# Patient Record
Sex: Female | Born: 1945 | Race: White | Hispanic: No | Marital: Married | Smoking: Former smoker
Health system: Southern US, Community
[De-identification: ages and names within clinical notes are randomized; demographics above are authoritative.]

## PROBLEM LIST (undated history)

## (undated) DIAGNOSIS — U071 COVID-19: Secondary | ICD-10-CM

## (undated) DIAGNOSIS — Z8489 Family history of other specified conditions: Secondary | ICD-10-CM

## (undated) DIAGNOSIS — R079 Chest pain, unspecified: Secondary | ICD-10-CM

## (undated) DIAGNOSIS — I10 Essential (primary) hypertension: Secondary | ICD-10-CM

## (undated) DIAGNOSIS — T7840XA Allergy, unspecified, initial encounter: Secondary | ICD-10-CM

## (undated) DIAGNOSIS — M1990 Unspecified osteoarthritis, unspecified site: Secondary | ICD-10-CM

## (undated) DIAGNOSIS — I351 Nonrheumatic aortic (valve) insufficiency: Secondary | ICD-10-CM

## (undated) DIAGNOSIS — R011 Cardiac murmur, unspecified: Secondary | ICD-10-CM

## (undated) DIAGNOSIS — F419 Anxiety disorder, unspecified: Secondary | ICD-10-CM

## (undated) DIAGNOSIS — F32A Depression, unspecified: Secondary | ICD-10-CM

## (undated) DIAGNOSIS — E785 Hyperlipidemia, unspecified: Secondary | ICD-10-CM

## (undated) DIAGNOSIS — K635 Polyp of colon: Secondary | ICD-10-CM

## (undated) DIAGNOSIS — K649 Unspecified hemorrhoids: Secondary | ICD-10-CM

## (undated) DIAGNOSIS — K219 Gastro-esophageal reflux disease without esophagitis: Secondary | ICD-10-CM

## (undated) DIAGNOSIS — F329 Major depressive disorder, single episode, unspecified: Secondary | ICD-10-CM

## (undated) HISTORY — DX: Depression, unspecified: F32.A

## (undated) HISTORY — DX: Essential (primary) hypertension: I10

## (undated) HISTORY — DX: Gastro-esophageal reflux disease without esophagitis: K21.9

## (undated) HISTORY — DX: COVID-19: U07.1

## (undated) HISTORY — DX: Unspecified hemorrhoids: K64.9

## (undated) HISTORY — PX: TONSILLECTOMY AND ADENOIDECTOMY: SUR1326

## (undated) HISTORY — PX: TUBAL LIGATION: SHX77

## (undated) HISTORY — DX: Major depressive disorder, single episode, unspecified: F32.9

## (undated) HISTORY — DX: Anxiety disorder, unspecified: F41.9

## (undated) HISTORY — DX: Polyp of colon: K63.5

## (undated) HISTORY — DX: Chest pain, unspecified: R07.9

## (undated) HISTORY — PX: COLONOSCOPY: SHX174

## (undated) HISTORY — DX: Cardiac murmur, unspecified: R01.1

## (undated) HISTORY — DX: Allergy, unspecified, initial encounter: T78.40XA

## (undated) HISTORY — DX: Hyperlipidemia, unspecified: E78.5

## (undated) HISTORY — DX: Nonrheumatic aortic (valve) insufficiency: I35.1

## (undated) HISTORY — PX: POLYPECTOMY: SHX149

---

## 1998-10-07 ENCOUNTER — Ambulatory Visit (HOSPITAL_COMMUNITY): Admission: RE | Admit: 1998-10-07 | Discharge: 1998-10-07 | Payer: Self-pay | Admitting: *Deleted

## 1999-07-13 ENCOUNTER — Ambulatory Visit (HOSPITAL_COMMUNITY): Admission: RE | Admit: 1999-07-13 | Discharge: 1999-07-13 | Payer: Self-pay | Admitting: Internal Medicine

## 1999-08-02 ENCOUNTER — Encounter (INDEPENDENT_AMBULATORY_CARE_PROVIDER_SITE_OTHER): Payer: Self-pay

## 1999-08-02 ENCOUNTER — Ambulatory Visit (HOSPITAL_COMMUNITY): Admission: RE | Admit: 1999-08-02 | Discharge: 1999-08-02 | Payer: Self-pay | Admitting: Gastroenterology

## 2000-04-12 ENCOUNTER — Other Ambulatory Visit: Admission: RE | Admit: 2000-04-12 | Discharge: 2000-04-12 | Payer: Self-pay | Admitting: *Deleted

## 2001-02-13 ENCOUNTER — Ambulatory Visit (HOSPITAL_COMMUNITY): Admission: RE | Admit: 2001-02-13 | Discharge: 2001-02-13 | Payer: Self-pay | Admitting: *Deleted

## 2001-06-05 ENCOUNTER — Other Ambulatory Visit: Admission: RE | Admit: 2001-06-05 | Discharge: 2001-06-05 | Payer: Self-pay | Admitting: *Deleted

## 2002-09-28 ENCOUNTER — Encounter: Payer: Self-pay | Admitting: Gastroenterology

## 2003-02-25 ENCOUNTER — Other Ambulatory Visit: Admission: RE | Admit: 2003-02-25 | Discharge: 2003-02-25 | Payer: Self-pay | Admitting: Obstetrics and Gynecology

## 2004-03-28 ENCOUNTER — Ambulatory Visit (HOSPITAL_COMMUNITY): Admission: RE | Admit: 2004-03-28 | Discharge: 2004-03-28 | Payer: Self-pay | Admitting: *Deleted

## 2004-05-11 ENCOUNTER — Ambulatory Visit (HOSPITAL_BASED_OUTPATIENT_CLINIC_OR_DEPARTMENT_OTHER): Admission: RE | Admit: 2004-05-11 | Discharge: 2004-05-11 | Payer: Self-pay | Admitting: Orthopedic Surgery

## 2004-05-11 ENCOUNTER — Ambulatory Visit (HOSPITAL_COMMUNITY): Admission: RE | Admit: 2004-05-11 | Discharge: 2004-05-11 | Payer: Self-pay | Admitting: Orthopedic Surgery

## 2005-01-10 ENCOUNTER — Encounter: Admission: RE | Admit: 2005-01-10 | Discharge: 2005-01-10 | Payer: Self-pay | Admitting: Internal Medicine

## 2005-04-23 ENCOUNTER — Ambulatory Visit (HOSPITAL_COMMUNITY): Admission: RE | Admit: 2005-04-23 | Discharge: 2005-04-23 | Payer: Self-pay | Admitting: *Deleted

## 2008-12-06 DIAGNOSIS — J329 Chronic sinusitis, unspecified: Secondary | ICD-10-CM | POA: Insufficient documentation

## 2008-12-14 ENCOUNTER — Ambulatory Visit: Payer: Self-pay | Admitting: Gastroenterology

## 2008-12-14 DIAGNOSIS — K219 Gastro-esophageal reflux disease without esophagitis: Secondary | ICD-10-CM | POA: Insufficient documentation

## 2008-12-14 DIAGNOSIS — R1013 Epigastric pain: Secondary | ICD-10-CM

## 2008-12-14 DIAGNOSIS — K3189 Other diseases of stomach and duodenum: Secondary | ICD-10-CM | POA: Insufficient documentation

## 2008-12-15 ENCOUNTER — Encounter (INDEPENDENT_AMBULATORY_CARE_PROVIDER_SITE_OTHER): Payer: Self-pay | Admitting: *Deleted

## 2009-01-06 ENCOUNTER — Telehealth: Payer: Self-pay | Admitting: Gastroenterology

## 2009-01-13 ENCOUNTER — Ambulatory Visit: Payer: Self-pay | Admitting: Gastroenterology

## 2009-01-13 ENCOUNTER — Ambulatory Visit (HOSPITAL_COMMUNITY): Admission: RE | Admit: 2009-01-13 | Discharge: 2009-01-13 | Payer: Self-pay | Admitting: Gastroenterology

## 2009-01-14 ENCOUNTER — Telehealth: Payer: Self-pay | Admitting: Gastroenterology

## 2009-07-14 ENCOUNTER — Encounter: Payer: Self-pay | Admitting: Gastroenterology

## 2010-06-22 ENCOUNTER — Encounter: Admission: RE | Admit: 2010-06-22 | Discharge: 2010-06-22 | Payer: Self-pay | Admitting: Internal Medicine

## 2010-07-03 ENCOUNTER — Ambulatory Visit: Payer: Self-pay | Admitting: Gastroenterology

## 2010-07-03 DIAGNOSIS — R197 Diarrhea, unspecified: Secondary | ICD-10-CM | POA: Insufficient documentation

## 2010-07-24 ENCOUNTER — Telehealth: Payer: Self-pay | Admitting: Gastroenterology

## 2010-08-29 ENCOUNTER — Telehealth: Payer: Self-pay | Admitting: Gastroenterology

## 2010-08-30 DIAGNOSIS — K222 Esophageal obstruction: Secondary | ICD-10-CM | POA: Insufficient documentation

## 2010-09-01 ENCOUNTER — Encounter: Payer: Self-pay | Admitting: Gastroenterology

## 2010-10-10 NOTE — Progress Notes (Signed)
Summary: Dexilant rx  Phone Note Outgoing Call   Call placed by: Joselyn Glassman,  July 24, 2010 4:30 PM Call placed to: Patient Summary of Call: Called the pt at [phone] and LM for her to call us back.   Initial call taken by: Joselyn Glassman,  July 24, 2010 4:30 PM  Follow-up for Phone Call        Called pt back, L/M for pt to return call Follow-up by: Merri Ray CMA Duncan Dull),  July 25, 2010 8:32 AM  Additional Follow-up for Phone Call Additional follow up Details #1::        Pt called back, states that Dexilant works well and wants rx sent to her pharmacy.  Additional Follow-up by: Merri Ray CMA Duncan Dull),  July 25, 2010 2:45 PM    New/Updated Medications: DEXILANT 60 MG CPDR (DEXLANSOPRAZOLE) 1 by mouth once daily Prescriptions: DEXILANT 60 MG CPDR (DEXLANSOPRAZOLE) 1 by mouth once daily  #30 x 3   Entered by:   Merri Ray CMA (AAMA)   Authorized by:   Louis Meckel MD   Signed by:   Merri Ray CMA (AAMA) on 07/25/2010   Method used:   Electronically to        [id number] (retail)       [address]       [place], [place]  [address]       Ph: [phone]       Fax: [phone]   RxID:   [id number]

## 2010-10-10 NOTE — Assessment & Plan Note (Signed)
Summary: diarrhea--ch.   History of Present Illness Visit Type: Follow-up Visit Primary GI MD: Melvia Heaps MD [facility] Primary Provider: Nila Nephew, MD Requesting Provider: Nila Nephew, MD Chief Complaint: acid reflux worse when lying down, loss of appetite, belching, gas and diarrhea daily History of Present Illness:   Erika Wu is a pleasant 65 year old Hispanic female referred at the request of Dr. Chilton Si for evaluation of dyspepsia.  She complains of frequent excess belching  accompanied  by abdominal distention, early satiety,  excess flatus and loose stools.  She characteristically has a loose stool first thing in the morning followed by several low-volume semisolid stools.  Symptoms have occurred over the past 3 months.  Weight is stable.  She is without melena or hematochezia.  A distal esophageal stricture was dilated in May, 2010.  A hyperplastic polyp was removed at colonoscopy in 2004.  She takes lexapro and Nexium and complains of excess phlegm that she coughs up after several hours of sleep.  She denies regurgitation of gastric contents.  She has mild nausea.   GI Review of Systems    Reports abdominal pain, belching, and  bloating.      Denies acid reflux, chest pain, dysphagia with liquids, dysphagia with solids, heartburn, loss of appetite, nausea, vomiting, vomiting blood, weight loss, and  weight gain.      Reports change in bowel habits and  diarrhea.     Denies anal fissure, black tarry stools, constipation, diverticulosis, fecal incontinence, heme positive stool, hemorrhoids, irritable bowel syndrome, jaundice, light color stool, liver problems, rectal bleeding, and  rectal pain.    Current Medications (verified): 1)  Lexapro 10 Mg Tabs (Escitalopram Oxalate) .... Take 1 Tablet By Mouth Once A Day 2)  Nexium 40 Mg Cpdr (Esomeprazole Magnesium) .Marland Kitchen.. 1 Tablet By Mouth Once Daily  Allergies (verified): No Known Drug Allergies  Past History:  Past Medical  History: Reviewed history from 12/14/2008 and no changes required. Current Problems:  HEMORRHOIDS (ICD-455.6) COLONIC POLYPS (ICD-211.3) DUE FOR F/U 2014,HYPERPLASTIC POLYP Depression GERD  Past Surgical History: Reviewed history from 12/14/2008 and no changes required. Tonsillectomy and Addenoidectomy  Family History: Reviewed history from 12/14/2008 and no changes required. Family History of Colon Cancer: MGM Family History of Heart Disease: Paternal side of family  Social History: Reviewed history from 12/14/2008 and no changes required. Seperated, 1 girl, 1 boy Second Grade Teacher, [employer] Travel History: [place] in January Patient is a former smoker. Quit in 1998 Alcohol Use - yes occasional Daily Caffeine Use 1 cup coffee Illicit Drug Use - no Patient gets regular exercise.  Review of Systems       The patient complains of back pain, cough, sleeping problems, and voice change.  The patient denies allergy/sinus, anemia, anxiety-new, arthritis/joint pain, blood in urine, breast changes/lumps, change in vision, confusion, coughing up blood, depression-new, fainting, fatigue, fever, headaches-new, hearing problems, heart murmur, heart rhythm changes, itching, menstrual pain, muscle pains/cramps, night sweats, nosebleeds, pregnancy symptoms, shortness of breath, skin rash, sore throat, swelling of feet/legs, swollen lymph glands, thirst - excessive , urination - excessive , urination changes/pain, urine leakage, and vision changes.         All other systems were reviewed and were negative   Vital Signs:  Patient profile:   65 year old female Height:      63 inches Weight:      133 pounds BMI:     23.65 Pulse rate:   68 / minute Pulse rhythm:  regular BP sitting:   142 / 78  (left arm)  Vitals Entered By: Erika Wu NCMA (July 03, 2010 2:29 PM)  Physical Exam  Additional Exam:  On physical exam she is a well-developed well-nourished  female  skin: anicteric HEENT: normocephalic; PEERLA; no nasal or pharyngeal abnormalities neck: supple nodes: no cervical lymphadenopathy chest: clear to ausculatation and percussion heart: no murmurs, gallops, or rubs abd: soft, nontender; BS normoactive; no abdominal masses, tenderness, organomegaly; there is no succussion splash rectal: deferred ext: no cynanosis, clubbing, edema skeletal: no deformities neuro: oriented x 3; no focal abnormalities    Impression & Recommendations:  Problem # 1:  DYSPEPSIA (ICD-536.8) Symptoms are nonspecific but could be related to her medications despite the fact that she has been on both medications for over 6 months.  Gastroparesis is also a possibility.  Recommendations #1 empirically change medications from Nexium to [facility] #2 to consider gastric emptying scan pending results of #1  Problem # 2:  DIARRHEA (ICD-787.91) Symptoms could be related to assessment #1 which would include a medication effect.  Recommendations #1 switch PPI from Nexium to [facility] and reevaluate in one week  Patient Instructions: 1)  Copy sent to : Nila Nephew, MD 2)  Use Dexilant and call us back in 1 week for progress 3)  The medication list was reviewed and reconciled.  All changed / newly prescribed medications were explained.  A complete medication list was provided to the patient / caregiver.

## 2010-10-12 NOTE — Medication Information (Signed)
Summary: Prior Auth/[employer]  Prior Auth/[employer]   Imported By: Lester [place] 09/14/2010 10:02:47  _____________________________________________________________________  External Attachment:    Type:   Image     Comment:   External Document

## 2010-10-12 NOTE — Medication Information (Signed)
Summary: Approval/[employer]  Approval/[employer]   Imported By: Lester [place] 09/14/2010 10:00:31  _____________________________________________________________________  External Attachment:    Type:   Image     Comment:   External Document

## 2010-10-12 NOTE — Progress Notes (Signed)
Summary: Medication  Phone Note Call from Patient Call back at Home Phone [phone]   Caller: Patient Call For: Dr. Arlyce Dice Reason for Call: Talk to Nurse Summary of Call: Pt states that pharmacy was supposed to send [place] an authorization form to fill out for her Dexilant, she is currently out of medication and would like samples to last untill we can get her refill authorized Initial call taken by: [place],  August 29, 2010 2:16 PM  Follow-up for Phone Call        Informed pt that we have not recieved  prior authorization. Will leave samples for pt out front. Pt is to contact her pharmacy about prior authorization fax form Follow-up by: Merri Ray CMA Duncan Dull),  August 29, 2010 3:30 PM     Appended Document: Medication    Clinical Lists Changes  Problems: Added new problem of ESOPHAGEAL STRICTURE (ICD-530.3)      Appended Document: Medication Faxed form to [employer] today  Appended Document: Medication pt approved for Dexilant from 08/11/2010 to 09/01/2011

## 2011-04-01 ENCOUNTER — Other Ambulatory Visit: Payer: Self-pay | Admitting: Gastroenterology

## 2011-05-30 ENCOUNTER — Other Ambulatory Visit: Payer: Self-pay | Admitting: Gastroenterology

## 2011-06-07 ENCOUNTER — Other Ambulatory Visit (HOSPITAL_COMMUNITY): Payer: Self-pay | Admitting: Obstetrics & Gynecology

## 2011-06-07 DIAGNOSIS — M8580 Other specified disorders of bone density and structure, unspecified site: Secondary | ICD-10-CM

## 2011-06-07 DIAGNOSIS — Z1231 Encounter for screening mammogram for malignant neoplasm of breast: Secondary | ICD-10-CM

## 2011-06-25 ENCOUNTER — Ambulatory Visit (HOSPITAL_COMMUNITY)
Admission: RE | Admit: 2011-06-25 | Discharge: 2011-06-25 | Disposition: A | Discharge status: Home / Self Care | Payer: BC Managed Care – PPO | Source: Ambulatory Visit | Attending: Obstetrics & Gynecology | Admitting: Obstetrics & Gynecology

## 2011-06-25 DIAGNOSIS — Z780 Asymptomatic menopausal state: Secondary | ICD-10-CM | POA: Insufficient documentation

## 2011-06-25 DIAGNOSIS — Z1231 Encounter for screening mammogram for malignant neoplasm of breast: Secondary | ICD-10-CM

## 2011-06-25 DIAGNOSIS — M8580 Other specified disorders of bone density and structure, unspecified site: Secondary | ICD-10-CM

## 2011-06-25 DIAGNOSIS — Z13820 Encounter for screening for osteoporosis: Secondary | ICD-10-CM | POA: Insufficient documentation

## 2011-09-21 ENCOUNTER — Ambulatory Visit
Admission: RE | Admit: 2011-09-21 | Discharge: 2011-09-21 | Disposition: A | Discharge status: Home / Self Care | Payer: BC Managed Care – PPO | Source: Ambulatory Visit | Attending: Internal Medicine | Admitting: Internal Medicine

## 2011-09-21 ENCOUNTER — Other Ambulatory Visit: Payer: Self-pay | Admitting: Internal Medicine

## 2011-09-21 DIAGNOSIS — M79673 Pain in unspecified foot: Secondary | ICD-10-CM

## 2011-10-22 ENCOUNTER — Other Ambulatory Visit: Payer: Self-pay | Admitting: Gastroenterology

## 2011-10-22 NOTE — Telephone Encounter (Signed)
Dexilant approved  From 10-05-2011 till 10-21-2012  Case number is [id number] Called pt to inform

## 2011-10-24 ENCOUNTER — Telehealth: Payer: Self-pay | Admitting: Gastroenterology

## 2011-10-24 NOTE — Telephone Encounter (Signed)
Medication already approved.

## 2012-01-11 ENCOUNTER — Telehealth: Payer: Self-pay | Admitting: Gastroenterology

## 2012-01-11 NOTE — Telephone Encounter (Signed)
Prior auth approved for Dexilant 60mg  until 01/10/2013

## 2012-01-30 ENCOUNTER — Other Ambulatory Visit: Payer: Self-pay | Admitting: Dermatology

## 2013-09-17 ENCOUNTER — Encounter: Payer: Self-pay | Admitting: Gastroenterology

## 2013-10-20 ENCOUNTER — Ambulatory Visit (INDEPENDENT_AMBULATORY_CARE_PROVIDER_SITE_OTHER): Payer: Medicare Other | Admitting: Gastroenterology

## 2013-10-20 ENCOUNTER — Encounter: Payer: Self-pay | Admitting: Gastroenterology

## 2013-10-20 VITALS — BP 120/70 | HR 64 | Ht 62.0 in | Wt 142.1 lb

## 2013-10-20 DIAGNOSIS — R197 Diarrhea, unspecified: Secondary | ICD-10-CM

## 2013-10-20 DIAGNOSIS — R011 Cardiac murmur, unspecified: Secondary | ICD-10-CM | POA: Insufficient documentation

## 2013-10-20 DIAGNOSIS — R1013 Epigastric pain: Secondary | ICD-10-CM

## 2013-10-20 DIAGNOSIS — Z860100 Personal history of colon polyps, unspecified: Secondary | ICD-10-CM | POA: Insufficient documentation

## 2013-10-20 DIAGNOSIS — K648 Other hemorrhoids: Secondary | ICD-10-CM

## 2013-10-20 DIAGNOSIS — K219 Gastro-esophageal reflux disease without esophagitis: Secondary | ICD-10-CM

## 2013-10-20 DIAGNOSIS — K3189 Other diseases of stomach and duodenum: Secondary | ICD-10-CM

## 2013-10-20 NOTE — Assessment & Plan Note (Signed)
Diastolic murmur suggestive of aortic insufficiency.  Recommendations #1 echocardiogram #2 I will send a note to Dr. Nyoka Cowden for further evaluation

## 2013-10-20 NOTE — Assessment & Plan Note (Signed)
The patient's dyspeptic symptoms could be do to ulcer or nonulcer dyspepsia.  Medication effect (dexilant) is possible.  Symptoms from chronic cholecystitis is less likely.  She may be maldigesting.  Recommendations #1 DC dexilant #2 trial of Nexium 40 mg daily #3 upper endoscopy

## 2013-10-20 NOTE — Assessment & Plan Note (Signed)
Plan followup colonoscopy 

## 2013-10-20 NOTE — Progress Notes (Signed)
_                                                                                                                History of Present Illness: 68 year old white female referred for several GI complaints including bloating, excess gas and belching.  She has a history of an esophageal of GERD for which she is taking dexilant.  She denies dysphagia.  With eating almost any food she has bloating, excess belching and gas.  She's also having frequent poorly formed stools.  She denies rectal bleeding.  When she bends over she has some discomfort in her mid epigastrium that is relieved with standing upright.  She complains of rectal discomfort and occasional blood on the toilet tissue.  Adenomatous polyps were removed in 2000.  Last colonoscopy 2004 demonstrated a diminutive polyp that was removed.  Pathology is not available.  She underwent upper endoscopy with dilation of a distal esophageal stricture and 2010.    Past Medical History  Diagnosis Date  . Anxiety   . Depression   . Colon polyps   . GERD (gastroesophageal reflux disease)    Past Surgical History  Procedure Laterality Date  . Tonsillectomy and adenoidectomy    . Tubal ligation     family history includes Colon cancer in her maternal grandmother; Diverticulosis in her maternal grandfather; Heart attack in her father; Heart disease in her father; Stomach cancer in her maternal grandmother. Current Outpatient Prescriptions  Medication Sig Dispense Refill  . clonazePAM (KLONOPIN) 0.5 MG tablet Take 0.5 mg by mouth at bedtime.       Marland Kitchen DEXILANT 60 MG capsule TAKE ONE (1) CAPSULE(S) ONCE DAILY  30 capsule  0  . escitalopram (LEXAPRO) 20 MG tablet Take 20 mg by mouth daily.        No current facility-administered medications for this visit.   Allergies as of 10/20/2013  . (No Known Allergies)    reports that she has been smoking Cigarettes.  She has been smoking about 0.00 packs per day. She has never used smokeless  tobacco. She reports that she drinks alcohol. She reports that she does not use illicit drugs.     Review of Systems: Pertinent positive and negative review of systems were noted in the above HPI section. All other review of systems were otherwise negative.  Vital signs were reviewed in today's medical record Physical Exam: General: Well developed , well nourished, no acute distress Skin: anicteric Head: Normocephalic and atraumatic Eyes:  sclerae anicteric, EOMI Ears: Normal auditory acuity Mouth: No deformity or lesions Neck: Supple, no masses or thyromegaly Lungs: Clear throughout to auscultation Heart: Regular rate and rhythm;  rubs or bruits.  There is a 5-6/8 early diastolic murmur heard best at the second right intercostal space Abdomen: Soft, non tender and non distended. No masses, hepatosplenomegaly or hernias noted. Normal Bowel sounds.  There is no succussion splash Rectal: Grade 3 hemorrhoids are present Musculoskeletal: Symmetrical with no gross deformities  Skin: No lesions on visible extremities  Pulses:  Normal pulses noted Extremities: No clubbing, cyanosis, edema or deformities noted Neurological: Alert oriented x 4, grossly nonfocal Cervical Nodes:  No significant cervical adenopathy Inguinal Nodes: No significant inguinal adenopathy Psychological:  Alert and cooperative. Normal mood and affect  See Assessment and Plan under Problem List

## 2013-10-20 NOTE — Assessment & Plan Note (Signed)
Diarrhea may be reflective of mild maldigestion or perhaps related to dexilant  Recommendations #1 fiber supplementation #2 reassess following switched to Nexium

## 2013-10-20 NOTE — Assessment & Plan Note (Signed)
Excess belching and bloating may be reflective of GERD despite dexilant.  Recommendations #1 trial of Nexium

## 2013-10-20 NOTE — Assessment & Plan Note (Signed)
Patient has symptomatic grade 3 hemorrhoids.  Plan band ligation once her other GI issues have been addressed and resolved

## 2013-10-20 NOTE — Patient Instructions (Addendum)
You have been given a separate informational sheet regarding your tobacco use, the importance of quitting and local resources to help you quit.  Ill will contact you with your Echo appointment

## 2013-10-21 ENCOUNTER — Encounter: Payer: Self-pay | Admitting: Cardiology

## 2013-10-21 ENCOUNTER — Ambulatory Visit (HOSPITAL_COMMUNITY): Payer: Medicare Other | Attending: Cardiology | Admitting: Radiology

## 2013-10-21 ENCOUNTER — Other Ambulatory Visit (HOSPITAL_COMMUNITY): Payer: Self-pay | Admitting: Radiology

## 2013-10-21 DIAGNOSIS — R011 Cardiac murmur, unspecified: Secondary | ICD-10-CM

## 2013-10-21 NOTE — Progress Notes (Signed)
Echocardiogram performed.  

## 2013-10-23 ENCOUNTER — Telehealth: Payer: Self-pay | Admitting: Gastroenterology

## 2013-10-23 NOTE — Telephone Encounter (Signed)
Pt calling for echocardiogram results. Please advise.

## 2013-10-23 NOTE — Telephone Encounter (Signed)
She has a bicuspid aortic valve.  I have notified Dr. Nyoka Cowden.  He will decide whether any further workup is required. I suggest that she contact his office.

## 2013-10-26 NOTE — Telephone Encounter (Signed)
Pt aware and knows to contact Dr. Rolly Salter office.

## 2013-11-09 ENCOUNTER — Telehealth: Payer: Self-pay | Admitting: Gastroenterology

## 2013-11-09 MED ORDER — PEG-KCL-NACL-NASULF-NA ASC-C 100 G PO SOLR: 1.0000 | Freq: Once | ORAL | Status: DC

## 2013-11-09 NOTE — Telephone Encounter (Signed)
Called and answered all questions by patient  Told pt to call back as needed

## 2013-11-11 ENCOUNTER — Ambulatory Visit (AMBULATORY_SURGERY_CENTER): Payer: Medicare Other | Admitting: Gastroenterology

## 2013-11-11 ENCOUNTER — Encounter: Payer: Self-pay | Admitting: Gastroenterology

## 2013-11-11 VITALS — BP 151/79 | HR 68 | Temp 97.8°F | Resp 19 | Ht 62.0 in | Wt 142.0 lb

## 2013-11-11 DIAGNOSIS — D126 Benign neoplasm of colon, unspecified: Secondary | ICD-10-CM

## 2013-11-11 DIAGNOSIS — K648 Other hemorrhoids: Secondary | ICD-10-CM

## 2013-11-11 DIAGNOSIS — Z86010 Personal history of colonic polyps: Secondary | ICD-10-CM

## 2013-11-11 DIAGNOSIS — K219 Gastro-esophageal reflux disease without esophagitis: Secondary | ICD-10-CM

## 2013-11-11 DIAGNOSIS — D131 Benign neoplasm of stomach: Secondary | ICD-10-CM

## 2013-11-11 MED ORDER — HYOSCYAMINE SULFATE 0.125 MG SL SUBL: 0.2500 mg | SUBLINGUAL_TABLET | SUBLINGUAL | Status: DC | PRN

## 2013-11-11 MED ORDER — SODIUM CHLORIDE 0.9 % IV SOLN: 500.0000 mL | INTRAVENOUS | Status: DC

## 2013-11-11 NOTE — Op Note (Signed)
[place]  [address] [place], [address]   COLONOSCOPY PROCEDURE REPORT  PATIENT: Roman, Sandall  MR#: [id number] BIRTHDATE: Oct 27, 1945 , 77  yrs. old GENDER: Female ENDOSCOPIST: Inda Castle, MD REFERRED [id number] Nyoka Cowden, M.D. PROCEDURE DATE:  11/11/2013 PROCEDURE:   Colonoscopy with cold biopsy polypectomy First Screening Colonoscopy - Avg.  risk and is 50 yrs.  old or older - No.  Prior Negative Screening - Now for repeat screening. N/A  History of Adenoma - Now for follow-up colonoscopy & has been > or = to 3 yrs.  Yes hx of adenoma.  Has been 3 or more years since last colonoscopy.  Polyps Removed Today? Yes. ASA CLASS:   Class II INDICATIONS:Patient's personal history of adenomatous colon polyps. 2000,  2004 MEDICATIONS: MAC sedation, administered by CRNA and propofol (Diprivan) 150mg  IV  DESCRIPTION OF PROCEDURE:   After the risks benefits and alternatives of the procedure were thoroughly explained, informed consent was obtained.  A digital rectal exam revealed internal hemorrhoids.   The LB [id number]  endoscope was introduced through the anus and advanced to the cecum, which was identified by both the appendix and ileocecal valve. No adverse events experienced.   The quality of the prep was excellent using Suprep The instrument was then slowly withdrawn as the colon was fully examined.      COLON FINDINGS: A sessile polyp measuring 2 mm in size was found in the descending colon.  A polypectomy was performed with cold forceps.   Internal hemorrhoids were found.   The colon was otherwise normal.  There was no diverticulosis, inflammation, polyps or cancers unless previously stated.  Retroflexed views revealed no abnormalities. The time to cecum=5 minutes 06 seconds. Withdrawal time=7 minutes 15 seconds.  The scope was withdrawn and the procedure completed. COMPLICATIONS: There were no complications.  ENDOSCOPIC IMPRESSION: 1.    Sessile polyp measuring 2 mm in size was found in the descending colon; polypectomy was performed with cold forceps 2.   Internal hemorrhoids 3.   The colon was otherwise normal  RECOMMENDATIONS: 1.  If the polyp(s) removed today are proven to be adenomatous (pre-cancerous) polyps, you will need a repeat colonoscopy in 5 years.  Otherwise you should continue to follow colorectal cancer screening guidelines for "routine risk" patients with colonoscopy in 10 years.  You will receive a letter within 1-2 weeks with the results of your biopsy as well as final recommendations.  Please call my office if you have not received a letter after 3 weeks. 2.  band ligation of internal hemorrhoids   eSigned:  Inda Castle, MD 11/11/2013 3:18 PM   cc:   PATIENT NAME:  Danilynn, Jemison MR#: [id number]

## 2013-11-11 NOTE — Progress Notes (Signed)
Lidocaine-40mg IV prior to Propofol InductionPropofol given over incremental dosages 

## 2013-11-11 NOTE — Op Note (Signed)
[place]  [address] [place], [address]   ENDOSCOPY PROCEDURE REPORT  PATIENT: Adonai, Helzer  MR#: [id number] BIRTHDATE: 1946-06-27 , 55  yrs. old GENDER: Female ENDOSCOPIST: Inda Castle, MD REFERRED BY:  Levin Erp, M.D. PROCEDURE DATE:  11/11/2013 PROCEDURE:  EGD, diagnostic ASA CLASS:     Class II INDICATIONS:  Dyspepsia. MEDICATIONS: There was residual sedation effect present from prior procedure, MAC sedation, administered by CRNA, propofol (Diprivan) 100mg  IV, and Simethicone 0.6cc PO TOPICAL ANESTHETIC: Cetacaine Spray  DESCRIPTION OF PROCEDURE: After the risks benefits and alternatives of the procedure were thoroughly explained, informed consent was obtained.  The LB [id number] endoscope was introduced through the mouth and advanced to the third portion of the duodenum. Without limitations.  The instrument was slowly withdrawn as the mucosa was fully examined.      In the gastric fundus there were multiple typical fundic appearing polyps measuring 2-6 mm.   The remainder of the upper endoscopy exam was otherwise normal.  Retroflexed views revealed no abnormalities.     The scope was then withdrawn from the patient and the procedure completed.  COMPLICATIONS: There were no complications. ENDOSCOPIC IMPRESSION: 1.   In the gastric fundus there were multiple typical fundic appearing polyps measuring 2-6 mm. 2.   The remainder of the upper endoscopy exam was otherwise normal  RECOMMENDATIONS: 1.  hold dexilant 2.  hyomax when necessary 3.  office visit 3-4 weeks REPEAT EXAM:  eSigned:  Inda Castle, MD 11/11/2013 3:23 PM   CC:

## 2013-11-11 NOTE — Patient Instructions (Addendum)
YOU HAD AN ENDOSCOPIC PROCEDURE TODAY AT THE [facility]: Refer to the procedure report that was given to you for any specific questions about what was found during the examination.  If the procedure report does not answer your questions, please call your gastroenterologist to clarify.  If you requested that your care partner not be given the details of your procedure findings, then the procedure report has been included in a sealed envelope for you to review at your convenience later.  YOU SHOULD EXPECT: Some feelings of bloating in the abdomen. Passage of more gas than usual.  Walking can help get rid of the air that was put into your GI tract during the procedure and reduce the bloating. If you had a lower endoscopy (such as a colonoscopy or flexible sigmoidoscopy) you may notice spotting of blood in your stool or on the toilet paper. If you underwent a bowel prep for your procedure, then you may not have a normal bowel movement for a few days.  DIET: Your first meal following the procedure should be a light meal and then it is ok to progress to your normal diet.  A half-sandwich or bowl of soup is an example of a good first meal.  Heavy or fried foods are harder to digest and may make you feel nauseous or bloated.  Likewise meals heavy in dairy and vegetables can cause extra gas to form and this can also increase the bloating.  Drink plenty of fluids but you should avoid alcoholic beverages for 24 hours.  ACTIVITY: Your care partner should take you home directly after the procedure.  You should plan to take it easy, moving slowly for the rest of the day.  You can resume normal activity the day after the procedure however you should NOT DRIVE or use heavy machinery for 24 hours (because of the sedation medicines used during the test).    SYMPTOMS TO REPORT IMMEDIATELY: A gastroenterologist can be reached at any hour.  During normal business hours, 8:30 AM to 5:00 PM Monday through Friday,  call [phone].  After hours and on weekends, please call the GI answering service at [phone] who will take a message and have the physician on call contact you.   Following lower endoscopy (colonoscopy or flexible sigmoidoscopy):  Excessive amounts of blood in the stool  Significant tenderness or worsening of abdominal pains  Swelling of the abdomen that is new, acute  Fever of 100F or higher  Following upper endoscopy (EGD)  Vomiting of blood or coffee ground material  New chest pain or pain under the shoulder blades  Painful or persistently difficult swallowing  New shortness of breath  Fever of 100F or higher  Black, tarry-looking stools  FOLLOW UP: If any biopsies were taken you will be contacted by phone or by letter within the next 1-3 weeks.  Call your gastroenterologist if you have not heard about the biopsies in 3 weeks.  Our staff will call the home number listed on your records the next business day following your procedure to check on you and address any questions or concerns that you may have at that time regarding the information given to you following your procedure. This is a courtesy call and so if there is no answer at the home number and we have not heard from you through the emergency physician on call, we will assume that you have returned to your regular daily activities without incident.  SIGNATURES/CONFIDENTIALITY: You and/or your care   partner have signed paperwork which will be entered into your electronic medical record.  These signatures attest to the fact that that the information above on your After Visit Summary has been reviewed and is understood.  Full responsibility of the confidentiality of this discharge information lies with you and/or your care-partner.  Polyps, hemorrhoids,banding-handouts given  Repeat colonoscopy will be determined by pathology.  Hold dexilant, use hyomax when needed.  Office visit in 3-4 weeks.

## 2013-11-11 NOTE — Progress Notes (Signed)
Called to room to assist during endoscopic procedure.  Patient ID and intended procedure confirmed with present staff. Received instructions for my participation in the procedure from the performing physician.  

## 2013-11-12 ENCOUNTER — Telehealth: Payer: Self-pay | Admitting: *Deleted

## 2013-11-12 NOTE — Telephone Encounter (Signed)
  Follow up Call-  Call back number 11/11/2013  Post procedure Call Back phone  # [phone]  Permission to leave phone message Yes    [facility]

## 2013-11-16 ENCOUNTER — Encounter: Payer: Self-pay | Admitting: Gastroenterology

## 2013-11-17 ENCOUNTER — Telehealth: Payer: Self-pay | Admitting: Gastroenterology

## 2013-11-17 MED ORDER — PANTOPRAZOLE SODIUM 40 MG PO TBEC: 40.0000 mg | DELAYED_RELEASE_TABLET | Freq: Two times a day (BID) | ORAL | Status: DC

## 2013-11-17 NOTE — Telephone Encounter (Signed)
She can start Protonix twice a day. Bloating and diarrhea better since holding dexilant?

## 2013-11-17 NOTE — Telephone Encounter (Signed)
Pt aware and script sent to pharmacy. Pt states the bloating is not much better but the diarrhea has stopped.

## 2013-11-17 NOTE — Telephone Encounter (Signed)
Pt states she had EGD and colon done and was told to hold the dexilant. Pt states she is having bad heartburn and burping again. She states she was given samples of Nexium but she cannot remember to take it before meals, she does not have regular eating schedule. Should pt go back on Dexilant? Please advise.

## 2013-11-24 ENCOUNTER — Ambulatory Visit (INDEPENDENT_AMBULATORY_CARE_PROVIDER_SITE_OTHER): Payer: Medicare Other | Admitting: Gastroenterology

## 2013-11-24 ENCOUNTER — Encounter: Payer: Self-pay | Admitting: Gastroenterology

## 2013-11-24 VITALS — BP 120/68 | HR 80 | Ht 62.0 in | Wt 144.2 lb

## 2013-11-24 DIAGNOSIS — K3189 Other diseases of stomach and duodenum: Secondary | ICD-10-CM

## 2013-11-24 DIAGNOSIS — R1013 Epigastric pain: Principal | ICD-10-CM

## 2013-11-24 DIAGNOSIS — K648 Other hemorrhoids: Secondary | ICD-10-CM

## 2013-11-24 DIAGNOSIS — Z86010 Personal history of colonic polyps: Secondary | ICD-10-CM

## 2013-11-24 NOTE — Assessment & Plan Note (Signed)
Patient continues to have dyspeptic symptoms primarily consisting of postprandial bloating and belching.  She seems to have had some response to Protonix.  I've instructed her to use simethicone as needed and to try to separate her drinking from eating.

## 2013-11-24 NOTE — Assessment & Plan Note (Signed)
Followup colonoscopy 2020

## 2013-11-24 NOTE — Patient Instructions (Signed)
Your first banding is scheduled on 01/05/2014 at 9:15am  Simethicone for bloating and gas

## 2013-11-24 NOTE — Assessment & Plan Note (Signed)
Plan band ligation for symptomatic hemorrhoids

## 2013-11-24 NOTE — Progress Notes (Signed)
          History of Present Illness:  Erika Wu continues to complain of immediate postprandial bloating and excess belching.  She is without pain, per se.  Diarrhea has subsided since changing from dexilant to Protonix.  Upper endoscopy was unremarkable (fundic gland polyps only).  A small adenomatous polyp was removed at colonoscopy and internal hemorrhoids were seen.    Review of Systems: Pertinent positive and negative review of systems were noted in the above HPI section. All other review of systems were otherwise negative.    Current Medications, Allergies, Past Medical History, Past Surgical History, Family History and Social History were reviewed in [name] record  Vital signs were reviewed in today's medical record. Physical Exam: General: Well developed , well nourished, no acute distress   See Assessment and Plan under Problem List

## 2014-01-01 ENCOUNTER — Telehealth: Payer: Self-pay | Admitting: Gastroenterology

## 2014-01-01 NOTE — Telephone Encounter (Signed)
Answered all questions.

## 2014-01-05 ENCOUNTER — Ambulatory Visit (INDEPENDENT_AMBULATORY_CARE_PROVIDER_SITE_OTHER): Payer: Medicare Other | Admitting: Gastroenterology

## 2014-01-05 ENCOUNTER — Encounter: Payer: Self-pay | Admitting: Gastroenterology

## 2014-01-05 VITALS — BP 150/66 | HR 60 | Ht 62.5 in | Wt 141.0 lb

## 2014-01-05 DIAGNOSIS — K648 Other hemorrhoids: Secondary | ICD-10-CM

## 2014-01-05 NOTE — Progress Notes (Signed)
PROCEDURE NOTE: The patient presents with symptomatic grade *3**  hemorrhoids, requesting rubber band ligation of his/her hemorrhoidal disease.  All risks, benefits and alternative forms of therapy were described and informed consent was obtained.   The anorectum was pre-medicated with lubricant and nitroglycerine ointment The decision was made to band the *right posterior** internal hemorrhoid, and the [place] was used to perform band ligation without complication.  Digital anorectal examination was then performed to assure proper positioning of the band, and to adjust the banded tissue as required.  The patient was discharged home without pain or other issues.  Dietary and behavioral recommendations were given and along with follow-up instructions.    The patient will return in *2** for  follow-up and possible additional banding as required. No complications were encountered and the patient tolerated the procedure well.

## 2014-01-05 NOTE — Patient Instructions (Signed)
HEMORRHOID BANDING PROCEDURE    FOLLOW-UP CARE   1. The procedure you have had should have been relatively painless since the banding of the area involved does not have nerve endings and there is no pain sensation.  The rubber band cuts off the blood supply to the hemorrhoid and the band may fall off as soon as 48 hours after the banding (the band may occasionally be seen in the toilet bowl following a bowel movement). You may notice a temporary feeling of fullness in the rectum which should respond adequately to plain Tylenol or Motrin.  2. Following the banding, avoid strenuous exercise that evening and resume full activity the next day.  A sitz bath (soaking in a warm tub) or bidet is soothing, and can be useful for cleansing the area after bowel movements.     3. To avoid constipation, take two tablespoons of natural wheat bran, natural oat bran, flax, Benefiber or any over the counter fiber supplement and increase your water intake to 7-8 glasses daily.    4. Unless you have been prescribed anorectal medication, do not put anything inside your rectum for two weeks: No suppositories, enemas, fingers, etc.  5. Occasionally, you may have more bleeding than usual after the banding procedure.  This is often from the untreated hemorrhoids rather than the treated one.  Don't be concerned if there is a tablespoon or so of blood.  If there is more blood than this, lie flat with your bottom higher than your head and apply an ice pack to the area. If the bleeding does not stop within a half an hour or if you feel faint, call our office at [phone] or go to the emergency room.  6. Problems are not common; however, if there is a substantial amount of bleeding, severe pain, chills, fever or difficulty passing urine (very rare) or other problems, you should call us at [phone] or report to the nearest emergency room.  7. Do not stay seated continuously for more than 2-3 hours for a day or two  after the procedure.  Tighten your buttock muscles 10-15 times every two hours and take 10-15 deep breaths every 1-2 hours.  Do not spend more than a few minutes on the toilet if you cannot empty your bowel; instead re-visit the toilet at a later time.  Your #2 banding is scheduled on 03/02/2014 at 8:45am

## 2014-01-06 ENCOUNTER — Telehealth: Payer: Self-pay | Admitting: Gastroenterology

## 2014-01-06 NOTE — Telephone Encounter (Signed)
Faxed back form.

## 2014-01-06 NOTE — Telephone Encounter (Signed)
Called for prior authorization on Protonix   Case ID number is [id number]

## 2014-03-02 ENCOUNTER — Encounter: Payer: Medicare Other | Admitting: Gastroenterology

## 2014-03-29 ENCOUNTER — Encounter: Payer: Self-pay | Admitting: Gastroenterology

## 2014-03-29 ENCOUNTER — Ambulatory Visit (INDEPENDENT_AMBULATORY_CARE_PROVIDER_SITE_OTHER): Payer: Medicare Other | Admitting: Gastroenterology

## 2014-03-29 VITALS — BP 120/72 | HR 68 | Ht 62.0 in | Wt 144.0 lb

## 2014-03-29 DIAGNOSIS — K648 Other hemorrhoids: Secondary | ICD-10-CM

## 2014-03-29 NOTE — Progress Notes (Signed)
PROCEDURE NOTE: The patient presents with symptomatic grade **3*  hemorrhoids, requesting rubber band ligation of his/her hemorrhoidal disease.  All risks, benefits and alternative forms of therapy were described and informed consent was obtained.   The anorectum was pre-medicated with lubricant and nitroglycerine ointment The decision was made to band the **right anterior* internal hemorrhoid, and the [facility] System was used to perform band ligation without complication.  Digital anorectal examination was then performed to assure proper positioning of the band, and to adjust the banded tissue as required.  The patient was discharged home without pain or other issues.  Dietary and behavioral recommendations were given and along with follow-up instructions.    The patient will return in *2** weeks for  follow-up and possible additional banding as required. No complications were encountered and the patient tolerated the procedure well.   

## 2014-03-29 NOTE — Patient Instructions (Signed)
HEMORRHOID BANDING PROCEDURE    FOLLOW-UP CARE   1. The procedure you have had should have been relatively painless since the banding of the area involved does not have nerve endings and there is no pain sensation.  The rubber band cuts off the blood supply to the hemorrhoid and the band may fall off as soon as 48 hours after the banding (the band may occasionally be seen in the toilet bowl following a bowel movement). You may notice a temporary feeling of fullness in the rectum which should respond adequately to plain Tylenol or Motrin.  2. Following the banding, avoid strenuous exercise that evening and resume full activity the next day.  A sitz bath (soaking in a warm tub) or bidet is soothing, and can be useful for cleansing the area after bowel movements.     3. To avoid constipation, take two tablespoons of natural wheat bran, natural oat bran, flax, Benefiber or any over the counter fiber supplement and increase your water intake to 7-8 glasses daily.    4. Unless you have been prescribed anorectal medication, do not put anything inside your rectum for two weeks: No suppositories, enemas, fingers, etc.  5. Occasionally, you may have more bleeding than usual after the banding procedure.  This is often from the untreated hemorrhoids rather than the treated one.  Don't be concerned if there is a tablespoon or so of blood.  If there is more blood than this, lie flat with your bottom higher than your head and apply an ice pack to the area. If the bleeding does not stop within a half an hour or if you feel faint, call our office at [phone] or go to the emergency room.  6. Problems are not common; however, if there is a substantial amount of bleeding, severe pain, chills, fever or difficulty passing urine (very rare) or other problems, you should call us at [phone] or report to the nearest emergency room.  7. Do not stay seated continuously for more than 2-3 hours for a day or two  after the procedure.  Tighten your buttock muscles 10-15 times every two hours and take 10-15 deep breaths every 1-2 hours.  Do not spend more than a few minutes on the toilet if you cannot empty your bowel; instead re-visit the toilet at a later time.   Your 3rd banding is scheduled on 06/04/2014 at 8:30am

## 2014-06-04 ENCOUNTER — Encounter: Payer: Medicare Other | Admitting: Gastroenterology

## 2014-06-16 ENCOUNTER — Other Ambulatory Visit (HOSPITAL_COMMUNITY): Payer: Self-pay | Admitting: Internal Medicine

## 2014-06-16 DIAGNOSIS — Z1231 Encounter for screening mammogram for malignant neoplasm of breast: Secondary | ICD-10-CM

## 2014-06-16 DIAGNOSIS — M810 Age-related osteoporosis without current pathological fracture: Secondary | ICD-10-CM

## 2014-06-21 ENCOUNTER — Ambulatory Visit (HOSPITAL_COMMUNITY)
Admission: RE | Admit: 2014-06-21 | Discharge: 2014-06-21 | Disposition: A | Discharge status: Home / Self Care | Payer: Medicare Other | Source: Ambulatory Visit | Attending: Internal Medicine | Admitting: Internal Medicine

## 2014-06-21 DIAGNOSIS — Z780 Asymptomatic menopausal state: Secondary | ICD-10-CM | POA: Insufficient documentation

## 2014-06-21 DIAGNOSIS — Z13820 Encounter for screening for osteoporosis: Secondary | ICD-10-CM | POA: Insufficient documentation

## 2014-06-21 DIAGNOSIS — Z1231 Encounter for screening mammogram for malignant neoplasm of breast: Secondary | ICD-10-CM | POA: Diagnosis not present

## 2014-06-21 DIAGNOSIS — M810 Age-related osteoporosis without current pathological fracture: Secondary | ICD-10-CM

## 2014-07-26 ENCOUNTER — Ambulatory Visit (INDEPENDENT_AMBULATORY_CARE_PROVIDER_SITE_OTHER): Payer: Medicare Other | Admitting: Gastroenterology

## 2014-07-26 ENCOUNTER — Encounter: Payer: Self-pay | Admitting: Gastroenterology

## 2014-07-26 VITALS — BP 134/62 | HR 76 | Ht 62.0 in | Wt 142.5 lb

## 2014-07-26 DIAGNOSIS — K648 Other hemorrhoids: Secondary | ICD-10-CM

## 2014-07-26 NOTE — Progress Notes (Signed)
PROCEDURE NOTE: The patient presents with symptomatic grade **3*  hemorrhoids, requesting rubber band ligation of his/her hemorrhoidal disease.  All risks, benefits and alternative forms of therapy were described and informed consent was obtained.   The anorectum was pre-medicated with lubricant and nitroglycerine ointment The decision was made to band the *left lateral** internal hemorrhoid, and the [facility] System was used to perform band ligation without complication.  Digital anorectal examination was then performed to assure proper positioning of the band, and to adjust the banded tissue as required.  The patient was discharged home without pain or other issues.  Dietary and behavioral recommendations were given and along with follow-up instructions.    The patient will return in *4** weeks for  follow-up and possible additional banding as required. No complications were encountered and the patient tolerated the procedure well.   

## 2014-07-26 NOTE — Patient Instructions (Signed)
HEMORRHOID BANDING PROCEDURE    FOLLOW-UP CARE   1. The procedure you have had should have been relatively painless since the banding of the area involved does not have nerve endings and there is no pain sensation.  The rubber band cuts off the blood supply to the hemorrhoid and the band may fall off as soon as 48 hours after the banding (the band may occasionally be seen in the toilet bowl following a bowel movement). You may notice a temporary feeling of fullness in the rectum which should respond adequately to plain Tylenol or Motrin.  2. Following the banding, avoid strenuous exercise that evening and resume full activity the next day.  A sitz bath (soaking in a warm tub) or bidet is soothing, and can be useful for cleansing the area after bowel movements.     3. To avoid constipation, take two tablespoons of natural wheat bran, natural oat bran, flax, Benefiber or any over the counter fiber supplement and increase your water intake to 7-8 glasses daily.    4. Unless you have been prescribed anorectal medication, do not put anything inside your rectum for two weeks: No suppositories, enemas, fingers, etc.  5. Occasionally, you may have more bleeding than usual after the banding procedure.  This is often from the untreated hemorrhoids rather than the treated one.  Don't be concerned if there is a tablespoon or so of blood.  If there is more blood than this, lie flat with your bottom higher than your head and apply an ice pack to the area. If the bleeding does not stop within a half an hour or if you feel faint, call our office at [phone] or go to the emergency room.  6. Problems are not common; however, if there is a substantial amount of bleeding, severe pain, chills, fever or difficulty passing urine (very rare) or other problems, you should call us at [phone] or report to the nearest emergency room.  7. Do not stay seated continuously for more than 2-3 hours for a day or two  after the procedure.  Tighten your buttock muscles 10-15 times every two hours and take 10-15 deep breaths every 1-2 hours.  Do not spend more than a few minutes on the toilet if you cannot empty your bowel; instead re-visit the toilet at a later time.   Your follow up appointment is scheduled on 09/27/2014 at 2:15pm

## 2014-09-27 ENCOUNTER — Ambulatory Visit: Payer: Medicare Other | Admitting: Gastroenterology

## 2014-11-02 ENCOUNTER — Encounter: Payer: Self-pay | Admitting: Gastroenterology

## 2014-11-02 ENCOUNTER — Ambulatory Visit (INDEPENDENT_AMBULATORY_CARE_PROVIDER_SITE_OTHER): Payer: Medicare Other | Admitting: Gastroenterology

## 2014-11-02 VITALS — BP 108/70 | HR 72 | Resp 14 | Ht 62.0 in | Wt 141.0 lb

## 2014-11-02 DIAGNOSIS — K648 Other hemorrhoids: Secondary | ICD-10-CM

## 2014-11-02 NOTE — Progress Notes (Signed)
PROCEDURE NOTE: The patient presents with symptomatic grade *3**  hemorrhoids, requesting rubber band ligation of his/her hemorrhoidal disease.  All risks, benefits and alternative forms of therapy were described and informed consent was obtained.   The anorectum was pre-medicated with lubricant and nitroglycerine ointment The decision was made to band the *lateral** internal hemorrhoid, and the [place] was used to perform band ligation without complication.  Digital anorectal examination was then performed to assure proper positioning of the band, and to adjust the banded tissue as required.  The patient was discharged home without pain or other issues.  Dietary and behavioral recommendations were given and along with follow-up instructions.    The patient will return in **4* weeks for  follow-up and possible additional banding as required. No complications were encountered and the patient tolerated the procedure well.  During hemorrhoids improved but over the past few weeks she's noticed a new hemorrhoid that is protruding.  On exam there is a grade 3 hemorrhoid in the left lateral position.

## 2014-11-02 NOTE — Patient Instructions (Signed)
HEMORRHOID BANDING PROCEDURE    FOLLOW-UP CARE   1. The procedure you have had should have been relatively painless since the banding of the area involved does not have nerve endings and there is no pain sensation.  The rubber band cuts off the blood supply to the hemorrhoid and the band may fall off as soon as 48 hours after the banding (the band may occasionally be seen in the toilet bowl following a bowel movement). You may notice a temporary feeling of fullness in the rectum which should respond adequately to plain Tylenol or Motrin.  2. Following the banding, avoid strenuous exercise that evening and resume full activity the next day.  A sitz bath (soaking in a warm tub) or bidet is soothing, and can be useful for cleansing the area after bowel movements.     3. To avoid constipation, take two tablespoons of natural wheat bran, natural oat bran, flax, Benefiber or any over the counter fiber supplement and increase your water intake to 7-8 glasses daily.    4. Unless you have been prescribed anorectal medication, do not put anything inside your rectum for two weeks: No suppositories, enemas, fingers, etc.  5. Occasionally, you may have more bleeding than usual after the banding procedure.  This is often from the untreated hemorrhoids rather than the treated one.  Don't be concerned if there is a tablespoon or so of blood.  If there is more blood than this, lie flat with your bottom higher than your head and apply an ice pack to the area. If the bleeding does not stop within a half an hour or if you feel faint, call our office at [phone] or go to the emergency room.  6. Problems are not common; however, if there is a substantial amount of bleeding, severe pain, chills, fever or difficulty passing urine (very rare) or other problems, you should call us at [phone] or report to the nearest emergency room.  7. Do not stay seated continuously for more than 2-3 hours for a day or two  after the procedure.  Tighten your buttock muscles 10-15 times every two hours and take 10-15 deep breaths every 1-2 hours.  Do not spend more than a few minutes on the toilet if you cannot empty your bowel; instead re-visit the toilet at a later time.   Your follow up appointment is scheduled on 12/08/2014 at 1:45pm

## 2014-11-08 ENCOUNTER — Other Ambulatory Visit (HOSPITAL_COMMUNITY): Payer: Self-pay | Admitting: Internal Medicine

## 2014-11-08 DIAGNOSIS — I351 Nonrheumatic aortic (valve) insufficiency: Secondary | ICD-10-CM

## 2014-11-12 ENCOUNTER — Ambulatory Visit (HOSPITAL_COMMUNITY): Payer: BC Managed Care – PPO

## 2014-12-08 ENCOUNTER — Encounter: Payer: Self-pay | Admitting: Gastroenterology

## 2014-12-08 ENCOUNTER — Ambulatory Visit (INDEPENDENT_AMBULATORY_CARE_PROVIDER_SITE_OTHER): Payer: Medicare Other | Admitting: Gastroenterology

## 2014-12-08 VITALS — BP 118/80 | HR 66 | Ht 62.0 in | Wt 140.8 lb

## 2014-12-08 DIAGNOSIS — K219 Gastro-esophageal reflux disease without esophagitis: Secondary | ICD-10-CM | POA: Diagnosis not present

## 2014-12-08 DIAGNOSIS — K648 Other hemorrhoids: Secondary | ICD-10-CM

## 2014-12-08 NOTE — Assessment & Plan Note (Signed)
Symptoms well-controlled with Protonix.  Plan to continue with the same.

## 2014-12-08 NOTE — Progress Notes (Signed)
      History of Present Illness:  Ms. Spitler has returned for follow-up of hemorrhoids and GERD.  While she has occasional prolapse of a hemorrhoid following a bowel movement she has no pain or bleeding.  The hemorrhoid easily is reduced.  She complains of excess belching upon awakening.  She is without pyrosis.  Since taking Protonix sinus infections have decreased in frequency.    Review of Systems: Pertinent positive and negative review of systems were noted in the above HPI section. All other review of systems were otherwise negative.    Current Medications, Allergies, Past Medical History, Past Surgical History, Family History and Social History were reviewed in [facility] record  Vital signs were reviewed in today's medical record. Physical Exam: General: Well developed , well nourished, no acute distress Skin: anicteric Head: Normocephalic and atraumatic Eyes:  sclerae anicteric, EOMI Ears: Normal auditory acuity Mouth: No deformity or lesions Lungs: Clear throughout to auscultation Heart: Regular rate and rhythm; no murmurs, rubs or bruits Abdomen: Soft, non tender and non distended. No masses, hepatosplenomegaly or hernias noted. Normal Bowel sounds Rectal:deferred Musculoskeletal: Symmetrical with no gross deformities  Pulses:  Normal pulses noted Extremities: No clubbing, cyanosis, edema or deformities noted Neurological: Alert oriented x 4, grossly nonfocal Psychological:  Alert and cooperative. Normal mood and affect  See Assessment and Plan under Problem List

## 2014-12-08 NOTE — Assessment & Plan Note (Signed)
Excellent response following band ligation.  P when necessary

## 2014-12-08 NOTE — Patient Instructions (Signed)
Follow up as needed

## 2015-02-04 ENCOUNTER — Encounter: Payer: Self-pay | Admitting: Gastroenterology

## 2015-03-24 ENCOUNTER — Encounter: Payer: Self-pay | Admitting: Cardiology

## 2015-03-24 ENCOUNTER — Ambulatory Visit (INDEPENDENT_AMBULATORY_CARE_PROVIDER_SITE_OTHER): Payer: Medicare Other | Admitting: Cardiology

## 2015-03-24 VITALS — BP 130/65 | HR 62 | Ht 62.0 in | Wt 137.0 lb

## 2015-03-24 DIAGNOSIS — R9431 Abnormal electrocardiogram [ECG] [EKG]: Secondary | ICD-10-CM

## 2015-03-24 DIAGNOSIS — I351 Nonrheumatic aortic (valve) insufficiency: Secondary | ICD-10-CM | POA: Insufficient documentation

## 2015-03-24 DIAGNOSIS — R0609 Other forms of dyspnea: Secondary | ICD-10-CM

## 2015-03-24 DIAGNOSIS — R0600 Dyspnea, unspecified: Secondary | ICD-10-CM | POA: Insufficient documentation

## 2015-03-24 NOTE — Patient Instructions (Addendum)
Medication Instructions:  Your physician recommends that you continue on your current medications as directed. Please refer to the Current Medication list given to you today.  Labwork: NONE  Testing/Procedures: Your physician has requested that you have an echocardiogram. Echocardiography is a painless test that uses sound waves to create images of your heart. It provides your doctor with information about the size and shape of your heart and how well your heart's chambers and valves are working. This procedure takes approximately one hour. There are no restrictions for this procedure.  Your physician has requested that you have a lexiscan myoview. For further information please visit [email or web address]. Please follow instruction sheet, as given.  A chest x-ray takes a picture of the organs and structures inside the chest, including the heart, lungs, and blood vessels. This test can show several things, including, whether the heart is enlarges; whether fluid is building up in the lungs; and whether pacemaker / defibrillator leads are still in place.  [facility]  Follow-Up: 1 YEAR OV/EKG

## 2015-03-24 NOTE — Progress Notes (Signed)
Cardiology Office Note   Date:  03/24/2015   ID:  Erika Wu, DOB [birth year], MRN [id number]  PCP:  Criselda Peaches, MD  Cardiologist: Darlin Coco MD  Chief Complaint  Patient presents with  . Palpitations      History of Present Illness: Erika Wu is a 69 y.o. female who presents for cardiology evaluation.  She is a medical patient of Dr. Levin Erp.  She is being seen for evaluation of exertional dyspnea.  She has a past history of a heart murmur.  She had an echocardiogram with results as noted below.  2D echo on 10/21/13: - Left ventricle: The cavity size was normal. Systolic function was normal. The estimated ejection fraction was in the range of 55% to 60%. Wall motion was normal; there were no regional wall motion abnormalities. Doppler parameters are consistent with abnormal left ventricular relaxation (grade 1 diastolic dysfunction). - Aortic valve: Bicuspid; normal thickness leaflets. Transvalvular velocity was within the normal range. There was no stenosis. Mild to moderate regurgitation. 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 - Ascending aorta: The ascending aorta was mildly dilated. - Mitral valve: No regurgitation. - Right ventricle: Systolic function was normal. - Pulmonary arteries: Systolic pressure was within the normal range. - Pericardium, extracardiac: There was no pericardial effusion. Impressions:  - NOrmal biventrciular function. Bicuspid aortic valve and mildly dilated ascending aorta. Over the past year she has noted some increased exertional dyspnea.  She notes it particularly walking up hills.  She denies any chest pain.  Is not been aware of any dizziness or syncope.  She has occasional palpitations which have in the past and were attributed to anxiety and stress. Her family history reveals that her father's side of the family is strongly positive for heart disease.  Her father died at age 51.   He had had prior CABG.  He died of an enlarged heart and angina pectoris.  The patient's mother is still living at age 35 and lives with [name]. Social history reveals that she quit smoking cigarettes about 10 years ago.  She does drink a moderate amount of white wine.  Past Medical History  Diagnosis Date  . Anxiety   . Depression   . Colon polyps   . GERD (gastroesophageal reflux disease)   . Hemorrhoids     Past Surgical History  Procedure Laterality Date  . Tonsillectomy and adenoidectomy    . Tubal ligation    . Colonoscopy       Current Outpatient Prescriptions  Medication Sig Dispense Refill  . clonazePAM (KLONOPIN) 0.5 MG tablet Take 0.5 mg by mouth at bedtime. Takes 2 pills before bed.    . escitalopram (LEXAPRO) 20 MG tablet Take 20 mg by mouth daily.     . pantoprazole (PROTONIX) 40 MG tablet Take 1 tablet (40 mg total) by mouth 2 (two) times daily. 60 tablet 3   No current facility-administered medications for this visit.    Allergies:   Review of patient's allergies indicates no known allergies.    Social History:  The patient  reports that she has quit smoking. Her smoking use included Cigarettes. She has never used smokeless tobacco. She reports that she drinks alcohol. She reports that she does not use illicit drugs.   Family History:  The patient's family history includes Colon cancer in her maternal grandmother; Diverticulosis in her maternal grandfather; Heart attack in her father; Heart disease in her father; Stomach cancer in  her maternal grandmother; Stroke in her paternal grandfather. There is no history of Hypertension.    ROS:  Please see the history of present illness.   Otherwise, review of systems are positive for none.   All other systems are reviewed and negative.    PHYSICAL EXAM: VS:  BP 130/65 mmHg  Pulse 62  Ht 5\' 2"  (1.575 m)  Wt 137 lb (62.143 kg)  BMI 25.05 kg/m2 , BMI Body mass index is 25.05 kg/(m^2). GEN: Well nourished,  well developed, in no acute distress HEENT: normal Neck: no JVD, carotid bruits, or masses Cardiac: RRR; there is a grade 3/6 decrescendo murmur of aortic insufficiency at the left sternal edge. Respiratory:  clear to auscultation bilaterally, normal work of breathing GI: soft, nontender, nondistended, + BS MS: no deformity or atrophy Skin: warm and dry, no rash Neuro:  Strength and sensation are intact Psych: euthymic mood, full affect   EKG:  EKG is ordered today. The ekg ordered today demonstrates normal sinus rhythm and prominent Q waves in the inferior leads suggesting possible old inferior wall myocardial infarction.   Recent Labs: No results found for requested labs within last 365 days.    Lipid Panel No results found for: CHOL, TRIG, HDL, CHOLHDL, VLDL, LDLCALC, LDLDIRECT    Wt Readings from Last 3 Encounters:  03/24/15 137 lb (62.143 kg)  12/08/14 140 lb 12.8 oz (63.866 kg)  11/02/14 141 lb (63.957 kg)      Other studies Reviewed: Additional studies/ records that were reviewed today include: Outside records from Dr. Nyoka Cowden..    ASSESSMENT AND PLAN:  1.  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 2.  Exertional dyspnea probably secondary to aortic valve disease.  Rule out coexisting ischemic heart disease.  Her EKG suggests possible old inferior wall myocardial infarction.  Disposition we will get a chest x-ray.  We will have her return for a two-dimensional echocardiogram to assess her aortic valve and LV systolic function.  We will also get a Lexiscan Myoview stress test to evaluate her for coronary disease. No new meds were prescribed. Many thanks for the opportunity to see this pleasant woman with you.  We will be in touch regarding the results of her cardiology studies.   Current medicines are reviewed at length with the patient today.  The patient does not  have concerns regarding medicines.  The following changes have been made:  no change  Labs/ tests ordered today include:  Orders Placed This Encounter  Procedures  . DG Chest 2 View  . Myocardial Perfusion Imaging  . EKG 12-Lead  . ECHOCARDIOGRAM COMPLETE       Signed, Darlin Coco MD 03/24/2015 6:07 PM    [facility] [address], [place], [place]  [address] Phone: [phone]; Fax: [phone]

## 2015-03-30 ENCOUNTER — Telehealth (HOSPITAL_COMMUNITY): Payer: Self-pay | Admitting: *Deleted

## 2015-03-30 NOTE — Telephone Encounter (Signed)
Patient given detailed instructions per Myocardial Perfusion Study Information Sheet for test on 04/04/15 at 12:30 Patient Notified to arrive 15 minutes early, and that it is imperative to arrive on time for appointment to keep from having the test rescheduled. Patient verbalized understanding. Hubbard Robinson, RN

## 2015-04-04 ENCOUNTER — Other Ambulatory Visit: Payer: Self-pay

## 2015-04-04 ENCOUNTER — Ambulatory Visit (HOSPITAL_BASED_OUTPATIENT_CLINIC_OR_DEPARTMENT_OTHER): Payer: Medicare Other

## 2015-04-04 ENCOUNTER — Ambulatory Visit (HOSPITAL_COMMUNITY): Payer: Medicare Other | Attending: Cardiology

## 2015-04-04 DIAGNOSIS — I351 Nonrheumatic aortic (valve) insufficiency: Secondary | ICD-10-CM

## 2015-04-04 DIAGNOSIS — R9431 Abnormal electrocardiogram [ECG] [EKG]: Secondary | ICD-10-CM | POA: Diagnosis not present

## 2015-04-04 DIAGNOSIS — I352 Nonrheumatic aortic (valve) stenosis with insufficiency: Secondary | ICD-10-CM | POA: Insufficient documentation

## 2015-04-04 DIAGNOSIS — I77811 Abdominal aortic ectasia: Secondary | ICD-10-CM | POA: Diagnosis not present

## 2015-04-04 DIAGNOSIS — I517 Cardiomegaly: Secondary | ICD-10-CM | POA: Diagnosis not present

## 2015-04-04 LAB — MYOCARDIAL PERFUSION IMAGING
LV dias vol: 95 mL
LV sys vol: 39 mL
Peak HR: 109 {beats}/min
RATE: 0.28
Rest HR: 62 {beats}/min
SDS: 2
SRS: 3
SSS: 5
TID: 0.82

## 2015-04-04 MED ORDER — TECHNETIUM TC 99M SESTAMIBI GENERIC - CARDIOLITE
32.5000 | Freq: Once | INTRAVENOUS | Status: AC | PRN
  Administered 2015-04-04: 33 via INTRAVENOUS

## 2015-04-04 MED ORDER — TECHNETIUM TC 99M SESTAMIBI GENERIC - CARDIOLITE
10.4000 | Freq: Once | INTRAVENOUS | Status: AC | PRN
  Administered 2015-04-04: 10 via INTRAVENOUS

## 2015-04-04 MED ORDER — REGADENOSON 0.4 MG/5ML IV SOLN
0.4000 mg | Freq: Once | INTRAVENOUS | Status: AC
  Administered 2015-04-04: 0.4 mg via INTRAVENOUS

## 2015-04-07 ENCOUNTER — Telehealth: Payer: Self-pay | Admitting: *Deleted

## 2015-04-07 NOTE — Telephone Encounter (Signed)
Advised patient and will forward to Dr Nyoka Cowden

## 2015-04-07 NOTE — Telephone Encounter (Signed)
-----   Message from Darlin Coco, MD sent at 04/05/2015  7:45 AM EDT ----- Please report.  They nuclear stress test did not show any evidence of myocardial scar or ischemia.  It was a normal stress test.  Send report to Dr. Nyoka Cowden.

## 2015-04-07 NOTE — Telephone Encounter (Signed)
-----   Message from Darlin Coco, MD sent at 04/05/2015  7:44 AM EDT ----- Please report.  The echocardiogram shows normal left ventricular systolic function.  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  There is moderate aortic insufficiency.  She does not need aortic valve surgery at this point.  Continue medical therapy.  Consider adding an Ace or ARB to help with afterload reduction for her aortic insufficiency but I would leave that up to the discretion of Dr. Zada Girt who follows her.  Please send a copy to Dr. Nyoka Cowden.  We should plan to repeat another echocardiogram in about a year to follow her aortic valve.

## 2016-02-13 ENCOUNTER — Other Ambulatory Visit: Payer: Self-pay | Admitting: Internal Medicine

## 2016-02-13 DIAGNOSIS — Z1231 Encounter for screening mammogram for malignant neoplasm of breast: Secondary | ICD-10-CM

## 2016-02-20 ENCOUNTER — Ambulatory Visit
Admission: RE | Admit: 2016-02-20 | Discharge: 2016-02-20 | Disposition: A | Discharge status: Home / Self Care | Payer: Medicare Other | Source: Ambulatory Visit | Attending: Internal Medicine | Admitting: Internal Medicine

## 2016-02-20 DIAGNOSIS — Z1231 Encounter for screening mammogram for malignant neoplasm of breast: Secondary | ICD-10-CM

## 2016-07-03 ENCOUNTER — Ambulatory Visit (INDEPENDENT_AMBULATORY_CARE_PROVIDER_SITE_OTHER): Payer: Medicare Other | Admitting: Nurse Practitioner

## 2016-07-03 ENCOUNTER — Ambulatory Visit: Payer: Medicare Other | Admitting: Nurse Practitioner

## 2016-07-03 ENCOUNTER — Encounter: Payer: Self-pay | Admitting: Nurse Practitioner

## 2016-07-03 VITALS — BP 157/81 | HR 77 | Ht 62.0 in | Wt 115.8 lb

## 2016-07-03 DIAGNOSIS — I351 Nonrheumatic aortic (valve) insufficiency: Secondary | ICD-10-CM

## 2016-07-03 DIAGNOSIS — R0789 Other chest pain: Secondary | ICD-10-CM | POA: Diagnosis not present

## 2016-07-03 DIAGNOSIS — R002 Palpitations: Secondary | ICD-10-CM | POA: Diagnosis not present

## 2016-07-03 NOTE — Progress Notes (Signed)
Office Visit    Patient Name: Erika Wu Date of Encounter: 07/03/2016  Primary Care Provider:  Criselda Peaches, MD Primary Cardiologist:  Erika Marshall, MD  will follow with P. Martinique, MD   Chief Complaint    70 year old female with a history of palpitations and moderate aortic insufficiency who presents for follow-up related to chest pain, dyspnea, and palpitations.  Past Medical History    Past Medical History:  Diagnosis Date  . Anxiety   . Chest pain    a. 03/2015 Lexiscan MV: EF 59%, no ischemia/infarct.  . Colon polyps   . Depression   . GERD (gastroesophageal reflux disease)   . Hemorrhoids   . Moderate aortic insufficiency    a. 03/2015 Echo: EF 55-60%, no rwma, mild LVH, Gr 1 DD, mild AS, mod AI (bicuspid AoV by report, poorly visualized on this study), Asc Ao diam 6mm, triv MR.   Past Surgical History:  Procedure Laterality Date  . COLONOSCOPY    . TONSILLECTOMY AND ADENOIDECTOMY    . TUBAL LIGATION      Allergies  No Known Allergies  History of Present Illness    70 year old female with the above complex past medical history including moderate aortic insufficiency, chest pain with negative Myoview in July 2016, and palpitations, GERD, anxiety, and depression. She was previously followed by Dr. Mare Wu and was last seen in July 2016 at which time she complained of dyspnea and chest pain. Echocardiogram showed stable, moderate aortic insufficiency. As noted above, Myoview was nonischemic. Over the past year, she has done relatively well though her mother, who lives with her, fell and fractured her hip in January which created a lot of life stress and anxiety for Erika Wu. In that setting, she was noticing more frequent palpitations. She did see her primary care provider and says that she had lab work done which was reportedly normal.   More recently, over the past month, she has had 3 separate episodes of chest discomfort and is also noted  increased frequency of palpitations as well as dyspnea on exertion. The first episode of chest discomfort awoke her from sleep and resolved with sitting up. She thought perhaps symptoms were related to GERD. The second episode occurred while walking and was pressure-like in character with associated dyspnea. Symptoms resolved within 15-20 minutes of rest. The most recent episode occurred while she was attending a "healing hands" session that a friend was holding. In the session, she was lying back with her eyes closed when she began to experience hard and fast palpitations with associated chest pressure and heaviness as well as dyspnea. This persisted for approximately 20 minutes prior to resolving spontaneously. Because of these symptoms, she was seen by primary care and subsequently advised to follow-up with cardiology. She has remained active and has not had any symptoms since October 9.  She denies PND, orthopnea, dizziness, syncope, edema and weight gain, or early satiety.  Home Medications    Prior to Admission medications   Medication Sig Start Date End Date Taking? Authorizing Provider  buPROPion (WELLBUTRIN XL) 150 MG 24 hr tablet Take 450 mg by mouth every morning. 06/07/16  Yes Historical Provider, MD  clonazePAM (KLONOPIN) 0.5 MG tablet Take 0.5 mg by mouth at bedtime. 05/25/16  Yes Historical Provider, MD  mirtazapine (REMERON) 30 MG tablet Take 30 mg by mouth at bedtime. 06/15/16  Yes Historical Provider, MD  pantoprazole (PROTONIX) 40 MG tablet Take 40 mg by mouth daily as  needed (acid reflux).   Yes Historical Provider, MD  temazepam (RESTORIL) 15 MG capsule Take 15 mg by mouth at bedtime. 06/08/16  Yes Historical Provider, MD    Review of Systems    As above, she has had 3 separate episodes of chest discomfort and is also noted exertional dyspnea and intermittent tachypalpitations. She denies PND, orthopnea, dizziness, syncope, edema, or early satiety. All other systems reviewed and are  otherwise negative except as noted above.  Physical Exam    VS:  BP (!) 157/81   Pulse 77   Ht 5\' 2"  (1.575 m)   Wt 115 lb 12.8 oz (52.5 kg)   BMI 21.18 kg/m  , BMI Body mass index is 21.18 kg/m. GEN: Well nourished, well developed, in no acute distress.  HEENT: normal.  Neck: Supple, no JVD, carotid bruits, or masses. Cardiac: RRR, 2/6 systolic and diastolic murmur heard loudest at the upper sternal border but noted throughout. No rubs, or gallops. No clubbing, cyanosis, edema.  Radials/DP/PT 2+ and equal bilaterally.  Respiratory:  Respirations regular and unlabored, clear to auscultation bilaterally. GI: Soft, nontender, nondistended, BS + x 4. MS: no deformity or atrophy. Skin: warm and dry, no rash. Neuro:  Strength and sensation are intact. Psych: Normal affect.  Accessory Clinical Findings    ECG - Regular sinus rhythm, 77, inferolateral q's, question biatrial enlargement. No acute ST or T changes.  Assessment & Plan    1.  Chest pressure: Patient presents following 3 episodes of chest discomfort in the past month. One occurred during sleep and reminded her of reflux symptoms, the other occurred with exertion and was associated with dyspnea, and the last occurred while lying down with her eyes closed and was associated with palpitations and dyspnea. Symptoms have been lasting about 20 minutes and resolving spontaneously. She has a prior history of chest pain with negative Myoview in 2016. She also has significant family history for premature CAD with her father dying at age 62 of a myocardial infarction. He has undergone CABG in his late 87s. I will arrange for repeat Lexiscan Myoview to rule out ischemia. She is to take aspirin 81 mg daily.  2. Moderate aortic insufficiency: This was stable on last echo in July 2016. As it has been a year since her last echo and as she has developed symptoms of dyspnea, I will repeat echocardiogram.  3. Hypertension: Patient's blood pressure  was elevated in clinic today though she notes that it is not generally elevated. I see on previous visits, it was normal. She will follow this at home for now.  4. Palpitations: Per prior notes, she has a history of palpitations but has never worn a monitor. She has significant hard and fast palpitations during an episode of chest pain and dyspnea on October 9. In that setting, I will arrange for a 30 day event monitor.  5. Disposition: Follow-up testing as outlined above. I will plan to see her back in the next 1-2 months or sooner if necessary.   Murray Hodgkins, NP 07/03/2016, 11:08 AM

## 2016-07-03 NOTE — Patient Instructions (Signed)
Testing/Procedures: Your physician has requested that you have a lexiscan myoview. For further information please visit [email or web address]. Please follow instruction sheet, as given.  Your physician has recommended that you wear an event monitor. Event monitors are medical devices that record the heart's electrical activity. Doctors most often [place] these monitors to diagnose arrhythmias. Arrhythmias are problems with the speed or rhythm of the heartbeat. The monitor is a small, portable device. You can wear one while you do your normal daily activities. This is usually used to diagnose what is causing palpitations/syncope (passing out).  Your physician has requested that you have an echocardiogram. Echocardiography is a painless test that uses sound waves to create images of your heart. It provides your doctor with information about the size and shape of your heart and how well your heart's chambers and valves are working. This procedure takes approximately one hour. There are no restrictions for this procedure.    Follow-Up: Your physician recommends that you schedule a follow-up appointment in: IN 2-3 MONTHS WITH DR Martinique .       If you need a refill on your cardiac medications before your next appointment, please call your pharmacy.

## 2016-07-11 ENCOUNTER — Ambulatory Visit (INDEPENDENT_AMBULATORY_CARE_PROVIDER_SITE_OTHER): Payer: Medicare Other

## 2016-07-11 ENCOUNTER — Encounter (HOSPITAL_COMMUNITY): Payer: Medicare Other

## 2016-07-11 ENCOUNTER — Telehealth (HOSPITAL_COMMUNITY): Payer: Self-pay

## 2016-07-11 DIAGNOSIS — I351 Nonrheumatic aortic (valve) insufficiency: Secondary | ICD-10-CM | POA: Diagnosis not present

## 2016-07-11 DIAGNOSIS — R0789 Other chest pain: Secondary | ICD-10-CM

## 2016-07-11 DIAGNOSIS — R002 Palpitations: Secondary | ICD-10-CM

## 2016-07-11 NOTE — Telephone Encounter (Signed)
Encounter complete. 

## 2016-07-13 ENCOUNTER — Telehealth: Payer: Self-pay | Admitting: Cardiology

## 2016-07-13 ENCOUNTER — Ambulatory Visit (HOSPITAL_COMMUNITY)
Admission: RE | Admit: 2016-07-13 | Discharge: 2016-07-13 | Disposition: A | Discharge status: Home / Self Care | Payer: Medicare Other | Source: Ambulatory Visit | Attending: Cardiology | Admitting: Cardiology

## 2016-07-13 ENCOUNTER — Encounter (HOSPITAL_COMMUNITY): Payer: Medicare Other

## 2016-07-13 DIAGNOSIS — Z8249 Family history of ischemic heart disease and other diseases of the circulatory system: Secondary | ICD-10-CM | POA: Diagnosis not present

## 2016-07-13 DIAGNOSIS — R002 Palpitations: Secondary | ICD-10-CM

## 2016-07-13 DIAGNOSIS — Z87891 Personal history of nicotine dependence: Secondary | ICD-10-CM | POA: Insufficient documentation

## 2016-07-13 DIAGNOSIS — R0789 Other chest pain: Secondary | ICD-10-CM | POA: Diagnosis not present

## 2016-07-13 DIAGNOSIS — I351 Nonrheumatic aortic (valve) insufficiency: Secondary | ICD-10-CM | POA: Diagnosis not present

## 2016-07-13 DIAGNOSIS — K219 Gastro-esophageal reflux disease without esophagitis: Secondary | ICD-10-CM | POA: Diagnosis not present

## 2016-07-13 DIAGNOSIS — I10 Essential (primary) hypertension: Secondary | ICD-10-CM | POA: Insufficient documentation

## 2016-07-13 LAB — MYOCARDIAL PERFUSION IMAGING
CHL CUP NUCLEAR SDS: 3
CHL CUP RESTING HR STRESS: 74 {beats}/min
CSEPPHR: 96 {beats}/min
LV dias vol: 110 mL (ref 46–106)
LV sys vol: 44 mL
SRS: 1
SSS: 4
TID: 1.13

## 2016-07-13 MED ORDER — REGADENOSON 0.4 MG/5ML IV SOLN
0.4000 mg | Freq: Once | INTRAVENOUS | Status: AC
  Administered 2016-07-13: 0.4 mg via INTRAVENOUS

## 2016-07-13 MED ORDER — TECHNETIUM TC 99M TETROFOSMIN IV KIT
9.9000 | PACK | Freq: Once | INTRAVENOUS | Status: AC | PRN
  Administered 2016-07-13: 9.9 via INTRAVENOUS
  Filled 2016-07-13: qty 10

## 2016-07-13 MED ORDER — TECHNETIUM TC 99M TETROFOSMIN IV KIT
30.9000 | PACK | Freq: Once | INTRAVENOUS | Status: AC | PRN
  Administered 2016-07-13: 30.9 via INTRAVENOUS
  Filled 2016-07-13: qty 31

## 2016-07-13 NOTE — Telephone Encounter (Signed)
Questions addressed regarding upcoming test. Pt voiced understanding and thanks.

## 2016-07-13 NOTE — Telephone Encounter (Signed)
Erika Wu is calling because she has a heart monitor and is schedule for a stress test today and is wanting to know should she keep the monitor off until after she is done with the stress tes t? Please call   Thanks

## 2016-07-17 ENCOUNTER — Encounter: Payer: Self-pay | Admitting: Physician Assistant

## 2016-07-19 ENCOUNTER — Encounter: Payer: Self-pay | Admitting: Physician Assistant

## 2016-07-19 ENCOUNTER — Ambulatory Visit (INDEPENDENT_AMBULATORY_CARE_PROVIDER_SITE_OTHER): Payer: Medicare Other | Admitting: Physician Assistant

## 2016-07-19 VITALS — BP 160/82 | HR 76 | Ht 61.2 in | Wt 116.8 lb

## 2016-07-19 DIAGNOSIS — Z01812 Encounter for preprocedural laboratory examination: Secondary | ICD-10-CM | POA: Diagnosis not present

## 2016-07-19 DIAGNOSIS — R9439 Abnormal result of other cardiovascular function study: Secondary | ICD-10-CM | POA: Diagnosis not present

## 2016-07-19 DIAGNOSIS — I351 Nonrheumatic aortic (valve) insufficiency: Secondary | ICD-10-CM | POA: Diagnosis not present

## 2016-07-19 DIAGNOSIS — I10 Essential (primary) hypertension: Secondary | ICD-10-CM

## 2016-07-19 LAB — LIPID PANEL
CHOL/HDL RATIO: 2.2 ratio (ref <5.0)
Cholesterol: 190 mg/dL (ref <200)
HDL: 86 mg/dL (ref >50)
LDL Cholesterol: 89 mg/dL
Triglycerides: 77 mg/dL (ref <150)
VLDL: 15 mg/dL (ref <30)

## 2016-07-19 LAB — CBC WITH DIFFERENTIAL/PLATELET
BASOS PCT: 0 %
Basophils Absolute: 0 cells/uL (ref 0–200)
EOS ABS: 120 {cells}/uL (ref 15–500)
Eosinophils Relative: 2 %
HEMATOCRIT: 40.5 % (ref 35.0–45.0)
HEMOGLOBIN: 13.9 g/dL (ref 11.7–15.5)
LYMPHS ABS: 1020 {cells}/uL (ref 850–3900)
Lymphocytes Relative: 17 %
MCH: 32.7 pg (ref 27.0–33.0)
MCHC: 34.3 g/dL (ref 32.0–36.0)
MCV: 95.3 fL (ref 80.0–100.0)
MONO ABS: 480 {cells}/uL (ref 200–950)
MPV: 9.6 fL (ref 7.5–12.5)
Monocytes Relative: 8 %
NEUTROS ABS: 4380 {cells}/uL (ref 1500–7800)
Neutrophils Relative %: 73 %
Platelets: 233 10*3/uL (ref 140–400)
RBC: 4.25 MIL/uL (ref 3.80–5.10)
RDW: 12.7 % (ref 11.0–15.0)
WBC: 6 10*3/uL (ref 3.8–10.8)

## 2016-07-19 LAB — COMPREHENSIVE METABOLIC PANEL
ALK PHOS: 138 U/L — AB (ref 33–130)
ALT: 21 U/L (ref 6–29)
AST: 18 U/L (ref 10–35)
Albumin: 4.3 g/dL (ref 3.6–5.1)
BUN: 12 mg/dL (ref 7–25)
CALCIUM: 9.2 mg/dL (ref 8.6–10.4)
CO2: 24 mmol/L (ref 20–31)
Chloride: 109 mmol/L (ref 98–110)
Creat: 0.62 mg/dL (ref 0.50–0.99)
GLUCOSE: 93 mg/dL (ref 65–99)
POTASSIUM: 4.6 mmol/L (ref 3.5–5.3)
Sodium: 141 mmol/L (ref 135–146)
TOTAL PROTEIN: 6.4 g/dL (ref 6.1–8.1)
Total Bilirubin: 0.6 mg/dL (ref 0.2–1.2)

## 2016-07-19 LAB — PROTIME-INR
INR: 1
PROTHROMBIN TIME: 10.4 s (ref 9.0–11.5)

## 2016-07-19 MED ORDER — METOPROLOL TARTRATE 25 MG PO TABS: 25.0000 mg | ORAL_TABLET | Freq: Two times a day (BID) | ORAL | 3 refills | Status: DC

## 2016-07-19 MED ORDER — ASPIRIN EC 81 MG PO TBEC: 81.0000 mg | DELAYED_RELEASE_TABLET | Freq: Every day | ORAL | Status: DC

## 2016-07-19 NOTE — Patient Instructions (Addendum)
Medication Instructions:  Your physician has recommended you make the following change in your medication:  1-START Aspirin 81 mg by mouth daily 2-START Metoprolol 25 mg by mouth twice daily   Labwork: Your physician recommends that you have lab work today- CMET, CBC, TSH, Lipid Panel and PT/INR  Testing/Procedures: NONE  Follow-Up: Your physician wants you to follow-up [id number] as already scheduled with Dr. Martinique.  Your physician recommends that you schedule a follow-up appointment in: 2 weeks with Rosaria Ferries PA.    If you need a refill on your cardiac medications before your next appointment, please call your pharmacy.  If you decide to have your heart cath done please call- [phone] today or tomorrow. If it is any other time please call the main number at [phone].

## 2016-07-19 NOTE — Progress Notes (Signed)
Cardiology Office Note   Date:  07/19/2016   ID:  Erika Wu, DOB 07/19/1946, MRN [id number]  PCP:  Erika Peaches, MD  Cardiologist:  Dr Erika Wu  Wu, Rhonda, PA-C   Chief Complaint  Patient presents with  . Follow-up    discuss stress test results    History of Present Illness: Erika Wu is a 70 y.o. 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 Echo not completed, monitor not completed but Myoview was abnormal and early follow-up recommended  Erika Wu presents for follow up of test results.   Her BP has been up since a couple of family issues (mother and son) caused her anxiety to increase about a year ago.   She feels her heart pound at times, rapid but regular since on the monitor. 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  She has not had true chest pain, Or any exertional symptoms, just feels her heart pound.     Past Medical History:  Diagnosis Date  . Anxiety   . Chest pain    a. 03/2015 Lexiscan MV: EF 59%, no ischemia/infarct.  . Colon polyps   . Depression   . GERD (gastroesophageal reflux disease)   . Hemorrhoids   . Moderate aortic insufficiency    a. 03/2015 Echo: EF 55-60%, no rwma, mild LVH, Gr 1 DD, mild AS, mod AI (bicuspid AoV by report, poorly visualized on this study), Asc Ao diam 81mm, triv MR.    Past Surgical History:  Procedure Laterality Date  . COLONOSCOPY    . TONSILLECTOMY AND ADENOIDECTOMY    . TUBAL LIGATION      Current Outpatient Prescriptions  Medication Sig Dispense Refill  . buPROPion (WELLBUTRIN XL) 150 MG 24 hr tablet Take 450 mg by mouth every morning.  3  . clonazePAM (KLONOPIN) 0.5 MG tablet Take 0.5 mg by mouth at bedtime.    . mirtazapine (REMERON) 30 MG tablet Take 30 mg by mouth at bedtime.    .  pantoprazole (PROTONIX) 40 MG tablet Take 40 mg by mouth daily as needed (acid reflux).    . temazepam (RESTORIL) 15 MG capsule Take 15 mg by mouth at bedtime.     No current facility-administered medications for this visit.     Allergies:   Patient has no known allergies.    Social History:  The patient  reports that she has quit smoking. Her smoking use included Cigarettes. She has a 7.50 pack-year smoking history. She has never used smokeless tobacco. She reports that she drinks alcohol. She reports that she does not use drugs.   Family History:  The patient's family history includes Colon cancer in her maternal grandmother; Diverticulosis in her maternal grandfather; HIV in her brother; Heart attack in her father; Heart disease in her father; Stomach cancer in her maternal grandmother; Stroke in her paternal grandfather.    ROS:  Please see the history of present illness. All other systems are reviewed and negative.    PHYSICAL EXAM: VS:  BP (!) 160/82   Pulse 76   Ht 5' 1.2" (1.554 m)   Wt 116 lb 12.8 oz (53 kg)   SpO2 98%   BMI 21.93 kg/m  , BMI Body mass index is 21.93 kg/m. GEN:  Well nourished, well developed, female in no acute distress  HEENT: normal for age  Neck: no JVD, no carotid bruit, no masses Cardiac: RRR; 2/6 SEM & 2/6 DM, no rubs, or gallops Respiratory:  clear to auscultation bilaterally, normal work of breathing GI: soft, nontender, nondistended, + BS MS: no deformity or atrophy; no edema; distal pulses are 2+ in all 4 extremities   Skin: warm and dry, no rash Neuro:  Strength and sensation are intact Psych: euthymic mood, full affect   EKG:  EKG is not ordered today.  MYOVIEW: 07/13/2016  Nuclear stress EF: 60%.  The left ventricular ejection fraction is normal (55-65%).  There was no ST segment deviation noted during stress.  There is a small defect of mild severity present in the apical lateral and apical location. The defect is reversible and  is consistent with a small area of ischemia although shifting breast attenuation artifact. is also a possiblity.  This is an intermediate risk study due to apical reversible defect.   Recent Labs: No results found for requested labs within last 8760 hours.    Lipid Panel No results found for: CHOL, TRIG, HDL, CHOLHDL, VLDL, LDLCALC, LDLDIRECT   Wt Readings from Last 3 Encounters:  07/19/16 116 lb 12.8 oz (53 kg)  07/13/16 115 lb (52.2 kg)  07/03/16 115 lb 12.8 oz (52.5 kg)     Other studies Reviewed: Additional studies/ records that were reviewed today include: Office notes and testing.  ASSESSMENT AND PLAN:  1.  Chest pain, abnormal Myoview: I discussed the test results with her and recommended cardiac catheterization. I advised her that if she did not want the cardiac catheterization, we would treat her medically as though she had heart disease, but would never know. I also advised that it is possible this was a false positive and her cath could be clean. I discussed the process and how it would be possible for her to go home after the procedure if it was clean, but she would need to stay overnight if she got a stent   The risks and benefits of a cardiac catheterization including, but not limited to, death, stroke, MI, kidney damage and bleeding were discussed with the patient who indicates understanding and agrees to proceed.   She is anxious about the procedure, but wants to proceed with it. However, she will have to tell her mother about this and also arrange care for her since she could potentially be admitted to the hospital. She will get labs done today, but call to confirm today or tomorrow. She was advised that the labs would only be good for a week.  Add baby aspirin and a beta blocker to her medication regimen. Check a lipid profile if it has not been done recently.  2. Hypertension: Her blood pressure is not well controlled. This may be partly due to anxiety, but it is  still elevated. We will add metoprolol 25 mg twice a day to her medication regimen.  3. Palpitations: Check a TSH as the patient does not believe this has been done recently.   Current medicines are reviewed at length with the patient today.  The patient does not have concerns regarding medicines.  The following changes have been made:  Add baby aspirin and metoprolol  Labs/ tests ordered today include:   Orders Placed This Encounter  Procedures  . CBC with Differential/Platelet  . Comprehensive metabolic panel  . Protime-INR  . TSH  . Lipid panel  Disposition:   FU with Dr. Martinique  Signed, Lenoard Aden  07/19/2016 9:33 AM    [place] Phone: [phone]; Fax: [phone]  This note was written with the assistance of speech recognition software. Please excuse any transcriptional errors.

## 2016-07-20 LAB — TSH: TSH: 0.84 m[IU]/L

## 2016-07-20 IMAGING — MG 2D DIGITAL SCREENING BILATERAL MAMMOGRAM WITH CAD AND ADJUNCT TO
9 of 12 series · 9 of 28 positions shown · non-contrast
Comparison: Previous exam(s).

CLINICAL DATA: Screening.

EXAM:
2D DIGITAL SCREENING BILATERAL MAMMOGRAM WITH CAD AND ADJUNCT TOMO

[L MLO]
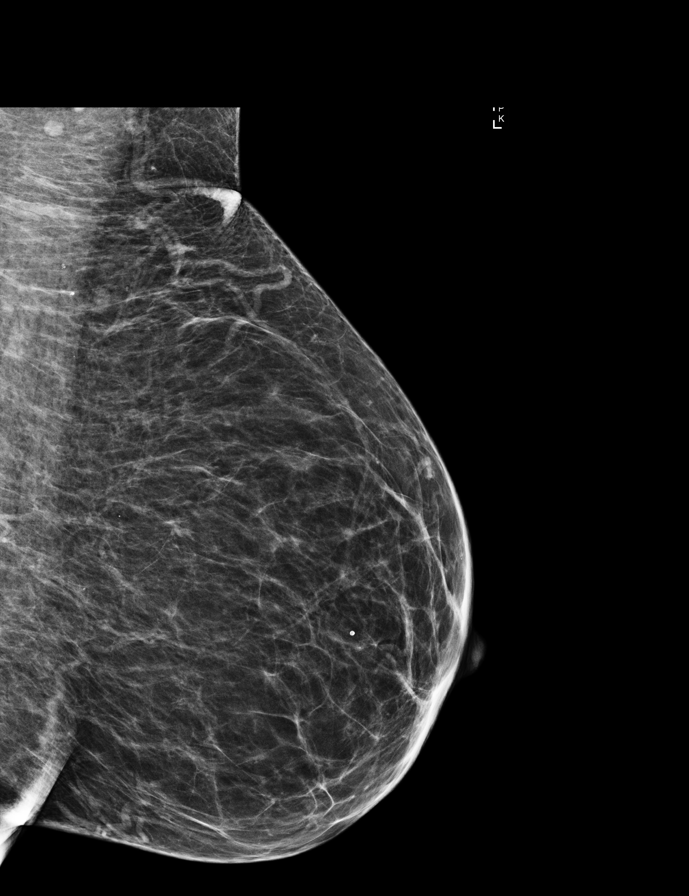

[L CC]
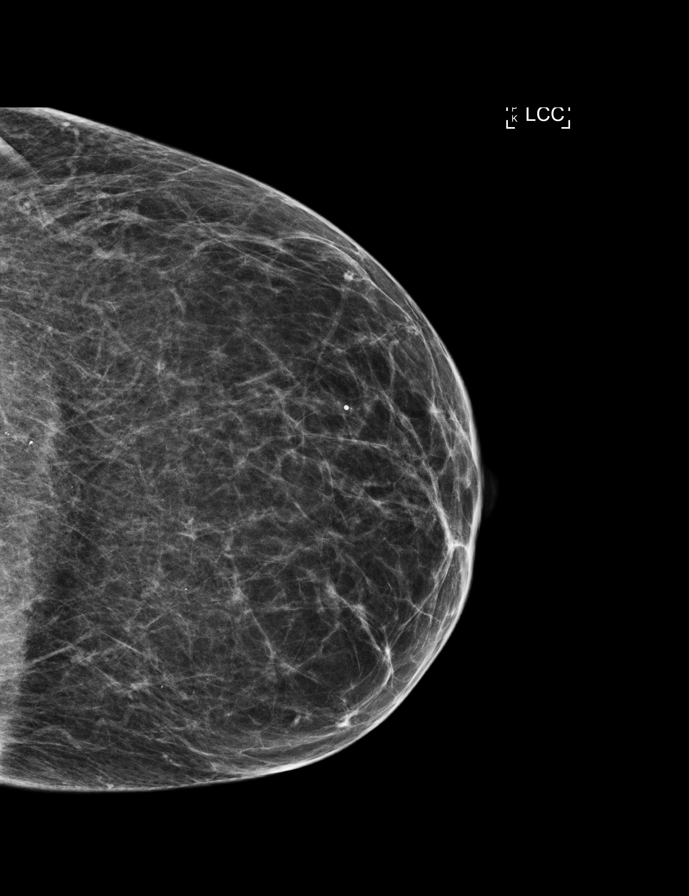

[R CC synth-2D]
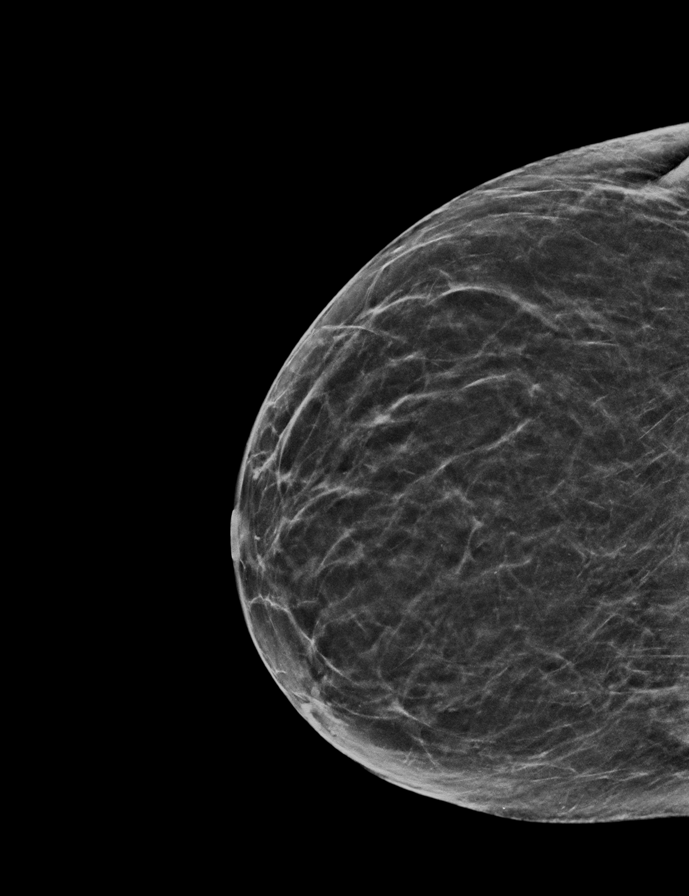

[R MLO synth-2D]
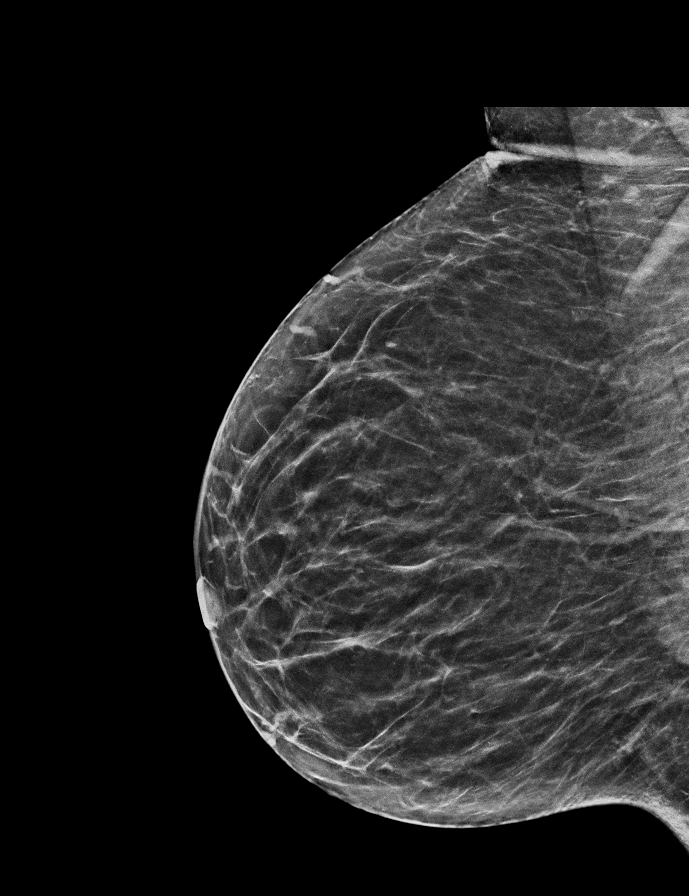

[L CC synth-2D]
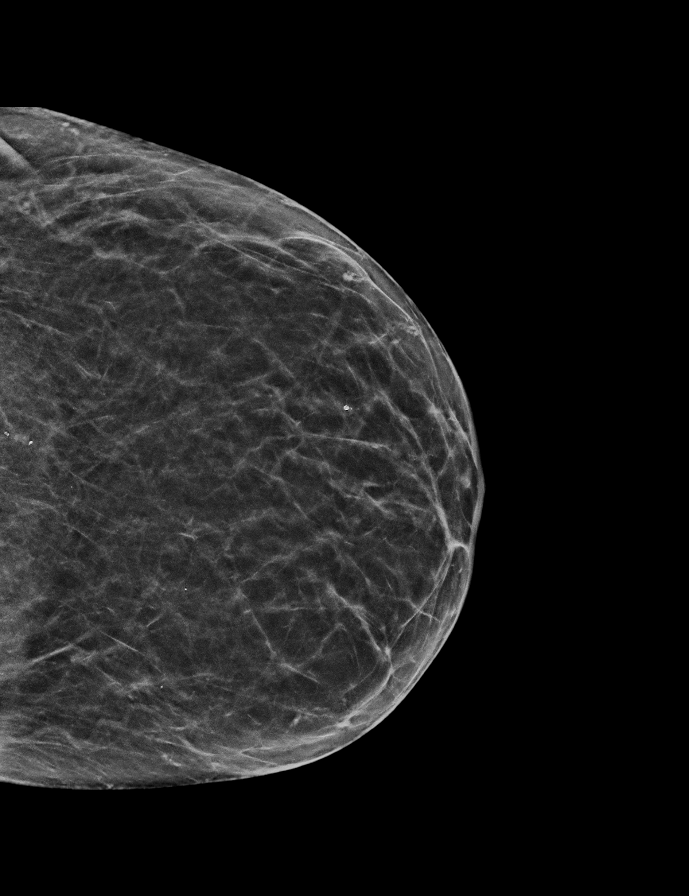

[R CC]
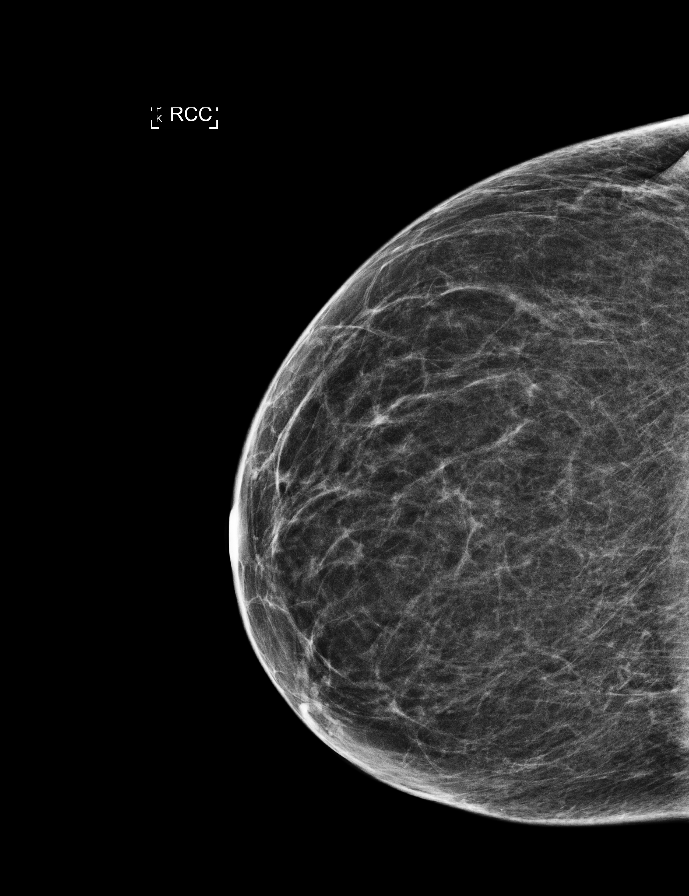

[L MLO synth-2D]
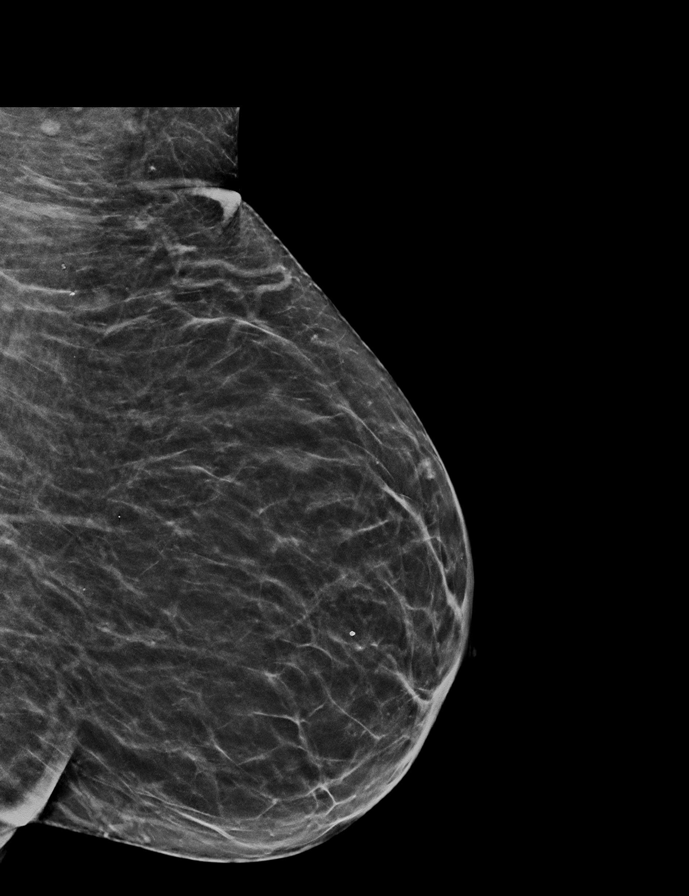

[R MLO]
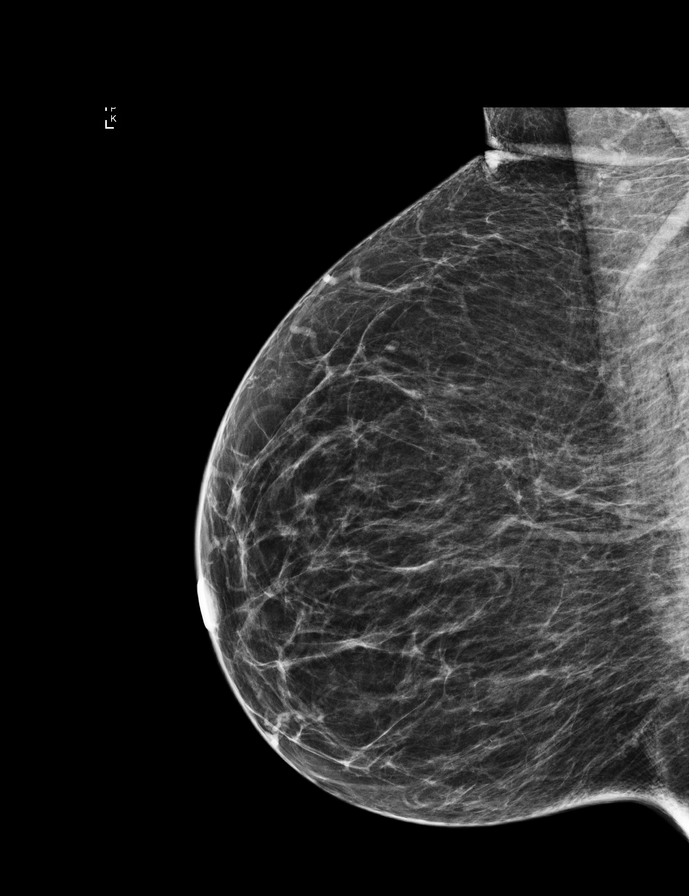

[L MLO tomo · tomo slice 25/48.0]
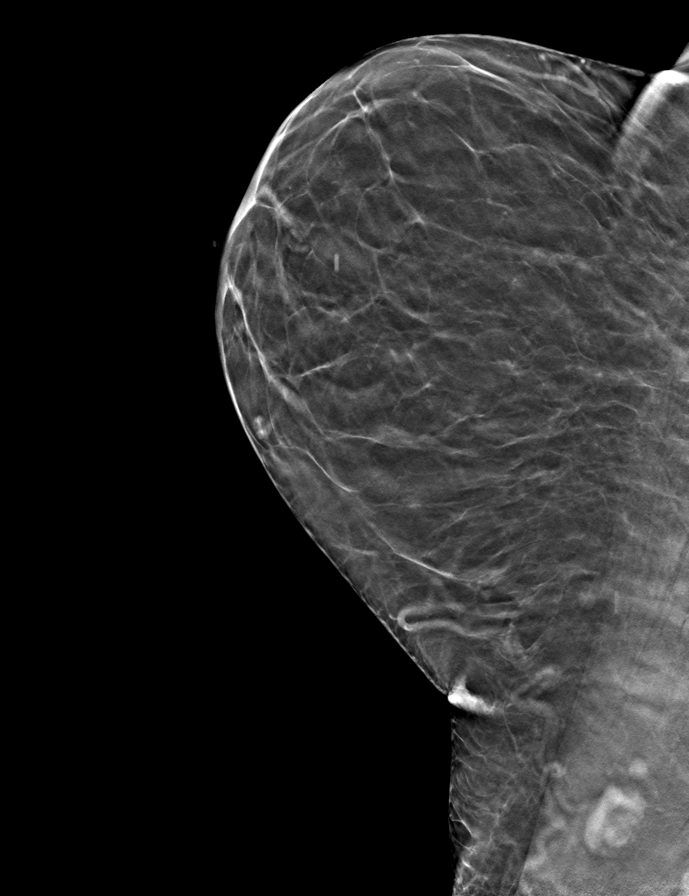

[9 of 28 positions shown; findings below may reference images not displayed]

ACR Breast Density Category b: There are scattered areas of
fibroglandular density.
FINDINGS: There are no findings suspicious for malignancy. Images were
processed with CAD.
IMPRESSION: No mammographic evidence of malignancy. A result letter of this
screening mammogram will be mailed directly to the patient.

RECOMMENDATION:
Screening mammogram in one year. (Code:97-6-RS4)

BI-RADS CATEGORY  1: Negative.

## 2016-07-23 ENCOUNTER — Ambulatory Visit (HOSPITAL_COMMUNITY): Payer: Medicare Other | Attending: Nurse Practitioner

## 2016-07-23 ENCOUNTER — Other Ambulatory Visit: Payer: Self-pay

## 2016-07-23 DIAGNOSIS — I351 Nonrheumatic aortic (valve) insufficiency: Secondary | ICD-10-CM

## 2016-07-23 DIAGNOSIS — R002 Palpitations: Secondary | ICD-10-CM | POA: Diagnosis not present

## 2016-07-23 DIAGNOSIS — K219 Gastro-esophageal reflux disease without esophagitis: Secondary | ICD-10-CM | POA: Diagnosis not present

## 2016-07-23 DIAGNOSIS — R0789 Other chest pain: Secondary | ICD-10-CM

## 2016-07-23 DIAGNOSIS — F419 Anxiety disorder, unspecified: Secondary | ICD-10-CM | POA: Diagnosis not present

## 2016-07-23 DIAGNOSIS — I080 Rheumatic disorders of both mitral and aortic valves: Secondary | ICD-10-CM | POA: Diagnosis not present

## 2016-07-24 ENCOUNTER — Ambulatory Visit (HOSPITAL_COMMUNITY)
Admission: RE | Admit: 2016-07-24 | Discharge: 2016-07-24 | Disposition: A | Discharge status: Home / Self Care | Payer: Medicare Other | Source: Ambulatory Visit | Attending: Cardiology | Admitting: Cardiology

## 2016-07-24 ENCOUNTER — Encounter (HOSPITAL_COMMUNITY): Payer: Self-pay | Admitting: Cardiology

## 2016-07-24 ENCOUNTER — Encounter (HOSPITAL_COMMUNITY)
Admission: RE | Disposition: A | Discharge status: Home / Self Care | Payer: Self-pay | Source: Ambulatory Visit | Attending: Cardiology

## 2016-07-24 DIAGNOSIS — Z79899 Other long term (current) drug therapy: Secondary | ICD-10-CM | POA: Insufficient documentation

## 2016-07-24 DIAGNOSIS — R0600 Dyspnea, unspecified: Secondary | ICD-10-CM | POA: Diagnosis present

## 2016-07-24 DIAGNOSIS — F419 Anxiety disorder, unspecified: Secondary | ICD-10-CM | POA: Diagnosis not present

## 2016-07-24 DIAGNOSIS — I2510 Atherosclerotic heart disease of native coronary artery without angina pectoris: Secondary | ICD-10-CM | POA: Insufficient documentation

## 2016-07-24 DIAGNOSIS — I10 Essential (primary) hypertension: Secondary | ICD-10-CM | POA: Diagnosis not present

## 2016-07-24 DIAGNOSIS — R002 Palpitations: Secondary | ICD-10-CM | POA: Diagnosis not present

## 2016-07-24 DIAGNOSIS — R0609 Other forms of dyspnea: Secondary | ICD-10-CM

## 2016-07-24 DIAGNOSIS — R079 Chest pain, unspecified: Secondary | ICD-10-CM | POA: Insufficient documentation

## 2016-07-24 DIAGNOSIS — K219 Gastro-esophageal reflux disease without esophagitis: Secondary | ICD-10-CM | POA: Diagnosis not present

## 2016-07-24 DIAGNOSIS — F329 Major depressive disorder, single episode, unspecified: Secondary | ICD-10-CM | POA: Insufficient documentation

## 2016-07-24 DIAGNOSIS — Z87891 Personal history of nicotine dependence: Secondary | ICD-10-CM | POA: Insufficient documentation

## 2016-07-24 DIAGNOSIS — R9439 Abnormal result of other cardiovascular function study: Secondary | ICD-10-CM

## 2016-07-24 HISTORY — PX: CARDIAC CATHETERIZATION: SHX172

## 2016-07-24 SURGERY — LEFT HEART CATH AND CORONARY ANGIOGRAPHY
Anesthesia: LOCAL

## 2016-07-24 MED ORDER — SODIUM CHLORIDE 0.9% FLUSH: 3.0000 mL | INTRAVENOUS | Status: DC | PRN

## 2016-07-24 MED ORDER — SODIUM CHLORIDE 0.9 % WEIGHT BASED INFUSION: 1.0000 mL/kg/h | INTRAVENOUS | Status: DC

## 2016-07-24 MED ORDER — HEPARIN SODIUM (PORCINE) 1000 UNIT/ML IJ SOLN
INTRAMUSCULAR | Status: DC | PRN
  Administered 2016-07-24: 3000 [IU] via INTRAVENOUS

## 2016-07-24 MED ORDER — ASPIRIN 81 MG PO CHEW: 81.0000 mg | CHEWABLE_TABLET | ORAL | Status: AC

## 2016-07-24 MED ORDER — MIDAZOLAM HCL 2 MG/2ML IJ SOLN
INTRAMUSCULAR | Status: DC | PRN
  Administered 2016-07-24: 1 mg via INTRAVENOUS

## 2016-07-24 MED ORDER — FENTANYL CITRATE (PF) 100 MCG/2ML IJ SOLN
INTRAMUSCULAR | Status: DC | PRN
  Administered 2016-07-24: 25 ug via INTRAVENOUS

## 2016-07-24 MED ORDER — HEPARIN (PORCINE) IN NACL 2-0.9 UNIT/ML-% IJ SOLN
INTRAMUSCULAR | Status: DC | PRN
  Administered 2016-07-24: 1000 mL

## 2016-07-24 MED ORDER — HEPARIN (PORCINE) IN NACL 2-0.9 UNIT/ML-% IJ SOLN
INTRAMUSCULAR | Status: DC | PRN
  Administered 2016-07-24: 10 mL via INTRA_ARTERIAL

## 2016-07-24 MED ORDER — IOPAMIDOL (ISOVUE-370) INJECTION 76%
INTRAVENOUS | Status: DC | PRN
  Administered 2016-07-24: 70 mL via INTRA_ARTERIAL

## 2016-07-24 MED ORDER — HEPARIN SODIUM (PORCINE) 1000 UNIT/ML IJ SOLN
INTRAMUSCULAR | Status: AC
  Filled 2016-07-24: qty 1

## 2016-07-24 MED ORDER — SODIUM CHLORIDE 0.9% FLUSH: 3.0000 mL | Freq: Two times a day (BID) | INTRAVENOUS | Status: DC

## 2016-07-24 MED ORDER — HEPARIN (PORCINE) IN NACL 2-0.9 UNIT/ML-% IJ SOLN
INTRAMUSCULAR | Status: AC
  Filled 2016-07-24: qty 1000

## 2016-07-24 MED ORDER — FENTANYL CITRATE (PF) 100 MCG/2ML IJ SOLN
INTRAMUSCULAR | Status: AC
  Filled 2016-07-24: qty 2

## 2016-07-24 MED ORDER — MIDAZOLAM HCL 2 MG/2ML IJ SOLN
INTRAMUSCULAR | Status: AC
  Filled 2016-07-24: qty 2

## 2016-07-24 MED ORDER — SODIUM CHLORIDE 0.9 % IV SOLN: 250.0000 mL | INTRAVENOUS | Status: DC | PRN

## 2016-07-24 MED ORDER — LIDOCAINE HCL (PF) 1 % IJ SOLN
INTRAMUSCULAR | Status: DC | PRN
  Administered 2016-07-24: 2 mL

## 2016-07-24 MED ORDER — LIDOCAINE HCL (PF) 1 % IJ SOLN
INTRAMUSCULAR | Status: AC
  Filled 2016-07-24: qty 30

## 2016-07-24 MED ORDER — SODIUM CHLORIDE 0.9 % WEIGHT BASED INFUSION
3.0000 mL/kg/h | INTRAVENOUS | Status: AC
  Administered 2016-07-24: 3 mL/kg/h via INTRAVENOUS

## 2016-07-24 MED ORDER — VERAPAMIL HCL 2.5 MG/ML IV SOLN
INTRAVENOUS | Status: AC
  Filled 2016-07-24: qty 2

## 2016-07-24 MED ORDER — NITROGLYCERIN 1 MG/10 ML FOR IR/CATH LAB
INTRA_ARTERIAL | Status: AC
  Filled 2016-07-24: qty 10

## 2016-07-24 MED ORDER — SODIUM CHLORIDE 0.9 % WEIGHT BASED INFUSION: 3.0000 mL/kg/h | INTRAVENOUS | Status: DC

## 2016-07-24 SURGICAL SUPPLY — 10 items
CATH 5FR JL3.5 JR4 ANG PIG MP (CATHETERS) ×2 IMPLANT
DEVICE RAD COMP TR BAND LRG (VASCULAR PRODUCTS) ×2 IMPLANT
GLIDESHEATH SLEND SS 6F .021 (SHEATH) ×2 IMPLANT
GUIDEWIRE INQWIRE 1.5J.035X260 (WIRE) ×2 IMPLANT
INQWIRE 1.5J .035X260CM (WIRE) ×4
KIT HEART LEFT (KITS) ×2 IMPLANT
PACK CARDIAC CATHETERIZATION (CUSTOM PROCEDURE TRAY) ×2 IMPLANT
SYR MEDRAD MARK V 150ML (SYRINGE) ×2 IMPLANT
TRANSDUCER W/STOPCOCK (MISCELLANEOUS) ×2 IMPLANT
TUBING CIL FLEX 10 FLL-RA (TUBING) ×2 IMPLANT

## 2016-07-24 NOTE — Discharge Instructions (Signed)

## 2016-07-24 NOTE — Interval H&P Note (Signed)
History and Physical Interval Note:  07/24/2016 7:25 AM  Erika Wu  has presented today for surgery, with the diagnosis of abnormal stress test  The various methods of treatment have been discussed with the patient and family. After consideration of risks, benefits and other options for treatment, the patient has consented to  Procedure(s): Left Heart Cath and Coronary Angiography (N/A) as a surgical intervention .  The patient's history has been reviewed, patient examined, no change in status, stable for surgery.  I have reviewed the patient's chart and labs.  Questions were answered to the patient's satisfaction.    Cath Lab Visit (complete for each Cath Lab visit)  Clinical Evaluation Leading to the Procedure:   ACS: No.  Non-ACS:    Anginal Classification: CCS II  Anti-ischemic medical therapy: Minimal Therapy (1 class of medications)  Non-Invasive Test Results: Intermediate-risk stress test findings: cardiac mortality 1-3%/year  Prior CABG: No previous CABG       Collier Salina [facility] 07/24/2016 7:28 AM

## 2016-07-24 NOTE — H&P (View-Only) (Signed)
Cardiology Office Note   Date:  07/19/2016   ID:  Erika Wu, DOB [birth year], MRN [id number]  PCP:  Criselda Peaches, MD  Cardiologist:  Dr Moise Boring Martinique  Barrett, Rhonda, PA-C   Chief Complaint  Patient presents with  . Follow-up    discuss stress test results    History of Present Illness: Erika Wu is a 70 y.o. 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 Echo not completed, monitor not completed but Myoview was abnormal and early follow-up recommended  Erika Wu presents for follow up of test results.   Her BP has been up since a couple of family issues (mother and son) caused her anxiety to increase about a year ago.   She feels her heart pound at times, rapid but regular since on the monitor. She takes care of her 38 yo mother and cannot leave her overnight.  She has not had true chest pain, Or any exertional symptoms, just feels her heart pound.     Past Medical History:  Diagnosis Date  . Anxiety   . Chest pain    a. 03/2015 Lexiscan MV: EF 59%, no ischemia/infarct.  . Colon polyps   . Depression   . GERD (gastroesophageal reflux disease)   . Hemorrhoids   . Moderate aortic insufficiency    a. 03/2015 Echo: EF 55-60%, no rwma, mild LVH, Gr 1 DD, mild AS, mod AI (bicuspid AoV by report, poorly visualized on this study), Asc Ao diam 58mm, triv MR.    Past Surgical History:  Procedure Laterality Date  . COLONOSCOPY    . TONSILLECTOMY AND ADENOIDECTOMY    . TUBAL LIGATION      Current Outpatient Prescriptions  Medication Sig Dispense Refill  . buPROPion (WELLBUTRIN XL) 150 MG 24 hr tablet Take 450 mg by mouth every morning.  3  . clonazePAM (KLONOPIN) 0.5 MG tablet Take 0.5 mg by mouth at bedtime.    . mirtazapine (REMERON) 30 MG tablet Take 30 mg by mouth at bedtime.    .  pantoprazole (PROTONIX) 40 MG tablet Take 40 mg by mouth daily as needed (acid reflux).    . temazepam (RESTORIL) 15 MG capsule Take 15 mg by mouth at bedtime.     No current facility-administered medications for this visit.     Allergies:   Patient has no known allergies.    Social History:  The patient  reports that she has quit smoking. Her smoking use included Cigarettes. She has a 7.50 pack-year smoking history. She has never used smokeless tobacco. She reports that she drinks alcohol. She reports that she does not use drugs.   Family History:  The patient's family history includes Colon cancer in her maternal grandmother; Diverticulosis in her maternal grandfather; HIV in her brother; Heart attack in her father; Heart disease in her father; Stomach cancer in her maternal grandmother; Stroke in her paternal grandfather.    ROS:  Please see the history of present illness. All other systems are reviewed and negative.    PHYSICAL EXAM: VS:  BP (!) 160/82   Pulse 76   Ht 5' 1.2" (1.554 m)   Wt 116 lb 12.8 oz (53 kg)   SpO2 98%   BMI 21.93 kg/m  , BMI Body mass index is 21.93 kg/m. GEN:  Well nourished, well developed, female in no acute distress  HEENT: normal for age  Neck: no JVD, no carotid bruit, no masses Cardiac: RRR; 2/6 SEM & 2/6 DM, no rubs, or gallops Respiratory:  clear to auscultation bilaterally, normal work of breathing GI: soft, nontender, nondistended, + BS MS: no deformity or atrophy; no edema; distal pulses are 2+ in all 4 extremities   Skin: warm and dry, no rash Neuro:  Strength and sensation are intact Psych: euthymic mood, full affect   EKG:  EKG is not ordered today.  MYOVIEW: 07/13/2016  Nuclear stress EF: 60%.  The left ventricular ejection fraction is normal (55-65%).  There was no ST segment deviation noted during stress.  There is a small defect of mild severity present in the apical lateral and apical location. The defect is reversible and  is consistent with a small area of ischemia although shifting breast attenuation artifact. is also a possiblity.  This is an intermediate risk study due to apical reversible defect.   Recent Labs: No results found for requested labs within last 8760 hours.    Lipid Panel No results found for: CHOL, TRIG, HDL, CHOLHDL, VLDL, LDLCALC, LDLDIRECT   Wt Readings from Last 3 Encounters:  07/19/16 116 lb 12.8 oz (53 kg)  07/13/16 115 lb (52.2 kg)  07/03/16 115 lb 12.8 oz (52.5 kg)     Other studies Reviewed: Additional studies/ records that were reviewed today include: Office notes and testing.  ASSESSMENT AND PLAN:  1.  Chest pain, abnormal Myoview: I discussed the test results with her and recommended cardiac catheterization. I advised her that if she did not want the cardiac catheterization, we would treat her medically as though she had heart disease, but would never know. I also advised that it is possible this was a false positive and her cath could be clean. I discussed the process and how it would be possible for her to go home after the procedure if it was clean, but she would need to stay overnight if she got a stent   The risks and benefits of a cardiac catheterization including, but not limited to, death, stroke, MI, kidney damage and bleeding were discussed with the patient who indicates understanding and agrees to proceed.   She is anxious about the procedure, but wants to proceed with it. However, she will have to tell her mother about this and also arrange care for her since she could potentially be admitted to the hospital. She will get labs done today, but call to confirm today or tomorrow. She was advised that the labs would only be good for a week.  Add baby aspirin and a beta blocker to her medication regimen. Check a lipid profile if it has not been done recently.  2. Hypertension: Her blood pressure is not well controlled. This may be partly due to anxiety, but it is  still elevated. We will add metoprolol 25 mg twice a day to her medication regimen.  3. Palpitations: Check a TSH as the patient does not believe this has been done recently.   Current medicines are reviewed at length with the patient today.  The patient does not have concerns regarding medicines.  The following changes have been made:  Add baby aspirin and metoprolol  Labs/ tests ordered today include:   Orders Placed This Encounter  Procedures  . CBC with Differential/Platelet  . Comprehensive metabolic panel  . Protime-INR  . TSH  . Lipid panel  Disposition:   FU with Dr. Martinique  Signed, Lenoard Aden  07/19/2016 9:33 AM    [place] Phone: [phone]; Fax: [phone]  This note was written with the assistance of speech recognition software. Please excuse any transcriptional errors.

## 2016-07-30 NOTE — Research (Signed)
CADLAD Informed Consent   Subject Name: Erika Wu  Subject met inclusion and exclusion criteria.  The informed consent form, study requirements and expectations were reviewed with the subject and questions and concerns were addressed prior to the signing of the consent form.  The subject verbalized understanding of the trail requirements.  The subject agreed to participate in the CADLAD trial and signed the informed consent.  The informed consent was obtained prior to performance of any protocol-specific procedures for the subject.  A copy of the signed informed consent was given to the subject and a copy was placed in the subject's medical record.  Sandie Ano 07/30/2016, 7am

## 2016-08-01 ENCOUNTER — Ambulatory Visit: Payer: Medicare Other | Admitting: Physician Assistant

## 2016-08-08 ENCOUNTER — Encounter: Payer: Self-pay | Admitting: Physician Assistant

## 2016-08-08 ENCOUNTER — Ambulatory Visit (INDEPENDENT_AMBULATORY_CARE_PROVIDER_SITE_OTHER): Payer: Medicare Other | Admitting: Physician Assistant

## 2016-08-08 VITALS — BP 102/50 | HR 72 | Ht 62.0 in | Wt 116.8 lb

## 2016-08-08 DIAGNOSIS — R002 Palpitations: Secondary | ICD-10-CM

## 2016-08-08 DIAGNOSIS — I351 Nonrheumatic aortic (valve) insufficiency: Secondary | ICD-10-CM

## 2016-08-08 MED ORDER — METOPROLOL SUCCINATE ER 25 MG PO TB24: 25.0000 mg | ORAL_TABLET | Freq: Every day | ORAL | 3 refills | Status: DC

## 2016-08-08 NOTE — Progress Notes (Signed)
Cardiology Office Note   Date:  08/08/2016   ID:  IDELLA Wu, DOB December 03, 1945, MRN [id number]  PCP:  Criselda Peaches, MD  Cardiologist:   Dr Brackbill>>Dr Martinique (did her cath) and will follow her Barrett, Erika Wu 07/19/2016   History of Present Illness: Erika Wu is a 70 y.o. 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 Monitor applied 11/01, in process MV was abnl>>cath w/ clean cors Echo showed severe AI>>F/u in office  Erika Wu presents for f/u of testing. She is still wearing the monitor.   She feels low energy today. She had 2 cups of coffee and 1 water today. Only had a ham sandwich to eat all day. She does not eat well. She cares for her mother, who has dietary and dysphagia restrictions. Pt does not feel like making 2 meals all the time.   She agrees that she needs more outings and better eating habits. She is aware that she has lost > 20 lbs because of poor eating habits.   She feels generally better. She has not has any more episodes of palpitations since wearing the monitor, but is ok with that. She is focused on not letting external forces affect her physical health.  She has no history of presyncope or orthostatic symptoms of any type. She frequently misses the PM dose of metoprolol, just forgets to take it.  In addition to caring for her mother full time, she recently found out her son is addicted to pain pills. She is very concerned about this.   Past Medical History:  Diagnosis Date  . Anxiety   . Chest pain    a. 03/2015 Lexiscan MV: EF 59%, no ischemia/infarct.  . Colon polyps   . Depression   . GERD (gastroesophageal reflux disease)   . Hemorrhoids   . Moderate aortic insufficiency    a. 03/2015 Echo: EF 55-60%, no rwma, mild LVH, Gr 1 DD, mild AS, mod AI (bicuspid AoV by report, poorly visualized on this study), Asc Ao diam 35mm,  triv MR.    Past Surgical History:  Procedure Laterality Date  . CARDIAC CATHETERIZATION N/A 07/24/2016   Procedure: Left Heart Cath and Coronary Angiography;  Surgeon: Peter M Martinique, MD;  Location: [facility];  Service: Cardiovascular;  Laterality: N/A;  . COLONOSCOPY    . TONSILLECTOMY AND ADENOIDECTOMY    . TUBAL LIGATION      Current Outpatient Prescriptions  Medication Sig Dispense Refill  . aspirin EC 81 MG tablet Take 1 tablet (81 mg total) by mouth daily.    Marland Kitchen buPROPion (WELLBUTRIN XL) 150 MG 24 hr tablet Take 450 mg by mouth every morning.  3  . clindamycin (CLEOCIN T) 1 % lotion Apply 1 application topically daily as needed.    . clonazePAM (KLONOPIN) 0.5 MG tablet Take 0.5 mg by mouth at bedtime.    . magnesium oxide (MAG-OX) 400 MG tablet Take 400 mg by mouth every evening.    . metoprolol tartrate (LOPRESSOR) 25 MG tablet Take 1 tablet (25 mg total) by mouth 2 (two) times daily. 180 tablet 3  . mirtazapine (REMERON) 30 MG tablet Take 30 mg by mouth at bedtime.    . pantoprazole (PROTONIX) 40 MG tablet Take 40 mg by mouth daily as needed (acid reflux).    . temazepam (  RESTORIL) 15 MG capsule Take 15 mg by mouth at bedtime.    . tretinoin (RETIN-A) 0.025 % cream Apply 1 application topically daily as needed.     No current facility-administered medications for this visit.     Allergies:   Patient has no known allergies.    Social History:  The patient  reports that she has quit smoking. Her smoking use included Cigarettes. She has a 7.50 pack-year smoking history. She has never used smokeless tobacco. She reports that she drinks alcohol. She reports that she does not use drugs.   Family History:  The patient's family history includes Colon cancer in her maternal grandmother; Diverticulosis in her maternal grandfather; HIV in her brother; Heart attack in her father; Heart disease in her father; Stomach cancer in her maternal grandmother; Stroke in her paternal  grandfather.    ROS:  Please see the history of present illness. All other systems are reviewed and negative.    PHYSICAL EXAM: VS:  BP (!) 102/50   Pulse 72   Ht 5\' 2"  (1.575 m)   Wt 116 lb 12.8 oz (53 kg)   BMI 21.36 kg/m  , BMI Body mass index is 21.36 kg/m. GEN: Well nourished, well developed, female in no acute distress  HEENT: normal for age  Neck: no JVD, no carotid bruit, no masses Cardiac: RRR; no murmur, no rubs, or gallops Respiratory:  clear to auscultation bilaterally, normal work of breathing GI: soft, nontender, nondistended, + BS MS: no deformity or atrophy; no edema; distal pulses are 2+ in all 4 extremities   Skin: warm and dry, no rash Neuro:  Strength and sensation are intact Psych: euthymic mood, full affect   EKG:  EKG is not ordered today.  ECHO: 11/13 - Left ventricle: The cavity size was normal. There was mild focal   basal hypertrophy of the septum. Systolic function was normal.   The estimated ejection fraction was in the range of 60% to 65%.   Wall motion was normal; there were no regional wall motion   abnormalities. - Aortic valve: Probably trileaflet; moderately thickened,   moderately calcified leaflets. Transvalvular velocity was   minimally increased. There was no stenosis. There was severe   regurgitation. - Aorta: Ascending aortic diameter: 44 mm (S). - Aortic root: The aortic root was moderately dilated. - Mitral valve: There was mild regurgitation.  CATH: 11/14  The left ventricular systolic function is normal.  LV end diastolic pressure is normal.  The left ventricular ejection fraction is 55-65% by visual estimate.  Prox LAD lesion, 20 %stenosed.  Prox RCA to Mid RCA lesion, 10 %stenosed.  1. Mild nonobstructive CAD 2. Normal LV function 3. Normal LVEDP Plan: medical management.   Recent Labs: 07/19/2016: ALT 21; BUN 12; Creat 0.62; Hemoglobin 13.9; Platelets 233; Potassium 4.6; Sodium 141; TSH 0.84    Lipid  Panel    Component Value Date/Time   CHOL 190 07/19/2016 1050   TRIG 77 07/19/2016 1050   HDL 86 07/19/2016 1050   CHOLHDL 2.2 07/19/2016 1050   VLDL 15 07/19/2016 1050   LDLCALC 89 07/19/2016 1050     Wt Readings from Last 3 Encounters:  08/08/16 116 lb 12.8 oz (53 kg)  07/24/16 116 lb (52.6 kg)  07/19/16 116 lb 12.8 oz (53 kg)     Other studies Reviewed: Additional studies/ records that were reviewed today include: Office notes, hospital records and testing.  ASSESSMENT AND PLAN:  1.  Palpitations: complete event monitor and  f/u on the results. Continue BB w/ change from Lopressor 25 mg bid to Toprol XL 25 mg qd as BP is low-nl on half dose BB.  2. Chest pain: nl cors at cath after abnl MV. She is encouraged to continue working on managing her stress. Her chest pain has resolved. No further evaluation indicated.   3. 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 Follow this by echo yearly or per Dr Martinique   Current medicines are reviewed at length with the patient today.  The patient has concerns regarding medicines. Concerns were addressed  The following changes have been made:  Change BB  Labs/ tests ordered today include:  No orders of the defined types were placed in this encounter.    Disposition:   FU with Dr Martinique  Signed, Rosaria Ferries, PA-C  08/08/2016 5:13 PM    [facility] Phone: [phone]; Fax: [phone]  This note was written with the assistance of speech recognition software. Please excuse any transcriptional errors.

## 2016-08-08 NOTE — Patient Instructions (Signed)
Medication Instructions:   STOP METOPROLOL TARTRATE  START METOPROLOL SUCC ER 25 MG ONCE DAILY AT BEDTIME  Follow-Up:  Your physician recommends that you schedule a follow-up appointment WITH DR Martinique AS SCHEDULED  EAT REGULAR MEALS AND GET REGULAR EXERCISE  ENGAGE IN BRAIN STIMULATION AND SOCIAL ACTIVITIES SEVERAL TIMES WEEKLY

## 2016-10-07 NOTE — Progress Notes (Signed)
Cardiology Office Note    Date:  10/08/2016   ID:  Erika Wu, Erika Wu [birth year], MRN [id number]  PCP:  Enrique Sack, MD  Cardiologist:  Deondra Labrador [place], MD    History of Present Illness:  Erika Wu is a 71 y.o. female seen for follow up chest pain and palpitations. She is a former patient of Dr. Patty Sermons. She also has a history of moderate aortic stenosis. She had a normal Myoview study in July 2016. She also had an Echo at that time showing Aortic root enlargement with moderate  AI. This was stable from prior exams. Seen in October 2017 with symptoms of chest pain and palpitations. Echo reported severe AI with dilated aorta of 4.4 cm. EF and LV dimensions were normal. Myoview study was abnormal with evidence of apical ischemia. This led to a cardiac cath in November 2017 showing minimal nonobstructive CAD. Normal LV function and normal LVEDP. She also wore an event monitor that was normal with no arrhythmia.   On follow up today she is doing well. No chest pain. Palpitations are much better. She states she just gets stressed at times. No dizziness. No SOB.    Past Medical History:  Diagnosis Date  . Anxiety   . Chest pain    a. 03/2015 Lexiscan MV: EF 59%, no ischemia/infarct.  . Colon polyps   . Depression   . GERD (gastroesophageal reflux disease)   . Hemorrhoids   . Moderate aortic insufficiency    a. 03/2015 Echo: EF 55-60%, no rwma, mild LVH, Gr 1 DD, mild AS, mod AI (bicuspid AoV by report, poorly visualized on this study), Asc Ao diam 44mm, triv MR.    Past Surgical History:  Procedure Laterality Date  . CARDIAC CATHETERIZATION N/A 07/24/2016   Procedure: Left Heart Cath and Coronary Angiography;  Surgeon: Moncerrat Burnstein M [place], MD;  Location: [facility];  Service: Cardiovascular;  Laterality: N/A;  . COLONOSCOPY    . TONSILLECTOMY AND ADENOIDECTOMY    . TUBAL LIGATION      Current Medications: Outpatient Medications Prior to Visit  Medication Sig Dispense  Refill  . aspirin EC 81 MG tablet Take 1 tablet (81 mg total) by mouth daily.    Marland Kitchen buPROPion (WELLBUTRIN XL) 150 MG 24 hr tablet Take 450 mg by mouth every morning.  3  . clindamycin (CLEOCIN T) 1 % lotion Apply 1 application topically daily as needed.    . clonazePAM (KLONOPIN) 0.5 MG tablet Take 0.5 mg by mouth at bedtime.    . magnesium oxide (MAG-OX) 400 MG tablet Take 400 mg by mouth every evening.    . metoprolol succinate (TOPROL XL) 25 MG 24 hr tablet Take 1 tablet (25 mg total) by mouth daily. 90 tablet 3  . mirtazapine (REMERON) 30 MG tablet Take 30 mg by mouth at bedtime.    . pantoprazole (PROTONIX) 40 MG tablet Take 40 mg by mouth daily as needed (acid reflux).    . temazepam (RESTORIL) 15 MG capsule Take 15 mg by mouth at bedtime.    . tretinoin (RETIN-A) 0.025 % cream Apply 1 application topically daily as needed.     No facility-administered medications prior to visit.      Allergies:   Patient has no known allergies.   Social History   Social History  . Marital status: Single    Spouse name: N/A  . Number of children: 2  . Years of education: N/A   Occupational History  .  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   Social History Main Topics  . Smoking status: Former Smoker    Packs/day: 0.50    Years: 15.00    Types: Cigarettes  . Smokeless tobacco: Never Used  . Alcohol use 0.0 oz/week     Comment: 2-3 per day  . Drug use: No  . Sexual activity: Not Asked   Other Topics Concern  . None   Social History Narrative  . None     Family History:  The patient's family history includes Colon cancer in her maternal grandmother; Diverticulosis in her maternal grandfather; HIV in her brother; Heart attack in her father; Heart disease in her father; Stomach cancer in her maternal grandmother; Stroke in her paternal grandfather.   ROS:   Please see the history of present illness.    ROS All other systems reviewed and are  negative.   PHYSICAL EXAM:   VS:  BP 128/64   Pulse 71   Ht 5\' 2"  (1.575 m)   Wt 119 lb (54 kg)   BMI 21.77 kg/m    GEN: Well nourished, well developed, in no acute distress  HEENT: normal  Neck: no JVD, carotid bruits, or masses Cardiac: RRR; there is a gr 1-2/6 systolic ejection murmur RUSB with a blowing 3/6 diastolic murmur, no edema  Respiratory:  clear to auscultation bilaterally, normal work of breathing GI: soft, nontender, nondistended, + BS MS: no deformity or atrophy  Skin: warm and dry, no rash Neuro:  Alert and Oriented x 3, Strength and sensation are intact Psych: euthymic mood, full affect  Wt Readings from Last 3 Encounters:  10/08/16 119 lb (54 kg)  08/08/16 116 lb 12.8 oz (53 kg)  07/24/16 116 lb (52.6 kg)      Studies/Labs Reviewed:   EKG:  EKG is not ordered today.  The ekg ordered today demonstrates N/A  Recent Labs: 07/19/2016: ALT 21; BUN 12; Creat 0.62; Hemoglobin 13.9; Platelets 233; Potassium 4.6; Sodium 141; TSH 0.84   Lipid Panel    Component Value Date/Time   CHOL 190 07/19/2016 1050   TRIG 77 07/19/2016 1050   HDL 86 07/19/2016 1050   CHOLHDL 2.2 07/19/2016 1050   VLDL 15 07/19/2016 1050   LDLCALC 89 07/19/2016 1050    Additional studies/ records that were reviewed today include:  Myoview 07/13/16:Study Highlights    Nuclear stress EF: 60%.  The left ventricular ejection fraction is normal (55-65%).  There was no ST segment deviation noted during stress.  There is a small defect of mild severity present in the apical lateral and apical location. The defect is reversible and is consistent with a small area of ischemia although shifting breast attenuation artifact. is also a possiblity.  This is an intermediate risk study due to apical reversible defect.   Echo: 07/23/16:Study Conclusions  - Left ventricle: The cavity size was normal. There was mild focal   basal hypertrophy of the septum. Systolic function was normal.   The  estimated ejection fraction was in the range of 60% to 65%.   Wall motion was normal; there were no regional wall motion   abnormalities. - Aortic valve: Probably trileaflet; moderately thickened,   moderately calcified leaflets. Transvalvular velocity was   minimally increased. There was no stenosis. There was severe   regurgitation. - Aorta: Ascending aortic diameter: 44 mm (S). - Aortic root: The aortic root was moderately dilated. - Mitral valve: There was mild regurgitation.  Cardiac cath 07/24/16:Conclusion     The left ventricular systolic function is normal.  LV end diastolic pressure is normal.  The left ventricular ejection fraction is 55-65% by visual estimate.  Prox LAD lesion, 20 %stenosed.  Prox RCA to Mid RCA lesion, 10 %stenosed.   1. Mild nonobstructive CAD 2. Normal LV function 3. Normal LVEDP  Plan: medical management.   Event monitor November 2017: normal   ASSESSMENT:    1. Aortic valve insufficiency, etiology of cardiac valve disease unspecified   2. Heart palpitations   3. Essential hypertension      PLAN:  In order of problems listed above:  1. She has moderate to severe AI but normal LV dimensions and EF. No symptoms. Will plan to follow with yearly Echos. 2. No arrhythmia noted on event monitor. Continue Toprol 3. BP is well controlled.    Medication Adjustments/Labs and Tests Ordered: Current medicines are reviewed at length with the patient today.  Concerns regarding medicines are outlined above.  Medication changes, Labs and Tests ordered today are listed in the Patient Instructions below. Patient Instructions  Continue your current therapy  I will see you again in 6 months.    Signed, Saori Umholtz [place], MD  10/08/2016 2:20 PM    [facility] [address] [place], [place], [address] [phone]

## 2016-10-08 ENCOUNTER — Encounter: Payer: Self-pay | Admitting: Cardiology

## 2016-10-08 ENCOUNTER — Ambulatory Visit (INDEPENDENT_AMBULATORY_CARE_PROVIDER_SITE_OTHER): Payer: Medicare Other | Admitting: Cardiology

## 2016-10-08 VITALS — BP 128/64 | HR 71 | Ht 62.0 in | Wt 119.0 lb

## 2016-10-08 DIAGNOSIS — I351 Nonrheumatic aortic (valve) insufficiency: Secondary | ICD-10-CM

## 2016-10-08 DIAGNOSIS — R002 Palpitations: Secondary | ICD-10-CM | POA: Diagnosis not present

## 2016-10-08 DIAGNOSIS — I10 Essential (primary) hypertension: Secondary | ICD-10-CM | POA: Diagnosis not present

## 2016-10-08 NOTE — Patient Instructions (Signed)
Continue your current therapy  I will see you again in 6 months.   

## 2017-07-29 ENCOUNTER — Other Ambulatory Visit: Payer: Self-pay | Admitting: Physician Assistant

## 2017-07-30 NOTE — Telephone Encounter (Signed)
REFILL 

## 2017-10-31 ENCOUNTER — Other Ambulatory Visit: Payer: Self-pay | Admitting: Physician Assistant

## 2017-11-23 ENCOUNTER — Other Ambulatory Visit: Payer: Self-pay | Admitting: Physician Assistant

## 2017-11-25 NOTE — Telephone Encounter (Signed)
REFILL 

## 2017-11-25 NOTE — Telephone Encounter (Signed)
This is Dr. Jordan's pt. °

## 2018-02-28 ENCOUNTER — Other Ambulatory Visit: Payer: Self-pay | Admitting: Internal Medicine

## 2018-02-28 DIAGNOSIS — Z1231 Encounter for screening mammogram for malignant neoplasm of breast: Secondary | ICD-10-CM

## 2018-03-03 ENCOUNTER — Other Ambulatory Visit: Payer: Self-pay | Admitting: Internal Medicine

## 2018-03-03 DIAGNOSIS — M8580 Other specified disorders of bone density and structure, unspecified site: Secondary | ICD-10-CM

## 2018-03-04 ENCOUNTER — Ambulatory Visit
Admission: RE | Admit: 2018-03-04 | Discharge: 2018-03-04 | Disposition: A | Discharge status: Home / Self Care | Payer: Medicare Other | Source: Ambulatory Visit | Attending: Internal Medicine | Admitting: Internal Medicine

## 2018-03-04 DIAGNOSIS — Z1231 Encounter for screening mammogram for malignant neoplasm of breast: Secondary | ICD-10-CM

## 2018-03-20 ENCOUNTER — Ambulatory Visit: Payer: Medicare Other

## 2018-03-24 ENCOUNTER — Ambulatory Visit
Admission: RE | Admit: 2018-03-24 | Discharge: 2018-03-24 | Disposition: A | Discharge status: Home / Self Care | Payer: Medicare Other | Source: Ambulatory Visit | Attending: Internal Medicine | Admitting: Internal Medicine

## 2018-03-24 DIAGNOSIS — M8580 Other specified disorders of bone density and structure, unspecified site: Secondary | ICD-10-CM

## 2018-03-25 ENCOUNTER — Other Ambulatory Visit (HOSPITAL_COMMUNITY): Payer: Self-pay | Admitting: Internal Medicine

## 2018-03-25 DIAGNOSIS — I351 Nonrheumatic aortic (valve) insufficiency: Secondary | ICD-10-CM

## 2018-03-31 ENCOUNTER — Ambulatory Visit (HOSPITAL_COMMUNITY)
Admission: RE | Admit: 2018-03-31 | Discharge: 2018-03-31 | Disposition: A | Discharge status: Home / Self Care | Payer: Medicare Other | Source: Ambulatory Visit | Attending: Internal Medicine | Admitting: Internal Medicine

## 2018-03-31 DIAGNOSIS — I77810 Thoracic aortic ectasia: Secondary | ICD-10-CM | POA: Diagnosis not present

## 2018-03-31 DIAGNOSIS — R079 Chest pain, unspecified: Secondary | ICD-10-CM | POA: Insufficient documentation

## 2018-03-31 DIAGNOSIS — I2510 Atherosclerotic heart disease of native coronary artery without angina pectoris: Secondary | ICD-10-CM | POA: Insufficient documentation

## 2018-03-31 DIAGNOSIS — I351 Nonrheumatic aortic (valve) insufficiency: Secondary | ICD-10-CM | POA: Diagnosis present

## 2018-03-31 DIAGNOSIS — I080 Rheumatic disorders of both mitral and aortic valves: Secondary | ICD-10-CM | POA: Insufficient documentation

## 2018-03-31 DIAGNOSIS — K219 Gastro-esophageal reflux disease without esophagitis: Secondary | ICD-10-CM | POA: Insufficient documentation

## 2018-03-31 DIAGNOSIS — R002 Palpitations: Secondary | ICD-10-CM | POA: Insufficient documentation

## 2018-03-31 NOTE — Progress Notes (Signed)
  Echocardiogram 2D Echocardiogram has been performed.  Darlina Sicilian M 03/31/2018, 2:49 PM

## 2018-09-24 ENCOUNTER — Other Ambulatory Visit: Payer: Self-pay | Admitting: Physician Assistant

## 2018-09-24 NOTE — Telephone Encounter (Signed)
We can refill once but she will need to be seen after.  Peter Martinique MD, [facility]

## 2018-10-14 ENCOUNTER — Ambulatory Visit: Payer: Medicare Other | Admitting: Cardiology

## 2018-11-05 ENCOUNTER — Other Ambulatory Visit: Payer: Self-pay

## 2018-11-06 ENCOUNTER — Other Ambulatory Visit: Payer: Self-pay

## 2018-11-06 MED ORDER — METOPROLOL SUCCINATE ER 25 MG PO TB24: 25.0000 mg | ORAL_TABLET | Freq: Every day | ORAL | 0 refills | Status: DC

## 2018-11-28 ENCOUNTER — Other Ambulatory Visit: Payer: Self-pay

## 2018-11-28 MED ORDER — METOPROLOL SUCCINATE ER 25 MG PO TB24: 25.0000 mg | ORAL_TABLET | Freq: Every day | ORAL | 0 refills | Status: DC

## 2018-11-28 NOTE — Telephone Encounter (Signed)
Rx(s) sent to pharmacy electronically.  

## 2019-01-02 ENCOUNTER — Encounter: Payer: Self-pay | Admitting: Gastroenterology

## 2019-02-11 ENCOUNTER — Encounter: Payer: Self-pay | Admitting: Gastroenterology

## 2019-03-25 ENCOUNTER — Telehealth: Payer: Self-pay | Admitting: Cardiology

## 2019-03-25 NOTE — Telephone Encounter (Signed)
lm 03-25-19 for recall

## 2019-04-17 ENCOUNTER — Other Ambulatory Visit: Payer: Self-pay

## 2019-04-17 MED ORDER — METOPROLOL SUCCINATE ER 25 MG PO TB24: 25.0000 mg | ORAL_TABLET | Freq: Every day | ORAL | 0 refills | Status: DC

## 2019-05-28 ENCOUNTER — Other Ambulatory Visit: Payer: Self-pay

## 2019-06-01 ENCOUNTER — Other Ambulatory Visit: Payer: Self-pay

## 2019-06-03 ENCOUNTER — Other Ambulatory Visit: Payer: Self-pay

## 2019-06-03 MED ORDER — METOPROLOL SUCCINATE ER 25 MG PO TB24: 25.0000 mg | ORAL_TABLET | Freq: Every day | ORAL | 0 refills | Status: DC

## 2019-07-10 ENCOUNTER — Other Ambulatory Visit: Payer: Self-pay

## 2019-07-10 MED ORDER — METOPROLOL SUCCINATE ER 25 MG PO TB24: 25.0000 mg | ORAL_TABLET | Freq: Every day | ORAL | 0 refills | Status: DC

## 2019-10-30 ENCOUNTER — Other Ambulatory Visit (HOSPITAL_COMMUNITY): Payer: Self-pay | Admitting: Internal Medicine

## 2019-10-30 DIAGNOSIS — I351 Nonrheumatic aortic (valve) insufficiency: Secondary | ICD-10-CM

## 2019-10-30 DIAGNOSIS — I77810 Thoracic aortic ectasia: Secondary | ICD-10-CM

## 2019-11-17 ENCOUNTER — Other Ambulatory Visit: Payer: Self-pay

## 2019-11-17 ENCOUNTER — Ambulatory Visit (HOSPITAL_COMMUNITY): Payer: Medicare PPO | Attending: Cardiology

## 2019-11-17 ENCOUNTER — Encounter (INDEPENDENT_AMBULATORY_CARE_PROVIDER_SITE_OTHER): Payer: Self-pay

## 2019-11-17 DIAGNOSIS — I351 Nonrheumatic aortic (valve) insufficiency: Secondary | ICD-10-CM | POA: Diagnosis present

## 2019-11-17 DIAGNOSIS — I77810 Thoracic aortic ectasia: Secondary | ICD-10-CM

## 2019-12-03 ENCOUNTER — Other Ambulatory Visit: Payer: Self-pay | Admitting: Internal Medicine

## 2019-12-03 DIAGNOSIS — Z1231 Encounter for screening mammogram for malignant neoplasm of breast: Secondary | ICD-10-CM

## 2019-12-28 ENCOUNTER — Other Ambulatory Visit: Payer: Self-pay

## 2019-12-28 ENCOUNTER — Ambulatory Visit
Admission: RE | Admit: 2019-12-28 | Discharge: 2019-12-28 | Disposition: A | Discharge status: Home / Self Care | Payer: Medicare PPO | Source: Ambulatory Visit | Attending: Internal Medicine | Admitting: Internal Medicine

## 2019-12-28 DIAGNOSIS — Z1231 Encounter for screening mammogram for malignant neoplasm of breast: Secondary | ICD-10-CM

## 2020-01-13 ENCOUNTER — Encounter: Payer: Self-pay | Admitting: Internal Medicine

## 2020-10-17 ENCOUNTER — Other Ambulatory Visit: Payer: Self-pay

## 2020-10-17 ENCOUNTER — Ambulatory Visit (AMBULATORY_SURGERY_CENTER): Payer: Medicare PPO | Admitting: *Deleted

## 2020-10-17 VITALS — Ht 62.0 in | Wt 122.0 lb

## 2020-10-17 DIAGNOSIS — Z86010 Personal history of colonic polyps: Secondary | ICD-10-CM

## 2020-10-17 MED ORDER — SUPREP BOWEL PREP KIT 17.5-3.13-1.6 GM/177ML PO SOLN: 1.0000 | Freq: Once | ORAL | 0 refills | Status: AC

## 2020-10-17 NOTE — Progress Notes (Signed)
Pt verified name, DOB, address and insurance during PV today. Pt mailed instruction packet to included paper to complete and mail back to [facility] with addressed and stamped envelope, Emmi video, copy of consent form to read and not return, and instructions. PV completed over the phone. Pt encouraged to call with questions or issues   No egg or soy allergy known to patient  No issues with past sedation with any surgeries or procedures No intubation problems in the past  No FH of Malignant Hyperthermia No diet pills per patient No home 02 use per patient  No blood thinners per patient   Pt denies issues with constipation - since covid having hard stools-  Having BM daily and using Metamucil    No A fib or A flutter  EMMI video to pt or via [place] 19 guidelines implemented in PV today with Pt and RN  Pt is fully vaccinated  for Covid    Due to the COVID-19 pandemic we are asking patients to follow certain guidelines.  Pt aware of COVID protocols and [facility] guidelines   Pt asking about EGD for [place] and stomach issues- I offered OV prior to Colon and pt wants to do Colon then will discuss with MD about EGD in the Future

## 2020-10-26 ENCOUNTER — Encounter: Payer: Self-pay | Admitting: Gastroenterology

## 2020-10-31 ENCOUNTER — Other Ambulatory Visit: Payer: Self-pay

## 2020-10-31 ENCOUNTER — Ambulatory Visit (AMBULATORY_SURGERY_CENTER): Payer: Medicare PPO | Admitting: Gastroenterology

## 2020-10-31 ENCOUNTER — Encounter: Payer: Self-pay | Admitting: Gastroenterology

## 2020-10-31 VITALS — BP 126/62 | HR 66 | Temp 96.6°F | Resp 21 | Ht 62.0 in | Wt 122.0 lb

## 2020-10-31 DIAGNOSIS — Z86010 Personal history of colonic polyps: Secondary | ICD-10-CM

## 2020-10-31 DIAGNOSIS — D125 Benign neoplasm of sigmoid colon: Secondary | ICD-10-CM | POA: Diagnosis not present

## 2020-10-31 DIAGNOSIS — D127 Benign neoplasm of rectosigmoid junction: Secondary | ICD-10-CM

## 2020-10-31 DIAGNOSIS — D122 Benign neoplasm of ascending colon: Secondary | ICD-10-CM | POA: Diagnosis not present

## 2020-10-31 DIAGNOSIS — D128 Benign neoplasm of rectum: Secondary | ICD-10-CM

## 2020-10-31 MED ORDER — SODIUM CHLORIDE 0.9 % IV SOLN: 500.0000 mL | Freq: Once | INTRAVENOUS | Status: DC

## 2020-10-31 NOTE — Patient Instructions (Signed)
Formation on polyps and hemorrhoids given to you today.  Await pathology results.  Resume previous diet and medications.  YOU HAD AN ENDOSCOPIC PROCEDURE TODAY AT [facility]:   Refer to the procedure report that was given to you for any specific questions about what was found during the examination.  If the procedure report does not answer your questions, please call your gastroenterologist to clarify.  If you requested that your care partner not be given the details of your procedure findings, then the procedure report has been included in a sealed envelope for you to review at your convenience later.  YOU SHOULD EXPECT: Some feelings of bloating in the abdomen. Passage of more gas than usual.  Walking can help get rid of the air that was put into your GI tract during the procedure and reduce the bloating. If you had a lower endoscopy (such as a colonoscopy or flexible sigmoidoscopy) you may notice spotting of blood in your stool or on the toilet paper. If you underwent a bowel prep for your procedure, you may not have a normal bowel movement for a few days.  Please Note:  You might notice some irritation and congestion in your nose or some drainage.  This is from the oxygen used during your procedure.  There is no need for concern and it should clear up in a day or so.  SYMPTOMS TO REPORT IMMEDIATELY:   Following lower endoscopy (colonoscopy or flexible sigmoidoscopy):  Excessive amounts of blood in the stool  Significant tenderness or worsening of abdominal pains  Swelling of the abdomen that is new, acute  Fever of 100F or higher  For urgent or emergent issues, a gastroenterologist can be reached at any hour by calling [phone]. Do not use MyChart messaging for urgent concerns.    DIET:  We do recommend a small meal at first, but then you may proceed to your regular diet.  Drink plenty of fluids but you should avoid alcoholic beverages for 24 hours.  ACTIVITY:   You should plan to take it easy for the rest of today and you should NOT DRIVE or use heavy machinery until tomorrow (because of the sedation medicines used during the test).    FOLLOW UP: Our staff will call the number listed on your records 48-72 hours following your procedure to check on you and address any questions or concerns that you may have regarding the information given to you following your procedure. If we do not reach you, we will leave a message.  We will attempt to reach you two times.  During this call, we will ask if you have developed any symptoms of COVID 19. If you develop any symptoms (ie: fever, flu-like symptoms, shortness of breath, cough etc.) before then, please call [phone].  If you test positive for Covid 19 in the 2 weeks post procedure, please call and report this information to [place].    If any biopsies were taken you will be contacted by phone or by letter within the next 1-3 weeks.  Please call us at [phone] if you have not heard about the biopsies in 3 weeks.    SIGNATURES/CONFIDENTIALITY: You and/or your care partner have signed paperwork which will be entered into your electronic medical record.  These signatures attest to the fact that that the information above on your After Visit Summary has been reviewed and is understood.  Full responsibility of the confidentiality of this discharge information lies with you and/or your  care-partner.

## 2020-10-31 NOTE — Op Note (Signed)
[facility] Patient Name: Erika Wu Procedure Date: 10/31/2020 9:24 AM MRN: [id number] Endoscopist: Thornton Park MD, MD Age: 75 Referring MD:  Date of Birth: 11/29/1945 Gender: Female Account #: [id number] Procedure:                Colonoscopy Indications:              Surveillance: Personal history of adenomatous                            polyps on last colonoscopy > 5 years ago                           History of colon polyps: TVA 2000, TA 2004, TA 2015 Medicines:                Monitored Anesthesia Care Procedure:                Pre-Anesthesia Assessment:                           - Prior to the procedure, a History and Physical                            was performed, and patient medications and                            allergies were reviewed. The patient's tolerance of                            previous anesthesia was also reviewed. The risks                            and benefits of the procedure and the sedation                            options and risks were discussed with the patient.                            All questions were answered, and informed consent                            was obtained. Prior Anticoagulants: The patient has                            taken no previous anticoagulant or antiplatelet                            agents. ASA Grade Assessment: II - A patient with                            mild systemic disease. After reviewing the risks                            and benefits, the patient was deemed in  satisfactory condition to undergo the procedure.                           After obtaining informed consent, the colonoscope                            was passed under direct vision. Throughout the                            procedure, the patient's blood pressure, pulse, and                            oxygen saturations were monitored continuously. The                            Olympus PFC-[id number]  (#[id number]) Colonoscope was                            introduced through the anus and advanced to the 2                            cm into the ileum. A second forward view of the                            right colon was performed. The colonoscopy was                            performed without difficulty. The patient tolerated                            the procedure well. The quality of the bowel                            preparation was good. The terminal ileum, ileocecal                            valve, appendiceal orifice, and rectum were                            photographed. Scope In: 9:40:20 AM Scope Out: 9:55:00 AM Scope Withdrawal Time: 0 hours 11 minutes 5 seconds  Total Procedure Duration: 0 hours 14 minutes 40 seconds  Findings:                 Prolapsed internal hemorrhoids were found on                            perianal exam.                           Three flat polyps were found in the rectum, sigmoid                            colon and ascending colon. The polyps were 1 mm in  size. These polyps were removed with a cold biopsy                            forceps. Resection and retrieval were complete.                            Estimated blood loss was minimal.                           The exam was otherwise without abnormality on                            direct and retroflexion views. Complications:            No immediate complications. Estimated blood loss:                            Minimal. Estimated Blood Loss:     Estimated blood loss was minimal. Impression:               - Hemorrhoids found on perianal exam.                           - Three 1 mm polyps in the rectum, in the sigmoid                            colon and in the ascending colon, removed with a                            cold biopsy forceps. Resected and retrieved.                           - The examination was otherwise normal on direct                             and retroflexion views. Recommendation:           - Patient has a contact number available for                            emergencies. The signs and symptoms of potential                            delayed complications were discussed with the                            patient. Return to normal activities tomorrow.                            Written discharge instructions were provided to the                            patient.                           - Resume previous diet.                           -  Continue present medications.                           - Await pathology results.                           - Repeat colonoscopy date to be determined after                            pending pathology results are reviewed for                            surveillance, if it is clinically appropriate at                            that time as age will be over 55.                           - Emerging evidence supports eating a diet of                            fruits, vegetables, grains, calcium, and yogurt                            while reducing red meat and alcohol may reduce the                            risk of colon cancer.                           - Thank you for allowing me to be involved in your                            colon cancer prevention. Thornton Park MD, MD 10/31/2020 10:02:57 AM This report has been signed electronically.

## 2020-10-31 NOTE — Progress Notes (Signed)
Called to room to assist during endoscopic procedure.  Patient ID and intended procedure confirmed with present staff. Received instructions for my participation in the procedure from the performing physician.  

## 2020-10-31 NOTE — Progress Notes (Signed)
Report to PACU, RN, vss, BBS= Clear.  

## 2020-10-31 NOTE — Progress Notes (Signed)
Pt's states no medical or surgical changes since previsit or office visit.  VS SB

## 2020-11-01 ENCOUNTER — Telehealth: Payer: Self-pay

## 2020-11-01 NOTE — Telephone Encounter (Signed)
Pt scheduled for OV with Dr.Beavers per note written on procedure note stating pt needs OV with Dr. Tarri Glenn. Pt scheduled to see Dr. Tarri Glenn 12/07/20@8 :50am.

## 2020-11-02 ENCOUNTER — Telehealth: Payer: Self-pay

## 2020-11-02 NOTE — Telephone Encounter (Signed)
LVM

## 2020-11-09 ENCOUNTER — Encounter: Payer: Self-pay | Admitting: Gastroenterology

## 2020-12-02 ENCOUNTER — Other Ambulatory Visit: Payer: Self-pay | Admitting: Internal Medicine

## 2020-12-02 DIAGNOSIS — M810 Age-related osteoporosis without current pathological fracture: Secondary | ICD-10-CM

## 2020-12-07 ENCOUNTER — Encounter: Payer: Self-pay | Admitting: Gastroenterology

## 2020-12-07 ENCOUNTER — Ambulatory Visit (INDEPENDENT_AMBULATORY_CARE_PROVIDER_SITE_OTHER): Payer: Medicare PPO | Admitting: Gastroenterology

## 2020-12-07 VITALS — BP 120/70 | HR 60 | Ht 62.0 in | Wt 119.0 lb

## 2020-12-07 DIAGNOSIS — R142 Eructation: Secondary | ICD-10-CM

## 2020-12-07 DIAGNOSIS — R143 Flatulence: Secondary | ICD-10-CM | POA: Diagnosis not present

## 2020-12-07 DIAGNOSIS — R140 Abdominal distension (gaseous): Secondary | ICD-10-CM | POA: Diagnosis not present

## 2020-12-07 MED ORDER — SIMETHICONE 80 MG PO CHEW: 80.0000 mg | CHEWABLE_TABLET | Freq: Four times a day (QID) | ORAL | 0 refills | Status: DC | PRN

## 2020-12-07 NOTE — Progress Notes (Signed)
Referring Provider: Michael Boston, MD Primary Care Physician:  Michael Boston, MD  Reason for Consultation:  Gas, eructation, and bloating   IMPRESSION:  Gas, eructation, and bloating now with nausea and dysphonia    -symptoms persist despite proton pump inhibitor therapy Recent weight loss following the death of her 74 year old mother History of colon polyps    - TVA 1998-11-08, TA 2004, TA 2015, TA and SSP 10/31/20    - surveillance colonoscopy recommended in 2025-11-08 History of esophageal stricture s/p dilation 2008/11/08   PLAN: - Dietary considerations: Avoid carbonation, dairy, and sugar substitutes - Trial of GasEx - Continue pantoprazole - EGD with biopsies to evaluate for H pylori, esophagitis, gastritis, gastric outlet obstruction  - If endoscopy if negative, consider abdominal imaging +/- SIBO breath test - Surveillance colonoscopy 11-08-2025 for her history of polyps  I spent 40 minutes, including in depth chart review, independent review of results, communicating results with the patient directly, face-to-face time with the patient, coordinating care, ordering studies and medications as appropriate, and documentation.  HPI: Erika Wu is a 75 y.o. female who is seen at her request for evaluation of gas, eructation, and bloating. This is our first office visit. I recently met [name] at the time of her surveillance colonoscopy. She has a history of colonoscopy polyps with removal of a tubulovillous adenoma in 11/08/1998, a tubular adenoma in 11/08/2002, a tubular adenoma in Nov 08, 2013, and on her most recently colonoscopy 10/31/20 a tubular adenoma, a sessile serrated polyp, and a hyperplastic polyp.   The history is obtained through the patient and review of her electronic health record. She has anxiety, depression, hypertension, osteoporosis and aortic insufficiency.  She reports a lifetime history of a "nervous" stomach.  Now with at least 20 years of frequent gas, eructation, and flatus.  She has  noted an escalation in the symptoms over the last several months now to include intermittent nausea and dysphonia.  There is temporal association with the exacerbation in symptoms and the death of her mother in 11/08/22.  They live together and her mother died of old age at the age of 67.  She has had a good appetite but notes some weight loss since the death of her mother.  There is no dysphagia, odynophagia, abdominal pain, or change in bowel habits.  There is no blood in the stool.  She drinks 2-3 seltzer waters daily.  She does not regularly consume foods using sugar substitutes.  No known history of lactose intolerance.  Dr. Malka So suggested that she avoid dairy but she loves cheese and ice cream.  Use of Protonix over the last 6 months has provided some relief.  She has not tried any other specific therapies.  She reports regular bowel habits.  She will have 1 formed bowel movement in the morning followed by several smaller less formed bowel movements.  There is an ongoing sense of incomplete evacuation.  No history of manual assistance with defecation.  No blood or mucus in the stool.  Labs from November 14, 2020 showing normal CBC and CMP  No recent abdominal imaging. Abdominal ultrasound 11/08/08 for abdominal pain and reflux was normal.   Prior EGDs include: EGD for dysphagia 01/13/2009: early peptic stricture dilated with an 18 mm Maloney with no heme EGD for dyspepsia 11/11/2013: gastric polyps     Past Medical History:  Diagnosis Date  . Allergy   . Anxiety   . Chest pain    a.  03/2015 Lexiscan MV: EF 59%, no ischemia/infarct.  . Colon polyps   . COVID-19 virus infection    09-22-2020, fully vax'd   . Depression   . GERD (gastroesophageal reflux disease)   . Heart murmur   . Hemorrhoids   . Hypertension   . Moderate aortic insufficiency    a. 03/2015 Echo: EF 55-60%, no rwma, mild LVH, Gr 1 DD, mild AS, mod AI (bicuspid AoV by report, poorly visualized on this study), Asc Ao diam 56m,  triv MR.    Past Surgical History:  Procedure Laterality Date  . CARDIAC CATHETERIZATION N/A 07/24/2016   Procedure: Left Heart Cath and Coronary Angiography;  Surgeon: Peter M JMartinique MD;  [facility];  Service: Cardiovascular;  Laterality: N/A;  . COLONOSCOPY    . POLYPECTOMY    . TONSILLECTOMY AND ADENOIDECTOMY    . TUBAL LIGATION      Current Outpatient Medications  Medication Sig Dispense Refill  . Ascorbic Acid (VITAMIN C) 1000 MG tablet 1 tab by mouth    . buPROPion (WELLBUTRIN XL) 150 MG 24 hr tablet Take 450 mg by mouth every morning.  3  . Cholecalciferol 50 MCG (2000 UT) CAPS once a week.    . metoprolol succinate (TOPROL-XL) 25 MG 24 hr tablet Take 1 tablet (25 mg total) by mouth daily. 15 tablet 0  . mirtazapine (REMERON) 30 MG tablet Take 30 mg by mouth at bedtime.    . pantoprazole (PROTONIX) 20 MG tablet Take 20 mg by mouth daily.    .Marland KitchenPeppermint Oil (IBGARD) 90 MG CPCR See admin instructions.    . rosuvastatin (CRESTOR) 10 MG tablet Take 1 tablet by mouth daily.    . Suvorexant (BELSOMRA) 20 MG TABS Take by mouth.     No current facility-administered medications for this visit.    Allergies as of 12/07/2020  . (No Known Allergies)    Family History  Problem Relation Age of Onset  . Heart attack Father        Died @ 568  Had CABG in his 458's  .Marland KitchenHeart disease Father   . Stomach cancer Maternal Grandmother   . Colon cancer Maternal Grandmother   . Diverticulosis Maternal Grandfather   . Stroke Paternal Grandfather   . HIV Brother   . Hypertension Neg Hx   . Colon polyps Neg Hx   . Esophageal cancer Neg Hx   . Rectal cancer Neg Hx      Review of Systems: 12 system ROS is negative except as noted above with the addition of anxiety, back pain, cough, depression, hearing problems, insomnia.   Physical Exam: General:   Alert,  well-nourished, pleasant and cooperative in NAD, hearing loss Head:  Normocephalic and atraumatic. Eyes:   Sclera clear, no icterus.   Conjunctiva pink. Ears:  Normal auditory acuity. Nose:  No deformity, discharge,  or lesions. Mouth:  No deformity or lesions.   Neck:  Supple; no masses or thyromegaly. Lungs:  Clear throughout to auscultation.   No wheezes. Heart:  Regular rate and rhythm; no murmurs. Abdomen:  Soft, nontender, nondistended, normal bowel sounds, no rebound or guarding. No hepatosplenomegaly.   Rectal:  Deferred  Msk:  Symmetrical. No boney deformities LAD: No inguinal or umbilical LAD Extremities:  No clubbing or edema. Neurologic:  Alert and  oriented x4;  grossly nonfocal Skin:  Intact without significant lesions or rashes. Psych:  Alert and cooperative. Normal mood and affect.    Luwanna Brossman L. Matheau [place]rona,  MD, MPH 12/07/2020, 8:55 AM

## 2020-12-07 NOTE — Patient Instructions (Addendum)
It was a pleasure to see you today. Based on our discussion, I am providing you with my recommendations below:  RECOMMENDATION(S):   . I would like for you to try Gas X to see if this will help to relieve your symptoms. Please purchase this product over the counter. . I am also recommending an Endoscopy to better evaluate your symptoms  ENDOSCOPY:   . You have been scheduled for an endoscopy. Please follow written instructions given to you at your visit today.  INHALERS:   . If you use inhalers (even only as needed), please bring them with you on the day of your procedure.  BMI:  . If you are age 74 or older, your body mass index should be between 23-30. Your Body mass index is 21.77 kg/m. If this is out of the aforementioned range listed, please consider follow up with your Primary Care Provider.  Thank you for trusting me with your gastrointestinal care!    Thornton Park, MD, MPH

## 2020-12-09 NOTE — Progress Notes (Signed)
Cardiology Office Note    Date:  12/15/2020   ID:  Shely, Winthrop 03-03-1946, MRN [id number]  PCP:  Melida Quitter, MD  Cardiologist:  Nike Southers [place], MD    History of Present Illness:  Erika Wu is a 75 y.o. 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 Last seen in January 2018.  She is a former patient of Dr. Patty Sermons. She also has a history of moderate aortic stenosis. She had a normal Myoview study in July 2016. She also had an Echo at that time showing Aortic root enlargement with moderate  AI. This was stable from prior exams. Seen in October 2017 with symptoms of chest pain and palpitations. Echo reported severe AI with dilated aorta of 4.4 cm. EF and LV dimensions were normal. Myoview study was abnormal with evidence of apical ischemia. This led to a cardiac cath in November 2017 showing minimal nonobstructive CAD. Normal LV function and normal LVEDP. She also wore an event monitor that was normal with no arrhythmia.   Last evaluation with Echo in March 2021 showed normal EF with moderate AI.   On follow up today she is doing well. She states sometimes her heart beats fast. She thinks this is related to gas. She gets up and elevates her arms and it goes away. Minimal SOB. Hasn't been as active this year. Had Covid in Jan. No chest pain. Notes father had history of aneurysm and died in his 56s.     Past Medical History:  Diagnosis Date  . Allergy   . Anxiety   . Chest pain    a. 03/2015 Lexiscan MV: EF 59%, no ischemia/infarct.  . Colon polyps   . COVID-19 virus infection    09-22-2020, fully vax'd   . Depression   . GERD (gastroesophageal reflux disease)   . Heart murmur   . Hemorrhoids   . Hypertension   . Moderate aortic insufficiency    a. 03/2015 Echo: EF 55-60%, no rwma, mild LVH, Gr 1 DD, mild AS, mod AI (bicuspid AoV by report, poorly visualized on this study), Asc Ao diam 44mm, triv MR.    Past Surgical History:  Procedure Laterality Date  . CARDIAC  CATHETERIZATION N/A 07/24/2016   Procedure: Left Heart Cath and Coronary Angiography;  Surgeon: Adaleena Mooers M [place], MD;  Location: [facility] INVASIVE CV LAB;  Service: Cardiovascular;  Laterality: N/A;  . COLONOSCOPY    . POLYPECTOMY    . TONSILLECTOMY AND ADENOIDECTOMY    . TUBAL LIGATION      Current Medications: Outpatient Medications Prior to Visit  Medication Sig Dispense Refill  . Ascorbic Acid (VITAMIN C) 1000 MG tablet 1 tab by mouth    . buPROPion (WELLBUTRIN XL) 150 MG 24 hr tablet Take 450 mg by mouth every morning.  3  . Cholecalciferol 50 MCG (2000 UT) CAPS once a week.    . metoprolol succinate (TOPROL-XL) 25 MG 24 hr tablet Take 1 tablet (25 mg total) by mouth daily. 15 tablet 0  . mirtazapine (REMERON) 30 MG tablet Take 30 mg by mouth at bedtime.    . pantoprazole (PROTONIX) 20 MG tablet Take 20 mg by mouth daily.    . rosuvastatin (CRESTOR) 10 MG tablet Take 1 tablet by mouth daily.    . Suvorexant (BELSOMRA) 20 MG TABS Take by mouth.    . simethicone (GAS-X) 80 MG chewable tablet Chew 1 tablet (80 mg total) by mouth every 6 (six) hours as needed for flatulence. 30  tablet 0  . Peppermint Oil (IBGARD) 90 MG CPCR See admin instructions.     No facility-administered medications prior to visit.     Allergies:   Patient has no known allergies.   Social History   Socioeconomic History  . Marital status: Single    Spouse name: Not on file  . Number of children: 2  . Years of education: Not on file  . Highest education level: Not on file  Occupational History  . 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  Tobacco Use  . Smoking status: Former Smoker    Packs/day: 0.50    Years: 15.00    Pack years: 7.50    Types: Cigarettes  . Smokeless tobacco: Never Used  Substance and Sexual Activity  . Alcohol use: Yes    Alcohol/week: 0.0 standard drinks    Comment: 2-3 per day  . Drug use: No  . Sexual activity: Not on file  Other Topics  Concern  . Not on file  Social History Narrative  . Not on file   Social Determinants of Health   Financial Resource Strain: Not on file  Food Insecurity: Not on file  Transportation Needs: Not on file  Physical Activity: Not on file  Stress: Not on file  Social Connections: Not on file     Family History:  The patient's family history includes Colon cancer in her maternal grandmother; Diverticulosis in her maternal grandfather; HIV in her brother; Heart attack in her father; Heart disease in her father; Stomach cancer in her maternal grandmother; Stroke in her paternal grandfather.   ROS:   Please see the history of present illness.    ROS All other systems reviewed and are negative.   PHYSICAL EXAM:   VS:  BP (!) 140/50 (BP Location: Left Arm, Patient Position: Sitting)   Pulse 60   Ht 5\' 3"  (1.6 m)   Wt 119 lb 12.8 oz (54.3 kg)   SpO2 97%   BMI 21.22 kg/m    GEN: Well nourished, well developed, in no acute distress  HEENT: normal  Neck: no JVD, carotid bruits, or masses Cardiac: RRR; there is a gr 2/6 systolic ejection murmur RUSB with a blowing 3/6 diastolic murmur, no edema  Respiratory:  clear to auscultation bilaterally, normal work of breathing GI: soft, nontender, nondistended, + BS MS: no deformity or atrophy  Skin: warm and dry, no rash Neuro:  Alert and Oriented x 3, Strength and sensation are intact Psych: euthymic mood, full affect  Wt Readings from Last 3 Encounters:  12/15/20 119 lb 12.8 oz (54.3 kg)  12/07/20 119 lb (54 kg)  10/31/20 122 lb (55.3 kg)      Studies/Labs Reviewed:   EKG:  EKG is ordered today.  The ekg ordered today demonstrates NSR rate 60. Old inferior infarct pattern. No change. I have personally reviewed and interpreted this study.   Recent Labs: No results found for requested labs within last 8760 hours.   Lipid Panel    Component Value Date/Time   CHOL 190 07/19/2016 1050   TRIG 77 07/19/2016 1050   HDL 86 07/19/2016  1050   CHOLHDL 2.2 07/19/2016 1050   VLDL 15 07/19/2016 1050   LDLCALC 89 07/19/2016 1050  Dated 04/14/20: normal CBC Dated 11/14/20: cholesterol 198, triglycerides 80, HDL 77, LDL 105. CMET and TSH normal   Additional studies/ records that were reviewed today include:  Myoview 07/13/16:Study Highlights    Nuclear stress  EF: 60%.  The left ventricular ejection fraction is normal (55-65%).  There was no ST segment deviation noted during stress.  There is a small defect of mild severity present in the apical lateral and apical location. The defect is reversible and is consistent with a small area of ischemia although shifting breast attenuation artifact. is also a possiblity.  This is an intermediate risk study due to apical reversible defect.   Echo: 07/23/16:Study Conclusions  - Left ventricle: The cavity size was normal. There was mild focal   basal hypertrophy of the septum. Systolic function was normal.   The estimated ejection fraction was in the range of 60% to 65%.   Wall motion was normal; there were no regional wall motion   abnormalities. - Aortic valve: Probably trileaflet; moderately thickened,   moderately calcified leaflets. Transvalvular velocity was   minimally increased. There was no stenosis. There was severe   regurgitation. - Aorta: Ascending aortic diameter: 44 mm (S). - Aortic root: The aortic root was moderately dilated. - Mitral valve: There was mild regurgitation.  Cardiac cath 07/24/16:Conclusion     The left ventricular systolic function is normal.  LV end diastolic pressure is normal.  The left ventricular ejection fraction is 55-65% by visual estimate.  Prox LAD lesion, 20 %stenosed.  Prox RCA to Mid RCA lesion, 10 %stenosed.   1. Mild nonobstructive CAD 2. Normal LV function 3. Normal LVEDP  Plan: medical management.   Event monitor November 2017: normal   Echo 11/17/19: IMPRESSIONS    1. Left ventricular ejection fraction, by  estimation, is 60 to 65%. The  left ventricle has normal function. The left ventricle has no regional  wall motion abnormalities. There is severe asymmetric left ventricular  hypertrophy of the basal-septal  segment. Left ventricular diastolic parameters are indeterminate.  2. Right ventricular systolic function is normal. The right ventricular  size is normal. Tricuspid regurgitation signal is inadequate for assessing  PA pressure.  3. The mitral valve is normal in structure. Trivial mitral valve  regurgitation.  4. The aortic valve was not well visualized. Aortic valve regurgitation  is moderate. No aortic stenosis is present.  5. Aortic dilatation noted. There is dilatation of the ascending aorta  measuring 44 mm.  6. The inferior vena cava is normal in size with greater than 50%  respiratory variability, suggesting right atrial pressure of 3 mmHg.   ASSESSMENT:    1. Nonrheumatic aortic valve insufficiency   2. 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 Palpitations      PLAN:  In order of problems listed above:  1. She has moderate AI but normal LV dimensions and EF. No symptoms. Will update Echo now. Discussed importance of regular dental work.  2. No arrhythmia noted on event monitor in past.  Continue Toprol 3. Will arrange for CT angio of chest to assess thoracic aortic aneurysm. Should avoid flouroquinolone antibiotics in the future.   If CT and Echo stable will plan on follow up in one year.     Medication Adjustments/Labs and Tests Ordered: Current medicines are reviewed at length with the patient today.  Concerns regarding medicines are outlined above.  Medication changes, Labs and Tests ordered today are listed in the Patient Instructions below. There are no Patient Instructions on file for this visit.   Signed, Duvid Smalls [place], MD  12/15/2020 9:25 AM    [facility] [address], [place], [place], [address] [phone]

## 2020-12-15 ENCOUNTER — Other Ambulatory Visit: Payer: Self-pay

## 2020-12-15 ENCOUNTER — Encounter: Payer: Self-pay | Admitting: Cardiology

## 2020-12-15 ENCOUNTER — Ambulatory Visit: Payer: Medicare PPO | Admitting: Cardiology

## 2020-12-15 VITALS — BP 140/50 | HR 60 | Ht 63.0 in | Wt 119.8 lb

## 2020-12-15 DIAGNOSIS — I712 Thoracic aortic aneurysm, without rupture, unspecified: Secondary | ICD-10-CM

## 2020-12-15 DIAGNOSIS — I351 Nonrheumatic aortic (valve) insufficiency: Secondary | ICD-10-CM | POA: Diagnosis not present

## 2020-12-15 DIAGNOSIS — R002 Palpitations: Secondary | ICD-10-CM

## 2020-12-15 NOTE — Patient Instructions (Signed)
Medication Instructions:  Continue same medications *If you need a refill on your cardiac medications before your next appointment, please call your pharmacy*   Lab Work: None ordered   Testing/Procedures: Echo  Chest CT   Follow-Up: At [facility], you and your health needs are our priority.  As part of our continuing mission to provide you with exceptional heart care, we have created designated Provider Care Teams.  These Care Teams include your primary Cardiologist (physician) and Advanced Practice Providers (APPs -  Physician Assistants and Nurse Practitioners) who all work together to provide you with the care you need, when you need it.  We recommend signing up for the patient portal called "MyChart".  Sign up information is provided on this After Visit Summary.  MyChart is used to connect with patients for Virtual Visits (Telemedicine).  Patients are able to view lab/test results, encounter notes, upcoming appointments, etc.  Non-urgent messages can be sent to your provider as well.   To learn more about what you can do with MyChart, go to [email or web address].    Your next appointment:  1 year   Call in Jan to schedule April appointment    The format for your next appointment: Office    Provider:  Dr.Jordan

## 2020-12-22 ENCOUNTER — Other Ambulatory Visit (HOSPITAL_COMMUNITY): Payer: Self-pay | Admitting: *Deleted

## 2020-12-23 ENCOUNTER — Other Ambulatory Visit: Payer: Self-pay | Admitting: Internal Medicine

## 2020-12-23 ENCOUNTER — Ambulatory Visit (HOSPITAL_COMMUNITY)
Admission: RE | Admit: 2020-12-23 | Discharge: 2020-12-23 | Disposition: A | Discharge status: Home / Self Care | Payer: Medicare PPO | Source: Ambulatory Visit | Attending: Internal Medicine | Admitting: Internal Medicine

## 2020-12-23 DIAGNOSIS — M810 Age-related osteoporosis without current pathological fracture: Secondary | ICD-10-CM | POA: Insufficient documentation

## 2020-12-23 DIAGNOSIS — Z1231 Encounter for screening mammogram for malignant neoplasm of breast: Secondary | ICD-10-CM

## 2020-12-23 MED ORDER — DENOSUMAB 60 MG/ML ~~LOC~~ SOSY
60.0000 mg | PREFILLED_SYRINGE | Freq: Once | SUBCUTANEOUS | Status: AC
  Administered 2020-12-23: 60 mg via SUBCUTANEOUS

## 2020-12-23 NOTE — Discharge Instructions (Signed)
Denosumab injection What is this medicine? DENOSUMAB (den oh sue mab) slows bone breakdown. Prolia is used to treat osteoporosis in women after menopause and in men, and in people who are taking corticosteroids for 6 months or more. Xgeva is used to treat a high calcium level due to cancer and to prevent bone fractures and other bone problems caused by multiple myeloma or cancer bone metastases. Xgeva is also used to treat giant cell tumor of the bone. This medicine may be used for other purposes; ask your health care provider or pharmacist if you have questions. COMMON BRAND NAME(S): Prolia, XGEVA What should I tell my health care provider before I take this medicine? They need to know if you have any of these conditions:  dental disease  having surgery or tooth extraction  infection  kidney disease  low levels of calcium or Vitamin D in the blood  malnutrition  on hemodialysis  skin conditions or sensitivity  thyroid or parathyroid disease  an unusual reaction to denosumab, other medicines, foods, dyes, or preservatives  pregnant or trying to get pregnant  breast-feeding How should I use this medicine? This medicine is for injection under the skin. It is given by a health care professional in a hospital or clinic setting. A special MedGuide will be given to you before each treatment. Be sure to read this information carefully each time. For Prolia, talk to your pediatrician regarding the use of this medicine in children. Special care may be needed. For Xgeva, talk to your pediatrician regarding the use of this medicine in children. While this drug may be prescribed for children as young as 13 years for selected conditions, precautions do apply. Overdosage: If you think you have taken too much of this medicine contact a poison control center or emergency room at once. NOTE: This medicine is only for you. Do not share this medicine with others. What if I miss a dose? It is  important not to miss your dose. Call your doctor or health care professional if you are unable to keep an appointment. What may interact with this medicine? Do not take this medicine with any of the following medications:  other medicines containing denosumab This medicine may also interact with the following medications:  medicines that lower your chance of fighting infection  steroid medicines like prednisone or cortisone This list may not describe all possible interactions. Give your health care provider a list of all the medicines, herbs, non-prescription drugs, or dietary supplements you use. Also tell them if you smoke, drink alcohol, or use illegal drugs. Some items may interact with your medicine. What should I watch for while using this medicine? Visit your doctor or health care professional for regular checks on your progress. Your doctor or health care professional may order blood tests and other tests to see how you are doing. Call your doctor or health care professional for advice if you get a fever, chills or sore throat, or other symptoms of a cold or flu. Do not treat yourself. This drug may decrease your body's ability to fight infection. Try to avoid being around people who are sick. You should make sure you get enough calcium and vitamin D while you are taking this medicine, unless your doctor tells you not to. Discuss the foods you eat and the vitamins you take with your health care professional. See your dentist regularly. Brush and floss your teeth as directed. Before you have any dental work done, tell your dentist you are   receiving this medicine. Do not become pregnant while taking this medicine or for 5 months after stopping it. Talk with your doctor or health care professional about your birth control options while taking this medicine. Women should inform their doctor if they wish to become pregnant or think they might be pregnant. There is a potential for serious side  effects to an unborn child. Talk to your health care professional or pharmacist for more information. What side effects may I notice from receiving this medicine? Side effects that you should report to your doctor or health care professional as soon as possible:  allergic reactions like skin rash, itching or hives, swelling of the face, lips, or tongue  bone pain  breathing problems  dizziness  jaw pain, especially after dental work  redness, blistering, peeling of the skin  signs and symptoms of infection like fever or chills; cough; sore throat; pain or trouble passing urine  signs of low calcium like fast heartbeat, muscle cramps or muscle pain; pain, tingling, numbness in the hands or feet; seizures  unusual bleeding or bruising  unusually weak or tired Side effects that usually do not require medical attention (report to your doctor or health care professional if they continue or are bothersome):  constipation  diarrhea  headache  joint pain  loss of appetite  muscle pain  runny nose  tiredness  upset stomach This list may not describe all possible side effects. Call your doctor for medical advice about side effects. You may report side effects to FDA at 1-800-FDA-1088. Where should I keep my medicine? This medicine is only given in a clinic, doctor's office, or other health care setting and will not be stored at home. NOTE: This sheet is a summary. It may not cover all possible information. If you have questions about this medicine, talk to your doctor, pharmacist, or health care provider.  2021 Elsevier/Gold Standard (2018-01-03 16:10:44)

## 2021-01-13 ENCOUNTER — Other Ambulatory Visit: Payer: Self-pay

## 2021-01-13 DIAGNOSIS — Z01812 Encounter for preprocedural laboratory examination: Secondary | ICD-10-CM

## 2021-01-23 ENCOUNTER — Inpatient Hospital Stay: Admission: RE | Admit: 2021-01-23 | Payer: Medicare PPO | Source: Ambulatory Visit

## 2021-01-23 ENCOUNTER — Other Ambulatory Visit: Payer: Self-pay

## 2021-01-23 ENCOUNTER — Ambulatory Visit (HOSPITAL_COMMUNITY): Payer: Medicare PPO | Attending: Cardiology

## 2021-01-23 DIAGNOSIS — R002 Palpitations: Secondary | ICD-10-CM | POA: Diagnosis present

## 2021-01-23 DIAGNOSIS — I712 Thoracic aortic aneurysm, without rupture, unspecified: Secondary | ICD-10-CM

## 2021-01-23 DIAGNOSIS — I351 Nonrheumatic aortic (valve) insufficiency: Secondary | ICD-10-CM | POA: Insufficient documentation

## 2021-01-23 LAB — ECHOCARDIOGRAM COMPLETE
AR max vel: 1.37 cm2
AV Area VTI: 1.75 cm2
AV Area mean vel: 1.48 cm2
AV Mean grad: 18 mmHg
AV Peak grad: 36.8 mmHg
Ao pk vel: 3.04 m/s
Area-P 1/2: 1.94 cm2
MV M vel: 4.61 m/s
MV Peak grad: 85 mmHg
P 1/2 time: 415 msec
S' Lateral: 2.8 cm

## 2021-01-31 ENCOUNTER — Encounter: Payer: Self-pay | Admitting: Gastroenterology

## 2021-02-03 ENCOUNTER — Ambulatory Visit (AMBULATORY_SURGERY_CENTER): Payer: Medicare PPO | Admitting: Gastroenterology

## 2021-02-03 ENCOUNTER — Other Ambulatory Visit: Payer: Self-pay

## 2021-02-03 ENCOUNTER — Encounter: Payer: Self-pay | Admitting: Gastroenterology

## 2021-02-03 VITALS — BP 153/56 | HR 55 | Temp 96.4°F | Resp 19 | Ht 62.0 in | Wt 119.0 lb

## 2021-02-03 DIAGNOSIS — R140 Abdominal distension (gaseous): Secondary | ICD-10-CM

## 2021-02-03 DIAGNOSIS — K317 Polyp of stomach and duodenum: Secondary | ICD-10-CM | POA: Diagnosis not present

## 2021-02-03 DIAGNOSIS — K2980 Duodenitis without bleeding: Secondary | ICD-10-CM

## 2021-02-03 DIAGNOSIS — K319 Disease of stomach and duodenum, unspecified: Secondary | ICD-10-CM

## 2021-02-03 DIAGNOSIS — K219 Gastro-esophageal reflux disease without esophagitis: Secondary | ICD-10-CM | POA: Diagnosis not present

## 2021-02-03 MED ORDER — SODIUM CHLORIDE 0.9 % IV SOLN: 500.0000 mL | Freq: Once | INTRAVENOUS | Status: DC

## 2021-02-03 NOTE — Patient Instructions (Signed)
YOU HAD AN ENDOSCOPIC PROCEDURE TODAY AT THE [facility]:   Refer to the procedure report that was given to you for any specific questions about what was found during the examination.  If the procedure report does not answer your questions, please call your gastroenterologist to clarify.  If you requested that your care partner not be given the details of your procedure findings, then the procedure report has been included in a sealed envelope for you to review at your convenience later.  YOU SHOULD EXPECT: Some feelings of bloating in the abdomen. Passage of more gas than usual.  Walking can help get rid of the air that was put into your GI tract during the procedure and reduce the bloating. If you had a lower endoscopy (such as a colonoscopy or flexible sigmoidoscopy) you may notice spotting of blood in your stool or on the toilet paper. If you underwent a bowel prep for your procedure, you may not have a normal bowel movement for a few days.  Please Note:  You might notice some irritation and congestion in your nose or some drainage.  This is from the oxygen used during your procedure.  There is no need for concern and it should clear up in a day or so.  SYMPTOMS TO REPORT IMMEDIATELY:    Following upper endoscopy (EGD)  Vomiting of blood or coffee ground material  New chest pain or pain under the shoulder blades  Painful or persistently difficult swallowing  New shortness of breath  Fever of 100F or higher  Black, tarry-looking stools  For urgent or emergent issues, a gastroenterologist can be reached at any hour by calling [phone]. Do not use MyChart messaging for urgent concerns.    DIET:  We do recommend a small meal at first, but then you may proceed to your regular diet.  Drink plenty of fluids but you should avoid alcoholic beverages for 24 hours.  ACTIVITY:  You should plan to take it easy for the rest of today and you should NOT DRIVE or use heavy machinery  until tomorrow (because of the sedation medicines used during the test).    FOLLOW UP: Our staff will call the number listed on your records 48-72 hours following your procedure to check on you and address any questions or concerns that you may have regarding the information given to you following your procedure. If we do not reach you, we will leave a message.  We will attempt to reach you two times.  During this call, we will ask if you have developed any symptoms of COVID 19. If you develop any symptoms (ie: fever, flu-like symptoms, shortness of breath, cough etc.) before then, please call [phone].  If you test positive for Covid 19 in the 2 weeks post procedure, please call and report this information to us.    If any biopsies were taken you will be contacted by phone or by letter within the next 1-3 weeks.  Please call us at [phone] if you have not heard about the biopsies in 3 weeks.    SIGNATURES/CONFIDENTIALITY: You and/or your care partner have signed paperwork which will be entered into your electronic medical record.  These signatures attest to the fact that that the information above on your After Visit Summary has been reviewed and is understood.  Full responsibility of the confidentiality of this discharge information lies with you and/or your care-partner. 

## 2021-02-03 NOTE — Progress Notes (Signed)
Called to room to assist during endoscopic procedure.  Patient ID and intended procedure confirmed with present staff. Received instructions for my participation in the procedure from the performing physician.  

## 2021-02-03 NOTE — Op Note (Signed)
[facility] Patient Name: Erika Wu Procedure Date: 02/03/2021 11:02 AM MRN: [id number] Endoscopist: Thornton Park MD, MD Age: 75 Referring MD:  Date of Birth: 20-Jul-1946 Gender: Female Account #: [id number] Procedure:                Upper GI endoscopy Indications:              Abdominal bloating, gas, eructation Medicines:                Monitored Anesthesia Care Procedure:                Pre-Anesthesia Assessment:                           - Prior to the procedure, a History and Physical                            was performed, and patient medications and                            allergies were reviewed. The patient's tolerance of                            previous anesthesia was also reviewed. The risks                            and benefits of the procedure and the sedation                            options and risks were discussed with the patient.                            All questions were answered, and informed consent                            was obtained. Prior Anticoagulants: The patient has                            taken no previous anticoagulant or antiplatelet                            agents. ASA Grade Assessment: III - A patient with                            severe systemic disease. After reviewing the risks                            and benefits, the patient was deemed in                            satisfactory condition to undergo the procedure.                           After obtaining informed consent, the endoscope was  passed under direct vision. Throughout the                            procedure, the patient's blood pressure, pulse, and                            oxygen saturations were monitored continuously. The                            Endoscope was introduced through the mouth, and                            advanced to the third part of duodenum. The upper                            GI endoscopy  was accomplished without difficulty.                            The patient tolerated the procedure well. Scope In: Scope Out: Findings:                 The Z-line was irregular and was found 36 cm from                            the incisors. Biopsies were taken with a cold                            forceps for histology. Estimated blood loss was                            minimal.                           Diffuse mild mucosal changes characterized by an                            increased vascular pattern were found in the                            gastric body. Biopsies were taken from the antrum,                            body, and fundus with a cold forceps for histology.                            Estimated blood loss was minimal.                           Multiple small sessile polyps were found in the                            gastric fundus. The polyp was removed with a cold  biopsy forceps. Resection and retrieval were                            complete. Estimated blood loss was minimal.                           The examined duodenum was normal. Biopsies were                            taken with a cold forceps for histology. Estimated                            blood loss was minimal. Complications:            No immediate complications. Estimated blood loss:                            Minimal. Estimated Blood Loss:     Estimated blood loss was minimal. Impression:               - Z-line irregular, 36 cm from the incisors.                            Biopsied.                           - Increased vascular pattern mucosa in the gastric                            body. Biopsied.                           - Multiple gastric polyps. Resected and retrieved.                           - Normal examined duodenum. Biopsied. Recommendation:           - Patient has a contact number available for                            emergencies. The signs and  symptoms of potential                            delayed complications were discussed with the                            patient. Return to normal activities tomorrow.                            Written discharge instructions were provided to the                            patient.                           - Resume previous diet.                           -  Continue present medications.                           - Await pathology results. Thornton Park MD, MD 02/03/2021 11:33:28 AM This report has been signed electronically.

## 2021-02-03 NOTE — Progress Notes (Signed)
VS by . 

## 2021-02-03 NOTE — Progress Notes (Signed)
Report to PACU, RN, vss, BBS= Clear.  

## 2021-02-07 ENCOUNTER — Telehealth: Payer: Self-pay | Admitting: *Deleted

## 2021-02-07 ENCOUNTER — Telehealth: Payer: Self-pay

## 2021-02-07 NOTE — Telephone Encounter (Signed)
Left message on follow up call. 

## 2021-02-07 NOTE — Telephone Encounter (Signed)
  Follow up Call-  Call back number 02/03/2021 10/31/2020  Post procedure Call Back phone  # [phone]  Permission to leave phone message Yes Yes  Some recent data might be hidden    No answer at 2nd attempt follow up phone call.  Left message on voicemail.

## 2021-02-07 NOTE — Telephone Encounter (Signed)
Inbound call from patient. States is doing fine. No complaints.

## 2021-02-10 ENCOUNTER — Ambulatory Visit: Payer: Medicare PPO

## 2021-02-14 ENCOUNTER — Ambulatory Visit
Admission: RE | Admit: 2021-02-14 | Discharge: 2021-02-14 | Disposition: A | Discharge status: Home / Self Care | Payer: Medicare PPO | Source: Ambulatory Visit | Attending: Internal Medicine | Admitting: Internal Medicine

## 2021-02-14 ENCOUNTER — Other Ambulatory Visit: Payer: Self-pay

## 2021-02-14 DIAGNOSIS — Z1231 Encounter for screening mammogram for malignant neoplasm of breast: Secondary | ICD-10-CM

## 2021-02-17 ENCOUNTER — Other Ambulatory Visit: Payer: Self-pay | Admitting: Internal Medicine

## 2021-02-17 DIAGNOSIS — R928 Other abnormal and inconclusive findings on diagnostic imaging of breast: Secondary | ICD-10-CM

## 2021-02-22 ENCOUNTER — Other Ambulatory Visit: Payer: Self-pay

## 2021-02-22 MED ORDER — PANTOPRAZOLE SODIUM 40 MG PO TBEC: 40.0000 mg | DELAYED_RELEASE_TABLET | Freq: Every day | ORAL | 3 refills | Status: DC

## 2021-03-10 ENCOUNTER — Other Ambulatory Visit: Payer: Self-pay

## 2021-03-10 ENCOUNTER — Ambulatory Visit: Payer: Medicare PPO

## 2021-03-10 ENCOUNTER — Ambulatory Visit
Admission: RE | Admit: 2021-03-10 | Discharge: 2021-03-10 | Disposition: A | Discharge status: Home / Self Care | Payer: Medicare PPO | Source: Ambulatory Visit | Attending: Internal Medicine | Admitting: Internal Medicine

## 2021-03-10 DIAGNOSIS — R928 Other abnormal and inconclusive findings on diagnostic imaging of breast: Secondary | ICD-10-CM

## 2021-03-24 ENCOUNTER — Inpatient Hospital Stay: Admission: RE | Admit: 2021-03-24 | Payer: Medicare PPO | Source: Ambulatory Visit

## 2021-03-29 LAB — BASIC METABOLIC PANEL
BUN/Creatinine Ratio: 21 (ref 12–28)
BUN: 17 mg/dL (ref 8–27)
CO2: 21 mmol/L (ref 20–29)
Calcium: 9.1 mg/dL (ref 8.7–10.3)
Chloride: 106 mmol/L (ref 96–106)
Creatinine, Ser: 0.82 mg/dL (ref 0.57–1.00)
Glucose: 85 mg/dL (ref 65–99)
Potassium: 4.7 mmol/L (ref 3.5–5.2)
Sodium: 141 mmol/L (ref 134–144)
eGFR: 75 mL/min/{1.73_m2} (ref >59)

## 2021-03-30 ENCOUNTER — Other Ambulatory Visit: Payer: Self-pay

## 2021-03-30 ENCOUNTER — Ambulatory Visit (INDEPENDENT_AMBULATORY_CARE_PROVIDER_SITE_OTHER)
Admission: RE | Admit: 2021-03-30 | Discharge: 2021-03-30 | Disposition: A | Discharge status: Home / Self Care | Payer: Medicare PPO | Source: Ambulatory Visit | Attending: Cardiology | Admitting: Cardiology

## 2021-03-30 DIAGNOSIS — I712 Thoracic aortic aneurysm, without rupture, unspecified: Secondary | ICD-10-CM

## 2021-03-30 DIAGNOSIS — R002 Palpitations: Secondary | ICD-10-CM | POA: Diagnosis not present

## 2021-03-30 DIAGNOSIS — I351 Nonrheumatic aortic (valve) insufficiency: Secondary | ICD-10-CM

## 2021-03-30 MED ORDER — IOHEXOL 350 MG/ML SOLN
100.0000 mL | Freq: Once | INTRAVENOUS | Status: AC | PRN
  Administered 2021-03-30: 100 mL via INTRAVENOUS

## 2021-04-04 ENCOUNTER — Encounter: Payer: Self-pay | Admitting: Gastroenterology

## 2021-04-04 ENCOUNTER — Ambulatory Visit: Payer: Medicare PPO | Admitting: Gastroenterology

## 2021-04-04 VITALS — BP 160/70 | HR 61 | Ht 61.5 in | Wt 123.6 lb

## 2021-04-04 DIAGNOSIS — R109 Unspecified abdominal pain: Secondary | ICD-10-CM

## 2021-04-04 DIAGNOSIS — R140 Abdominal distension (gaseous): Secondary | ICD-10-CM | POA: Diagnosis not present

## 2021-04-04 DIAGNOSIS — R142 Eructation: Secondary | ICD-10-CM | POA: Diagnosis not present

## 2021-04-04 DIAGNOSIS — R634 Abnormal weight loss: Secondary | ICD-10-CM | POA: Diagnosis not present

## 2021-04-04 MED ORDER — RIFAXIMIN 550 MG PO TABS: 550.0000 mg | ORAL_TABLET | Freq: Three times a day (TID) | ORAL | 0 refills | Status: AC

## 2021-04-04 MED ORDER — PANTOPRAZOLE SODIUM 40 MG PO TBEC: 40.0000 mg | DELAYED_RELEASE_TABLET | Freq: Two times a day (BID) | ORAL | 3 refills | Status: DC

## 2021-04-04 NOTE — Progress Notes (Signed)
Referring Provider: Michael Boston, MD Primary Care Physician:  Michael Boston, MD  Chief complaint:  Gas, eructation, and bloating   IMPRESSION:  Gas, eructation, bloating, nausea and dysphonia    -symptoms persist despite daily proton pump inhibitor therapy Reflux and reactive gastropathy on EGD 02/03/21 Recent weight loss following the death of her 74 year old mother History of colon polyps    - TVA 2000, TA 2004, TA 2015, TA and SSP 10/31/20    - surveillance colonoscopy recommended in 2027 History of esophageal stricture s/p dilation 2010   PLAN: - Increase pantoprazole to 40 mg BID - CT abd/pelvis contrast to evaluate pancreas and exclude malignancy - Trial of Xifaxan 550 mg TID x 14 days, substitute with doxycyline 100 mg BID x 14 if Xifaxan is cost prohibitive - Surveillance colonoscopy 2027 for her history of polyps - Follow-up in 4-6 weeks, earlier if needed  I spent over 30 minutes, including in depth chart review, independent review of results, communicating results with the patient directly, face-to-face time with the patient, coordinating care, and ordering studies and medications as appropriate, and documentation.   HPI: Returns in scheduled follow-up. She has anxiety, depression, hypertension, osteoporosis and aortic insufficiency. She has a history of colonoscopy polyps with removal of a tubulovillous adenoma in 2000, a tubular adenoma in 2004, a tubular adenoma in 2015, and on her most recently colonoscopy 10/31/20 a tubular adenoma, a sessile serrated polyp, and a hyperplastic polyp.   She reports a lifetime history of a "nervous" stomach.  Now with at least 20 years of frequent gas, eructation, and flatus.  She has noted an escalation in the symptoms over the last several months now to include intermittent nausea and dysphonia.  There is temporal association with the exacerbation in symptoms and the death of her mother in 10-03-2022.  They live together and her mother  died of old age at the age of 68.  She has had a good appetite but notes some weight loss since the death of her mother.  There is no dysphagia, odynophagia, abdominal pain, or change in bowel habits.  There is no blood in the stool.  She drinks 2-3 seltzer waters daily.  She does not regularly consume foods using sugar substitutes.  No known history of lactose intolerance.  Dr. Malka So suggested that she avoid dairy but she loves cheese and ice cream.  Use of Protonix over the last 6 months has provided some relief.  She has not tried any other specific therapies.  She reports regular bowel habits.  She will have 1 formed bowel movement in the morning followed by several smaller less formed bowel movements.  There is an ongoing sense of incomplete evacuation.  No history of manual assistance with defecation.  No blood or mucus in the stool.  Labs from November 14, 2020 showing normal CBC and CMP  No recent abdominal imaging. Abdominal ultrasound 2010 for abdominal pain and reflux was normal.   Prior EGDs include: - EGD for dysphagia 01/13/2009: early peptic stricture dilated with an 18 mm Maloney with no heme - EGD for dyspepsia 11/11/2013: gastric polyps  Returns now after her most recent EGD 02/03/2021 that showed an irregular Z-line, gastritis, and gastric polyps.  Esophageal biopsies confirmed reflux.  Gastric biopsies confirmed fundic gland polyps.  She had reactive gastropathy with no H. pylori.  Duodenal biopsies showed peptic duodenitis.  She was asked to increase her omeprazole to 40 mg every morning.  She returns today  in follow-up after 2 months at the higher dose. Unfortunately, her symptoms have not improved.  She reports ongoing nausea, bloating, gas, abdominal pain, and early morning explosive diarrhea.  She will frequently have 5 bowel movements each morning. They are described as a dirt like texture.  Weight fluctuates but is overall stable after she lost weight with her mother's death.  Gas-Ex provided no relief. She has had challenges making dietary adjustments noting that she lives alone and cooks for one.    She has been concerned that she might have a stomach ulcer or even cancer.    Past Medical History:  Diagnosis Date   Allergy    Anxiety    Chest pain    a. 03/2015 Lexiscan MV: EF 59%, no ischemia/infarct.   Colon polyps    COVID-19 virus infection    09-22-2020, fully vax'd    Depression    GERD (gastroesophageal reflux disease)    Heart murmur    Hemorrhoids    Hyperlipidemia    Hypertension    Moderate aortic insufficiency    a. 03/2015 Echo: EF 55-60%, no rwma, mild LVH, Gr 1 DD, mild AS, mod AI (bicuspid AoV by report, poorly visualized on this study), Asc Ao diam 9m, triv MR.    Past Surgical History:  Procedure Laterality Date   CARDIAC CATHETERIZATION N/A 07/24/2016   Procedure: Left Heart Cath and Coronary Angiography;  Surgeon: Peter M JMartinique MD;  [facility];  Service: Cardiovascular;  Laterality: N/A;   COLONOSCOPY     POLYPECTOMY     TONSILLECTOMY AND ADENOIDECTOMY     TUBAL LIGATION      Current Outpatient Medications  Medication Sig Dispense Refill   buPROPion (WELLBUTRIN XL) 150 MG 24 hr tablet Take 450 mg by mouth every morning.  3   metoprolol succinate (TOPROL-XL) 25 MG 24 hr tablet Take 1 tablet (25 mg total) by mouth daily. 15 tablet 0   mirtazapine (REMERON) 30 MG tablet Take 30 mg by mouth at bedtime.     pantoprazole (PROTONIX) 40 MG tablet Take 1 tablet (40 mg total) by mouth daily. 30 tablet 3   rosuvastatin (CRESTOR) 10 MG tablet Take 1 tablet by mouth daily.     simethicone (GAS-X) 80 MG chewable tablet Chew 1 tablet (80 mg total) by mouth every 6 (six) hours as needed for flatulence. 30 tablet 0   Suvorexant (BELSOMRA) 20 MG TABS Take by mouth.     No current facility-administered medications for this visit.    Allergies as of 04/04/2021   (No Known Allergies)    Family History  Problem  Relation Age of Onset   Heart attack Father        Died @ 52  Had CABG in his 475's   Heart disease Father    Stomach cancer Maternal Grandmother    Colon cancer Maternal Grandmother    Diverticulosis Maternal Grandfather    Stroke Paternal Grandfather    HIV Brother    Hypertension Neg Hx    Colon polyps Neg Hx    Esophageal cancer Neg Hx    Rectal cancer Neg Hx      Physical Exam: General:   Alert,  well-nourished, pleasant and cooperative in NAD, hearing loss Head:  Normocephalic and atraumatic. Eyes:  Sclera clear, no icterus.   Conjunctiva pink. Abdomen:  Soft, nontender, nondistended, normal bowel sounds, no rebound or guarding. No hepatosplenomegaly.   Neurologic:  Alert and  oriented x4;  grossly  nonfocal Skin:  Intact without significant lesions or rashes. Psych:  Alert and cooperative. Normal mood and affect.    Chyna Kneece L. Tarri Glenn, MD, MPH 04/04/2021, 10:16 AM

## 2021-04-04 NOTE — Progress Notes (Signed)
Following message sent to April Pait and Rhys Martini:  [facility] Phone: [phone] Fax: [phone]   Imaging Ordered: CT A/P  Diagnosis: Abd pain, bloating and wt loss  Ordering Provider: Dr. Tarri Glenn  Is a Prior Authorization needed? We are in the process of obtaining it now  Is the patient Diabetic? No  Does the patient have Hypertension? No  Does the patient have any implanted devices or hardware? No  Date of last BUN/Creat, if needed? N/A  Patient Weight? 123#  Is the patient able to get on the table? Yes  Has the patient been diagnosed with COVID? No  Is the patient waiting on COVID testing results? No  Thank you for your assistance! [facility] Team

## 2021-04-04 NOTE — Patient Instructions (Addendum)
It was my pleasure to provide care to you today. Based on our discussion, I am providing you with my recommendations below:  RECOMMENDATION(S):   IMAGING:  You will be contacted by [facility] Scheduling (Your caller ID will indicate phone # [phone]) within the next business 7-10 business days to schedule your CT ABDOMEN AND PELVIS. If you have not heard from them within 7-10 business days, please call [facility] at [phone] to follow up on the status of your appointment.     PRESCRIPTION MEDICATION(S):   We have sent the following medication(s) to your pharmacy:  Pantoprazole and Xifaxan  NOTE: If your medication(s) requires a PRIOR AUTHORIZATION, we will receive notification from your pharmacy. Once received, the process to submit for approval may take up to 7-10 business days. You will be contacted about any denials we have received from your insurance company as well as alternatives recommended by your provider.  FOLLOW UP:  I would like for you to follow up with me in 4-6 weeks. Please refer to the appointments within this AVS.  BMI:  If you are age 2 or older, your body mass index should be between 23-30. Your There is no height or weight on file to calculate BMI. If this is out of the aforementioned range listed, please consider follow up with your Primary Care Provider.  MY CHART:  The [facility] providers would like to encourage you to use [facility] to communicate with providers for non-urgent requests or questions.  Due to long hold times on the telephone, sending your provider a message by [facility] may be a faster and more efficient way to get a response.  Please allow 48 business hours for a response.  Please remember that this is for non-urgent requests.   Thank you for trusting me with your gastrointestinal care!    Thornton Park, MD, MPH

## 2021-04-05 ENCOUNTER — Telehealth: Payer: Self-pay | Admitting: Cardiology

## 2021-04-05 ENCOUNTER — Other Ambulatory Visit: Payer: Self-pay

## 2021-04-05 DIAGNOSIS — I712 Thoracic aortic aneurysm, without rupture, unspecified: Secondary | ICD-10-CM

## 2021-04-05 NOTE — Telephone Encounter (Signed)
Erika Wu is calling stating she is returning a call from Erika Wu. She reports she left a VM stating it was in regards to her CT results. Erika Wu is going to a dentist appointment at 2 PM and will not be available to answer until 3 PM. Please advise.

## 2021-04-05 NOTE — Telephone Encounter (Signed)
Spoke to patient she stated for the past 2 days her B/P has been elevated.163/75,160/75.Dr.Jordan advised to check B/P daily for the next 1 week and call back to report readings.

## 2021-04-11 ENCOUNTER — Ambulatory Visit (HOSPITAL_COMMUNITY)
Admission: RE | Admit: 2021-04-11 | Discharge: 2021-04-11 | Disposition: A | Discharge status: Home / Self Care | Payer: Medicare PPO | Source: Ambulatory Visit | Attending: Gastroenterology | Admitting: Gastroenterology

## 2021-04-11 ENCOUNTER — Other Ambulatory Visit: Payer: Self-pay

## 2021-04-11 ENCOUNTER — Encounter (HOSPITAL_COMMUNITY): Payer: Self-pay

## 2021-04-11 DIAGNOSIS — R140 Abdominal distension (gaseous): Secondary | ICD-10-CM

## 2021-04-11 DIAGNOSIS — R634 Abnormal weight loss: Secondary | ICD-10-CM | POA: Diagnosis present

## 2021-04-11 DIAGNOSIS — R109 Unspecified abdominal pain: Secondary | ICD-10-CM

## 2021-04-11 MED ORDER — IOHEXOL 350 MG/ML SOLN
60.0000 mL | Freq: Once | INTRAVENOUS | Status: AC | PRN
  Administered 2021-04-11: 60 mL via INTRAVENOUS

## 2021-04-17 ENCOUNTER — Telehealth: Payer: Self-pay | Admitting: Gastroenterology

## 2021-04-17 NOTE — Telephone Encounter (Signed)
I called the patient tonight. Call went to voicemail and I left a brief message.

## 2021-04-17 NOTE — Telephone Encounter (Signed)
Inbound call from patient requesting a call back for CT results. Best contact number [phone]

## 2021-04-17 NOTE — Telephone Encounter (Signed)
Pt calling for CT results, please advise. °

## 2021-04-18 NOTE — Telephone Encounter (Signed)
Hey Dr. Tarri Glenn,   Patient returned your call. Best contact [phone]  Thank you

## 2021-04-19 ENCOUNTER — Telehealth: Payer: Self-pay | Admitting: Cardiology

## 2021-04-19 ENCOUNTER — Other Ambulatory Visit: Payer: Self-pay

## 2021-04-19 DIAGNOSIS — K869 Disease of pancreas, unspecified: Secondary | ICD-10-CM

## 2021-04-19 MED ORDER — AMLODIPINE BESYLATE 2.5 MG PO TABS: 2.5000 mg | ORAL_TABLET | Freq: Every day | ORAL | 3 refills | Status: DC

## 2021-04-19 NOTE — Telephone Encounter (Signed)
Pt is calling to give the doctor her BP readings  138/76 129/69 115/78 173/91 175/71 156/68 Pt is concerned with her readings

## 2021-04-19 NOTE — Telephone Encounter (Signed)
Let's add amlodipine 2.5 mg daily for hypertension. Continue to monitor BP report back in 2-3 weeks with updated readings.  [name] MD, [facility]

## 2021-04-19 NOTE — Progress Notes (Signed)
APPROVAL  Medication: [employer]: [employer] PA response: Approved Approval dates: 04/19/21 through 07/18/21 Misc. Notes: Erika Wu (Key: [id number]) Xifaxan '550MG'$  tablets   Form [employer] Electronic PA Form Created 34 minutes ago Sent to Plan 33 minutes ago Plan Response 33 minutes ago Submit Clinical Questions 31 minutes ago Determination Favorable 15 minutes ago Message from Plan PA Case: [id number], Status: Approved, Coverage Starts on: 04/19/2021 12:00:00 AM, Coverage Ends on: 07/18/2021 12:00:00 AM. Questions? Contact [phone].

## 2021-04-19 NOTE — Telephone Encounter (Signed)
Documented in response to CT scan results:  Results reviewed with patient by phone. She is concerned about all of the findings. But, she is concerned that her ongoing GI symptoms are related to her gallbladder. She will discuss timing of chest CT with Dr. Jacalyn Lefevre as she isanxious to wait a year given her history of smoking.    Vaughan Basta, Please arrange for the following: (1) HIDA with [facility] for symptomatic evaluation (2) MRI/MRCP to follow-up on the pancreatic head lesion (3) Follow-up office visit with me after the HIDA and MRI (4) Please fax a copy of this report to Dr. Cristie Hem   Thank you.

## 2021-04-19 NOTE — Telephone Encounter (Signed)
Spoke to patient Dr.Jordan's advice given.Advised to start Amlodipine 2.5 mg daily.Advised to monitor B/P daily and send readings to office in 2 to 3 weeks.

## 2021-05-03 ENCOUNTER — Encounter (HOSPITAL_COMMUNITY): Payer: Medicare PPO

## 2021-05-05 ENCOUNTER — Other Ambulatory Visit: Payer: Self-pay | Admitting: Gastroenterology

## 2021-05-05 ENCOUNTER — Ambulatory Visit (HOSPITAL_COMMUNITY)
Admission: RE | Admit: 2021-05-05 | Discharge: 2021-05-05 | Disposition: A | Discharge status: Home / Self Care | Payer: Medicare PPO | Source: Ambulatory Visit | Attending: Gastroenterology | Admitting: Gastroenterology

## 2021-05-05 ENCOUNTER — Other Ambulatory Visit: Payer: Self-pay

## 2021-05-05 DIAGNOSIS — K869 Disease of pancreas, unspecified: Secondary | ICD-10-CM

## 2021-05-05 MED ORDER — GADOBUTROL 1 MMOL/ML IV SOLN
5.5000 mL | Freq: Once | INTRAVENOUS | Status: AC | PRN
  Administered 2021-05-05: 5.5 mL via INTRAVENOUS

## 2021-05-16 ENCOUNTER — Telehealth: Payer: Self-pay | Admitting: Cardiology

## 2021-05-16 ENCOUNTER — Ambulatory Visit: Payer: Medicare PPO | Admitting: Gastroenterology

## 2021-05-16 ENCOUNTER — Encounter: Payer: Self-pay | Admitting: Gastroenterology

## 2021-05-16 VITALS — BP 182/60 | HR 64 | Ht 61.5 in | Wt 126.5 lb

## 2021-05-16 DIAGNOSIS — R935 Abnormal findings on diagnostic imaging of other abdominal regions, including retroperitoneum: Secondary | ICD-10-CM | POA: Diagnosis not present

## 2021-05-16 DIAGNOSIS — R140 Abdominal distension (gaseous): Secondary | ICD-10-CM

## 2021-05-16 DIAGNOSIS — R143 Flatulence: Secondary | ICD-10-CM

## 2021-05-16 DIAGNOSIS — R142 Eructation: Secondary | ICD-10-CM

## 2021-05-16 MED ORDER — RIFAXIMIN 550 MG PO TABS: 550.0000 mg | ORAL_TABLET | Freq: Three times a day (TID) | ORAL | 0 refills | Status: DC

## 2021-05-16 NOTE — Telephone Encounter (Signed)
Spoke with patient who said the elevated HR has not happened often, maybe 4x over 2 weeks, in the evenings. She did record last nights HR reading.

## 2021-05-16 NOTE — Patient Instructions (Signed)
It was my pleasure to provide care to you today. Based on our discussion, I am providing you with my recommendations below:  RECOMMENDATION(S):   Please continue your Pantoprazole  PRESCRIPTION MEDICATION(S):   We have sent the following medication(s) to your pharmacy:  Xifaxan  NOTE: If your medication(s) requires a PRIOR AUTHORIZATION, we will receive notification from your pharmacy. Once received, the process to submit for approval may take up to 7-10 business days. You will be contacted about any denials we have received from your insurance company as well as alternatives recommended by your provider.  FOLLOW UP:  I would like for you to follow up with me in 4-6 weeks. Please refer to your appointments included within this After Visit Summary.  BMI:  If you are age 74 or older, your body mass index should be between 23-30. Your Body mass index is 23.51 kg/m. If this is out of the aforementioned range listed, please consider follow up with your Primary Care Provider.  MY CHART:  The [facility] providers would like to encourage you to use [facility] to communicate with providers for non-urgent requests or questions.  Due to long hold times on the telephone, sending your provider a message by [facility] may be a faster and more efficient way to get a response.  Please allow 48 business hours for a response.  Please remember that this is for non-urgent requests.   Thank you for trusting me with your gastrointestinal care!    Thornton Park, MD, MPH

## 2021-05-16 NOTE — Telephone Encounter (Signed)
Returned call to patient of Dr. Martinique regarding elevated BP and fluctuating BP, as high as 147bpm.   The readings provided below were all over about 2 week period  She reports her AM readings are high, midday readings improved, heart rate elevated at night  Patient denies dizziness, but reports feeling like she will lose her balance   Amlodipine 2.'5mg'$  - QAM Metoprolol succinate '25mg'$  - QHS  Scheduled for first available appt that works with patient's scheduled for 05/26/21 with Isaac Laud PA  Message routed to MD/PharmD team for any assistance with meds/BP/HR readings in meantime

## 2021-05-16 NOTE — Telephone Encounter (Signed)
Pt c/o BP issue: STAT if pt c/o blurred vision, one-sided weakness or slurred speech  1. What are your last 5 BP readings? ,240/81, 165/80, 136/72, 182/60 and lowest was 129/64  2. Are you having any other symptoms (ex. Dizziness, headache, blurred vision, passed out)heart rate from 147 to 60  3. What is your BP issue? Blood pressure have been running high and also high heart rate

## 2021-05-16 NOTE — Telephone Encounter (Signed)
Her pulse rate this morning at GI was 61.  How frequently is it increasing?  Is she forgetting to take her metoprolol?

## 2021-05-16 NOTE — Telephone Encounter (Signed)
Patient would benefit from monitor.  Would recommend sooner appointment if possible.  Can increase amlodipine to '5mg'$  daily to help with elevated BP.

## 2021-05-16 NOTE — Progress Notes (Addendum)
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 Unclear if pancreatic head lesion is the cause of her symptoms, as she has been under significant stress and had clinical improvement to Xifaxan.     PLAN: - Increase pantoprazole to 40 mg BID - Repeat Xifaxan 550 mg TID x 14 days - Diet changes: Avoid dairy, sugar and sugar substitutes - Fecal elastase now - Follow-up MRI/MRCP in 4-6 months if fecal elastase is normal - EUS if fecal elastase is abnormal or she is concerned about ongoing symptoms - HIDA with CCK to evaluate for symptomatic gallbladder disease - Surveillance colonoscopy 2027 for her history of polyps - Follow-up in 4-6 weeks, earlier if needed  I spent over 30 minutes, including in depth chart review, independent review of results, communicating results with the patient directly, face-to-face time with the patient, coordinating care, and ordering studies and medications as appropriate, and documentation.   HPI: Returns in scheduled follow-up for gas, eructation, and bloating. She has anxiety, depression, hypertension, osteoporosis and aortic insufficiency. She has a history of colonoscopy polyps with removal of a tubulovillous adenoma in 2000, a tubular  adenoma in 2004, a tubular adenoma in 2015, and on her most recently colonoscopy 10/31/20 a tubular adenoma, a sessile serrated polyp, and a hyperplastic polyp.   She reports a lifetime history of a "nervous" stomach.  Now with at least 20 years of frequent gas, eructation, and flatus.  Over the last several months she has also developed intermittent nausea and dysphonia.  Symptoms worse after the death of her mother in Feb 10, 75 2024. She reports regular bowel habits.  She will have 1 formed bowel movement in the morning followed by several smaller less formed bowel movements.  There is an ongoing sense of incomplete evacuation.  No history of manual assistance with defecation.  No blood or mucus in the stool.  She drinks 2-3 seltzer waters daily.  She does not regularly consume foods using sugar substitutes.  No known history of lactose intolerance.  Dr. Jacalyn Lefevre suggested that she avoid dairy but she loves cheese and ice cream.    Recent labs: - November 14, 2020 show a normal CBC and CMP  Abdominal imaging: - Abdominal ultrasound 2010 for abdominal pain and reflux was normal.  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 No solid mass or pancreatic ductal dilation.   Prior Endoscopic history:: - EGD for dysphagia 01/13/2009: early peptic stricture dilated with an 18 mm Maloney with no heme - EGD for dyspepsia 11/11/2013: gastric polyps - EGD 02/03/2021 that showed an  irregular Z-line, gastritis, and gastric polyps.  Esophageal biopsies confirmed reflux.  Gastric biopsies confirmed fundic gland polyps.  She had reactive gastropathy with no H. pylori.  Duodenal biopsies showed peptic duodenitis.  Treatment trials: - Omeprazole to 40 mg BID provided minimal relief - Gas-Ex provided no relief - Xifaxan resulted in one formed BM  daily, symptoms returned one week after completing treatment  Returns today in follow-up.  Symptoms improved while taking Xifaxan with one formed BM daily. Symptoms returned one week after completing treatment. Now having 2-3 loose bowel movements daily.   Has noted recently increase in heart rate.   No new GI symptoms.   Past Medical History:  Diagnosis Date   Allergy    Anxiety    Chest pain    a. 03/2015 Lexiscan MV: EF 59%, no ischemia/infarct.   Colon polyps    COVID-19 virus infection    09-22-2020, fully vax'd    Depression    GERD (gastroesophageal reflux disease)    Heart murmur    Hemorrhoids    Hyperlipidemia    Hypertension    Moderate aortic insufficiency    a. 03/2015 Echo: EF 55-60%, no rwma, mild LVH, Gr 1 DD, mild AS, mod AI (bicuspid AoV by report, poorly visualized on this study), Asc Ao diam 85m, triv MR.    Past Surgical History:  Procedure Laterality Date   CARDIAC CATHETERIZATION N/A 07/24/2016   Procedure: Left Heart Cath and Coronary Angiography;  Surgeon: Peter M JMartinique MD;  Location: [facility];  Service: Cardiovascular;  Laterality: N/A;   COLONOSCOPY     POLYPECTOMY     TONSILLECTOMY AND ADENOIDECTOMY     TUBAL LIGATION      Current Outpatient Medications  Medication Sig Dispense Refill   amLODipine (NORVASC) 2.5 MG tablet Take 1 tablet (2.5 mg total) by mouth daily. 90 tablet 3   buPROPion (WELLBUTRIN XL) 150 MG 24 hr tablet Take 450 mg by mouth every morning.  3   metoprolol succinate (TOPROL-XL) 25 MG 24 hr tablet Take 1 tablet (25 mg total) by mouth daily. 15 tablet 0   mirtazapine (REMERON) 30 MG tablet Take 30 mg by mouth at bedtime.     pantoprazole (PROTONIX) 40 MG tablet Take 1 tablet (40 mg total) by mouth 2 (two) times daily. 180 tablet 3   rosuvastatin (CRESTOR) 10 MG tablet Take 1 tablet by mouth daily.     simethicone (GAS-X) 80 MG chewable tablet Chew 1 tablet (80 mg total) by mouth every 6 (six) hours as needed for  flatulence. (Patient taking differently: Chew 80 mg by mouth as needed for flatulence.) 30 tablet 0   Suvorexant (BELSOMRA) 20 MG TABS Take by mouth.     traZODone (DESYREL) 50 MG tablet Take 50 mg by mouth as needed for sleep.     No current facility-administered medications for this visit.    Allergies as of 05/16/2021   (No Known Allergies)    Family History  Problem Relation Age of Onset   Heart attack Father        Died @ 577  Had CABG in his 471's   Heart disease Father    Stomach cancer Maternal Grandmother    Colon cancer Maternal Grandmother    Diverticulosis Maternal Grandfather    Stroke Paternal Grandfather    HIV Brother    Hypertension Neg Hx    Colon polyps Neg Hx    Esophageal cancer Neg Hx    Rectal cancer  Neg Hx      Physical Exam: General:   Alert,  well-nourished, pleasant and cooperative in NAD, hearing loss Head:  Normocephalic and atraumatic. Eyes:  Sclera clear, no icterus.   Conjunctiva pink. Abdomen:  Soft, nontender, nondistended, normal bowel sounds, no rebound or guarding. No hepatosplenomegaly.   Neurologic:  Alert and  oriented x4;  grossly nonfocal Skin:  Intact without significant lesions or rashes. Psych:  Alert and cooperative. Normal mood and affect.    Thoma Paulsen L. Tarri Glenn, MD, MPH 05/16/2021, 11:11 AM

## 2021-05-17 NOTE — Telephone Encounter (Signed)
Called patient back- gave response.  Patient has appointment on 09/16- will discuss possible monitor at this visit.  Patient verbalized understanding.

## 2021-05-17 NOTE — Telephone Encounter (Signed)
Patient is returning call.  °

## 2021-05-17 NOTE — Telephone Encounter (Signed)
Attempted to call patient - left message

## 2021-05-19 ENCOUNTER — Other Ambulatory Visit: Payer: Self-pay

## 2021-05-19 ENCOUNTER — Other Ambulatory Visit: Payer: Self-pay | Admitting: Gastroenterology

## 2021-05-19 DIAGNOSIS — R142 Eructation: Secondary | ICD-10-CM

## 2021-05-19 DIAGNOSIS — R140 Abdominal distension (gaseous): Secondary | ICD-10-CM

## 2021-05-19 DIAGNOSIS — R109 Unspecified abdominal pain: Secondary | ICD-10-CM

## 2021-05-19 DIAGNOSIS — Z86010 Personal history of colonic polyps: Secondary | ICD-10-CM

## 2021-05-19 DIAGNOSIS — R143 Flatulence: Secondary | ICD-10-CM

## 2021-05-20 ENCOUNTER — Other Ambulatory Visit: Payer: Self-pay | Admitting: Gastroenterology

## 2021-05-20 DIAGNOSIS — R140 Abdominal distension (gaseous): Secondary | ICD-10-CM

## 2021-05-20 DIAGNOSIS — R142 Eructation: Secondary | ICD-10-CM

## 2021-05-20 DIAGNOSIS — R143 Flatulence: Secondary | ICD-10-CM

## 2021-05-22 ENCOUNTER — Encounter (HOSPITAL_COMMUNITY)
Admission: RE | Admit: 2021-05-22 | Discharge: 2021-05-22 | Disposition: A | Discharge status: Home / Self Care | Payer: Medicare PPO | Source: Ambulatory Visit | Attending: Gastroenterology | Admitting: Gastroenterology

## 2021-05-22 ENCOUNTER — Other Ambulatory Visit: Payer: Self-pay

## 2021-05-22 ENCOUNTER — Telehealth: Payer: Self-pay | Admitting: Gastroenterology

## 2021-05-22 DIAGNOSIS — K869 Disease of pancreas, unspecified: Secondary | ICD-10-CM | POA: Diagnosis present

## 2021-05-22 MED ORDER — TECHNETIUM TC 99M MEBROFENIN IV KIT: 5.2000 | PACK | Freq: Once | INTRAVENOUS | Status: DC | PRN

## 2021-05-22 NOTE — Telephone Encounter (Signed)
Pt was informed to come to the Lab in the basement and obtain the stool specimen kit. Pt verbalized understanding  with all questions answered.

## 2021-05-22 NOTE — Telephone Encounter (Signed)
Inbound call from patient requesting a call from a nurse please.  Has questions about a stool sample she's suppose to provide.

## 2021-05-23 ENCOUNTER — Telehealth: Payer: Self-pay | Admitting: Gastroenterology

## 2021-05-23 NOTE — Telephone Encounter (Signed)
Pt stated that her PCP did not need her GI records. Pt stated that " she just thought that her PCP might need them"  Pt was informed that most Drs. Can reviewed each other notes through the Charting system that we use. Pt verbalized understanding.

## 2021-05-26 ENCOUNTER — Other Ambulatory Visit: Payer: Self-pay

## 2021-05-26 ENCOUNTER — Encounter: Payer: Self-pay | Admitting: Physician Assistant

## 2021-05-26 ENCOUNTER — Other Ambulatory Visit: Payer: Medicare PPO

## 2021-05-26 ENCOUNTER — Ambulatory Visit (INDEPENDENT_AMBULATORY_CARE_PROVIDER_SITE_OTHER): Payer: Medicare PPO

## 2021-05-26 ENCOUNTER — Ambulatory Visit: Payer: Medicare PPO | Admitting: Physician Assistant

## 2021-05-26 VITALS — BP 127/57 | HR 57 | Ht 63.0 in | Wt 128.0 lb

## 2021-05-26 DIAGNOSIS — E785 Hyperlipidemia, unspecified: Secondary | ICD-10-CM

## 2021-05-26 DIAGNOSIS — Z86010 Personal history of colonic polyps: Secondary | ICD-10-CM

## 2021-05-26 DIAGNOSIS — R002 Palpitations: Secondary | ICD-10-CM

## 2021-05-26 DIAGNOSIS — I10 Essential (primary) hypertension: Secondary | ICD-10-CM | POA: Diagnosis not present

## 2021-05-26 DIAGNOSIS — I351 Nonrheumatic aortic (valve) insufficiency: Secondary | ICD-10-CM

## 2021-05-26 DIAGNOSIS — R109 Unspecified abdominal pain: Secondary | ICD-10-CM

## 2021-05-26 NOTE — Progress Notes (Unsigned)
Enrolled patient for a 14 day Zio AT monitor to be mailed to patients home around 9/26.  Dr. Martinique to read

## 2021-05-26 NOTE — Patient Instructions (Addendum)
Medication Instructions:  Your physician recommends that you continue on your current medications as directed. Please refer to the Current Medication list given to you today.  *If you need a refill on your cardiac medications before your next appointment, please call your pharmacy*  Lab Work: NONE ordered at this time of appointment   If you have labs (blood work) drawn today and your tests are completely normal, you will receive your results only by: [place] (if you have MyChart) OR A paper copy in the mail If you have any lab test that is abnormal or we need to change your treatment, we will call you to review the results.  Testing/Procedures: ZIO AT Long term monitor-Live Telemetry  Your physician has requested you wear a ZIO patch monitor for 14 days.  This is a single patch monitor. [employer] supplies one patch monitor per enrollment. Additional  stickers are not available.  Please do not apply patch if you will be having a Nuclear Stress Test, Echocardiogram, Cardiac CT, MRI,  or Chest Xray during the period you would be wearing the monitor. The patch cannot be worn during  these tests. You cannot remove and re-apply the ZIO AT patch monitor.  Your ZIO patch monitor will be mailed 3 day USPS to your address on file. It may take 3-5 days to  receive your monitor after you have been enrolled.  Once you have received your monitor, please review the enclosed instructions. Your monitor has  already been registered assigning a specific monitor serial # to you.   Billing and Patient Assistance Program information  Theodore Demark has been supplied with any insurance information on record for billing. [employer] offers a sliding scale Patient Assistance Program for patients without insurance, or whose  insurance does not completely cover the cost of the ZIO patch monitor. You must apply for the  Patient Assistance Program to qualify for the discounted rate. To apply, call [employer] at  [phone],  select option 4, select option 2 , ask to apply for the Patient Assistance Program, (you can request an  interpreter if needed). [employer] will ask your household income and how many people are in your  household. [employer] will quote your out-of-pocket cost based on this information. They will also be able  to set up a 12 month interest free payment plan if needed.  Applying the monitor   Shave hair from upper left chest.  Hold the abrader disc by orange tab. Rub the abrader in 40 strokes over left upper chest as indicated in  your monitor instructions.  Clean area with 4 enclosed alcohol pads. Use all pads to ensure the area is cleaned thoroughly. Let  dry.  Apply patch as indicated in monitor instructions. Patch will be placed under collarbone on left side of  chest with arrow pointing upward.  Rub patch adhesive wings for 2 minutes. Remove the white label marked "1". Remove the white label  marked "2". Rub patch adhesive wings for 2 additional minutes.  While looking in a mirror, press and release button in center of patch. A small green light will flash 3-4  times. This will be your only indicator that the monitor has been turned on.  Do not shower for the first 24 hours. You may shower after the first 24 hours.  Press the button if you feel a symptom. You will hear a small click. Record Date, Time and Symptom in  the Patient Log.   Starting the [employer]  In your kit  there is a small plastic box the size of a cellphone. This is Airline pilot. It transmits all your  recorded data to [facility]. This box must always stay within 10 feet of you. Open the box and push the *  button. There will be a light that blinks orange and then green a few times. When the light stops  blinking, the [place] is connected to the ZIO patch. Call [employer] at [phone] to confirm your monitor is transmitting.  Returning your monitor  Remove your patch and place it inside the [place]. In the  lower half of the [place] there is a white  bag with prepaid postage on it. Place Gateway in bag and seal. Mail package back to [place] as soon as  possible. Your physician should have your final report approximately 7 days after you have mailed back  your monitor. Call [facility] at [phone] if you have questions regarding your ZIO AT  patch monitor. Call them immediately if you see an orange light blinking on your monitor.  If your monitor falls off in less than 4 days, contact our Monitor department at [phone]. If your  monitor becomes loose or falls off after 4 days call [employer] at [phone] for suggestions on  securing your monitor   Follow-Up: At [facility], you and your health needs are our priority.  As part of our continuing mission to provide you with exceptional heart care, we have created designated Provider Care Teams.  These Care Teams include your primary Cardiologist (physician) and Advanced Practice Providers (APPs -  Physician Assistants and Nurse Practitioners) who all work together to provide you with the care you need, when you need it.  Your next appointment:   7-8 week(s)  The format for your next appointment:   In Person  Provider:   Almyra Deforest, PA-C  Other Instructions

## 2021-05-26 NOTE — Progress Notes (Signed)
Cardiology Office Note:    Date:  05/28/2021   ID:  Erika Wu, DOB [birth year], MRN [id number]  PCP:  Michael Boston, MD   [facility] Providers Cardiologist:  Peter Martinique, MD     Referring MD: Michael Boston, MD   Chief Complaint  Patient presents with   Follow-up    Seen for Dr. Martinique    History of Present Illness:    Erika Wu is a 75 y.o. female with a hx of hypertension, hyperlipidemia, moderate AI and history of chest pain but negative Myoview in July 2016.  She is a former patient of Dr. Mare Ferrari.  Echocardiogram in 2016 showed aortic root enlargement with moderate AI.  Patient was seen back in October 2017 with chest pain and palpitation.  Echocardiogram at that time showed severe aortic regurgitation with dilated aorta measuring at 4.4 cm, EF and LV dimension was normal.  Myoview was abnormal with evidence of apical ischemia.  She subsequently underwent cardiac catheterization in November 2017 that showed minimal nonobstructive CAD, normal EF and normal LVEDP.  She wore a heart monitor that was normal without arrhythmia.  Repeat echocardiogram in March 2021 showed normal EF with moderate AI.  Patient was last seen by Dr. Martinique in April 2022 at which time she has minimal shortness of breath however no significant chest discomfort.  She did have COVID back in January.  Echocardiogram obtained on 01/23/2021 showed EF 65 to 70%, grade 1 DD, mild LVH, moderate to severe AI with moderate aortic stenosis, borderline dilated ascending aorta measuring at 39 mm.  This was reviewed by Dr. Martinique who recommended continue with yearly echocardiogram.  CTA of the chest obtained on 03/30/2021 showed a significant thoracic aortic aneurysm measuring at 4.5 cm, 53-monthfollow-up imaging was recommended.  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  Recommend follow-up pre and postcontrast MRI/MRCP or pancreatic protocol CT in 6 months to ensure stability.  Recent HIDA scan obtained on 05/22/2021 was normal.  Patient presents today for follow-up.  For the past several weeks, she has been noticing intermittent tachycardia palpitation that last up to 5 minutes.  The symptoms can occur as an anytime.  She typically drink 1 to 2 glass of coffee per day.  She will need to cut back on caffeinated drinks.  Her PCP recently checked her thyroid, we will request the lab work.  Last TSH obtained in March 2022 at her PCPs office was normal.  Otherwise, I recommended 2-week ZIO AT monitor to follow-up on irregular heartbeat.  I plan to see the patient back in 6 weeks to review monitor results  Past Medical History:  Diagnosis Date   Allergy    Anxiety    Chest pain    a. 03/2015 Lexiscan MV: EF 59%, no ischemia/infarct.   Colon polyps    COVID-19 virus infection    09-22-2020, fully vax'd    Depression    GERD (gastroesophageal reflux disease)    Heart murmur    Hemorrhoids    Hyperlipidemia    Hypertension    Moderate aortic insufficiency    a. 03/2015 Echo: EF 55-60%, no rwma, mild LVH, Gr 1 DD, mild AS, mod AI (bicuspid AoV by report, poorly visualized on this study), Asc Ao diam 476m triv MR.    Past Surgical  History:  Procedure Laterality Date   CARDIAC CATHETERIZATION N/A 07/24/2016   Procedure: Left Heart Cath and Coronary Angiography;  Surgeon: Peter M Martinique, MD;  Location: [place] [facility];  Service: Cardiovascular;  Laterality: N/A;   COLONOSCOPY     POLYPECTOMY     TONSILLECTOMY AND ADENOIDECTOMY     TUBAL LIGATION      Current Medications: Current Meds  Medication Sig   amLODipine (NORVASC) 2.5 MG tablet Take 1 tablet (2.5 mg total) by mouth daily. (Patient taking differently: Take 2.5 mg by mouth 2 (two) times daily.)   buPROPion (WELLBUTRIN XL) 150 MG 24 hr  tablet Take 450 mg by mouth every morning.   metoprolol succinate (TOPROL-XL) 25 MG 24 hr tablet Take 1 tablet (25 mg total) by mouth daily.   mirtazapine (REMERON) 30 MG tablet Take 30 mg by mouth at bedtime.   pantoprazole (PROTONIX) 40 MG tablet Take 1 tablet (40 mg total) by mouth 2 (two) times daily.   rosuvastatin (CRESTOR) 10 MG tablet Take 1 tablet by mouth daily.   simethicone (GAS-X) 80 MG chewable tablet Chew 1 tablet (80 mg total) by mouth every 6 (six) hours as needed for flatulence. (Patient taking differently: Chew 80 mg by mouth as needed for flatulence.)   Suvorexant (BELSOMRA) 20 MG TABS Take by mouth.   traZODone (DESYREL) 50 MG tablet Take 50 mg by mouth as needed for sleep.   valsartan (DIOVAN) 80 MG tablet Take 80 mg by mouth 2 (two) times daily.   XIFAXAN 550 MG TABS tablet TAKE 1 TABLET(550 MG) BY MOUTH THREE TIMES DAILY FOR 14 DAYS     Allergies:   Patient has no known allergies.   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  Tobacco Use   Smoking status: Former    Packs/day: 0.50    Years: 15.00    Pack years: 7.50    Types: Cigarettes   Smokeless tobacco: Never  Vaping Use   Vaping Use: Never used  Substance and Sexual Activity   Alcohol use: Yes    Alcohol/week: 0.0 standard drinks    Comment: 2-3 per day   Drug use: No   Sexual activity: Not on file  Other Topics Concern   Not on file  Social History Narrative   Not on file   Social Determinants of Health   Financial Resource Strain: Not on file  Food Insecurity: Not on file  Transportation Needs: Not on file  Physical Activity: Not on file  Stress: Not on file  Social Connections: Not on file     Family History: The patient's  family history includes Colon cancer in her maternal grandmother; Diverticulosis in her maternal grandfather; HIV in her brother; Heart attack in her father; Heart disease in her father; Stomach cancer in her maternal grandmother; Stroke in her paternal grandfather. There is no history of Hypertension, Colon polyps, Esophageal cancer, or Rectal cancer.  ROS:   Please see the history of present illness.     All other systems reviewed and are negative.  EKGs/Labs/Other Studies Reviewed:    The following studies were reviewed today:  Echo 01/23/2021  1. Left ventricular ejection fraction, by estimation,  is 65 to 70%. The  left ventricle has normal function. The left ventricle has no regional  wall motion abnormalities. There is mild left ventricular hypertrophy.  Left ventricular diastolic parameters  are consistent with Grade I diastolic dysfunction (impaired relaxation).   2. Right ventricular systolic function is normal. The right ventricular  size is normal.   3. The mitral valve is normal in structure. No evidence of mitral valve  regurgitation. No evidence of mitral stenosis.   4. The aortic valve is normal in structure. Aortic valve regurgitation is  severe. Moderate aortic valve stenosis. Aortic regurgitation PHT measures  415 msec. Aortic valve mean gradient measures 18.0 mmHg. Aortic valve Vmax  measures 3.04 m/s.   5. There is borderline dilatation of the ascending aorta, measuring 39  mm.   6. The inferior vena cava is normal in size with greater than 50%  respiratory variability, suggesting right atrial pressure of 3 mmHg.   EKG:  EKG is ordered today.  The ekg ordered today demonstrates normal sinus rhythm, Q waves in the inferior leads.  Recent Labs: 03/28/2021: BUN 17; Creatinine, Ser 0.82; Potassium 4.7; Sodium 141  Recent Lipid Panel    Component Value Date/Time   CHOL 190 07/19/2016 1050   TRIG 77 07/19/2016 1050   HDL 86 07/19/2016 1050   CHOLHDL 2.2 07/19/2016  1050   VLDL 15 07/19/2016 1050   LDLCALC 89 07/19/2016 1050     Risk Assessment/Calculations:           Physical Exam:    VS:  BP (!) 127/57 (BP Location: Right Arm)   Pulse (!) 57   Ht '5\' 3"'  (1.6 m)   Wt 128 lb (58.1 kg)   SpO2 97%   BMI 22.67 kg/m     Wt Readings from Last 3 Encounters:  05/26/21 128 lb (58.1 kg)  05/16/21 126 lb 8 oz (57.4 kg)  04/04/21 123 lb 9.6 oz (56.1 kg)     GEN:  Well nourished, well developed in no acute distress HEENT: Normal NECK: No JVD; No carotid bruits LYMPHATICS: No lymphadenopathy CARDIAC: RRR, no murmurs, rubs, gallops RESPIRATORY:  Clear to auscultation without rales, wheezing or rhonchi  ABDOMEN: Soft, non-tender, non-distended MUSCULOSKELETAL:  No edema; No deformity  SKIN: Warm and dry NEUROLOGIC:  Alert and oriented x 3 PSYCHIATRIC:  Normal affect   ASSESSMENT:    1. Palpitations   2. Nonrheumatic aortic valve insufficiency   3. Primary hypertension   4. Hyperlipidemia LDL goal <100    PLAN:    In order of problems listed above:  Palpitation: We will obtain 2-week ZIO monitor.  She drinks 1 to 2 cups of coffee per day, will need to discontinue caffeinated drinks.  Aortic insufficiency: Last echocardiogram obtained in May 2022 continue to show moderate aortic stenosis and severe AI.  MD recommended repeat study in 12 months.  Hypertension: Blood pressure stable  Hyperlipidemia: Continue Crestor.  Medication Adjustments/Labs and Tests Ordered: Current medicines are reviewed at length with the patient today.  Concerns regarding medicines are outlined above.  Orders Placed This Encounter  Procedures   LONG TERM MONITOR-LIVE TELEMETRY (3-14 DAYS)   EKG 12-Lead   No orders of the defined types were placed in this encounter.   Patient Instructions  Medication Instructions:  Your physician recommends that you continue on your current medications as directed. Please refer to the Current Medication list given to you  today.  *If you need a refill on your cardiac medications before  your next appointment, please call your pharmacy*  Lab Work: NONE ordered at this time of appointment   If you have labs (blood work) drawn today and your tests are completely normal, you will receive your results only by: [place] (if you have MyChart) OR A paper copy in the mail If you have any lab test that is abnormal or we need to change your treatment, we will call you to review the results.  Testing/Procedures: ZIO AT Long term monitor-Live Telemetry  Your physician has requested you wear a ZIO patch monitor for 14 days.  This is a single patch monitor. [employer] supplies one patch monitor per enrollment. Additional  stickers are not available.  Please do not apply patch if you will be having a Nuclear Stress Test, Echocardiogram, Cardiac CT, MRI,  or Chest Xray during the period you would be wearing the monitor. The patch cannot be worn during  these tests. You cannot remove and re-apply the ZIO AT patch monitor.  Your ZIO patch monitor will be mailed 3 day USPS to your address on file. It may take 3-5 days to  receive your monitor after you have been enrolled.  Once you have received your monitor, please review the enclosed instructions. Your monitor has  already been registered assigning a specific monitor serial # to you.   Billing and Patient Assistance Program information  Theodore Demark has been supplied with any insurance information on record for billing. [employer] offers a sliding scale Patient Assistance Program for patients without insurance, or whose  insurance does not completely cover the cost of the ZIO patch monitor. You must apply for the  Patient Assistance Program to qualify for the discounted rate. To apply, call [employer] at [phone],  select option 4, select option 2 , ask to apply for the Patient Assistance Program, (you can request an  interpreter if needed). [employer] will ask your household  income and how many people are in your  household. [employer] will quote your out-of-pocket cost based on this information. They will also be able  to set up a 12 month interest free payment plan if needed.  Applying the monitor   Shave hair from upper left chest.  Hold the abrader disc by orange tab. Rub the abrader in 40 strokes over left upper chest as indicated in  your monitor instructions.  Clean area with 4 enclosed alcohol pads. Use all pads to ensure the area is cleaned thoroughly. Let  dry.  Apply patch as indicated in monitor instructions. Patch will be placed under collarbone on left side of  chest with arrow pointing upward.  Rub patch adhesive wings for 2 minutes. Remove the white label marked "1". Remove the white label  marked "2". Rub patch adhesive wings for 2 additional minutes.  While looking in a mirror, press and release button in center of patch. A small green light will flash 3-4  times. This will be your only indicator that the monitor has been turned on.  Do not shower for the first 24 hours. You may shower after the first 24 hours.  Press the button if you feel a symptom. You will hear a small click. Record Date, Time and Symptom in  the Patient Log.   Starting the Gateway  In your kit there is a Hydrographic surveyor box the size of a cellphone. This is Airline pilot. It transmits all your  recorded data to [facility]. This box must always stay within 10 feet of you. Open the box  and push the *  button. There will be a light that blinks orange and then green a few times. When the light stops  blinking, the Gateway is connected to the ZIO patch. Call [employer] at [phone] to confirm your monitor is transmitting.  Returning your monitor  Remove your patch and place it inside the [place]. In the lower half of the Gateway there is a white  bag with prepaid postage on it. Place Gateway in bag and seal. Mail package back to [place] as soon as  possible. Your physician should  have your final report approximately 7 days after you have mailed back  your monitor. Call [facility] at [phone] if you have questions regarding your ZIO AT  patch monitor. Call them immediately if you see an orange light blinking on your monitor.  If your monitor falls off in less than 4 days, contact our Monitor department at [phone]. If your  monitor becomes loose or falls off after 4 days call [employer] at [phone] for suggestions on  securing your monitor   Follow-Up: At [facility], you and your health needs are our priority.  As part of our continuing mission to provide you with exceptional heart care, we have created designated Provider Care Teams.  These Care Teams include your primary Cardiologist (physician) and Advanced Practice Providers (APPs -  Physician Assistants and Nurse Practitioners) who all work together to provide you with the care you need, when you need it.  Your next appointment:   7-8 week(s)  The format for your next appointment:   In Person  Provider:   Almyra Deforest, PA-C  Other Instructions     Signed, Almyra Deforest, [place]  05/28/2021 11:43 PM    [place]

## 2021-05-28 ENCOUNTER — Encounter: Payer: Self-pay | Admitting: Physician Assistant

## 2021-05-29 ENCOUNTER — Telehealth: Payer: Self-pay | Admitting: Cardiology

## 2021-05-29 NOTE — Telephone Encounter (Signed)
Called patient, she states that on Tuesday last week her PCP started her on Valsartan 80 mg daily (med list states twice daily, but this is not correct) she is also taking Amlodipine 2.5 mg twice daily (5 mg) increased last week due to higher blood pressure. Patient states that now her blood pressure is running 126/56 HR 57 and been running this way since the new medication was added. She states that she just feels out of it, and tired and just not feeling well. Patient states that she believes she is on too much medication now. Patient had a visit on Friday with PA- bp at this visit was about the same. Advised I would route a message over to Dr.Jordan/PharmD/PA that just seen patient and call back with recommendations.

## 2021-05-29 NOTE — Telephone Encounter (Signed)
.  Pt c/o medication issue:  1. Name of Medication: amLODipine (NORVASC) 2.5 MG tablet  valsartan (DIOVAN) 80 MG tablet  2. How are you currently taking this medication (dosage and times per day)? 1 tablet by mouth twice day  3. Are you having a reaction (difficulty breathing--STAT)? no  4. What is your medication issue? Patient is asking if she needs to be taking both medication. She stated she was very tired over the weekend and her bp was low as well

## 2021-05-29 NOTE — Telephone Encounter (Signed)
Called patient - advised of message below.  °Patient verbalized understanding.  ° °

## 2021-05-31 LAB — PANCREATIC ELASTASE, FECAL: Pancreatic Elastase-1, Stool: 363 mcg/g

## 2021-06-01 ENCOUNTER — Telehealth: Payer: Self-pay | Admitting: Gastroenterology

## 2021-06-01 NOTE — Telephone Encounter (Signed)
Inbound call from patient returning call. 

## 2021-06-01 NOTE — Telephone Encounter (Signed)
Addressed call in previously created encounter (refer to lab results)

## 2021-06-07 ENCOUNTER — Other Ambulatory Visit: Payer: Medicare PPO

## 2021-06-08 ENCOUNTER — Telehealth: Payer: Self-pay | Admitting: Physician Assistant

## 2021-06-08 ENCOUNTER — Telehealth: Payer: Self-pay | Admitting: *Deleted

## 2021-06-08 DIAGNOSIS — R002 Palpitations: Secondary | ICD-10-CM | POA: Diagnosis not present

## 2021-06-08 NOTE — Telephone Encounter (Signed)
According to [place] tracking, monitor was delivered to address on 06/02/21, 11:56AM at or in [place]. Address on file was [address] [place], [place] [address]. If this is not the case , please call Daeron Carreno in monitors at [phone] and we will have a replacement monitor shipped to address on file.

## 2021-06-08 NOTE — Telephone Encounter (Signed)
New message:    Patient calling because she has not recevied her ZO patch

## 2021-06-08 NOTE — Telephone Encounter (Signed)
Patient received monitor today and has applied it.

## 2021-06-22 ENCOUNTER — Encounter: Payer: Self-pay | Admitting: Gastroenterology

## 2021-06-22 ENCOUNTER — Ambulatory Visit (INDEPENDENT_AMBULATORY_CARE_PROVIDER_SITE_OTHER): Payer: Medicare PPO | Admitting: Gastroenterology

## 2021-06-22 VITALS — BP 118/62 | HR 58 | Ht 61.5 in | Wt 129.0 lb

## 2021-06-22 DIAGNOSIS — K869 Disease of pancreas, unspecified: Secondary | ICD-10-CM

## 2021-06-22 DIAGNOSIS — R142 Eructation: Secondary | ICD-10-CM

## 2021-06-22 DIAGNOSIS — R140 Abdominal distension (gaseous): Secondary | ICD-10-CM | POA: Diagnosis not present

## 2021-06-22 NOTE — Progress Notes (Addendum)
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    - normal pancreatic elastase Gas, eructation, bloating, nausea and dysphonia    -symptoms persist despite daily proton pump inhibitor therapy    - temporally worsened at time of mother's death    - symptoms improve while on Xifaxan, return within 1 week of rx dicontinuation Reflux and reactive gastropathy on EGD 02/03/21 Recent weight loss following the death of her 59 year old mother History of colon polyps    - TVA 2000, TA 2004, TA 2015, TA and SSP 10/31/20    - surveillance colonoscopy recommended in 2027 History of esophageal stricture s/p dilation 2010  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 Seems unlikely that the pancreatic head lesion is the cause of her symptoms given the stability and normal pancreatic elastase.     PLAN: - Continue pantoprazole to 40 mg BID - Breath test for SIBO to look for methane - Diet changes: Avoid dairy, sugar and sugar substitutes - Follow-up MRI/MRCP 08/2021 - Discussed lowFODMAP diet but she is concerned about the diet changes prior to the holidays and opted not to proceed at this time - Follow-up after the MRI, earlier if needed - EUS prior to follow-up MRI with ongoing weight loss  I spent over 30 minutes, including in depth chart review, face-to-face time with the patient, coordinating care, and ordering studies and medications as appropriate, and documentation.   HPI: Returns in scheduled follow-up for gas, eructation, and bloating. She was last seen in the office 05/16/21. She has anxiety, depression, hypertension, osteoporosis and aortic insufficiency. She has a history of colonoscopy polyps with removal of a tubulovillous adenoma in 2000, a tubular adenoma in 2004, a tubular adenoma in 2015, and on her most recently colonoscopy 10/31/20 a tubular adenoma, a  sessile serrated polyp, and a hyperplastic polyp.   She reports a lifetime history of a "nervous" stomach.  Now with at least 20 years of frequent gas, eructation, and flatus.  Over the last several months she has also developed intermittent nausea and dysphonia.  Symptoms worse after the death of her mother in 2022/10/15. She reports regular bowel habits.  She will have 1 formed bowel movement in the morning followed by several smaller less formed bowel movements.  There is an ongoing sense of incomplete evacuation.  No history of manual assistance with defecation.  No blood or mucus in the stool.  She drinks 2-3 seltzer waters daily.  She does not regularly consume foods using sugar substitutes.  No known history of lactose intolerance.  Dr. Jacalyn Lefevre suggested that she avoid dairy but she loves cheese and ice cream.    Recent labs: - November 14, 2020 show a normal CBC and CMP - June 22, 2021 normal pancreatic elastase  Abdominal imaging: - Abdominal ultrasound 2010 for abdominal pain and reflux was normal.  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 No solid mass or pancreatic ductal dilation.  - HIDA with CCK 05/22/21: patent cystic and common bile ducts, GBEF 53%  Prior Endoscopic history:: - EGD for dysphagia 01/13/2009: early peptic stricture dilated with an 18 mm Maloney with no heme - EGD for dyspepsia 11/11/2013: gastric polyps -  EGD 02/03/2021 that showed an irregular Z-line, gastritis, and gastric polyps.  Esophageal biopsies confirmed reflux.  Gastric biopsies confirmed fundic gland polyps.  She had reactive gastropathy with no H. pylori.  Duodenal biopsies showed peptic duodenitis.  Treatment trials: - Omeprazole to 40 mg BID provided minimal relief - Gas-Ex provided no relief - Xifaxan resulted in one formed  BM daily, symptoms returned one week after completing treatment - 14 additional days of Xifaxan improved symptoms while on therapy  Returns today in follow-up. Some improvement in symptoms with diet modification and 14 days of Xifaxan. While on Xifaxan, her symptoms were complete controlled. The bloating has resolved. Since completing Xifaxan she has developed constipation that she is successfully treating with fiber gummies with a formed bowel movement daily.  No new complaints or concerns.   Past Medical History:  Diagnosis Date   Allergy    Anxiety    Chest pain    a. 03/2015 Lexiscan MV: EF 59%, no ischemia/infarct.   Colon polyps    COVID-19 virus infection    09-22-2020, fully vax'd    Depression    GERD (gastroesophageal reflux disease)    Heart murmur    Hemorrhoids    Hyperlipidemia    Hypertension    Moderate aortic insufficiency    a. 03/2015 Echo: EF 55-60%, no rwma, mild LVH, Gr 1 DD, mild AS, mod AI (bicuspid AoV by report, poorly visualized on this study), Asc Ao diam 20mm, triv MR.    Past Surgical History:  Procedure Laterality Date   CARDIAC CATHETERIZATION N/A 07/24/2016   Procedure: Left Heart Cath and Coronary Angiography;  Surgeon: Peter M Martinique, MD;  Location: [place] [facility];  Service: Cardiovascular;  Laterality: N/A;   COLONOSCOPY     POLYPECTOMY     TONSILLECTOMY AND ADENOIDECTOMY     TUBAL LIGATION       Allergies as of 06/22/2021   (No Known Allergies)    Family History  Problem Relation Age of Onset   Heart attack Father        Died @ 36.  Had CABG in his 47's.   Heart disease Father    Stomach cancer Maternal Grandmother    Colon cancer Maternal Grandmother    Diverticulosis Maternal Grandfather    Stroke Paternal Grandfather    HIV Brother    Hypertension Neg Hx    Colon polyps Neg Hx    Esophageal cancer Neg Hx    Rectal cancer Neg Hx      Physical Exam: General:   Alert,  well-nourished, pleasant and cooperative in NAD,  hearing loss Head:  Normocephalic and atraumatic. Eyes:  Sclera clear, no icterus.   Conjunctiva pink. Abdomen:  Soft, nontender, nondistended, normal bowel sounds, no rebound or guarding. No hepatosplenomegaly.   Neurologic:  Alert and  oriented x4;  grossly nonfocal Skin:  Intact without significant lesions or rashes. Psych:  Alert and cooperative. Normal mood and affect.    Damain Broadus L. Tarri Glenn, MD, MPH 06/22/2021, 10:16 AM

## 2021-06-22 NOTE — Patient Instructions (Signed)
If you are age 74 or older, your body mass index should be between 23-30. Your Body mass index is 23.98 kg/m. If this is out of the aforementioned range listed, please consider follow up with your Primary Care Provider. __________________________________________________________  The [place] [facility] providers would like to encourage you to use [facility] to communicate with providers for non-urgent requests or questions.  Due to long hold times on the telephone, sending your provider a message by [facility] may be a faster and more efficient way to get a response.  Please allow 48 business hours for a response.  Please remember that this is for non-urgent requests.   - CONTINUE: pantoprazole to 40 mg twice daily  You have been given a testing kit to check for small intestine bacterial overgrowth (SIBO) which is completed by a company named [facility]. Make sure to return your test in the mail using the return mailing label given to you along with the kit. Your demographic and insurance information have already been sent to the company and they should be in contact with you over the next week regarding this test. [facility] will collect an upfront charge of $99.74 for commercial insurance plans and $209.74 is you are paying cash. Make sure to discuss with [facility] PRIOR to having the test if they have gotten informatoin from your insurance company as to how much your testing will cost out of pocket, if any. Please keep in mind that you will be getting a call from phone number [phone] or a similar number. If you do not hear from them within this time frame, please call our office at [phone].   - Diet changes: Avoid dairy, sugar and sugar substitutes  You have been scheduled for an MRI at [facility] on 08-15-21. Your appointment time is 10:00am. Please arrive to admitting (at main entrance of the hospital) 30 minutes prior to your appointment time for registration purposes. Please  make certain not to have anything to eat or drink 6 hours prior to your test. In addition, if you have any metal in your body, have a pacemaker or defibrillator, please be sure to let your ordering physician know. This test typically takes 45 minutes to 1 hour to complete. Should you need to reschedule, please call [phone] to do so.  - Discussed low FODMAP; handout given.  You are scheduled to follow up with our office on 08-21-21 at 1:30pm.  Due to recent changes in healthcare laws, you may see the results of your imaging and laboratory studies on MyChart before your provider has had a chance to review them.  We understand that in some cases there may be results that are confusing or concerning to you. Not all laboratory results come back in the same time frame and the provider may be waiting for multiple results in order to interpret others.  Please give [place] 48 hours in order for your provider to thoroughly review all the results before contacting the office for clarification of your results.   Thank you for entrusting me with your care and choosing [facility].  Dr Tarri Glenn

## 2021-06-26 ENCOUNTER — Telehealth: Payer: Self-pay | Admitting: Cardiology

## 2021-06-26 NOTE — Telephone Encounter (Signed)
Patient called stating she doesn't not have the return label to return the zio monitor.

## 2021-06-27 NOTE — Telephone Encounter (Signed)
Please disregard previous message regarding blue box ZIO monitor came in.  That was for an XT model and you have an AT model. In your kit there was a small black box about the size of a cell phone that you had to keep within 10 feet of you at all times.  This is called the Gateway.  If you open up the gateway a small white shipping back is in the bottom half.  Remove your ZIO patch and put it inside the Gateway, (you will see a notch for the recorder to fit in the Gateway).  Place the Gateway into the white bag and seal.  There is a prepaid USPS postage label on the white bag.  If you need additional assistance, please contact Erika Wu  in monitor department at [phone].

## 2021-06-27 NOTE — Telephone Encounter (Deleted)
The ZIO patch monitor came in a blue box approximately 5"x5"x1".  Stick the ZIO XT patch to the last page of the patient log book, put the log book in the blue box and tape shut.  There is a [employer] administrator label on the blue box. If you have disposed of the blue box, please call Erika Wu at [phone] to have a replacement mailed to you or, make arrangement for you to pick up a replacement shipping box at our [place] office.

## 2021-07-06 ENCOUNTER — Other Ambulatory Visit: Payer: Self-pay

## 2021-07-06 MED ORDER — METOPROLOL SUCCINATE ER 25 MG PO TB24: 25.0000 mg | ORAL_TABLET | Freq: Two times a day (BID) | ORAL | 3 refills | Status: DC

## 2021-07-21 ENCOUNTER — Encounter: Payer: Self-pay | Admitting: Medical

## 2021-07-21 ENCOUNTER — Ambulatory Visit: Payer: Medicare PPO | Admitting: Medical

## 2021-07-21 ENCOUNTER — Other Ambulatory Visit: Payer: Self-pay

## 2021-07-21 VITALS — BP 126/60 | HR 67 | Ht 63.0 in | Wt 128.6 lb

## 2021-07-21 DIAGNOSIS — I10 Essential (primary) hypertension: Secondary | ICD-10-CM | POA: Diagnosis not present

## 2021-07-21 DIAGNOSIS — I471 Supraventricular tachycardia: Secondary | ICD-10-CM

## 2021-07-21 DIAGNOSIS — I2510 Atherosclerotic heart disease of native coronary artery without angina pectoris: Secondary | ICD-10-CM

## 2021-07-21 DIAGNOSIS — I351 Nonrheumatic aortic (valve) insufficiency: Secondary | ICD-10-CM | POA: Diagnosis not present

## 2021-07-21 DIAGNOSIS — E785 Hyperlipidemia, unspecified: Secondary | ICD-10-CM

## 2021-07-21 DIAGNOSIS — I350 Nonrheumatic aortic (valve) stenosis: Secondary | ICD-10-CM | POA: Diagnosis not present

## 2021-07-21 DIAGNOSIS — I7121 Aneurysm of the ascending aorta, without rupture: Secondary | ICD-10-CM

## 2021-07-21 MED ORDER — METOPROLOL SUCCINATE ER 25 MG PO TB24: ORAL_TABLET | ORAL | 1 refills | Status: DC

## 2021-07-21 NOTE — Patient Instructions (Addendum)
Medication Instructions:  INCREASE Metoprolol Succinate (Toprol-XL) to 25 mg in the mornings and 50 mg (2 tablets) in the evenings *If you need a refill on your cardiac medications before your next appointment, please call your pharmacy*  Lab Work: NONE ordered at this time of appointment   If you have labs (blood work) drawn today and your tests are completely normal, you will receive your results only by: [place] (if you have MyChart) OR A paper copy in the mail If you have any lab test that is abnormal or we need to change your treatment, we will call you to review the results.  Testing/Procedures: Your physician has requested that you have an echocardiogram. Echocardiography is a painless test that uses sound waves to create images of your heart. It provides your doctor with information about the size and shape of your heart and how well your heart's chambers and valves are working. This procedure takes approximately one hour. There are no restrictions for this procedure.  Please schedule for 1-2 weeks   Follow-Up: At [facility], you and your health needs are our priority.  As part of our continuing mission to provide you with exceptional heart care, we have created designated Provider Care Teams.  These Care Teams include your primary Cardiologist (physician) and Advanced Practice Providers (APPs -  Physician Assistants and Nurse Practitioners) who all work together to provide you with the care you need, when you need it.  Your next appointment:   4 month(s)  The format for your next appointment:   In Person  Provider:   Peter Martinique, MD    Other Instructions You have been referred to Cardiothoracic Surgery

## 2021-07-21 NOTE — Progress Notes (Signed)
Cardiology Office Note   Date:  07/21/2021   ID:  Erika, Wu October 03, 1945, MRN [id number]  PCP:  Michael Boston, MD  Cardiologist:  Peter Martinique, MD EP: None  Chief Complaint  Patient presents with   Follow-up    Palpitations       History of Present Illness: Erika Wu is a 75 y.o. female with a PMH of nonobstructive CAD, HTN, HLD, severe AI/moderate AS, GERD, depression who presents for follow-up of palpitations  She was last evaluated by cardiology at an outpatient visit with Almyra Deforest, PA-C 05/26/2021 for the evaluation of intermittent tachypalpitations.  She was recommended to limit her caffeine intake and undergo a 2-week Zio patch monitor.  Her cardiac monitor revealed intermittent short bursts of SVT with longest episode lasting 2 minutes and 54 seconds.  This was felt to be the etiology of her palpitations and she was recommended to increase her metoprolol to 25 mg twice daily.  Her last echocardiogram 01/2021 showed EF 65 to 70%, mild LVH, G1 DD, no R WMA, severe AI, and moderate AAS with mean gradient 18 mmHg and peak gradient 36.8 mmHg.  She was recommended for ongoing annual monitoring.  Her last ischemic evaluation was a LHC in 2017 which showed 20% proximal LAD stenosis, 10% proximal-mid RCA stenosis, and EF 55-65% with normal LVEDP.  She presents today for follow-up of her palpitations.  She reports overall they are improved but still occurring on a regular basis.  Over the past several weeks she has had a sensation of tachypalpitations, shortness of breath, and chest tightness upon waking.  This does not wake her from sleep and usually improves within 15 minutes after getting up and moving around.  She notes this will occasionally reoccur with exertion and is improved with deep breathing exercises.  She has not had any syncope, dizziness, lightheadedness, lower extremity edema.  She asks about ongoing monitoring of her aortic aneurysm and aortic valve disease.   She would like a referral to the cardiothoracic surgery team.   Past Medical History:  Diagnosis Date   Allergy    Anxiety    Chest pain    a. 03/2015 Lexiscan MV: EF 59%, no ischemia/infarct.   Colon polyps    COVID-19 virus infection    09-22-2020, fully vax'd    Depression    GERD (gastroesophageal reflux disease)    Heart murmur    Hemorrhoids    Hyperlipidemia    Hypertension    Moderate aortic insufficiency    a. 03/2015 Echo: EF 55-60%, no rwma, mild LVH, Gr 1 DD, mild AS, mod AI (bicuspid AoV by report, poorly visualized on this study), Asc Ao diam 37mm, triv MR.    Past Surgical History:  Procedure Laterality Date   CARDIAC CATHETERIZATION N/A 07/24/2016   Procedure: Left Heart Cath and Coronary Angiography;  Surgeon: Peter M Martinique, MD;  Location: [place] [facility];  Service: Cardiovascular;  Laterality: N/A;   COLONOSCOPY     POLYPECTOMY     TONSILLECTOMY AND ADENOIDECTOMY     TUBAL LIGATION       Current Outpatient Medications  Medication Sig Dispense Refill   amLODipine (NORVASC) 2.5 MG tablet Take 1 tablet (2.5 mg total) by mouth daily. 90 tablet 3   buPROPion (WELLBUTRIN XL) 150 MG 24 hr tablet Take 450 mg by mouth every morning.  3   FIBER SELECT GUMMIES PO Take by mouth.     mirtazapine (REMERON) 30 MG  tablet Take 30 mg by mouth at bedtime.     pantoprazole (PROTONIX) 40 MG tablet Take 40 mg by mouth daily. Take 2 tablets by mouth one in the morning and one in the evening.     rosuvastatin (CRESTOR) 10 MG tablet Take 1 tablet by mouth daily.     simethicone (GAS-X) 80 MG chewable tablet Chew 1 tablet (80 mg total) by mouth every 6 (six) hours as needed for flatulence. (Patient taking differently: Chew 80 mg by mouth as needed for flatulence.) 30 tablet 0   Suvorexant (BELSOMRA) 20 MG TABS Take by mouth.     traZODone (DESYREL) 50 MG tablet Take 50 mg by mouth as needed for sleep.     valsartan (DIOVAN) 80 MG tablet Take 80 mg by mouth daily. Take 0.5 tablet  by mouth two times daily.     Vitamin D-Vitamin K (VITAMIN K2-VITAMIN D3) 45-2000 MCG-UNIT CAPS once a week.     metoprolol succinate (TOPROL-XL) 25 MG 24 hr tablet Take 1 tablet (25 mg total) by mouth every morning AND 2 tablets (50 mg total) every evening. 270 tablet 1   No current facility-administered medications for this visit.    Allergies:   Patient has no known allergies.    Social History:  The patient  reports that she has quit smoking. Her smoking use included cigarettes. She has a 7.50 pack-year smoking history. She has never used smokeless tobacco. She reports current alcohol use. She reports that she does not use drugs.   Family History:  The patient's family history includes Colon cancer in her maternal grandmother; Diverticulosis in her maternal grandfather; HIV in her brother; Heart attack in her father; Heart disease in her father; Stomach cancer in her maternal grandmother; Stroke in her paternal grandfather.    ROS:  Please see the history of present illness.   Otherwise, review of systems are positive for none.   All other systems are reviewed and negative.    PHYSICAL EXAM: VS:  BP 126/60 (BP Location: Right Arm, Patient Position: Sitting, Cuff Size: Normal)   Pulse 67   Ht 5\' 3"  (1.6 m)   Wt 128 lb 9.6 oz (58.3 kg)   SpO2 97%   BMI 22.78 kg/m  , BMI Body mass index is 22.78 kg/m. GEN: Well nourished, well developed, in no acute distress HEENT: Sclera anicteric Neck: no JVD, carotid bruits, or masses Cardiac: RRR; no murmurs, rubs, or gallops, no edema  Respiratory:  clear to auscultation bilaterally, normal work of breathing GI: soft, nontender, nondistended, + BS MS: no deformity or atrophy Skin: warm and dry, no rash Neuro:  Strength and sensation are intact Psych: euthymic mood, full affect   EKG:  EKG is not ordered today.   Recent Labs: 03/28/2021: BUN 17; Creatinine, Ser 0.82; Potassium 4.7; Sodium 141    Lipid Panel    Component Value  Date/Time   CHOL 190 07/19/2016 1050   TRIG 77 07/19/2016 1050   HDL 86 07/19/2016 1050   CHOLHDL 2.2 07/19/2016 1050   VLDL 15 07/19/2016 1050   LDLCALC 89 07/19/2016 1050      Wt Readings from Last 3 Encounters:  07/21/21 128 lb 9.6 oz (58.3 kg)  06/22/21 129 lb (58.5 kg)  05/26/21 128 lb (58.1 kg)      Other studies Reviewed: Additional studies/ records that were reviewed today include:   Cardiac monitor 06/2021: Normal sinus rhythm 6 episode of nonsustained VT 4-7 beats Episodes of SVT- appear reentrant  with sudden onset and termination. longest lasting 2 minutes and 54 seconds at rate 156 bpm     Patch Wear Time:  14 days and 0 hours (2022-09-29T15:04:27-0400 to 2022-10-13T15:04:27-0400)   Patient had a min HR of 50 bpm, max HR of 188 bpm, and avg HR of 69 bpm. Predominant underlying rhythm was Sinus Rhythm. 6 Ventricular Tachycardia runs occurred, the run with the fastest interval lasting 4 beats with a max rate of 188 bpm, the longest  lasting 7 beats with an avg rate of 128 bpm. 18 Supraventricular Tachycardia runs occurred, the run with the fastest interval lasting 2 mins 54 secs with a max rate of 156 bpm (avg 143 bpm); the run with the fastest interval was also the longest.  Supraventricular Tachycardia was detected within +/- 45 seconds of symptomatic patient event(s). Isolated SVEs were rare (<1.0%), SVE Couplets were rare (<1.0%), and SVE Triplets were rare (<1.0%). Isolated VEs were rare (<1.0%, 2958), VE Couplets were  rare (<1.0%, 115), and VE Triplets were rare (<1.0%, 7).     Echocardiogram 01/2021: 1. Left ventricular ejection fraction, by estimation, is 65 to 70%. The  left ventricle has normal function. The left ventricle has no regional  wall motion abnormalities. There is mild left ventricular hypertrophy.  Left ventricular diastolic parameters  are consistent with Grade I diastolic dysfunction (impaired relaxation).   2. Right ventricular systolic  function is normal. The right ventricular  size is normal.   3. The mitral valve is normal in structure. No evidence of mitral valve  regurgitation. No evidence of mitral stenosis.   4. The aortic valve is normal in structure. Aortic valve regurgitation is  severe. Moderate aortic valve stenosis. Aortic regurgitation PHT measures  415 msec. Aortic valve mean gradient measures 18.0 mmHg. Aortic valve Vmax  measures 3.04 m/s.   5. There is borderline dilatation of the ascending aorta, measuring 39  mm.   6. The inferior vena cava is normal in size with greater than 50%  respiratory variability, suggesting right atrial pressure of 3 mmHg.   [place] 2017: The left ventricular systolic function is normal. LV end diastolic pressure is normal. The left ventricular ejection fraction is 55-65% by visual estimate. Prox LAD lesion, 20 %stenosed. Prox RCA to Mid RCA lesion, 10 %stenosed.   1. Mild nonobstructive CAD 2. Normal LV function 3. Normal LVEDP   Plan: medical management.  ASSESSMENT AND PLAN:  1.  Paroxysmal SVT: Noted to have intermittent episodes lasting less than 3 minutes on recent cardiac monitor for which her metoprolol was increased to 25 mg twice daily.  She reports overall improvement in symptoms since that time, though still occurring on occasion most notably upon waking in the morning. -Will increase metoprolol to 25 mg in the morning and 50 mg every afternoon for further improvement in palpitations -We reviewed vagal maneuvers for persistent tachypalpitations  2.  Aortic valve disease: She has severe AI and moderate AS on echo 01/2021 recommended for annual monitoring.  Over the past several weeks she has noted occasional shortness of breath and chest tightness, typically upon wakening and occasionally with activity throughout the day.  No complaints of syncope or swelling -We will repeat an echocardiogram f to reevaluate aortic stenosis/insufficiency -We will refer to [facility] for  further evaluation -suspect she will ultimately be a candidate for SAVR given overall health and activity capacity  3.  HTN: BP1 126/60 today -Continue metoprolol, amlodipine, and valsartan  4.  HLD: LDL 105 11/2020 -Continue  Crestor  5.  Nonobstructive CAD: Mild disease noted on cath in 2017.  Chest pain overall does not sound anginal in nature.  Doubtful she has had significant progression of CAD since 2017. -We will follow up echocardiogram as above and evaluate LV function and wall motion -Continue aggressive risk factor modifications to promote BP <130/80, LDL <70, and A1c <7  6.  Ascending aortic aneurysm: Measuring 4.5 cm on CTA chest 03/2021.  Generally stable over the past several years.  She would like a referral to [facility] -We will refer to [facility] -Continue aggressive blood pressure management   Current medicines are reviewed at length with the patient today.  The patient does not have concerns regarding medicines.  The following changes have been made: As above  Labs/ tests ordered today include:   Orders Placed This Encounter  Procedures   Ambulatory referral to Cardiothoracic Surgery   ECHOCARDIOGRAM COMPLETE     Disposition:   FU with Dr. Martinique in 4 months  Signed, Abigail Butts, PA-C  07/21/2021 3:46 PM

## 2021-08-01 ENCOUNTER — Ambulatory Visit (INDEPENDENT_AMBULATORY_CARE_PROVIDER_SITE_OTHER): Payer: Medicare PPO

## 2021-08-01 ENCOUNTER — Other Ambulatory Visit: Payer: Self-pay

## 2021-08-01 DIAGNOSIS — I350 Nonrheumatic aortic (valve) stenosis: Secondary | ICD-10-CM

## 2021-08-01 LAB — ECHOCARDIOGRAM COMPLETE
AR max vel: 0.85 cm2
AV Area VTI: 1 cm2
AV Area mean vel: 0.93 cm2
AV Mean grad: 19 mmHg
AV Peak grad: 36.2 mmHg
AV Vena cont: 1.33 cm
Ao pk vel: 3.01 m/s
Area-P 1/2: 1.89 cm2
Calc EF: 63.7 %
MV M vel: 4.72 m/s
MV Peak grad: 89.1 mmHg
P 1/2 time: 492 msec
S' Lateral: 3.9 cm
Single Plane A2C EF: 66.1 %
Single Plane A4C EF: 62.7 %

## 2021-08-04 ENCOUNTER — Other Ambulatory Visit (HOSPITAL_COMMUNITY): Payer: Self-pay | Admitting: *Deleted

## 2021-08-07 ENCOUNTER — Ambulatory Visit (HOSPITAL_COMMUNITY)
Admission: RE | Admit: 2021-08-07 | Discharge: 2021-08-07 | Disposition: A | Discharge status: Home / Self Care | Payer: Medicare PPO | Source: Ambulatory Visit | Attending: Internal Medicine | Admitting: Internal Medicine

## 2021-08-07 DIAGNOSIS — M810 Age-related osteoporosis without current pathological fracture: Secondary | ICD-10-CM | POA: Insufficient documentation

## 2021-08-07 MED ORDER — DENOSUMAB 60 MG/ML ~~LOC~~ SOSY
60.0000 mg | PREFILLED_SYRINGE | Freq: Once | SUBCUTANEOUS | Status: AC
  Administered 2021-08-07: 11:00:00 60 mg via SUBCUTANEOUS

## 2021-08-07 MED ORDER — DENOSUMAB 60 MG/ML ~~LOC~~ SOSY
PREFILLED_SYRINGE | SUBCUTANEOUS | Status: AC
  Filled 2021-08-07: qty 1

## 2021-08-11 ENCOUNTER — Telehealth: Payer: Self-pay | Admitting: Gastroenterology

## 2021-08-11 NOTE — Telephone Encounter (Signed)
Breath test for SIBO to look for methane - Do you have these results?

## 2021-08-11 NOTE — Telephone Encounter (Signed)
Called [facility] to follow up on the status of her results. Spoke with Waunita Schooner. States they do not have any results to provide at this time. Further advised it does not appear pt has returned kit for processing. Immediately called pt to inform her about this conversation and to provide her with the following contact # [phone]. Seems pt will need to discuss further with [facility]. Pt phone rand x4 then proceeded to beep repeatedly as though # was disconnected, then actually disconnected.

## 2021-08-14 NOTE — Telephone Encounter (Signed)
Pt returned call. Advised we have not received results and about my conversation as documented below. Provided pt with contact # for her to call [facility] to f/u on status of her specimen.

## 2021-08-15 ENCOUNTER — Other Ambulatory Visit: Payer: Self-pay

## 2021-08-15 ENCOUNTER — Ambulatory Visit (HOSPITAL_COMMUNITY): Admission: RE | Admit: 2021-08-15 | Payer: Medicare PPO | Source: Ambulatory Visit

## 2021-08-15 ENCOUNTER — Institutional Professional Consult (permissible substitution): Payer: Medicare PPO | Admitting: Thoracic Surgery (Cardiothoracic Vascular Surgery)

## 2021-08-15 ENCOUNTER — Other Ambulatory Visit: Payer: Self-pay | Admitting: Gastroenterology

## 2021-08-15 ENCOUNTER — Encounter: Payer: Self-pay | Admitting: Thoracic Surgery (Cardiothoracic Vascular Surgery)

## 2021-08-15 ENCOUNTER — Other Ambulatory Visit: Payer: Self-pay | Admitting: *Deleted

## 2021-08-15 VITALS — BP 163/62 | HR 64 | Resp 20 | Ht 63.0 in | Wt 130.0 lb

## 2021-08-15 DIAGNOSIS — I350 Nonrheumatic aortic (valve) stenosis: Secondary | ICD-10-CM

## 2021-08-15 DIAGNOSIS — I7121 Aneurysm of the ascending aorta, without rupture: Secondary | ICD-10-CM

## 2021-08-15 DIAGNOSIS — R142 Eructation: Secondary | ICD-10-CM

## 2021-08-15 DIAGNOSIS — K869 Disease of pancreas, unspecified: Secondary | ICD-10-CM

## 2021-08-15 DIAGNOSIS — R140 Abdominal distension (gaseous): Secondary | ICD-10-CM

## 2021-08-15 NOTE — Progress Notes (Signed)
PCP is Wile, Jesse Sans, MD Referring Provider is Abigail Butts., PA-C  Chief Complaint  Patient presents with   Aortic Stenosis   Thoracic Aortic Aneurysm    Surgical consult, ECHO 08/01/21, CTA Chest 03/30/21    HPI: Erika Wu is sent for consultation regarding an ascending aortic aneurysm and abnormal aortic valve.  Erika Wu is a 75 year old woman with a past medical history significant for longstanding heart murmur, hypertension, hyperlipidemia, aortic insufficiency, aortic stenosis, ascending aortic aneurysm, reflux, anxiety, and depression.  She is a patient of Dr. Doug Sou.  She recently saw Roby Lofts in the cardiology office.  She complained of sensation of palpitations and chest tightness when she gets up in the morning.  She also occasionally have similar symptoms when exerting herself.  She does not have any tightness with walking at a normal pace on level ground.  She is very anxious about the aneurysm.   Past Medical History:  Diagnosis Date   Allergy    Anxiety    Chest pain    a. 03/2015 Lexiscan MV: EF 59%, no ischemia/infarct.   Colon polyps    COVID-19 virus infection    09-22-2020, fully vax'd    Depression    GERD (gastroesophageal reflux disease)    Heart murmur    Hemorrhoids    Hyperlipidemia    Hypertension    Moderate aortic insufficiency    a. 03/2015 Echo: EF 55-60%, no rwma, mild LVH, Gr 1 DD, mild AS, mod AI (bicuspid AoV by report, poorly visualized on this study), Asc Ao diam 64m, triv MR.    Past Surgical History:  Procedure Laterality Date   CARDIAC CATHETERIZATION N/A 07/24/2016   Procedure: Left Heart Cath and Coronary Angiography;  Surgeon: Peter M JMartinique MD;  [facility];  Service: Cardiovascular;  Laterality: N/A;   COLONOSCOPY     POLYPECTOMY     TONSILLECTOMY AND ADENOIDECTOMY     TUBAL LIGATION      Family History  Problem Relation Age of Onset   Heart attack Father        Died @ 576  Had CABG in  his 477's   Heart disease Father    Stomach cancer Maternal Grandmother    Colon cancer Maternal Grandmother    Diverticulosis Maternal Grandfather    Stroke Paternal Grandfather    HIV Brother    Hypertension Neg Hx    Colon polyps Neg Hx    Esophageal cancer Neg Hx    Rectal cancer Neg Hx     Social History Social History   Tobacco Use   Smoking status: Former    Packs/day: 0.50    Years: 15.00    Pack years: 7.50    Types: Cigarettes   Smokeless tobacco: Never  Vaping Use   Vaping Use: Never used  Substance Use Topics   Alcohol use: Yes    Alcohol/week: 0.0 standard drinks    Comment: 2-3 per day   Drug use: No    Current Outpatient Medications  Medication Sig Dispense Refill   amLODipine (NORVASC) 2.5 MG tablet Take 1 tablet (2.5 mg total) by mouth daily. 90 tablet 3   buPROPion (WELLBUTRIN XL) 150 MG 24 hr tablet Take 450 mg by mouth every morning.  [place] mouth.     metoprolol succinate (TOPROL-XL) 25 MG 24 hr tablet Take 1 tablet (25 mg total) by mouth every morning AND 2 tablets (50 mg total)  every evening. 270 tablet 1   mirtazapine (REMERON) 30 MG tablet Take 30 mg by mouth at bedtime.     pantoprazole (PROTONIX) 40 MG tablet Take 40 mg by mouth daily. Take 2 tablets by mouth one in the morning and one in the evening.     rosuvastatin (CRESTOR) 10 MG tablet Take 1 tablet by mouth daily.     Suvorexant (BELSOMRA) 20 MG TABS Take by mouth.     traZODone (DESYREL) 50 MG tablet Take 50 mg by mouth as needed for sleep.     valsartan (DIOVAN) 80 MG tablet Take 80 mg by mouth daily. Take 0.5 tablet by mouth two times daily.     Vitamin D-Vitamin K (VITAMIN K2-VITAMIN D3) 45-2000 MCG-UNIT CAPS once a week.     simethicone (GAS-X) 80 MG chewable tablet Chew 1 tablet (80 mg total) by mouth every 6 (six) hours as needed for flatulence. (Patient not taking: Reported on 08/15/2021) 30 tablet 0   No current facility-administered medications for  this visit.    No Known Allergies  Review of Systems  Constitutional:  Positive for fatigue and unexpected weight change (Has gained 10 pounds in 3 months). Negative for activity change.  HENT:  Negative for trouble swallowing (History of esophageal dilatation) and voice change.   Respiratory:  Positive for cough, chest tightness and shortness of breath (+/-).   Cardiovascular:  Positive for palpitations. Negative for chest pain.  Gastrointestinal:  Positive for abdominal pain (Reflux).  Genitourinary:  Negative for difficulty urinating and dysuria.  Musculoskeletal:  Negative for arthralgias and joint swelling.       Leg cramps  Neurological:  Negative for dizziness, syncope and weakness.  Hematological:  Negative for adenopathy. Does not bruise/bleed easily.  Psychiatric/Behavioral:  Positive for dysphoric mood. The patient is nervous/anxious.    BP (!) 163/62   Pulse 64   Resp 20   Ht _0  (1.6 m)   Wt 130 lb (59 kg)   SpO2 97% Comment: RA  BMI 23.03 kg/m  Physical Exam Vitals reviewed.  Constitutional:      General: She is not in acute distress.    Appearance: Normal appearance.  HENT:     Head: Normocephalic and atraumatic.  Eyes:     General: No scleral icterus.    Extraocular Movements: Extraocular movements intact.  Neck:     Vascular: Carotid bruit (Versus transmitted bruits bilaterally) present.  Cardiovascular:     Rate and Rhythm: Normal rate and regular rhythm.     Heart sounds: Murmur (3/6 systolic, 2/6 diastolic) heard.  Pulmonary:     Effort: Pulmonary effort is normal. No respiratory distress.     Breath sounds: Normal breath sounds. No wheezing or rales.  Abdominal:     General: There is no distension.     Palpations: Abdomen is soft.     Tenderness: There is no abdominal tenderness.  Musculoskeletal:     Cervical back: Neck supple.     Right lower leg: No edema.     Left lower leg: No edema.  Skin:    General: Skin is warm and dry.   Neurological:     General: No focal deficit present.     Mental Status: She is alert and oriented to person, place, and time.     Cranial Nerves: No cranial nerve deficit.    Diagnostic Tests: CT ANGIOGRAPHY CHEST WITH CONTRAST   TECHNIQUE: Multidetector CT imaging of the chest was performed using the standard protocol  during bolus administration of intravenous contrast. Multiplanar CT image reconstructions and MIPs were obtained to evaluate the vascular anatomy.   CONTRAST:  149m OMNIPAQUE IOHEXOL 350 MG/ML SOLN   COMPARISON:  None.   FINDINGS: Cardiovascular: Fusiform aneurysmal dilatation of the ascending thoracic aorta, maximal diameter 4.5 cm. No associated dissection or hyperdense intramural hematoma. No mediastinal hemorrhage or hematoma. Patent 3 vessel arch anatomy with tortuosity noted. Remainder of the aorta is normal in caliber.   Central pulmonary arteries are normal in caliber and patent. No filling defects or acute pulmonary embolus appreciated.   Normal heart size. Native coronary atherosclerosis. No pericardial effusion.   Central venous structures are patent. No veno-occlusive process. Pulmonary veins are patent without anomalous pulmonary venous return.   Mediastinum/Nodes: No enlarged mediastinal, hilar, or axillary lymph nodes. Thyroid gland, trachea, and esophagus demonstrate no significant findings.   Lungs/Pleura: Scattered bibasilar subsegmental atelectasis versus scarring. No acute airspace process, collapse or consolidation. No interstitial process or edema. No significant pulmonary nodule or mass. Trachea and central airways are patent.   No pleural abnormality, effusion, or pneumothorax.   Upper Abdomen: No acute abnormality.   Musculoskeletal: No chest wall abnormality. No acute or significant osseous findings.   Review of the MIP images confirms the above findings.   IMPRESSION: 4.5 cm ascending thoracic aortic aneurysm.    Ascending thoracic aortic aneurysm. Recommend semi-annual imaging followup by CTA or MRA and referral to cardiothoracic surgery if not already obtained. This recommendation follows 2010 ACCF/AHA/AATS/ACR/ASA/SCA/SCAI/SIR/STS/SVM Guidelines for the Diagnosis and Management of Patients With Thoracic Aortic Disease. Circulation. 2010; 121:: T364-W803 Aortic aneurysm NOS (ICD10-I71.9)   No other acute intrathoracic finding.   Native coronary atherosclerosis   Aortic Atherosclerosis (ICD10-I70.0).   Aortic aneurysm NOS (ICD10-I71.9).     Electronically Signed   By: MJerilynn Mages  Shick M.D.   On: 03/31/2021 07:48  Echocardiogram 08/01/2021 IMPRESSIONS     1. Left ventricular ejection fraction, by estimation, is 60 to 65%. Left  ventricular ejection fraction by 3D volume is 65 %. The left ventricle has  normal function. The left ventricle has no regional wall motion  abnormalities. There is moderate left  ventricular hypertrophy. Left ventricular diastolic parameters are  consistent with Grade I diastolic dysfunction (impaired relaxation).   2. Right ventricular systolic function is normal. The right ventricular  size is normal. Tricuspid regurgitation signal is inadequate for assessing  PA pressure.   3. Left atrial size was severely dilated.   4. The pericardial effusion is anterior to the right ventricle.   5. The mitral valve is degenerative. Mild mitral valve regurgitation. No  evidence of mitral stenosis.   6. The aortic valve is calcified. Aortic valve regurgitation is moderate  to severe. Moderate aortic valve stenosis. Aortic regurgitation PHT  measures 492 msec. Aortic valve area, by VTI measures 1.00 cm. Aortic  valve mean gradient measures 19.0 mmHg.  Aortic valve Vmax measures 3.01 m/s.   7. Aortic dilatation noted. There is mild dilatation of the aortic root,  measuring 39 mm.   8. The inferior vena cava is normal in size with greater than 50%  respiratory variability,  suggesting right atrial pressure of 3 mmHg.   Comparison(s): EF 65%, moderate AS with severe AI, mean gradient 18 mmHg,  peak 36.830mg, Ascend Aor 3970mAI PHT 415 msec, mild MAC. Compared to  study dated 01/23/2021, The mean AVG has increased from 18 to 79m6m  Visually AI appears moderate to severe  but only mild by PHT. Echo  images are suboptimol. Consider TEE of the AV  if clinically indicated.   I personally reviewed the CT images.  There is a 4.5 cm ascending aneurysm which returns to normal size around the takeoff of the left carotid.  No significant dilatation of the sinuses of Valsalva.  Sinotubular junction about 3.8 cm.  I personally reviewed the echocardiogram images.  There is some calcification of the aortic valve with restricted leaflet motion.  There is aortic regurgitation and aortic stenosis.  Impression: Erika Wu is a 75 year old woman with a past medical history significant for longstanding heart murmur, hypertension, hyperlipidemia, aortic insufficiency, aortic stenosis, ascending aortic aneurysm, reflux, anxiety, and depression.    She has known abnormality aortic valve with moderate aortic stenosis and moderate to severe aortic insufficiency.  There really has not been any dramatic change in her echocardiogram recently.    She also has a known ascending aneurysm which measures 4.5 cm.  She has complaints of palpitations and tightness in her chest.  This sensation is not particularly exertional in nature.  In fact, it seems to be worse when she first wakes up in the morning.  It does not wake her up, but she notices it when she wakes up.  She then only occasionally has it with heavy exertion.  I do not see any urgent indication for aortic valve replacement or repair of the ascending aneurysm at the current time.  Certainly if her exercise tolerance decreases further or she develops other signs such as orthopnea or peripheral edema, then we would need to proceed.   Obviously, we do not want to wait till she develops full-blown heart failure.  Her CT for the ascending aneurysm is from July.  She is about a month away from needing a new CT anyway.  I did discuss with her that repair of the aneurysm likely would require at least a brief period of hypothermic circulatory arrest.  We also would definitely deal with both issues if we were to have to operate on 1 or the other.  I recommended that she check her blood pressure at home.  Her systolic was 027 here today.  If it is staying elevated then she needs to go to 2 tablets twice daily on her Lopressor.  She is currently taking 1 in the morning and 2 in the evening.  We did briefly discussed that tissue valve would be the appropriate option for her at her age.  Plan: I will plan to see her back in about 6 weeks with a repeat CT chest.  We will check on her symptoms at that time.  We can further discuss possible surgery at that time as well.  I spent over 45 minutes in review of records, images, and in consultation with Erika Wu today.  Melrose Nakayama, MD [facility] [phone]

## 2021-08-16 ENCOUNTER — Telehealth: Payer: Self-pay

## 2021-08-16 NOTE — Telephone Encounter (Signed)
Patient is scheduled w/ Dr. Tarri Glenn on Monday, 12-12 but her MRCP is scheduled for Sunday, 12-11 and we won't have results in time. Dr. Tarri Glenn thinks it would be better to have results for her next appointment.  LM for patient to call back to be rescheduled with Dr. Tarri Glenn - next available in January or she can be seen by an APP sooner - certainly if she is having Sx.

## 2021-08-17 ENCOUNTER — Telehealth: Payer: Self-pay

## 2021-08-17 NOTE — Telephone Encounter (Signed)
-----   Message from Thornton Park, MD sent at 08/16/2021  4:31 PM EST ----- Regarding: RE: F/U appt MRCP Might be best to get her in with an app if she is having symptoms. Otherwise, January is okay with me. ----- Message ----- From: Roetta Sessions, CMA Sent: 08/16/2021   4:30 PM EST To: Thornton Park, MD Subject: RE: F/U appt MRCP                              Is it ok if she waits until January 12th which is your first opening, or would it be OK for her to see an APP?  Thanks, Jan   ----- Message ----- From: Thornton Park, MD Sent: 08/16/2021   2:45 PM EST To: Roetta Sessions, CMA Subject: RE: F/U appt MRCP                              Good thinking! It would be best to have the MRCP results at the time of her follow-up appointment.  Thanks.  KLB ----- Message ----- From: Roetta Sessions, CMA Sent: 08/16/2021   2:17 PM EST To: Thornton Park, MD Subject: F/U appt MRCP                                  Dr. Tarri Glenn, this patient is scheduled to have her MRCP on Sunday, 12-11 at 12pm. She has a follow up appointment with you on Monday, 12-12 at 1:30pm.  Do you want to have those results before you see her? I assume you won't get them that quickly?  Thanks, Jan

## 2021-08-17 NOTE — Telephone Encounter (Signed)
LM for patient to call back to be rescheduled in January with Dr.  Tarri Glenn since her MRCP results won't be available on Monday, 12-12 or sooner with an APP if she is having any Sx.  Patient called back and rescheduled to January 19 with Dr. Tarri Glenn.

## 2021-08-20 ENCOUNTER — Ambulatory Visit (HOSPITAL_COMMUNITY)
Admission: RE | Admit: 2021-08-20 | Discharge: 2021-08-20 | Disposition: A | Discharge status: Home / Self Care | Payer: Medicare PPO | Source: Ambulatory Visit | Attending: Gastroenterology | Admitting: Gastroenterology

## 2021-08-20 DIAGNOSIS — R140 Abdominal distension (gaseous): Secondary | ICD-10-CM | POA: Insufficient documentation

## 2021-08-20 DIAGNOSIS — R142 Eructation: Secondary | ICD-10-CM | POA: Diagnosis present

## 2021-08-20 DIAGNOSIS — K869 Disease of pancreas, unspecified: Secondary | ICD-10-CM | POA: Insufficient documentation

## 2021-08-20 MED ORDER — GADOBUTROL 1 MMOL/ML IV SOLN
6.0000 mL | Freq: Once | INTRAVENOUS | Status: AC | PRN
  Administered 2021-08-20: 6 mL via INTRAVENOUS

## 2021-08-21 ENCOUNTER — Ambulatory Visit: Payer: Medicare PPO | Admitting: Gastroenterology

## 2021-08-23 ENCOUNTER — Other Ambulatory Visit: Payer: Self-pay

## 2021-08-23 DIAGNOSIS — K869 Disease of pancreas, unspecified: Secondary | ICD-10-CM

## 2021-09-14 ENCOUNTER — Other Ambulatory Visit: Payer: Self-pay

## 2021-09-14 DIAGNOSIS — K869 Disease of pancreas, unspecified: Secondary | ICD-10-CM

## 2021-09-26 ENCOUNTER — Ambulatory Visit
Admission: RE | Admit: 2021-09-26 | Discharge: 2021-09-26 | Disposition: A | Discharge status: Home / Self Care | Payer: Medicare PPO | Source: Ambulatory Visit | Attending: Thoracic Surgery (Cardiothoracic Vascular Surgery) | Admitting: Thoracic Surgery (Cardiothoracic Vascular Surgery)

## 2021-09-26 ENCOUNTER — Encounter: Payer: Self-pay | Admitting: Thoracic Surgery (Cardiothoracic Vascular Surgery)

## 2021-09-26 ENCOUNTER — Other Ambulatory Visit: Payer: Self-pay

## 2021-09-26 ENCOUNTER — Ambulatory Visit: Payer: Medicare PPO | Admitting: Thoracic Surgery (Cardiothoracic Vascular Surgery)

## 2021-09-26 VITALS — BP 179/66 | HR 59 | Resp 20 | Ht 63.0 in | Wt 132.0 lb

## 2021-09-26 DIAGNOSIS — I7121 Aneurysm of the ascending aorta, without rupture: Secondary | ICD-10-CM

## 2021-09-26 DIAGNOSIS — I351 Nonrheumatic aortic (valve) insufficiency: Secondary | ICD-10-CM | POA: Diagnosis not present

## 2021-09-26 MED ORDER — AMLODIPINE BESYLATE 5 MG PO TABS: 5.0000 mg | ORAL_TABLET | Freq: Every day | ORAL | 3 refills | Status: DC

## 2021-09-26 MED ORDER — IOPAMIDOL (ISOVUE-370) INJECTION 76%
75.0000 mL | Freq: Once | INTRAVENOUS | Status: AC | PRN
  Administered 2021-09-26: 75 mL via INTRAVENOUS

## 2021-09-26 NOTE — Progress Notes (Signed)
[address]       [place] [address]             [phone]    HPI: Erika Wu returns after her CT angio of the chest to follow-up her ascending aneurysm.  Erika Wu is a 76 year old woman with a past history of heart murmur, hypertension, hyperlipidemia, aortic insufficiency, aortic stenosis, ascending aneurysm, reflux, anxiety, and depression.  She had been having some issues with palpitations and chest tightness when she would wake up in the morning.  She was not having any exertional symptoms.  She was sent for consultation back in December.  She had a CT in July which had shown a 4.5 cm ascending aneurysm.  She now is due for a CT for 66-month follow-up of the aneurysm.  In the interim since her last visit she has been tracking her blood pressures.  The systolic is almost universally above 140.  She had a few days when it was in the 180s to 190 range.  Heart rate usually in the upper 50s to upper 60s.  She has not been having any chest pain, pressure, or tightness.  Past Medical History:  Diagnosis Date   Allergy    Anxiety    Chest pain    a. 03/2015 Lexiscan MV: EF 59%, no ischemia/infarct.   Colon polyps    COVID-19 virus infection    09-22-2020, fully vax'd    Depression    GERD (gastroesophageal reflux disease)    Heart murmur    Hemorrhoids    Hyperlipidemia    Hypertension    Moderate aortic insufficiency    a. 03/2015 Echo: EF 55-60%, no rwma, mild LVH, Gr 1 DD, mild AS, mod AI (bicuspid AoV by report, poorly visualized on this study), Asc Ao diam 40mm, triv MR.    Current Outpatient Medications  Medication Sig Dispense Refill   buPROPion (WELLBUTRIN XL) 150 MG 24 hr tablet Take 450 mg by mouth every morning.  [place] Take by mouth.     metoprolol succinate (TOPROL-XL) 25 MG 24 hr tablet Take 1 tablet (25 mg total) by mouth every morning AND 2 tablets (50 mg total) every evening. (Patient taking differently: Take 2  tablet (50 mg total) by mouth every morning AND 2 tablets (50 mg total) every evening.) 270 tablet 1   mirtazapine (REMERON) 30 MG tablet Take 30 mg by mouth at bedtime.     pantoprazole (PROTONIX) 40 MG tablet Take 40 mg by mouth daily. Take 2 tablets by mouth one in the morning and one in the evening.     rosuvastatin (CRESTOR) 10 MG tablet Take 1 tablet by mouth daily.     simethicone (GAS-X) 80 MG chewable tablet Chew 1 tablet (80 mg total) by mouth every 6 (six) hours as needed for flatulence. 30 tablet 0   Suvorexant (BELSOMRA) 20 MG TABS Take by mouth.     traZODone (DESYREL) 50 MG tablet Take 50 mg by mouth as needed for sleep.     valsartan (DIOVAN) 80 MG tablet Take 80 mg by mouth daily. Take 0.5 tablet by mouth two times daily.     Vitamin D-Vitamin K (VITAMIN K2-VITAMIN D3) 45-2000 MCG-UNIT CAPS once a week.     amLODipine (NORVASC) 5 MG tablet Take 1 tablet (5 mg total) by mouth daily. 90 tablet 3   No current facility-administered medications for this visit.    Physical Exam BP Marland Kitchen)  179/66 (BP Location: Left Arm, Patient Position: Sitting, Cuff Size: Normal)    Pulse (!) 59    Resp 20    Ht 5\' 3"  (1.6 m)    Wt 132 lb (59.9 kg)    SpO2 95% Comment: RA   BMI 23.45 kg/m  76 year old woman in no acute distress Alert and oriented x3 with no focal deficits Lungs clear bilaterally Cardiac regular rate and rhythm with 3/6 systolic and 2/6 diastolic murmur No peripheral edema  Diagnostic Tests: CT ANGIOGRAPHY CHEST WITH CONTRAST   TECHNIQUE: Multidetector CT imaging of the chest was performed using the standard protocol during bolus administration of intravenous contrast. Multiplanar CT image reconstructions and MIPs were obtained to evaluate the vascular anatomy.   RADIATION DOSE REDUCTION: This exam was performed according to the departmental dose-optimization program which includes automated exposure control, adjustment of the mA and/or kV according to patient size and/or  use of iterative reconstruction technique.   CONTRAST:  69mL ISOVUE-370 IOPAMIDOL (ISOVUE-370) INJECTION 76%   COMPARISON:  03/30/2021   FINDINGS: Cardiovascular: Aneurysmal dilatation of the ascending thoracic aorta. Presently measures 4.7 cm on coronal series 9 (remeasured on prior as 4.6 cm). Mild atherosclerosis. Heart size is normal. No pericardial effusion. There is coronary artery calcification.   Mediastinum/Nodes: No enlarged nodes. Thyroid is partially imaged and unremarkable. Esophagus is unremarkable.   Lungs/Pleura: Linear scarring/atelectasis at the lung bases. No pleural effusion.   Upper Abdomen: No acute abnormality.   Musculoskeletal: Degenerative changes of the included spine.   Review of the MIP images confirms the above findings.   IMPRESSION: Stable to slightly increased aneurysmal dilatation of the ascending thoracic aorta measuring 4.7 cm. Recommend semi-annual imaging followup by CTA or MRA and referral to cardiothoracic surgery if not already obtained. Aortic aneurysm NOS (ICD10-I71.9)     Electronically Signed   By: Macy Mis M.D.   On: 09/26/2021 15:12 I personally reviewed the CT images.  I measured the aneurysm at 4.7 cm where Dr. Posey Pronto measured it.  I get the same measurement on her CT from 6 months ago.  Impression: Erika Wu  is a 76 year old woman with a past history of heart murmur, hypertension, hyperlipidemia, aortic insufficiency, aortic stenosis, ascending aneurysm, reflux, anxiety, and depression.  She has been followed for moderate to severe AI and moderate aortic stenosis for some time.  She is also had an ascending aneurysm.  I saw her in December when she was having a lot of anxiety related to the aneurysm.  Ascending aortic aneurysm-extends into proximal arch.  Maximal diameter about 4.7 cm.  No indication for surgery.  Hypertension-blood pressure is concerning as it is almost universally above 140 and at times getting up  in the 180 or 190 range.  With her aneurysm she is at risk for dissection.  She needs better blood pressure control.  She is on Toprol 25 mg a day.  I am not comfortable increasing that due to her borderline heart rate.  She is only on 2.5 mg of Norvasc daily, so I think the first step would be to increase that to 5 mg daily  Plan: Increase amlodipine to 5 mg daily Return in 6 months with CT angiogram of chest  Melrose Nakayama, MD [facility] [phone]

## 2021-09-28 ENCOUNTER — Telehealth: Payer: Self-pay

## 2021-09-28 ENCOUNTER — Encounter: Payer: Self-pay | Admitting: Gastroenterology

## 2021-09-28 ENCOUNTER — Ambulatory Visit: Payer: Medicare PPO | Admitting: Gastroenterology

## 2021-09-28 VITALS — BP 158/64 | HR 70 | Ht 63.0 in | Wt 131.0 lb

## 2021-09-28 DIAGNOSIS — R142 Eructation: Secondary | ICD-10-CM | POA: Diagnosis not present

## 2021-09-28 DIAGNOSIS — R110 Nausea: Secondary | ICD-10-CM | POA: Diagnosis not present

## 2021-09-28 DIAGNOSIS — K869 Disease of pancreas, unspecified: Secondary | ICD-10-CM

## 2021-09-28 DIAGNOSIS — R140 Abdominal distension (gaseous): Secondary | ICD-10-CM

## 2021-09-28 NOTE — Patient Instructions (Addendum)
It was a pleasure to see you today. I am so glad that you are starting to feel better.   I did not change your medications today. Please continue to take your pantoprazole twice daily.   Continue to use the low FODMAP diet to identify foods that trigger your gas and bloating.  Marijuana and gummies are sometimes helpful for gastrointestinal symptoms but they also can cause or make gastrointestinal symptoms worse. Please listen to your body and make good decisions.   We will work to obtain the results from your breath test. I will let you know after I review the results. Please let me know if you need anything prior to that time.   I have recommended a follow-up MRI in 12/23.  If you are age 74 or older, your body mass index should be between 23-30. Your Body mass index is 23.21 kg/m. If this is out of the aforementioned range listed, please consider follow up with your Primary Care Provider. _______________________________________________________  The [facility] providers would like to encourage you to use [facility] to communicate with providers for non-urgent requests or questions.  Due to long hold times on the telephone, sending your provider a message by [facility] may be a faster and more efficient way to get a response.  Please allow 48 business hours for a response.  Please remember that this is for non-urgent requests.  _______________________________________________________  Thank you for entrusting me with your care and choosing [facility].  Dr Tarri Glenn

## 2021-09-28 NOTE — Progress Notes (Signed)
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 Seems unlikely that the pancreatic head lesion is the cause of her symptoms given the stability and normal pancreatic elastase.     PLAN: - Continue pantoprazole to 40 mg BID - Obtain results from the SIBO methane breath test - Diet changes: Avoid dairy, sugar and sugar substitutes - Follow-up MRI/MRCP 08/2022, earlier with new symptoms - Discussed lowFODMAP diet but she is concerned about the diet changes prior to the holidays and opted not to proceed at this time - EUS prior to follow-up MRI with any further weight loss - Follow-up after the MRI, earlier with new symptoms  I spent over 30 minutes, including in depth chart review, face-to-face time with the patient, coordinating care, and ordering studies and medications as appropriate, and documentation.   HPI: Returns in scheduled follow-up for gas, eructation, and bloating. She was last seen in the office 06/22/21. She has anxiety, depression, hypertension, osteoporosis and aortic insufficiency. She has a history of colonoscopy polyps with removal of a tubulovillous adenoma in  2000, a tubular adenoma in 2004, a tubular adenoma in 2015, and on her most recently colonoscopy 10/31/20 a tubular adenoma, a sessile serrated polyp, and a hyperplastic polyp. Surveillance colonoscopy recommended in 2027  She reports a lifetime history of a "nervous" stomach.  Now with at least 20 years of frequent gas, eructation, and flatus.  Over the last year she has been experiencing concurrent nausea and dysphonia.  Symptoms worse after the death of her mother in Sep 30, 2022. She reports regular bowel habits.  She will have 1 formed bowel movement in the morning followed by several smaller less formed bowel movements.  There is an ongoing sense of incomplete evacuation.  No history of manual assistance with defecation.  No blood or mucus in the stool.  Returns in follow-up. She completed SIBO breath testing but the results are not yet available. She continues to have eructation and gas. There is no nausea and diarrhea has improved. Bloating is largely gone. She is worried because her bowel movements can be dark.  Following a low-FODMAP diet. She has drastically reduced dairy, cheese, and ice cream. Trying to avoid sodas. No foods with sugar substitutes. She has gained weight since her last visit.   She drinks alcohol daily. Does not seem to be related to her symptoms although she acknowledges that it's not good for her. She has been uses Delta 8 gummies to help with insomnia. She does not deny marijuana use.    Recent labs: - November 14, 2020 show a normal CBC and CMP - 05/26/21: normal  pancreatic elastase  Abdominal imaging: - Abdominal ultrasound 2010 for abdominal pain and reflux was normal.  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 No solid mass or pancreatic ductal dilation.  - HIDA with CCK 05/22/21: patent cystic  and common bile ducts, GBEF 53% - MRI/MRCP 08/20/20:  stable small lesion in the superior aspect of the pancreatic head that may communicate with the main pancreatic duct. There is no associated pancreatic ductal dilatation. Repeat abdominal MRI with and without IV gadolinium with MRCP is recommended in 1 year to ensure the stability of this lesion. Small, stable liver cyst.  Prior Endoscopic history:: - EGD for dysphagia 01/13/2009: early peptic stricture dilated with an 18 mm Maloney with no heme - EGD for dyspepsia 11/11/2013: gastric polyps - EGD 02/03/2021 that showed an irregular Z-line, gastritis, and gastric polyps.  Esophageal biopsies confirmed reflux.  Gastric biopsies confirmed fundic gland polyps.  She had reactive gastropathy with no H. pylori.  Duodenal biopsies showed peptic duodenitis.  Treatment trials: - Omeprazole to 40 mg BID provided minimal relief - Gas-Ex provided no relief - Xifaxan resulted in one formed BM daily, symptoms returned one week after completing treatment - 14 additional days of Xifaxan improved symptoms while on therapy  Returns today in follow-up. Some improvement in symptoms with diet modification and 14 days of Xifaxan. While on Xifaxan, her symptoms were complete controlled. The bloating has resolved. Since completing Xifaxan she has developed constipation that she is successfully treating with fiber gummies with a formed bowel movement daily.  No new complaints or concerns.   Past Medical History:  Diagnosis Date   Allergy    Anxiety    Chest pain    a. 03/2015 Lexiscan MV: EF 59%, no ischemia/infarct.   Colon polyps    COVID-19 virus infection    09-22-2020, fully vax'd    Depression    GERD (gastroesophageal reflux disease)    Heart murmur    Hemorrhoids    Hyperlipidemia    Hypertension    Moderate aortic insufficiency    a. 03/2015 Echo: EF 55-60%, no rwma, mild LVH, Gr 1 DD, mild AS, mod AI (bicuspid AoV by report, poorly visualized on this  study), Asc Ao diam 63mm, triv MR.    Past Surgical History:  Procedure Laterality Date   CARDIAC CATHETERIZATION N/A 07/24/2016   Procedure: Left Heart Cath and Coronary Angiography;  Surgeon: Peter M Martinique, MD;  Location: [place] [facility];  Service: Cardiovascular;  Laterality: N/A;   COLONOSCOPY     POLYPECTOMY     TONSILLECTOMY AND ADENOIDECTOMY     TUBAL LIGATION       Allergies as of 09/28/2021   (No Known Allergies)    Family History  Problem Relation Age of Onset   Heart attack Father        Died @ 6.  Had CABG in his 64's.   Heart disease Father    Stomach cancer Maternal Grandmother    Colon cancer Maternal Grandmother    Diverticulosis Maternal Grandfather    Stroke Paternal Grandfather    HIV Brother    Hypertension Neg Hx    Colon polyps Neg Hx    Esophageal cancer Neg Hx    Rectal cancer Neg Hx      Physical Exam: General:   Alert,  well-nourished, pleasant and cooperative in NAD, hearing loss Head:  Normocephalic and atraumatic. Eyes:  Sclera clear, no icterus.   Conjunctiva pink. Abdomen:  Soft, nontender, nondistended, normal bowel sounds, no rebound or guarding. No hepatosplenomegaly.   Neurologic:  Alert and  oriented x4;  grossly nonfocal Skin:  Intact without significant lesions or rashes. Psych:  Alert and cooperative. Normal mood and affect.    Erika Wu L. Tarri Glenn, MD, MPH 09/28/2021, 2:58 PM

## 2021-09-28 NOTE — Telephone Encounter (Signed)
Park Pope, CMA called  [phone] and spoke with Estill Bamberg requesting copy of SIBO test be faxed to our office. Copy of SIBO results received and given to Dr Tarri Glenn for her review.

## 2021-10-16 ENCOUNTER — Telehealth: Payer: Self-pay | Admitting: Cardiology

## 2021-10-16 MED ORDER — VALSARTAN 80 MG PO TABS: ORAL_TABLET | ORAL | 3 refills | Status: DC

## 2021-10-16 NOTE — Telephone Encounter (Signed)
Clearly needs to do better with sodium restriction. I would increase diovan to 80 mg in the morning and 40 mg in the evening  Rachyl Wuebker Martinique MD, [facility]

## 2021-10-16 NOTE — Telephone Encounter (Signed)
Spoke to patient Dr.Jordan's advice given.Copy of low salt guidelines mailed to patient.Advised to call back if B/P continues to be elevated.

## 2021-10-16 NOTE — Telephone Encounter (Signed)
Spoke with pt who reports elevated bp over the weekend (166/66 to 205/72). At this time, her bp is 182/76, p 57. She takes valsartan 40 mg bid, metoprolol 50 mg twice daily, and amlodipine 5 mg daily. She gained 5 pounds since Thanksgiving and her clothes fit tighter. Her shoes fit the same and stated there is no swelling in her feet. Over the weekend she ate "pepperoni pizza and had wings." We discussed limiting salt/sodium intake. She has DOE. Denies chest or back pain. Please advise.

## 2021-10-16 NOTE — Telephone Encounter (Signed)
Pt c/o BP issue: STAT if pt c/o blurred vision, one-sided weakness or slurred speech  1. What are your last 5 BP readings? 205/72; 193/72; 178/77; 174/84; 166/66  2. Are you having any other symptoms (ex. Dizziness, headache, blurred vision, passed out)? SOB  3. What is your BP issue? Patient experiencing HP and wants to know what she can do to get it to come back down. Says it has come down from 205/72 to 166/66.

## 2021-10-19 ENCOUNTER — Telehealth: Payer: Self-pay | Admitting: Gastroenterology

## 2021-10-19 DIAGNOSIS — K6389 Other specified diseases of intestine: Secondary | ICD-10-CM

## 2021-10-19 MED ORDER — RIFAXIMIN 550 MG PO TABS: 550.0000 mg | ORAL_TABLET | Freq: Three times a day (TID) | ORAL | 0 refills | Status: AC

## 2021-10-19 MED ORDER — NEOMYCIN SULFATE 500 MG PO TABS: 500.0000 mg | ORAL_TABLET | Freq: Two times a day (BID) | ORAL | 0 refills | Status: AC

## 2021-10-19 NOTE — Addendum Note (Signed)
Addended by: Aleatha Borer J on: 10/19/2021 01:33 PM   Modules accepted: Orders

## 2021-10-19 NOTE — Telephone Encounter (Signed)
Called pt and informed of results and recommendations as reviewed and documented by Dr. Tarri Glenn. Verbalized acceptance and understanding. Rx's sent to pt preferred pharmacy. Pt would like to know if she will need repeat testing or f/u in office. Routing this message to Dr. Tarri Glenn. Will await her response.

## 2021-10-19 NOTE — Telephone Encounter (Signed)
Patient called to follow up on previous message seeking SIBO results.

## 2021-10-19 NOTE — Telephone Encounter (Signed)
Breath test reviewed. It shows intestinal methane overgrowth (IMO). I recommend rifaximin 550 mg three times daily with neomycin 500 mg twice daily for 14 days.    Thank you.

## 2021-10-19 NOTE — Telephone Encounter (Signed)
Called patient and let her know Dr. Tarri Glenn said we do not usually repeat a positive SIBO test. Also scheduled an office visit on 11/22/21 for F/U after antibiotic treatment

## 2021-10-19 NOTE — Telephone Encounter (Signed)
Addressed in a telephone note for documentation. Test was positive and treatment recommended.

## 2021-10-21 ENCOUNTER — Other Ambulatory Visit: Payer: Self-pay | Admitting: Medical

## 2021-10-30 ENCOUNTER — Telehealth: Payer: Self-pay | Admitting: Cardiology

## 2021-10-30 ENCOUNTER — Telehealth: Payer: Self-pay | Admitting: *Deleted

## 2021-10-30 NOTE — Telephone Encounter (Signed)
Pts daughter returning call for med recommendation.. please advise

## 2021-10-30 NOTE — Telephone Encounter (Signed)
Spoke with pt and her daughter, the blood pressures readings listed here are all from this morning. On usual the patient reports her bp runs high in the morning and then by midday it is lower. She is taking amlodipine 2.5 mg twice daily, metoprolol succ 50 mg twice daily and valsartan is 80 mg in the morning and 40 mg in the pm. She reports feeling fine. Aware will make dr Martinique aware and advised not to take her blood pressure anymore right now.

## 2021-10-30 NOTE — Telephone Encounter (Signed)
Patient daughter called back in- I advised of message from MD.  Patient will go ahead and take the Valsartan, wait recheck after an hour, and then later this evening take the amlodipine.  Patient daughter verbalized understanding.

## 2021-10-30 NOTE — Telephone Encounter (Signed)
Pt c/o BP issue: STAT if pt c/o blurred vision, one-sided weakness or slurred speech  1. What are your last 5 BP readings?  197/82 205/87  199/83 186/87 These are all 5 mins about.  153/124 earlier this morning around 830am.   2. Are you having any other symptoms (ex. Dizziness, headache, blurred vision, passed out)? Has a little bit of a headache.   3. What is your BP issue? High BP.

## 2021-10-30 NOTE — Telephone Encounter (Signed)
Patient's daughter contacted the office regarding SBP readings as high as 205. Per daughter, patient took BP this am and reported elevation to cardiologist. Daughter concerned they have not heard back from cardiologist. Advised daughter to call office again to check on update. Daughter wanting to know when it would be appropriate to seek care via ED. Advised if patient's BP remains elevated to report to the ED for evaluation. Per daughter, she is going to call cardiologist's office at this time.

## 2021-10-30 NOTE — Telephone Encounter (Signed)
I would increase valsartan to 80 mg bid. Increase amlodipine to 5 mg in pm and 2.5 mg in am  Erika Husted Martinique MD, [facility]

## 2021-11-07 ENCOUNTER — Encounter (HOSPITAL_BASED_OUTPATIENT_CLINIC_OR_DEPARTMENT_OTHER): Payer: Self-pay

## 2021-11-09 NOTE — Progress Notes (Signed)
Office Visit    Patient Name: Erika Wu Date of Encounter: 11/10/2021  Primary Care Provider:  Michael Boston, MD Primary Cardiologist:  Peter Martinique, MD  Chief Complaint    76 year old female with a history of non-obstructive CAD, moderate-severe AI/aortic stenosis, ascending aortic aneurysm, PSVT, hypertension, hyperlipidemia, GERD, anxiety and depression who presents for follow-up related to hypertension and aortic valve disease.  Past Medical History    Past Medical History:  Diagnosis Date   Allergy    Anxiety    Chest pain    a. 03/2015 Lexiscan MV: EF 59%, no ischemia/infarct.   Colon polyps    COVID-19 virus infection    09-22-2020, fully vax'd    Depression    GERD (gastroesophageal reflux disease)    Heart murmur    Hemorrhoids    Hyperlipidemia    Hypertension    Moderate aortic insufficiency    a. 03/2015 Echo: EF 55-60%, no rwma, mild LVH, Gr 1 DD, mild AS, mod AI (bicuspid AoV by report, poorly visualized on this study), Asc Ao diam 71m, triv MR.   Past Surgical History:  Procedure Laterality Date   CARDIAC CATHETERIZATION N/A 07/24/2016   Procedure: Left Heart Cath and Coronary Angiography;  Surgeon: Peter M JMartinique MD;  [facility];  Service: Cardiovascular;  Laterality: N/A;   COLONOSCOPY     POLYPECTOMY     TONSILLECTOMY AND ADENOIDECTOMY     TUBAL LIGATION      Allergies  No Known Allergies  History of Present Illness    76year old female with the above past medical history including  non-obstructive CAD, moderate-severe AI/aortic stenosis, ascending aortic aneurysm, PSVT, hypertension, hyperlipidemia, GERD, anxiety and depression.  She has a history of a mildly abnormal Myoview in November 2017. Echocardiogram in 2017 showed EF 60 to 65%, severe aortic valve regurgitation, dilated ascending aorta, 44 mm.  Cardiac catheterization in 2017 showed mild nonobstructive CAD (pLAD 20%, p-m RCA 10%). Echocardiogram in May 2022  showed EF 65 to 70%, mild LVH, G1 DD, no R WMA, severe AI, and moderate AAS with mean gradient 18 mmHg and peak gradient 36.8 mmHg.   Outpatient monitor in 06/2021 in the setting of intermittent tachypalpitations showed PSVT, longest episode lasting 2 minutes 34 seconds, metoprolol was increased.  She was last seen in the office on 07/21/2021 and reported increase in tachypalpitations, shortness of breath, and chest tightness upon waking.  Toprol was increased.  Additionally, she was referred to CT surgery for evaluation of severe AI/AS. Most recent recent echocardiogram in November 2022 showed EF 60 to 65%, right LVH, G1 DD, severe LAE, mild mitral valve regurgitation, moderate to severe aortic valve regurgitation, moderate aortic valve stenosis, mean gradient 19.0, mild dilation of aortic root measuring 39 mm.  CT chest in January 2023 showed known ascending aortic aneurysm measuring 4.7 cm. She saw CT surgery most recently on 09/26/2021 who recommended 617-monthepeat CT angio of the chest, no recommendations for surgery at this time. Her daughter contacted our office on 10/16/2021 and 10/30/2021 with reports of elevated BP.  Valsartan was increased to 80 mg bid with recommendations for sodium restriction, amlodipine was increased to 2.5 mg in the a.m. and 5 mg in the p.m.   She presents today for follow-up, her daughter is present by speaker phone with patient permission.  She and her daughter have a myriad of concerns.  Since her daughter called our office the patient continues to have elevated home BP  readings in the 150s-190s/80s-90s.  HR in the 50s and 60s.  Upon further discussion, patient has not been checking her BP after taking her medications, but first thing in the morning.  She is taking 5 mg of amlodipine twice daily rather than the 2.5 mg in the morning and 5 mg in the evening as previously recommended. She reports 3-week history of mild chest tightness in the mornings with associated shortness of  breath lasting for less than 10 minutes, associated with high BP readings.  She has been exercising 3 days a week walking, 20 to 30 minutes at a time with no complaints of chest tightness or symptoms concerning for angina. Additionally, she states she had a syncopal episode 1 month ago after taking a delta 8 gummy.  She felt flushed and then passed out.  She denies any further syncope, presyncope,  dizziness, lightheadedness, or palpitations.  She thinks that the episode was in the setting of delta 8 use.  Overall, she and her daughter are very concerned about her persistently elevated BP and are eager for her to have better BP control.  Home Medications    Current Outpatient Medications  Medication Sig Dispense Refill   amLODipine (NORVASC) 5 MG tablet Take 1 tablet (5 mg total) by mouth daily. (Patient taking differently: Take 5 mg by mouth 2 (two) times daily.) 90 tablet 3   buPROPion (WELLBUTRIN XL) 150 MG 24 hr tablet Take 450 mg by mouth every morning.  3   carvedilol (COREG) 25 MG tablet Take 1 tablet (25 mg total) by mouth 2 (two) times daily. 180 tablet 3   FIBER SELECT GUMMIES PO Take by mouth.     hydrALAZINE (APRESOLINE) 10 MG tablet Take 1 tablet (10 mg total) by mouth as needed (as needed for systolic Blood pressures greater than 170). 30 tablet 1   mirtazapine (REMERON) 30 MG tablet Take 30 mg by mouth at bedtime.     pantoprazole (PROTONIX) 40 MG tablet Take 40 mg by mouth daily. Take 2 tablets by mouth one in the morning and one in the evening.     rosuvastatin (CRESTOR) 10 MG tablet Take 1 tablet by mouth daily.     Suvorexant (BELSOMRA) 20 MG TABS Take by mouth.     valsartan (DIOVAN) 80 MG tablet Take 1 tablet( 80 mg) in am and 1/2 tablet ( 40 mg ) in pm (Patient taking differently: Take 1 tablet( 80 mg) in am and 1 tablet ( 80 mg ) in pm) 180 tablet 3   Vitamin D-Vitamin K (VITAMIN K2-VITAMIN D3) 45-2000 MCG-UNIT CAPS once a week.     No current facility-administered  medications for this visit.     Review of Systems    She denies palpitations, pnd, orthopnea, n, v, dizziness, edema, weight gain, or early satiety. All other systems reviewed and are otherwise negative except as noted above.   Physical Exam    VS:  BP (!) 170/40 (BP Location: Left Arm, Patient Position: Sitting, Cuff Size: Normal)    Pulse 61    Ht _0  (1.6 m)    Wt 129 lb (58.5 kg)    SpO2 95%    BMI 22.85 kg/m   GEN: Well nourished, well developed, in no acute distress. HEENT: normal. Neck: Supple, no JVD, carotid bruits, or masses. Cardiac: RRR, 4/6 murmur, no rubs, or gallops. No clubbing, cyanosis, edema.  Radials/DP/PT 2+ and equal bilaterally.  Respiratory:  Respirations regular and unlabored, clear to auscultation bilaterally. GI:  Soft, nontender, nondistended, BS + x 4. MS: no deformity or atrophy. Skin: warm and dry, no rash. Neuro:  Strength and sensation are intact. Psych: Normal affect.  Accessory Clinical Findings    ECG personally reviewed by me today - No EKG in office today - no acute changes.  Lab Results  Component Value Date   WBC 6.0 07/19/2016   HGB 13.9 07/19/2016   HCT 40.5 07/19/2016   MCV 95.3 07/19/2016   PLT 233 07/19/2016   Lab Results  Component Value Date   CREATININE 0.82 03/28/2021   BUN 17 03/28/2021   NA 141 03/28/2021   K 4.7 03/28/2021   CL 106 03/28/2021   CO2 21 03/28/2021   Lab Results  Component Value Date   ALT 21 07/19/2016   AST 18 07/19/2016   ALKPHOS 138 (H) 07/19/2016   BILITOT 0.6 07/19/2016   Lab Results  Component Value Date   CHOL 190 07/19/2016   HDL 86 07/19/2016   LDLCALC 89 07/19/2016   TRIG 77 07/19/2016   CHOLHDL 2.2 07/19/2016    No results found for: HGBA1C  Assessment & Plan    1. Hypertension: BP severely uncontrolled spite recent titration of medications.  We will stop metoprolol and start carvedilol 25 mg twice daily.  Will add as needed hydralazine 10 mg for SBP greater than 170.  I  advised her to monitor her BP two hours after taking her medications, following 5-10 minutes of seated rest.  Patient verbalized understanding.  Otherwise, continue current antihypertensive regimen.  We will check BMET, TSH today. Will plan for close follow-up.  If BP remains elevated, consider escalation of carvedilol (if HR permits), or addition of scheduled hydralazine.  Could also consider 24-hour BP monitor.  2. Non-obstructive CAD: Cath in 2017 showed mild nonobstructive CAD (pLAD 20%, p-m RCA 10%).  Recent CT chest showed mild atherosclerosis, coronary artery calcification.  He does report a 3-week history of mild chest tightness with shortness of breath in the mornings associated with elevated BP readings. She is exercising 3 days a week walking 20 to 30 minutes at a time at a brisk pace and denies any exertional symptoms.  Symptoms are most likely in the setting of hypertensive urgency.  If symptoms persist despite better BP control, consider possible ischemic evaluation.  Continue amlodipine, valsartan, carvedilol as below, and as needed hydralazine as below. Continue Crestor.  3. Moderate-severe AI/aortic stenosis: Most recent recent echocardiogram in November 2022 showed EF 60 to 65%, right LVH, G1 DD, severe LAE, mild mitral valve regurgitation, moderate to severe aortic valve regurgitation, moderate aortic valve stenosis, mean gradient 19.0.  Significant murmur on exam.  No obvious symptoms at this time.  She is tolerating regular exercise.  Continue to monitor for symptoms. Following with CT surgery and per Dr. Leonarda Salon most recent note, no plans for surgery at this time.  4. Ascending aortic aneurysm: CT chest in January 2023 showed known ascending aortic aneurysm measuring 4.7 cm. No indication for surgical intervention per CT surgery's most recent note. Follow-up recommended with CT surgery in 6 months with repeat CT angio of the chest per CT surgery at that time.    5. PSVT/syncope:  Outpatient monitor in 06/2021 in the setting of intermittent tachypalpitations showed PSVT, longest episode lasting 2 minutes 34 seconds.  She did have a syncopal episode 1 month ago in the setting of delta 8 gummy use (THC).  She denies any additional syncope, presyncope, dizziness, palpitations.  Denies symptoms concerning  for stroke.  Echocardiogram 6 months ago was stable as above.  If she has recurrent syncope, presyncope, or other concerning symptoms, consider repeat monitor at that time, carotid dopplers. Discussed ED precautions.  6. Hyperlipidemia: LDL was 105 in March 2022.  Monitored and managed by PCP.  He has an upcoming appointment scheduled in the next few weeks for repeat labs with PCP. Continue Crestor.   7. Disposition: Follow-up as scheduled with Dr. Martinique on 12/05/2021.  Lenna Sciara, NP 11/10/2021, 12:29 PM

## 2021-11-10 ENCOUNTER — Ambulatory Visit (HOSPITAL_BASED_OUTPATIENT_CLINIC_OR_DEPARTMENT_OTHER): Payer: Medicare PPO | Admitting: Nurse Practitioner

## 2021-11-10 ENCOUNTER — Other Ambulatory Visit: Payer: Self-pay

## 2021-11-10 ENCOUNTER — Encounter (HOSPITAL_BASED_OUTPATIENT_CLINIC_OR_DEPARTMENT_OTHER): Payer: Self-pay | Admitting: Nurse Practitioner

## 2021-11-10 VITALS — BP 170/40 | HR 61 | Ht 63.0 in | Wt 129.0 lb

## 2021-11-10 DIAGNOSIS — I351 Nonrheumatic aortic (valve) insufficiency: Secondary | ICD-10-CM | POA: Diagnosis not present

## 2021-11-10 DIAGNOSIS — E785 Hyperlipidemia, unspecified: Secondary | ICD-10-CM

## 2021-11-10 DIAGNOSIS — I10 Essential (primary) hypertension: Secondary | ICD-10-CM

## 2021-11-10 DIAGNOSIS — I350 Nonrheumatic aortic (valve) stenosis: Secondary | ICD-10-CM | POA: Diagnosis not present

## 2021-11-10 DIAGNOSIS — I471 Supraventricular tachycardia: Secondary | ICD-10-CM

## 2021-11-10 DIAGNOSIS — R55 Syncope and collapse: Secondary | ICD-10-CM

## 2021-11-10 DIAGNOSIS — I2510 Atherosclerotic heart disease of native coronary artery without angina pectoris: Secondary | ICD-10-CM | POA: Diagnosis not present

## 2021-11-10 DIAGNOSIS — I7121 Aneurysm of the ascending aorta, without rupture: Secondary | ICD-10-CM

## 2021-11-10 MED ORDER — CARVEDILOL 25 MG PO TABS: 25.0000 mg | ORAL_TABLET | Freq: Two times a day (BID) | ORAL | 3 refills | Status: DC

## 2021-11-10 MED ORDER — HYDRALAZINE HCL 10 MG PO TABS: 10.0000 mg | ORAL_TABLET | ORAL | 1 refills | Status: DC | PRN

## 2021-11-10 NOTE — Patient Instructions (Signed)
Medication Instructions:  ?Your physician has recommended you make the following change in your medication:  ? ?Stop: Metoprolol  ? ?Start: Carvedilol 25mg  twice daily  ? ?Use AS NEEDED: Hydralazine 10mg  for Systolic (top number) blood pressures greater than 170  ? ?*If you need a refill on your cardiac medications before your next appointment, please call your pharmacy* ? ? ?Lab Work: ?Your physician recommends that you return for lab work today- BMET, TSH ? ?If you have labs (blood work) drawn today and your tests are completely normal, you will receive your results only by: ?MyChart Message (if you have MyChart) OR ?A paper copy in the mail ?If you have any lab test that is abnormal or we need to change your treatment, we will call you to review the results. ? ? ?Testing/Procedures: ?None ordered today  ? ? ?Follow-Up: ?At [facility], you and your health needs are our priority.  As part of our continuing mission to provide you with exceptional heart care, we have created designated Provider Care Teams.  These Care Teams include your primary Cardiologist (physician) and Advanced Practice Providers (APPs -  Physician Assistants and Nurse Practitioners) who all work together to provide you with the care you need, when you need it. ? ?We recommend signing up for the patient portal called "MyChart".  Sign up information is provided on this After Visit Summary.  MyChart is used to connect with patients for Virtual Visits (Telemedicine).  Patients are able to view lab/test results, encounter notes, upcoming appointments, etc.  Non-urgent messages can be sent to your provider as well.   ?To learn more about what you can do with MyChart, go to [email or web address].   ? ?Your next appointment:   ?Follow up as scheduled with Dr. Martinique  ? ?Other Instructions ?Please check your blood pressure daily 2 hours after taking medication and after sitting for 5-10 minutes. Please keep a log of this and bring this and your  blood pressure cuff to the next appointment!   ?

## 2021-11-11 ENCOUNTER — Other Ambulatory Visit (HOSPITAL_BASED_OUTPATIENT_CLINIC_OR_DEPARTMENT_OTHER): Payer: Self-pay | Admitting: Nurse Practitioner

## 2021-11-11 LAB — TSH: TSH: 0.978 u[IU]/mL (ref 0.450–4.500)

## 2021-11-11 LAB — BASIC METABOLIC PANEL
BUN/Creatinine Ratio: 16 (ref 12–28)
BUN: 12 mg/dL (ref 8–27)
CO2: 20 mmol/L (ref 20–29)
Calcium: 9.1 mg/dL (ref 8.7–10.3)
Chloride: 105 mmol/L (ref 96–106)
Creatinine, Ser: 0.77 mg/dL (ref 0.57–1.00)
Glucose: 79 mg/dL (ref 70–99)
Potassium: 4.5 mmol/L (ref 3.5–5.2)
Sodium: 141 mmol/L (ref 134–144)
eGFR: 81 mL/min/{1.73_m2} (ref >59)

## 2021-11-22 ENCOUNTER — Encounter: Payer: Self-pay | Admitting: Gastroenterology

## 2021-11-22 ENCOUNTER — Ambulatory Visit: Payer: Medicare PPO | Admitting: Gastroenterology

## 2021-11-22 VITALS — BP 128/60 | HR 62 | Ht 63.0 in | Wt 129.0 lb

## 2021-11-22 DIAGNOSIS — K6389 Other specified diseases of intestine: Secondary | ICD-10-CM | POA: Diagnosis not present

## 2021-11-22 NOTE — Progress Notes (Signed)
? ?Referring Provider: Michael Boston, MD ?Primary Care Physician:  Michael Boston, MD ? ?Chief complaint:  Gas, eructation, and bloating ? ? ?IMPRESSION:  ?Intestinal methane overgrowth presenting with gas, eructation, bloating, and nausea ?   - symptoms persist despite daily proton pump inhibitor therapy ?   - temporally worsened at time of mother's death ?   - symptoms improve while on Xifaxan, return within 1 week of rx dicontinuation ?   - SIBO breath test + methane 10/2021 ?   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   - normal pancreatic elastase ?   - stable on serial imaging ?   - normal MRI/MRCP due 08/2022 ?Reflux and reactive gastropathy on EGD 02/03/21 ?Recent weight loss following the death of her 8 year old mother, resolved ?History of colon polyps ?   - TVA 2000, TA 2004, TA 2015, TA and SSP 10/31/20 ?   - surveillance colonoscopy recommended in 2027 ?History of esophageal stricture s/p dilation 2010 ? ?Recent breath test shows intestinal methane overgrowth. Symptoms improved with rifaximin 550 mg three times daily with neomycin 500 mg twice daily for 14 days. Discussed dietary strategies to minimize recurrence. She declined referral to Nutrition.  ? 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 Due surveillance 12/23.  ? ? ?PLAN: ?- Continue pantoprazole to 40 mg BID with plans to taper if symptoms remain well controlled ?- Diet changes: Avoid dairy, sugar and sugar substitutes ?- Follow-up MRI/MRCP 08/2022, earlier with new symptoms ?- Discussed lowFODMAP diet and low fermentation diet ?- EUS prior to follow-up MRI with any further weight loss ?- Follow-up after the MRI, earlier with new symptoms ? ?I spent over 30 minutes, including in depth chart review, face-to-face time with the patient, coordinating care, and ordering studies and medications as appropriate, and documentation.  ? ?HPI: Returns in scheduled follow-up for gas, eructation, and  bloating. She was last seen in the office 09/28/21. She has anxiety, depression, hypertension, osteoporosis and aortic insufficiency. She has a history of colonoscopy polyps with removal of a tubulovillous adenoma in 2000, a tubular adenoma in 2004, a tubular adenoma in 2015, and on her most recently colonoscopy 10/31/20 a tubular adenoma, a sessile serrated polyp, and a hyperplastic polyp. Surveillance colonoscopy recommended in 2027 ? ?She reports a lifetime history of a "nervous" stomach.  Now with at least 20 years of frequent gas, eructation, and flatus.  Over the last year she has been experiencing concurrent nausea and dysphonia.  Symptoms worse after the death of her mother in October 14, 2022. She reports regular bowel habits.  She will have 1 formed bowel movement in the morning followed by several smaller less formed bowel movements.  There is an ongoing sense of incomplete evacuation.  No history of manual assistance with defecation.  No blood or mucus in the stool. ? ?Returns in follow-up. She completed SIBO breath testing but the results are not yet available. She continues to have eructation and gas. There is no nausea and diarrhea has improved. Bloating is largely gone. She is worried because her bowel movements can be dark.  Following a low-FODMAP diet. She has drastically reduced dairy, cheese, and ice cream. Trying to avoid sodas. No foods with sugar substitutes. She has gained weight since her last visit.  ? ?She drinks alcohol daily. Does not seem to be related to her symptoms although she acknowledges that it's not good for her. She  has been uses Delta 8 gummies to help with insomnia. She does not deny marijuana use.   ? ?At time of follow-up 09/28/21 she noted some improvement in symptoms with diet modification and 14 days of Xifaxan. While on Xifaxan, her symptoms were complete controlled. The bloating has resolved. Since completing Xifaxan she has developed constipation that she is successfully treating  with fiber gummies with a formed bowel movement daily.  No new complaints or concerns.  ? ?Returns today in follow-up after completing 14 days of Xifaxan and neomycin. No longer nauseated in the morning. Has eructation and flatus. However, her bowel movements are overall more regular. She was worried about having cancer. . No longer nauseated in the morning. Has eructation and flatus. However, her bowel movements are overall more regular. She was worried about having cancer and finds the symptoms are better without having to worry about cancer.  ? ?Recent labs: ?- November 14, 2020 show a normal CBC and CMP ?- 05/26/21: normal pancreatic elastase ? ?Abdominal imaging: ?- Abdominal ultrasound 2010 for abdominal pain and reflux was normal.  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 No solid mass or pancreatic ductal dilation.  ?- HIDA with CCK 05/22/21: patent cystic and common bile ducts, GBEF 53% ?- MRI/MRCP 08/20/20:  stable small lesion in the superior aspect of the pancreatic head that may communicate with the main pancreatic duct. There is no associated pancreatic ductal dilatation. Repeat abdominal MRI with and without IV gadolinium with MRCP is recommended in 1 year to ensure the ?stability of this lesion. Small, stable liver cyst. ? ?Prior Endoscopic history:: ?- EGD for dysphagia 01/13/2009: early peptic stricture dilated with an 18 mm Maloney with no heme ?- EGD for dyspepsia 11/11/2013: gastric polyps ?- EGD 02/03/2021 that showed an irregular Z-line, gastritis, and gastric polyps.  Esophageal biopsies confirmed reflux.  Gastric biopsies confirmed fundic gland polyps.  She had reactive gastropathy with no H. pylori.  Duodenal biopsies showed peptic duodenitis. ? ?Treatment trials: ?- Omeprazole to 40 mg BID provided minimal  relief ?- Gas-Ex provided no relief ?- Xifaxan resulted in one formed BM daily, symptoms returned one week after completing treatment ?- 14 additional days of Xifaxan improved symptoms while on therapy ?- Resolution with 14 days of Xifaxan and neomycin ? ?Past Medical History:  ?Diagnosis Date  ? Allergy   ? Anxiety   ? Chest pain   ? a. 03/2015 Lexiscan MV: EF 59%, no ischemia/infarct.  ? Colon polyps   ? COVID-19 virus infection   ? 09-22-2020, fully vax'd   ? Depression   ? GERD (gastroesophageal reflux disease)   ? Heart murmur   ? Hemorrhoids   ? Hyperlipidemia   ? Hypertension   ? Moderate aortic insufficiency   ? a. 03/2015 Echo: EF 55-60%, no rwma, mild LVH, Gr 1 DD, mild AS, mod AI (bicuspid AoV by report, poorly visualized on this study), Asc Ao diam 68m, triv MR.  ? ? ?Past Surgical History:  ?Procedure Laterality Date  ? CARDIAC CATHETERIZATION N/A 07/24/2016  ? Procedure: Left Heart Cath and Coronary Angiography;  Surgeon: Peter M JMartinique MD;  [facility];  Service: Cardiovascular;  Laterality: N/A;  ? COLONOSCOPY    ? POLYPECTOMY    ? TONSILLECTOMY AND ADENOIDECTOMY    ? TUBAL LIGATION    ? ? ? ?  Allergies as of 11/22/2021  ? (No Known Allergies)  ? ? ? ? ?Physical Exam: ?General:   Alert,  well-nourished, pleasant and cooperative in NAD, hearing loss ?Head:  Normocephalic and atraumatic. ?Eyes:  Sclera clear, no icterus.   Conjunctiva pink. ?Abdomen:  Soft, nontender, nondistended, normal bowel sounds, no rebound or guarding. No hepatosplenomegaly.   ?Neurologic:  Alert and  oriented x4;  grossly nonfocal ?Skin:  Intact without significant lesions or rashes. ?Psych:  Alert and cooperative. Normal mood and affect. ? ? ? ?Hayla Hinger L. Tarri Glenn, MD, MPH ?11/22/2021, 3:30 PM ? ? ? ? ?

## 2021-11-22 NOTE — Patient Instructions (Addendum)
I am so glad that you are feeling better.  We may need to consider repeating the antibiotics again if your symptoms recur.  ? ?We should also consider adjusting your diet to try to control symptoms. A low-FODMAP diet is the best studied. An alternative is a low fermentation diet that allows you to continue to eat easily digested starches and sugars.  ? ?WHAT IS THE LOW FERMENTATION DIET? ?The low fermentation diet has been designed to reduce symptoms of bacterial overgrowth through changes in meal timings and avoidance of fermentable foods. ? ?Foods which are low in fermentable carbohydrates are easy for our gut to digest and do not create excess gas. In contrast, when we eat ?fermentable carbohydrates,? bacteria breaks them down in a process known as fermentation, producing excess gas. ? ?Below are some examples of foods allowed and should be avoided on the diet . ?FOODS ALLOWED ON THE LOW FERMENTATION DIET ?Sweet potato ?Rice ?Regular potatoes ?Healthy fats ?Animal proteins ?Most fruits ?Some vegetables ?Protein powders (including vegan proteins) ? ?FOODS NOT ALLOWED ON THE LOW FERMENTATION DIET; ?Beans and pulses ?Oats ?Some dairy ?Gums ?Added sweeteners ?Fiber supplements  ? ?MEAL PATTERNS ON THE LOW FERMENTATION DIET ?On the diet, it is advised that you at least a 4 hour gap between each time you eat and that you should not eat close to bedtime. This is because the gut has a ?cleaning process? called the migrating motor complex or [place]. The is a type of nervous system reaction that sweeps your gut every so often to clean it out but it does not work if you are eating. In theory, if you are a snacker and eat every hour of the day, this process does not work and could lead to or worsen bacterial overgrowth. ? ?THE RESEARCH ON THE LOW FERMENTATION DIET ?There is no research on this specific diet protocol yet but the elements of the diet are theories based on other areas of similar research. ? ?I would be happy to  refer you to a nutritionist at any time.  ? ?As we discussed during your last visit, we will plan another MRI in December. Please let me know if you need anything else prior to that time.  ? ?

## 2021-11-30 NOTE — Progress Notes (Signed)
? ?Cardiology Office Note   ? ?Date:  12/05/2021  ? ?ID:  Erika Wu, DOB 05-31-1946, MRN [id number] ? ?PCP:  Melida Quitter, MD  ?Cardiologist:  Fredrick Dray [place], MD  ? ? ?History of Present Illness:  ?Erika Wu is a 76 y.o. 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   She is a former patient of Dr. Patty Sermons. She also has a history of moderate aortic stenosis. She had a normal Myoview study in July 2016. She also had an Echo at that time showing Aortic root enlargement with moderate  AI. This was stable from prior exams. Seen in October 2017 with symptoms of chest pain and palpitations. Echo reported severe AI with dilated aorta of 4.4 cm. EF and LV dimensions were normal. Myoview study was abnormal with evidence of apical ischemia. This led to a cardiac cath in November 2017 showing minimal nonobstructive CAD. Normal LV function and normal LVEDP. She also wore an event monitor that was normal with no arrhythmia.  ? ?Echo in March 2021 showed normal EF with moderate AI.  ? ?Echocardiogram obtained on 01/23/2021 showed EF 65 to 70%, grade 1 DD, mild LVH, moderate to severe AI with moderate aortic stenosis, borderline dilated ascending aorta measuring at 39 mm.  CTA of the chest obtained on 03/30/2021 showed a  thoracic aortic aneurysm measuring at 4.5 cm.   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     HIDA scan obtained on 05/22/2021 was normal. ? ?She was seen in September with complaints of intermittent palpitations. She wore an event monitor which showed some minor NSVT. She did have some episodes of SVT longest lasting 3 minutes. Metoprolol dose was increased. When seen in November symptoms were improved. She is followed by Dr Dorris Fetch for her thoracic aneurysm. Last measured at 4.7 cm. Echo repeated in  November 2022 and AI was unchanged.  ? ?She was seen earlier in March with concerns of persistent HTN. Metoprolol was switched to Coreg 25 mg bid and continued on amlodipine and valsartan. Given hydralazine to take PRN. ? ?On follow up today she is seen with her daughter. She brings a list of her BP readings. Since her medication was changed she has noted a significant improvement this week. Typically BP 120-148 but with occasional reading to 158. She does note a little chest tightness in the am but usually waits 2 hours after breakfast to take her meds. She has infrequent tachypalpitations.  ? ? ?Past Medical History:  ?Diagnosis Date  ? Allergy   ? Anxiety   ? Chest pain   ? a. 03/2015 Lexiscan MV: EF 59%, no ischemia/infarct.  ? Colon polyps   ? COVID-19 virus infection   ? 09-22-2020, fully vax'd   ? Depression   ? GERD (gastroesophageal reflux disease)   ? Heart murmur   ? Hemorrhoids   ? Hyperlipidemia   ? Hypertension   ? Moderate aortic insufficiency   ? a. 03/2015 Echo: EF 55-60%, no rwma, mild LVH, Gr 1 DD, mild AS, mod AI (bicuspid AoV by report, poorly visualized on this study), Asc Ao diam 44mm, triv MR.  ? ? ?Past Surgical History:  ?Procedure Laterality Date  ? CARDIAC CATHETERIZATION N/A 07/24/2016  ? Procedure: Left Heart Cath and Coronary Angiography;  Surgeon: Stevey Stapleton M [place], MD;  Location: [facility];  Service: Cardiovascular;  Laterality: N/A;  ? COLONOSCOPY    ? POLYPECTOMY    ? TONSILLECTOMY AND ADENOIDECTOMY    ? TUBAL LIGATION    ? ? ?Current Medications: ?Outpatient Medications Prior to Visit  ?Medication Sig Dispense Refill  ? buPROPion (WELLBUTRIN XL) 150 MG 24 hr tablet Take 450 mg by mouth every morning.  3  ? carvedilol (COREG) 25 MG tablet Take 1 tablet (25 mg total) by mouth 2 (two) times daily. 180 tablet 3  ? FIBER SELECT GUMMIES PO Take by mouth.    ? hydrALAZINE (APRESOLINE) 10 MG tablet Take 1 tablet (10 mg total) by mouth as needed (as needed for systolic Blood pressures  greater than 170). 30 tablet 1  ? mirtazapine (REMERON) 30 MG tablet Take 30 mg by mouth at bedtime.    ? pantoprazole (PROTONIX) 40 MG tablet Take 40 mg by mouth daily. Take 2 tablets by mouth one in the morning and one in the evening.    ? rosuvastatin (CRESTOR) 10 MG tablet Take 1 tablet by mouth daily.    ? Suvorexant (BELSOMRA) 20 MG TABS Take by mouth.    ? Vitamin D-Vitamin K (VITAMIN K2-VITAMIN D3) 45-2000 MCG-UNIT CAPS once a week.    ? amLODipine (NORVASC) 5 MG tablet Take 1 tablet (5 mg total) by mouth daily. (Patient taking differently: Take 5 mg by mouth 2 (two) times daily.) 90 tablet 3  ? valsartan (DIOVAN) 80 MG tablet Take 1 tablet( 80 mg) in am and 1/2 tablet ( 40 mg ) in pm (Patient taking differently: Take 1 tablet( 80 mg) in am and 1 tablet ( 80 mg ) in pm) 180 tablet 3  ? ?No facility-administered medications prior to visit.  ?  ? ?Allergies:   Patient has no known allergies.  ? ?Social History  ? ?Socioeconomic History  ? Marital status: Legally Separated  ?  Spouse name: Not on file  ? Number of children: 2  ? Years of education: Not on file  ? Highest education level: Not on file  ?Occupational History  ? Occupation: retired Runner, broadcasting/film/video  ?  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  ?Tobacco Use  ? Smoking status: Former  ?  Packs/day: 0.50  ?  Years: 15.00  ?  Pack years: 7.50  ?  Types: Cigarettes  ? Smokeless tobacco: Never  ?Vaping Use  ? Vaping Use: Never used  ?Substance and Sexual Activity  ? Alcohol use: Yes  ?  Alcohol/week: 0.0 standard drinks  ?  Comment: 2-3 per day  ? Drug use: No  ? Sexual activity: Not on file  ?Other Topics Concern  ? Not on file  ?Social History Narrative  ? Not on file  ? ?Social Determinants of Health  ? ?Financial Resource Strain: Not on file  ?Food Insecurity: Not on file  ?Transportation Needs: Not on file  ?Physical Activity: Not on file  ?Stress: Not on file  ?Social Connections: Not on file  ?  ? ?Family History:  The patient's family history  includes Colon cancer in her maternal grandmother; Diverticulosis in her maternal grandfather; HIV in her brother; Heart attack in her father; Heart disease in her father; Stomach cancer in her maternal grandmother; Stroke in her paternal grandfather.  ? ?ROS:   ?Please see the history of present illness.    ?ROS All other systems reviewed and are negative. ? ? ?PHYSICAL EXAM:   ?VS:  BP (!) 130/50 (BP Location:  Left Arm)   Pulse (!) 58   Ht 5\' 3"  (1.6 m)   Wt 130 lb 9.6 oz (59.2 kg)   SpO2 95%   BMI 23.13 kg/m?    ?GEN: Well nourished, well developed, in no acute distress  ?HEENT: normal  ?Neck: no JVD, carotid bruits, or masses ?Cardiac: RRR; there is a gr 2/6 systolic ejection murmur RUSB with a blowing 3/6 diastolic murmur, no edema  ?Respiratory:  clear to auscultation bilaterally, normal work of breathing ?GI: soft, nontender, nondistended, + BS ?MS: no deformity or atrophy  ?Skin: warm and dry, no rash ?Neuro:  Alert and Oriented x 3, Strength and sensation are intact ?Psych: euthymic mood, full affect ? ?Wt Readings from Last 3 Encounters:  ?12/05/21 130 lb 9.6 oz (59.2 kg)  ?11/22/21 129 lb (58.5 kg)  ?11/10/21 129 lb (58.5 kg)  ?  ? ? ?Studies/Labs Reviewed:  ? ?EKG:  EKG is not ordered today.   ? ? ?Recent Labs: ?11/10/2021: BUN 12; Creatinine, Ser 0.77; Potassium 4.5; Sodium 141; TSH 0.978  ? ?Lipid Panel ?   ?Component Value Date/Time  ? CHOL 190 07/19/2016 1050  ? TRIG 77 07/19/2016 1050  ? HDL 86 07/19/2016 1050  ? CHOLHDL 2.2 07/19/2016 1050  ? VLDL 15 07/19/2016 1050  ? LDLCALC 89 07/19/2016 1050  ?Dated 04/14/20: normal CBC ?Dated 11/14/20: cholesterol 198, triglycerides 80, HDL 77, LDL 105. CMET and TSH normal ?Dated 11/16/21: cholesterol 142, triglycerides 61, HDL 60, LDL 70. CMET and TSH normal ? ? ?Additional studies/ records that were reviewed today include:  ?Myoview 07/13/16:Study Highlights  ? ?Nuclear stress EF: 60%. ?The left ventricular ejection fraction is normal (55-65%). ?There was no ST  segment deviation noted during stress. ?There is a small defect of mild severity present in the apical lateral and apical location. The defect is reversible and is consistent with a small area of ischemia although sh

## 2021-12-05 ENCOUNTER — Encounter: Payer: Self-pay | Admitting: Cardiology

## 2021-12-05 ENCOUNTER — Other Ambulatory Visit: Payer: Self-pay

## 2021-12-05 ENCOUNTER — Ambulatory Visit: Payer: Medicare PPO | Admitting: Cardiology

## 2021-12-05 VITALS — BP 130/50 | HR 58 | Ht 63.0 in | Wt 130.6 lb

## 2021-12-05 DIAGNOSIS — I471 Supraventricular tachycardia: Secondary | ICD-10-CM | POA: Diagnosis not present

## 2021-12-05 DIAGNOSIS — I351 Nonrheumatic aortic (valve) insufficiency: Secondary | ICD-10-CM | POA: Diagnosis not present

## 2021-12-05 DIAGNOSIS — I7121 Aneurysm of the ascending aorta, without rupture: Secondary | ICD-10-CM | POA: Diagnosis not present

## 2021-12-05 DIAGNOSIS — I10 Essential (primary) hypertension: Secondary | ICD-10-CM | POA: Diagnosis not present

## 2021-12-05 MED ORDER — VALSARTAN 80 MG PO TABS: 80.0000 mg | ORAL_TABLET | Freq: Two times a day (BID) | ORAL | 3 refills | Status: DC

## 2021-12-05 MED ORDER — AMLODIPINE BESYLATE 5 MG PO TABS: 5.0000 mg | ORAL_TABLET | Freq: Every day | ORAL | 3 refills | Status: DC

## 2021-12-05 NOTE — Patient Instructions (Addendum)
Continue Coreg 25 mg twice a day and amlodipine 5 mg daily ? ?Increase valsartan to 80 mg twice a day ? ?We will arrange follow up Echo in July and follow up after. ? ?You will also get a follow up CT in July per Dr Roxan Hockey ? ? ?

## 2021-12-06 ENCOUNTER — Telehealth: Payer: Self-pay | Admitting: Cardiology

## 2021-12-06 NOTE — Telephone Encounter (Signed)
Pt advised that I spoke with [facility] and her Amlodipine is too early to be filled based on her insurance... she can fill it 12/10/21. She was taking 2 for a short time due to an error... so she is now out of it and needs to fill it.  ? ?According to the Pharmacist... her insurance will not pay until 12/10/21 but she can use a discount care to get her through until then.. pt advised and will talk with the pharmacist.  ?

## 2021-12-06 NOTE — Telephone Encounter (Signed)
Pt c/o medication issue: ? ?1. Name of Medication:  amLODipine (NORVASC) 5 MG tablet ? ?2. How are you currently taking this medication (dosage and times per day)? Patient states she is taking 5 mg twice daily ? ?3. Are you having a reaction (difficulty breathing--STAT)? no ? ?4. What is your medication issue?  Pharmacy does not have updated rx. She is out of medication but based on the previous dosage instructions it is too soon to fill the rx. ? ?She forgot to mention it at her appointment with Dr. Martinique yesterday ?*STAT* If patient is at the pharmacy, call can be transferred to refill team. ? ? ?1. Which medications need to be refilled? (please list name of each medication and dose if known) amLODipine (NORVASC) 5 MG tablet ? ?2. Which pharmacy/location (including street and city if local pharmacy) is medication to be sent to? ?[id number] - [place], [place] ? ?3. Do they need a 30 day or 90 day supply? 90 with refills ? ? ?

## 2022-01-25 ENCOUNTER — Telehealth: Payer: Self-pay | Admitting: Cardiology

## 2022-01-25 MED ORDER — VALSARTAN 80 MG PO TABS: 80.0000 mg | ORAL_TABLET | Freq: Two times a day (BID) | ORAL | 3 refills | Status: DC

## 2022-01-25 NOTE — Telephone Encounter (Signed)
*  STAT* If patient is at the pharmacy, call can be transferred to refill team.   1. Which medications need to be refilled? (please list name of each medication and dose if known) valsartan (DIOVAN) 80 MG tablet  2. Which pharmacy/location (including street and city if local pharmacy) is medication to be sent to? [place], [place]  3. Do they need a 30 day or 90 day supply? 90 day

## 2022-01-25 NOTE — Telephone Encounter (Signed)
Medication has been filled 

## 2022-02-21 NOTE — Addendum Note (Signed)
Addended by: Beatrix Fetters on: 02/21/2022 04:56 PM   Modules accepted: Orders

## 2022-02-23 ENCOUNTER — Other Ambulatory Visit: Payer: Self-pay | Admitting: Internal Medicine

## 2022-02-23 DIAGNOSIS — Z1231 Encounter for screening mammogram for malignant neoplasm of breast: Secondary | ICD-10-CM

## 2022-02-27 ENCOUNTER — Ambulatory Visit
Admission: RE | Admit: 2022-02-27 | Discharge: 2022-02-27 | Disposition: A | Discharge status: Home / Self Care | Payer: Medicare PPO | Source: Ambulatory Visit | Attending: Internal Medicine | Admitting: Internal Medicine

## 2022-02-27 DIAGNOSIS — Z1231 Encounter for screening mammogram for malignant neoplasm of breast: Secondary | ICD-10-CM

## 2022-03-06 ENCOUNTER — Other Ambulatory Visit: Payer: Self-pay

## 2022-03-06 DIAGNOSIS — I712 Thoracic aortic aneurysm, without rupture, unspecified: Secondary | ICD-10-CM

## 2022-03-07 ENCOUNTER — Ambulatory Visit (HOSPITAL_COMMUNITY): Payer: Medicare PPO | Attending: Cardiology

## 2022-03-07 ENCOUNTER — Telehealth (HOSPITAL_COMMUNITY): Payer: Self-pay | Admitting: Radiology

## 2022-03-07 DIAGNOSIS — I10 Essential (primary) hypertension: Secondary | ICD-10-CM | POA: Diagnosis present

## 2022-03-07 DIAGNOSIS — I471 Supraventricular tachycardia: Secondary | ICD-10-CM | POA: Diagnosis present

## 2022-03-07 DIAGNOSIS — I351 Nonrheumatic aortic (valve) insufficiency: Secondary | ICD-10-CM | POA: Diagnosis present

## 2022-03-07 DIAGNOSIS — I7121 Aneurysm of the ascending aorta, without rupture: Secondary | ICD-10-CM | POA: Diagnosis present

## 2022-03-07 LAB — BASIC METABOLIC PANEL
BUN/Creatinine Ratio: 18 (ref 12–28)
BUN: 14 mg/dL (ref 8–27)
CO2: 21 mmol/L (ref 20–29)
Calcium: 8.8 mg/dL (ref 8.7–10.3)
Chloride: 107 mmol/L — ABNORMAL HIGH (ref 96–106)
Creatinine, Ser: 0.79 mg/dL (ref 0.57–1.00)
Glucose: 105 mg/dL — ABNORMAL HIGH (ref 70–99)
Potassium: 4.4 mmol/L (ref 3.5–5.2)
Sodium: 142 mmol/L (ref 134–144)
eGFR: 78 mL/min/{1.73_m2} (ref >59)

## 2022-03-07 NOTE — Telephone Encounter (Signed)
Left message on answering machine. Per echo reader's request (Dr. Radford Pax) we need to bring the patient back for additional imaging at no charge. Please speak with Edmon Crape or Lenard Forth before scheduling.

## 2022-03-08 ENCOUNTER — Telehealth: Payer: Self-pay | Admitting: Cardiology

## 2022-03-08 ENCOUNTER — Ambulatory Visit (HOSPITAL_COMMUNITY): Payer: Medicare PPO | Attending: Cardiovascular Disease

## 2022-03-08 LAB — ECHOCARDIOGRAM COMPLETE
AR max vel: 1.05 cm2
AV Area VTI: 1.29 cm2
AV Area mean vel: 1.22 cm2
AV Mean grad: 28 mmHg
AV Peak grad: 58.7 mmHg
Ao pk vel: 3.83 m/s
Area-P 1/2: 2.43 cm2
P 1/2 time: 201 msec
S' Lateral: 3 cm

## 2022-03-08 NOTE — Telephone Encounter (Signed)
Pt returning a call from Radiology,  Lenard Galloway about echo and additional testing

## 2022-03-09 ENCOUNTER — Other Ambulatory Visit: Payer: Self-pay

## 2022-03-09 DIAGNOSIS — I351 Nonrheumatic aortic (valve) insufficiency: Secondary | ICD-10-CM

## 2022-03-12 ENCOUNTER — Ambulatory Visit (HOSPITAL_BASED_OUTPATIENT_CLINIC_OR_DEPARTMENT_OTHER)
Admission: RE | Admit: 2022-03-12 | Discharge: 2022-03-12 | Disposition: A | Discharge status: Home / Self Care | Payer: Medicare PPO | Source: Ambulatory Visit | Attending: Cardiology | Admitting: Cardiology

## 2022-03-12 ENCOUNTER — Encounter (HOSPITAL_BASED_OUTPATIENT_CLINIC_OR_DEPARTMENT_OTHER): Payer: Self-pay

## 2022-03-12 DIAGNOSIS — I712 Thoracic aortic aneurysm, without rupture, unspecified: Secondary | ICD-10-CM | POA: Insufficient documentation

## 2022-03-12 MED ORDER — IOHEXOL 350 MG/ML SOLN
75.0000 mL | Freq: Once | INTRAVENOUS | Status: AC | PRN
  Administered 2022-03-12: 75 mL via INTRAVENOUS

## 2022-03-16 ENCOUNTER — Telehealth: Payer: Self-pay | Admitting: Cardiology

## 2022-03-16 NOTE — Telephone Encounter (Signed)
Patient called - she was notified of results by [facility] LPN of echo results.  CT test results pending  Advised will call once MD reviews  She has a visit on 03/28/22

## 2022-03-16 NOTE — Telephone Encounter (Signed)
Pt calling to f/u on Echo and CT test results. Please advise

## 2022-03-21 ENCOUNTER — Telehealth: Payer: Self-pay | Admitting: Gastroenterology

## 2022-03-21 ENCOUNTER — Other Ambulatory Visit: Payer: Self-pay

## 2022-03-21 DIAGNOSIS — R197 Diarrhea, unspecified: Secondary | ICD-10-CM

## 2022-03-21 NOTE — Telephone Encounter (Signed)
Patient called in with complaints of occasional, moderate amount of bright red blood in her stools & diarrhea for the last week. She has frequent episodes of diarrhea in the morning. She does have a history of hemorrhoids, but has no complaints of any pain/swelling. Denies n/v, or abdominal pain. She was last seen for OV with Dr. Tarri Glenn on 11/22/21. Patient advised to avoid spicy/greasy foods, stick with a bland diet, and increase fluids in the meantime. Will route to MD for further recommendations.

## 2022-03-21 NOTE — Telephone Encounter (Signed)
Inbound call from patient stating that she has blood in her stool and is requesting a call back to discuss. Please advise.

## 2022-03-22 ENCOUNTER — Other Ambulatory Visit: Payer: Self-pay

## 2022-03-22 DIAGNOSIS — R197 Diarrhea, unspecified: Secondary | ICD-10-CM

## 2022-03-22 DIAGNOSIS — K625 Hemorrhage of anus and rectum: Secondary | ICD-10-CM

## 2022-03-22 MED ORDER — HYDROCORTISONE ACETATE 25 MG RE SUPP: RECTAL | 1 refills | Status: DC

## 2022-03-22 NOTE — Telephone Encounter (Signed)
Spoke with patient regarding MD recommendations. Prescription sent to pharmacy. Patient has been advised to keep follow up scheduled for 04/17/22 with Estill Bamberg, [place].

## 2022-03-22 NOTE — Telephone Encounter (Signed)
Anusol HC supp 1 PR qd x 7 days then qd prn, #14, 1 refill  Since Dr. Tarri Glenn recommended this prescription please place this under her name

## 2022-03-25 NOTE — Progress Notes (Unsigned)
Cardiology Office Note    Date:  03/28/2022   ID:  Trella, Mahannah Jul 31, 1946, MRN [id number]  PCP:  Melida Quitter, MD  Cardiologist:  Armari Fussell [place], MD    History of Present Illness:  Erika Wu is a 76 y.o. female seen for follow up  of chronic AI.   She is a former patient of Dr. Patty Sermons. She also has a history of moderate aortic stenosis. She had a normal Myoview study in July 2016. She also had an Echo at that time showing Aortic root enlargement with moderate  AI. This was stable from prior exams. Seen in October 2017 with symptoms of chest pain and palpitations. Echo reported severe AI with dilated aorta of 4.4 cm. EF and LV dimensions were normal. Myoview study was abnormal with evidence of apical ischemia. This led to a cardiac cath in November 2017 showing minimal nonobstructive CAD. Normal LV function and normal LVEDP. She also wore an event monitor that was normal with no arrhythmia.   Echo in March 2021 showed normal EF with moderate AI.   Echocardiogram obtained on 01/23/2021 showed EF 65 to 70%, grade 1 DD, mild LVH, moderate to severe AI with moderate aortic stenosis, borderline dilated ascending aorta measuring at 39 mm.  CTA of the chest obtained on 03/30/2021 showed a  thoracic aortic aneurysm measuring at 4.5 cm.   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     HIDA scan obtained on 05/22/2021 was normal.  She was seen in September with complaints of intermittent palpitations. She wore an event monitor which showed some minor NSVT. She did have some episodes of SVT longest lasting 3 minutes. Metoprolol dose was increased. When seen in November symptoms were improved. She is followed by Dr Dorris Fetch for her thoracic aneurysm. Last measured at 4.7 cm. Echo repeated in  November 2022 and AI was unchanged.   She was seen earlier in March with concerns of persistent HTN. Metoprolol was switched to Coreg 25 mg bid and continued on amlodipine and valsartan. Given hydralazine to take PRN.  On follow up today she is seen with her daughter. She reports her BP control has been very good. Typically BP 120-130 systolic. She denies any chest pain or SOB. Occasional minor palpitations. No edema. She is not walking a lot. Watches salt intake.    Past Medical History:  Diagnosis Date   Allergy    Anxiety    Chest pain    a. 03/2015 Lexiscan MV: EF 59%, no ischemia/infarct.   Colon polyps    COVID-19 virus infection    09-22-2020, fully vax'd    Depression    GERD (gastroesophageal reflux disease)    Heart murmur    Hemorrhoids    Hyperlipidemia    Hypertension    Moderate aortic insufficiency    a. 03/2015 Echo: EF 55-60%, no rwma, mild LVH, Gr 1 DD, mild AS, mod AI (bicuspid AoV by report, poorly visualized on this study), Asc Ao diam 44mm, triv MR.    Past Surgical History:  Procedure Laterality Date   CARDIAC CATHETERIZATION N/A 07/24/2016   Procedure: Left Heart Cath and Coronary Angiography;  Surgeon: Doryce Mcgregory M [place], MD;  Location: [facility] INVASIVE CV LAB;  Service: Cardiovascular;  Laterality: N/A;   COLONOSCOPY     POLYPECTOMY  TONSILLECTOMY AND ADENOIDECTOMY     TUBAL LIGATION      Current Medications: Outpatient Medications Prior to Visit  Medication Sig Dispense Refill   amLODipine (NORVASC) 5 MG tablet Take 1 tablet (5 mg total) by mouth daily. 90 tablet 3   buPROPion (WELLBUTRIN XL) 150 MG 24 hr tablet Take 450 mg by mouth every morning.  3   carvedilol (COREG) 25 MG tablet Take 1 tablet (25 mg total) by mouth 2 (two) times daily. 180 tablet 3   FIBER SELECT GUMMIES PO Take by mouth.     hydrALAZINE (APRESOLINE) 10 MG tablet Take 1 tablet (10 mg total) by mouth as needed (as needed for systolic Blood pressures greater than 170). 30 tablet 1    hydrocortisone (ANUSOL-HC) 25 MG suppository Place 1 suppository (25 mg total) rectally daily for 7 days. Then daily as needed. 14 suppository 1   mirtazapine (REMERON) 30 MG tablet Take 30 mg by mouth at bedtime.     pantoprazole (PROTONIX) 40 MG tablet TAKE 1 TABLET(40 MG) BY MOUTH TWICE DAILY 180 tablet 3   rosuvastatin (CRESTOR) 10 MG tablet Take 1 tablet by mouth daily.     Suvorexant (BELSOMRA) 20 MG TABS Take by mouth.     valsartan (DIOVAN) 80 MG tablet Take 1 tablet (80 mg total) by mouth 2 (two) times daily. 180 tablet 3   Vitamin D-Vitamin K (VITAMIN K2-VITAMIN D3) 45-2000 MCG-UNIT CAPS once a week.     No facility-administered medications prior to visit.     Allergies:   Patient has no known allergies.   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  Tobacco Use   Smoking status: Former    Packs/day: 0.50    Years: 15.00    Total pack years: 7.50    Types: Cigarettes   Smokeless tobacco: Never  Vaping Use   Vaping Use: Never used  Substance and Sexual Activity   Alcohol use: Yes    Alcohol/week: 0.0 standard drinks of alcohol    Comment: 2-3 per day   Drug use: No   Sexual activity: Not on file  Other Topics Concern   Not on file  Social History Narrative   Not on file   Social Determinants of Health   Financial Resource Strain: Not on file  Food Insecurity: Not on file  Transportation Needs: Not on file  Physical Activity: Not on file  Stress: Not on file  Social Connections: Not on file     Family History:  The patient's family history includes Colon cancer in her maternal grandmother; Diverticulosis in her maternal grandfather; HIV in her brother; Heart attack in her father; Heart disease in her  father; Stomach cancer in her maternal grandmother; Stroke in her paternal grandfather.   ROS:   Please see the history of present illness.    ROS All other systems reviewed and are negative.   PHYSICAL EXAM:   VS:  BP 138/78   Pulse (!) 54   Ht 5\' 3"  (1.6 m)   Wt 127 lb 9.6 oz (57.9 kg)   SpO2 94%   BMI 22.60 kg/m    GEN: Well nourished, well developed, in no acute distress  HEENT: normal  Neck: no JVD, carotid bruits, or masses Cardiac: RRR; there is a gr 1-2/6 systolic ejection murmur RUSB with a blowing 2-3/6 diastolic murmur, no edema  Respiratory:  clear to auscultation bilaterally, normal work of breathing GI: soft, nontender, nondistended, + BS MS: no deformity or atrophy  Skin: warm and dry, no rash Neuro:  Alert and Oriented x 3, Strength and sensation are intact Psych: euthymic mood, full affect  Wt Readings from Last 3 Encounters:  03/28/22 127 lb 9.6 oz (57.9 kg)  12/05/21 130 lb 9.6 oz (59.2 kg)  11/22/21 129 lb (58.5 kg)      Studies/Labs Reviewed:   EKG:  EKG is  ordered today.  NSR rate 54. LVH with QRS widening. Old inferior infarct. I have personally reviewed and interpreted this study.  Recent Labs: 11/10/2021: TSH 0.978 03/06/2022: BUN 14; Creatinine, Ser 0.79; Potassium 4.4; Sodium 142   Lipid Panel    Component Value Date/Time   CHOL 190 07/19/2016 1050   TRIG 77 07/19/2016 1050   HDL 86 07/19/2016 1050   CHOLHDL 2.2 07/19/2016 1050   VLDL 15 07/19/2016 1050   LDLCALC 89 07/19/2016 1050  Dated 04/14/20: normal CBC Dated 11/14/20: cholesterol 198, triglycerides 80, HDL 77, LDL 105. CMET and TSH normal Dated 11/16/21: cholesterol 142, triglycerides 61, HDL 60, LDL 70. CMET and TSH normal   Additional studies/ records that were reviewed today include:  Myoview 07/13/16:Study Highlights   Nuclear stress EF: 60%. The left ventricular ejection fraction is normal (55-65%). There was no ST segment deviation noted during stress. There is a small defect  of mild severity present in the apical lateral and apical location. The defect is reversible and is consistent with a small area of ischemia although shifting breast attenuation artifact. is also a possiblity. This is an intermediate risk study due to apical reversible defect.   Echo: 07/23/16:Study Conclusions   - Left ventricle: The cavity size was normal. There was mild focal   basal hypertrophy of the septum. Systolic function was normal.   The estimated ejection fraction was in the range of 60% to 65%.   Wall motion was normal; there were no regional wall motion   abnormalities. - Aortic valve: Probably trileaflet; moderately thickened,   moderately calcified leaflets. Transvalvular velocity was   minimally increased. There was no stenosis. There was severe   regurgitation. - Aorta: Ascending aortic diameter: 44 mm (S). - Aortic root: The aortic root was moderately dilated. - Mitral valve: There was mild regurgitation.  Cardiac cath 07/24/16:Conclusion     The left ventricular systolic function is normal. LV end diastolic pressure is normal. The left ventricular ejection fraction is 55-65% by visual estimate. Prox LAD lesion, 20 %stenosed. Prox RCA to Mid RCA lesion, 10 %stenosed.   1. Mild nonobstructive CAD 2. Normal LV function 3. Normal LVEDP   Plan: medical management.   Event monitor November 2017: normal   Echo 11/17/19: IMPRESSIONS     1. Left ventricular ejection fraction, by estimation, is 60 to 65%. The  left ventricle has normal function. The left ventricle has no regional  wall motion abnormalities. There is severe asymmetric left ventricular  hypertrophy of the basal-septal  segment. Left ventricular diastolic parameters are indeterminate.   2. Right ventricular systolic function is normal. The right ventricular  size is normal. Tricuspid regurgitation signal is inadequate for assessing  PA pressure.   3. The mitral valve is normal in structure. Trivial  mitral valve  regurgitation.  4. The aortic valve was not well visualized. Aortic valve regurgitation  is moderate. No aortic stenosis is present.   5. Aortic dilatation noted. There is dilatation of the ascending aorta  measuring 44 mm.   6. The inferior vena cava is normal in size with greater than 50%  respiratory variability, suggesting right atrial pressure of 3 mmHg.   Cardiac monitor 06/2021: Normal sinus rhythm 6 episode of nonsustained VT 4-7 beats Episodes of SVT- appear reentrant with sudden onset and termination. longest lasting 2 minutes and 54 seconds at rate 156 bpm     Patch Wear Time:  14 days and 0 hours (2022-09-29T15:04:27-0400 to 2022-10-13T15:04:27-0400)   Patient had a min HR of 50 bpm, max HR of 188 bpm, and avg HR of 69 bpm. Predominant underlying rhythm was Sinus Rhythm. 6 Ventricular Tachycardia runs occurred, the run with the fastest interval lasting 4 beats with a max rate of 188 bpm, the longest  lasting 7 beats with an avg rate of 128 bpm. 18 Supraventricular Tachycardia runs occurred, the run with the fastest interval lasting 2 mins 54 secs with a max rate of 156 bpm (avg 143 bpm); the run with the fastest interval was also the longest.  Supraventricular Tachycardia was detected within +/- 45 seconds of symptomatic patient event(s). Isolated SVEs were rare (<1.0%), SVE Couplets were rare (<1.0%), and SVE Triplets were rare (<1.0%). Isolated VEs were rare (<1.0%, 2958), VE Couplets were  rare (<1.0%, 115), and VE Triplets were rare (<1.0%, 7).      Echocardiogram 01/2021: 1. Left ventricular ejection fraction, by estimation, is 65 to 70%. The  left ventricle has normal function. The left ventricle has no regional  wall motion abnormalities. There is mild left ventricular hypertrophy.  Left ventricular diastolic parameters  are consistent with Grade I diastolic dysfunction (impaired relaxation).   2. Right ventricular systolic function is normal. The right  ventricular  size is normal.   3. The mitral valve is normal in structure. No evidence of mitral valve  regurgitation. No evidence of mitral stenosis.   4. The aortic valve is normal in structure. Aortic valve regurgitation is  severe. Moderate aortic valve stenosis. Aortic regurgitation PHT measures  415 msec. Aortic valve mean gradient measures 18.0 mmHg. Aortic valve Vmax  measures 3.04 m/s.   5. There is borderline dilatation of the ascending aorta, measuring 39  mm.   6. The inferior vena cava is normal in size with greater than 50%  respiratory variability, suggesting right atrial pressure of 3 mmHg.   Echo 08/01/21: IMPRESSIONS     1. Left ventricular ejection fraction, by estimation, is 60 to 65%. Left  ventricular ejection fraction by 3D volume is 65 %. The left ventricle has  normal function. The left ventricle has no regional wall motion  abnormalities. There is moderate left  ventricular hypertrophy. Left ventricular diastolic parameters are  consistent with Grade I diastolic dysfunction (impaired relaxation).   2. Right ventricular systolic function is normal. The right ventricular  size is normal. Tricuspid regurgitation signal is inadequate for assessing  PA pressure.   3. Left atrial size was severely dilated.   4. The pericardial effusion is anterior to the right ventricle.   5. The mitral valve is degenerative. Mild mitral valve regurgitation. No  evidence of mitral stenosis.   6. The aortic valve is calcified. Aortic valve regurgitation is moderate  to severe. Moderate aortic valve stenosis. Aortic regurgitation PHT  measures 492 msec. Aortic valve area, by  VTI measures 1.00 cm. Aortic  valve mean gradient measures 19.0 mmHg.  Aortic valve Vmax measures 3.01 m/s.   7. Aortic dilatation noted. There is mild dilatation of the aortic root,  measuring 39 mm.   8. The inferior vena cava is normal in size with greater than 50%  respiratory variability, suggesting  right atrial pressure of 3 mmHg.   Comparison(s): EF 65%, moderate AS with severe AI, mean gradient 18 mmHg,  peak 36.58mmHg, Ascend Aor 39mm, AI PHT 415 msec, mild MAC. Compared to  study dated 01/23/2021, The mean AVG has increased from 18 to .  Visually AI appears moderate to severe  but only mild by PHT. Echo images are suboptimol. Consider TEE of the AV  if clinically indicated.     EXAM: CT ANGIOGRAPHY CHEST WITH CONTRAST   TECHNIQUE: Multidetector CT imaging of the chest was performed using the standard protocol during bolus administration of intravenous contrast. Multiplanar CT image reconstructions and MIPs were obtained to evaluate the vascular anatomy.   RADIATION DOSE REDUCTION: This exam was performed according to the departmental dose-optimization program which includes automated exposure control, adjustment of the mA and/or kV according to patient size and/or use of iterative reconstruction technique.   CONTRAST:  75mL OMNIPAQUE IOHEXOL 350 MG/ML SOLN   COMPARISON:  CT angiography chest 09/26/2021   FINDINGS: Cardiovascular: Preferential opacification of the pulmonary artery. Stable aneurysmal ascending thoracic aorta measuring up to 4.5 cm in caliber. The descending thoracic aorta is normal in caliber. No dissection. Mild atherosclerotic plaque of the thoracic aorta. At least 2 vessel coronary artery calcifications. Normal heart size. No significant pericardial effusion. The main pulmonary artery is normal in caliber. No pulmonary embolus.   Mediastinum/Nodes: No enlarged mediastinal, hilar, or axillary lymph nodes. Thyroid gland, trachea, and esophagus demonstrate no significant findings.   Lungs/Pleura: Bilateral lower lobe linear atelectasis versus scarring. No focal consolidation. No pulmonary nodule. No pulmonary mass. No pleural effusion. No pneumothorax.   Upper Abdomen: No acute abnormality.   Musculoskeletal:   No chest wall abnormality.    No suspicious lytic or blastic osseous lesions. No acute displaced fracture. Multilevel degenerative changes of the spine.   Review of the MIP images confirms the above findings.   IMPRESSION: 1. Stable aneurysmal ascending thoracic aorta (4.5 cm). Ascending thoracic aortic aneurysm. Recommend semi-annual imaging followup by CTA or MRA and referral to cardiothoracic surgery if not already obtained. This recommendation follows 2010 ACCF/AHA/AATS/ACR/ASA/SCA/SCAI/SIR/STS/SVM Guidelines for the Diagnosis and Management of Patients With Thoracic Aortic Disease. Circulation. 2010; 121: Z610-R604. Aortic aneurysm NOS (ICD10-I71.9). 2. Aortic Atherosclerosis (ICD10-I70.0) with at least 2 vessel coronary calcifications. 3. No acute intrapulmonary abnormality.     Electronically Signed   By: Tish Frederickson M.D.   On: 03/12/2022 15:18  Echo 03/07/22: IMPRESSIONS     1. Left ventricular ejection fraction, by estimation, is 60 to 65%. The  left ventricle has normal function. The left ventricle has no regional  wall motion abnormalities. The left ventricular internal cavity size was  mildly dilated. There is severe  concentric left ventricular hypertrophy of the basal-septal segment. Left  ventricular diastolic parameters are consistent with Grade I diastolic  dysfunction (impaired relaxation).   2. Right ventricular systolic function is normal. The right ventricular  size is normal. There is normal pulmonary artery systolic pressure. The  estimated right ventricular systolic pressure is 19.0 mmHg.   3. The mitral valve is degenerative. Mild mitral valve regurgitation. No  evidence of mitral stenosis.  4. The aortic valve is abnormal. Aortic valve regurgitation is severe.  Aortic valve area, by VTI measures 1.29 cm, Aortic valve mean gradient  measures 28.0 mmHg, Aortic valve Vmax measures 3.83 m/s, DVI is 0.41 but  visually the AV does not appear  stenotic. 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 Cannot get accurate waveforms to demonstrate LVOT  gradient compared to AV gradient. The AV   has been reported as moderate AS in the past but in the M Mode and  visually on 2D images the leaflets appear to open well with some AV  sclerosis. Suspect that increased AV gradient and peak velocities are due  in part to increased flow across AV from AI  as well as increased velocity in LVOT from severe BSH.   5. Aortic dilatation noted. Aneurysm of the ascending aorta, measuring 45  mm.   6. The inferior vena cava is normal in size with greater than 50%  respiratory variability, suggesting right atrial pressure of 3 mmHg.   7. Recommend TEE to get planimetry of the AV for accurate measurement of  the AVA as well as assess severity of AI further given LV dilatation.   ASSESSMENT:    1. Nonrheumatic aortic valve insufficiency   2. Aneurysm of ascending aorta without rupture (HCC)   3. Hyperlipidemia LDL goal <70   4. Essential hypertension      PLAN:  In order of problems listed above:  1. Hypertension: BP is now well controlled on valsartan, amlodipine and Coreg.  Continue sodium restriction.  2. Non-obstructive CAD: Cath in 2017 showed mild nonobstructive CAD (pLAD 20%, p-m RCA 10%).  Recent CT chest showed mild atherosclerosis, coronary artery calcification.   Continue Crestor.  3. Moderate-severe AI/aortic stenosis: Most recent recent echocardiogram in June showed EF 60 to 65%, without LV enlargement.  Severe AI. She is asymptomatic. Continue to focus on optimizing BP. Given stability on Echo I think we can follow Echo yearly unless she becomes symptomatic. Indication for surgery include development of symptoms or evidence of LV enlargement/dysfunction.  4. Ascending aortic aneurysm: CT chest in January 2023 showed known ascending aortic aneurysm measuring  4.7 cm. Repeat on July 3 measured 4.5 cm. No change. Follows with Dr Dorris Fetch. Continue optimal BP control. Recommend avoidance of quinolone antibiotics.   5. PSVT/syncope: Outpatient monitor in 06/2021 in the setting of intermittent tachypalpitations showed PSVT, longest episode lasting 2 minutes 34 seconds.  no sustained arrhythmia. Continue Coreg.   6. Hyperlipidemia: LDL was 70 last week.  Continue Crestor.     Medication Adjustments/Labs and Tests Ordered: Current medicines are reviewed at length with the patient today.  Concerns regarding medicines are outlined above.  Medication changes, Labs and Tests ordered today are listed in the Patient Instructions below. There are no Patient Instructions on file for this visit.    Signed, Maelyn Berrey [place], MD  03/28/2022 10:00 AM    [facility] [address] [place], [place], [address] [phone]

## 2022-03-25 NOTE — Telephone Encounter (Signed)
Thanks to Dr. Fuller Plan for his assistance while I am on vacation.

## 2022-03-26 ENCOUNTER — Other Ambulatory Visit: Payer: Medicare PPO

## 2022-03-26 ENCOUNTER — Other Ambulatory Visit: Payer: Self-pay | Admitting: Gastroenterology

## 2022-03-26 DIAGNOSIS — K625 Hemorrhage of anus and rectum: Secondary | ICD-10-CM

## 2022-03-26 DIAGNOSIS — R197 Diarrhea, unspecified: Secondary | ICD-10-CM

## 2022-03-28 ENCOUNTER — Encounter: Payer: Self-pay | Admitting: Cardiology

## 2022-03-28 ENCOUNTER — Ambulatory Visit: Payer: Medicare PPO | Admitting: Cardiology

## 2022-03-28 VITALS — BP 138/78 | HR 54 | Ht 63.0 in | Wt 127.6 lb

## 2022-03-28 DIAGNOSIS — E785 Hyperlipidemia, unspecified: Secondary | ICD-10-CM | POA: Diagnosis not present

## 2022-03-28 DIAGNOSIS — I10 Essential (primary) hypertension: Secondary | ICD-10-CM

## 2022-03-28 DIAGNOSIS — I351 Nonrheumatic aortic (valve) insufficiency: Secondary | ICD-10-CM

## 2022-03-28 DIAGNOSIS — I7121 Aneurysm of the ascending aorta, without rupture: Secondary | ICD-10-CM | POA: Diagnosis not present

## 2022-03-29 LAB — GI PROFILE, STOOL, PCR

## 2022-03-30 ENCOUNTER — Inpatient Hospital Stay: Admission: RE | Admit: 2022-03-30 | Payer: Medicare PPO | Source: Ambulatory Visit

## 2022-03-30 ENCOUNTER — Telehealth: Payer: Self-pay

## 2022-03-30 NOTE — Telephone Encounter (Signed)
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 MD is already aware & has provided recommendations. Patient has been notified.

## 2022-04-03 ENCOUNTER — Encounter: Payer: Self-pay | Admitting: Thoracic Surgery (Cardiothoracic Vascular Surgery)

## 2022-04-03 ENCOUNTER — Ambulatory Visit: Payer: Medicare PPO | Admitting: Thoracic Surgery (Cardiothoracic Vascular Surgery)

## 2022-04-03 VITALS — BP 120/68 | HR 66 | Resp 20 | Ht 63.0 in | Wt 128.0 lb

## 2022-04-03 DIAGNOSIS — I7121 Aneurysm of the ascending aorta, without rupture: Secondary | ICD-10-CM

## 2022-04-03 DIAGNOSIS — I350 Nonrheumatic aortic (valve) stenosis: Secondary | ICD-10-CM | POA: Diagnosis not present

## 2022-04-03 NOTE — Progress Notes (Signed)
[address]       [place] [address]             [phone]     HPI: Erika Wu returns for follow-up of her ascending aneurysm.  Erika Wu is a 76 year old woman with a history of heart murmur, aortic stenosis, aortic insufficiency, ascending aneurysm, hypertension, hyperlipidemia, reflux, anxiety, and depression.  She is followed by Dr. Peter Wu for her aortic valve.  I have been following her aneurysm.  I last saw her in January 2023.  She was doing well at that time.  The aneurysm was stable at 4.5 cm.  She was having some issues with blood pressure management.  In the interim since her last visit she has been feeling well.  She saw Dr. Martinique a couple of weeks ago.  Her echo showed severe AI but no evidence of left ventricular dilatation.  She had preserved left ventricular systolic function.  She feels well.  She is not having any chest pain, pressure, tightness, shortness of breath, or peripheral edema.  Past Medical History:  Diagnosis Date   Allergy    Anxiety    Chest pain    a. 03/2015 Lexiscan MV: EF 59%, no ischemia/infarct.   Colon polyps    COVID-19 virus infection    09-22-2020, fully vax'd    Depression    GERD (gastroesophageal reflux disease)    Heart murmur    Hemorrhoids    Hyperlipidemia    Hypertension    Moderate aortic insufficiency    a. 03/2015 Echo: EF 55-60%, no rwma, mild LVH, Gr 1 DD, mild AS, mod AI (bicuspid AoV by report, poorly visualized on this study), Asc Ao diam 37m, triv MR.    Current Outpatient Medications  Medication Sig Dispense Refill   amLODipine (NORVASC) 5 MG tablet Take 1 tablet (5 mg total) by mouth daily. 90 tablet 3   buPROPion (WELLBUTRIN XL) 150 MG 24 hr tablet Take 450 mg by mouth every morning.  3   FIBER SELECT GUMMIES PO Take by mouth.     hydrocortisone (ANUSOL-HC) 25 MG suppository Place 1 suppository (25 mg total) rectally daily for 7 days. Then daily as needed. 14 suppository 1    mirtazapine (REMERON) 30 MG tablet Take 30 mg by mouth at bedtime.     pantoprazole (PROTONIX) 40 MG tablet TAKE 1 TABLET(40 MG) BY MOUTH TWICE DAILY 180 tablet 3   rosuvastatin (CRESTOR) 10 MG tablet Take 1 tablet by mouth daily.     Suvorexant (BELSOMRA) 20 MG TABS Take by mouth.     valsartan (DIOVAN) 80 MG tablet Take 1 tablet (80 mg total) by mouth 2 (two) times daily. 180 tablet 3   Vitamin D-Vitamin K (VITAMIN K2-VITAMIN D3) 45-2000 MCG-UNIT CAPS once a week.     carvedilol (COREG) 25 MG tablet Take 1 tablet (25 mg total) by mouth 2 (two) times daily. 180 tablet 3   hydrALAZINE (APRESOLINE) 10 MG tablet Take 1 tablet (10 mg total) by mouth as needed (as needed for systolic Blood pressures greater than 170). 30 tablet 1   No current facility-administered medications for this visit.    Physical Exam BP 120/68   Pulse 66   Resp 20   Ht '5\' 3"'$  (1.6 m)   Wt 128 lb (58.1 kg)   SpO2 95% Comment: RA  BMI 22.648kg/m  76year old woman in no acute distress Alert and oriented x3 with no focal deficits No  carotid bruits Cardiac regular rate and rhythm with a 2/6 systolic murmur and 2/6 to 3/6 diastolic murmur Lungs clear with equal breath sounds bilaterally No peripheral edema   Diagnostic Tests: CT ANGIOGRAPHY CHEST WITH CONTRAST   TECHNIQUE: Multidetector CT imaging of the chest was performed using the standard protocol during bolus administration of intravenous contrast. Multiplanar CT image reconstructions and MIPs were obtained to evaluate the vascular anatomy.   RADIATION DOSE REDUCTION: This exam was performed according to the departmental dose-optimization program which includes automated exposure control, adjustment of the mA and/or kV according to patient size and/or use of iterative reconstruction technique.   CONTRAST:  25m OMNIPAQUE IOHEXOL 350 MG/ML SOLN   COMPARISON:  CT angiography chest 09/26/2021   FINDINGS: Cardiovascular: Preferential opacification of  the pulmonary artery. Stable aneurysmal ascending thoracic aorta measuring up to 4.5 cm in caliber. The descending thoracic aorta is normal in caliber. No dissection. Mild atherosclerotic plaque of the thoracic aorta. At least 2 vessel coronary artery calcifications. Normal heart size. No significant pericardial effusion. The main pulmonary artery is normal in caliber. No pulmonary embolus.   Mediastinum/Nodes: No enlarged mediastinal, hilar, or axillary lymph nodes. Thyroid gland, trachea, and esophagus demonstrate no significant findings.   Lungs/Pleura: Bilateral lower lobe linear atelectasis versus scarring. No focal consolidation. No pulmonary nodule. No pulmonary mass. No pleural effusion. No pneumothorax.   Upper Abdomen: No acute abnormality.   Musculoskeletal:   No chest wall abnormality.   No suspicious lytic or blastic osseous lesions. No acute displaced fracture. Multilevel degenerative changes of the spine.   Review of the MIP images confirms the above findings.   IMPRESSION: 1. Stable aneurysmal ascending thoracic aorta (4.5 cm). Ascending thoracic aortic aneurysm. Recommend semi-annual imaging followup by CTA or MRA and referral to cardiothoracic surgery if not already obtained. This recommendation follows 2010 ACCF/AHA/AATS/ACR/ASA/SCA/SCAI/SIR/STS/SVM Guidelines for the Diagnosis and Management of Patients With Thoracic Aortic Disease. Circulation. 2010; 121:: N867-E720 Aortic aneurysm NOS (ICD10-I71.9). 2. Aortic Atherosclerosis (ICD10-I70.0) with at least 2 vessel coronary calcifications. 3. No acute intrapulmonary abnormality.     Electronically Signed   By: MIven FinnM.D.   On: 03/12/2022 15:18 I personally reviewed the CT images.  There is an ascending aortic aneurysm.  By my measurement was 4.6 cm.  Essentially unchanged from her last study.  Aortic and coronary atherosclerosis also noted.  Impression: Erika Manninenis a 76year old woman  with a history of heart murmur, aortic stenosis, aortic insufficiency, ascending aneurysm, hypertension, hyperlipidemia, reflux, anxiety, and depression.    Ascending aneurysm/thoracic aortic atherosclerosis-aneurysm stable at about 4.6 cm.  No significant change.  Needs continued semiannual follow-up.  Hypertension-blood pressure well controlled at today's visit.  She says it has been stable at home.  Severe aortic insufficiency with mild to moderate aortic stenosis-no heart failure symptoms or signs of left ventricular dilatation on echo.  I suspect she will ultimately need surgery, but for now is doing well with medical management.  Plan: Return in 6 months with CT angio chest  SMelrose Nakayama MD [facility] ([phone]

## 2022-04-10 ENCOUNTER — Telehealth: Payer: Self-pay | Admitting: Gastroenterology

## 2022-04-10 NOTE — Telephone Encounter (Signed)
Patient advised to only take the 40 mg pantoprazole BID as prescribed. She states her diarrhea has improved some, but still experiencing some nausea. Patient advised to eat a bland diet & eat small, frequent meals throughout the day, and to discuss with Estill Bamberg, PA at follow up next week about medication.

## 2022-04-10 NOTE — Telephone Encounter (Signed)
Inbound call from patient requesting a call back to discuss if she can take Protonix 3 times daily instead of twice daily. Please advise.

## 2022-04-16 NOTE — Progress Notes (Unsigned)
04/17/2022 Erika Wu [id number] May 15, 1946  Referring provider: Michael Boston, MD Primary GI doctor: Dr. Tarri Glenn  ASSESSMENT AND PLAN:   Assessment: 76 y.o. female here for assessment of the following: 1. Abdominal bloating   2. Nausea without vomiting   3. Gastroesophageal reflux disease without esophagitis   4. Diarrhea, unspecified type   5. History of colonic polyps   6. Lesion of pancreas    Treated with xifaxin and neomycin 02/23 with some improvement in symptoms.  Recent increase in diarrhea unusual from her normal, tested positive for E. coli.  States the symptoms have improved, no more blood in the stool, bowel movements closer to her "normal" however she has worsening abdominal bloating, gas, reflux, with waves of nausea. EGD 01/2021 showed peptic duodenitis, negative for H. pylori, ulcers, strictures. Abnormal CT with pancreatic lesion, scheduled for MRCP in December. 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 Will add on famotidine to Protonix twice daily. Will go ahead and get MRCP sooner with symptoms that she is due in December and check for EPI. If symptoms do not improve, can consider repeat treatment for SIBO. Consider GES if not improving.  Follow up 2 months.   Orders Placed This Encounter  Procedures   MR ABDOMEN MRCP W WO CONTAST   CBC with Differential/Platelet   Basic metabolic panel   Hepatic function panel   Pancreatic elastase, fecal   Fecal fat, qualitative    Meds ordered this encounter  Medications   famotidine (PEPCID) 40 MG tablet    Sig: Take 1 tablet (40 mg total) by mouth at bedtime.    Dispense:  90 tablet    Refill:  1    History of Present Illness:  76 y.o. female  with a past medical history of anxiety, depression, hypertension, osteoporosis, aortic insufficiency,  personal history of tubular adenomatous and others listed below, returns to clinic today for evaluation of diarrhea and hemorrhoids.  11/22/2021 office visit with Dr. Tarri Glenn, having worsening bowel habits after death of her mother in 10-08-22, nervous stomach with frequent Worsening eructation and flatulence.  SIBO testing 10/2021 positive for methane, resolution of symptoms with Xifaxan and neomycin for 14 days but symptoms returned. 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  03/21/2022 call the office with complaints of bright red blood in her stool with diarrhea in the mornings for the last week, denies rectal pain, nausea, vomiting, abdominal pain.   Recommend stool studies GI pathogen panel Anusol HC suppositories and follow-up.  She was having diarrhea that was different than what she normally deals with, was having it up to 4-5 times a day.  She had positive Ecoli, she states she has no longer seeing blood in the stool. Never called back for ABX.  Now she is having her "normal" diarrhea but worsening burping and nausea. Has waves of nausea, AB bloating, and has GERD.  Has noticed some associations with her diet, states if she eats late she will have worsening burping and loose stools.  If she eats prior to 7 pm or eats small meal, this can help.  She denies melena, dysphagia.  Denies weight loss.   Remote history of esophageal stricture status post dilatation 2010, patient's had multiple colonoscopies last 1 being 10/31/2020 sessile serrated polyps  recommended follow-up 10/2025   Wt Readings from Last 6 Encounters:  04/17/22 128 lb (58.1 kg)  04/03/22 128 lb (58.1 kg)  03/28/22 127 lb 9.6 oz (57.9 kg)  12/05/21 130 lb 9.6 oz (59.2 kg)  11/22/21 129 lb (58.5 kg)  11/10/21 129 lb (58.5 kg)    Current Medications:    Current Outpatient Medications (Cardiovascular):    amLODipine  (NORVASC) 5 MG tablet, Take 1 tablet (5 mg total) by mouth daily.   rosuvastatin (CRESTOR) 10 MG tablet, Take 1 tablet by mouth daily.   valsartan (DIOVAN) 80 MG tablet, Take 1 tablet (80 mg total) by mouth 2 (two) times daily.   carvedilol (COREG) 25 MG tablet, Take 1 tablet (25 mg total) by mouth 2 (two) times daily.   hydrALAZINE (APRESOLINE) 10 MG tablet, Take 1 tablet (10 mg total) by mouth as needed (as needed for systolic Blood pressures greater than 170).     Current Outpatient Medications (Other):    buPROPion (WELLBUTRIN XL) 150 MG 24 hr tablet, Take 450 mg by mouth every morning.   FIBER SELECT GUMMIES PO, Take by mouth.   hydrocortisone (ANUSOL-HC) 25 MG suppository, Place 1 suppository (25 mg total) rectally daily for 7 days. Then daily as needed.   mirtazapine (REMERON) 30 MG tablet, Take 30 mg by mouth at bedtime.   pantoprazole (PROTONIX) 40 MG tablet, TAKE 1 TABLET(40 MG) BY MOUTH TWICE DAILY   Suvorexant (BELSOMRA) 20 MG TABS, Take by mouth.   Vitamin D-Vitamin K (VITAMIN K2-VITAMIN D3) 45-2000 MCG-UNIT CAPS, once a week.  Surgical History:  She  has a past surgical history that includes Tonsillectomy and adenoidectomy; Tubal ligation; Colonoscopy; Cardiac catheterization (N/A, 07/24/2016); and Polypectomy. Family History:  Her family history includes Colon cancer in her maternal grandmother; Diverticulosis in her maternal grandfather; HIV in her brother; Heart attack in her father; Heart disease in her father; Stomach cancer in her maternal grandmother; Stroke in her paternal grandfather. Social History:   reports that she has quit smoking. Her smoking use included cigarettes. She has a 7.50 pack-year smoking history. She has never used smokeless tobacco. She reports current alcohol use. She reports that she does not use drugs.  Current Medications, Allergies, Past Medical History, Past Surgical History, Family History and Social History were reviewed in [facility] record.  Physical Exam: BP 110/62   Pulse 64   Ht '5\' 3"'$  (1.6 m)   Wt 128 lb (58.1 kg)   BMI 22.67 kg/m  General:   Pleasant, well developed female in no acute distress Heart : Regular rate and rhythm; harsh holosystolic murmur RSB. Pulm: Clear anteriorly; no wheezing Abdomen:  Soft, Non-distended AB, Active bowel sounds. mild tenderness in the epigastrium. Without guarding and Without rebound, No organomegaly appreciated. Rectal: Not evaluated Extremities:  without  edema. Neurologic:  Alert and  oriented x4;  No focal deficits.  Psych:  Cooperative. Normal mood and affect.   Vladimir Crofts, PA-C 04/17/22

## 2022-04-17 ENCOUNTER — Other Ambulatory Visit (INDEPENDENT_AMBULATORY_CARE_PROVIDER_SITE_OTHER): Payer: Medicare PPO

## 2022-04-17 ENCOUNTER — Ambulatory Visit: Payer: Medicare PPO | Admitting: Physician Assistant

## 2022-04-17 ENCOUNTER — Encounter: Payer: Self-pay | Admitting: Physician Assistant

## 2022-04-17 VITALS — BP 110/62 | HR 64 | Ht 63.0 in | Wt 128.0 lb

## 2022-04-17 DIAGNOSIS — R140 Abdominal distension (gaseous): Secondary | ICD-10-CM | POA: Diagnosis not present

## 2022-04-17 DIAGNOSIS — K869 Disease of pancreas, unspecified: Secondary | ICD-10-CM

## 2022-04-17 DIAGNOSIS — R197 Diarrhea, unspecified: Secondary | ICD-10-CM | POA: Diagnosis not present

## 2022-04-17 DIAGNOSIS — K219 Gastro-esophageal reflux disease without esophagitis: Secondary | ICD-10-CM | POA: Diagnosis not present

## 2022-04-17 DIAGNOSIS — R110 Nausea: Secondary | ICD-10-CM

## 2022-04-17 DIAGNOSIS — Z86010 Personal history of colonic polyps: Secondary | ICD-10-CM

## 2022-04-17 LAB — BASIC METABOLIC PANEL
BUN: 17 mg/dL (ref 6–23)
CO2: 26 mEq/L (ref 19–32)
Calcium: 9.3 mg/dL (ref 8.4–10.5)
Chloride: 104 mEq/L (ref 96–112)
Creatinine, Ser: 0.9 mg/dL (ref 0.40–1.20)
GFR: 62.65 mL/min (ref >60.00)
Glucose, Bld: 88 mg/dL (ref 70–99)
Potassium: 4.1 mEq/L (ref 3.5–5.1)
Sodium: 139 mEq/L (ref 135–145)

## 2022-04-17 LAB — HEPATIC FUNCTION PANEL
ALT: 20 U/L (ref 0–35)
AST: 18 U/L (ref 0–37)
Albumin: 4.3 g/dL (ref 3.5–5.2)
Alkaline Phosphatase: 90 U/L (ref 39–117)
Bilirubin, Direct: 0.1 mg/dL (ref 0.0–0.3)
Total Bilirubin: 0.5 mg/dL (ref 0.2–1.2)
Total Protein: 6.6 g/dL (ref 6.0–8.3)

## 2022-04-17 LAB — CBC WITH DIFFERENTIAL/PLATELET
Basophils Absolute: 0 10*3/uL (ref 0.0–0.1)
Basophils Relative: 0.6 % (ref 0.0–3.0)
Eosinophils Absolute: 0.2 10*3/uL (ref 0.0–0.7)
Eosinophils Relative: 3.6 % (ref 0.0–5.0)
HCT: 36.5 % (ref 36.0–46.0)
Hemoglobin: 12.6 g/dL (ref 12.0–15.0)
Lymphocytes Relative: 21.5 % (ref 12.0–46.0)
Lymphs Abs: 1.4 10*3/uL (ref 0.7–4.0)
MCHC: 34.5 g/dL (ref 30.0–36.0)
MCV: 93.8 fl (ref 78.0–100.0)
Monocytes Absolute: 0.6 10*3/uL (ref 0.1–1.0)
Monocytes Relative: 9.7 % (ref 3.0–12.0)
Neutro Abs: 4.1 10*3/uL (ref 1.4–7.7)
Neutrophils Relative %: 64.6 % (ref 43.0–77.0)
Platelets: 178 10*3/uL (ref 150.0–400.0)
RBC: 3.89 Mil/uL (ref 3.87–5.11)
RDW: 12.2 % (ref 11.5–15.5)
WBC: 6.3 10*3/uL (ref 4.0–10.5)

## 2022-04-17 MED ORDER — FAMOTIDINE 40 MG PO TABS: 40.0000 mg | ORAL_TABLET | Freq: Every day | ORAL | 1 refills | Status: DC

## 2022-04-17 NOTE — Patient Instructions (Addendum)
Your provider has requested that you go to the basement level for lab work before leaving today. Press "B" on the elevator. The lab is located at the first door on the left as you exit the elevator.  Due to recent changes in healthcare laws, you may see the results of your imaging and laboratory studies on MyChart before your provider has had a chance to review them.  We understand that in some cases there may be results that are confusing or concerning to you. Not all laboratory results come back in the same time frame and the provider may be waiting for multiple results in order to interpret others.  Please give [place] 48 hours in order for your provider to thoroughly review all the results before contacting the office for clarification of your results.    You will be contacted by [facility] in the next 7 days to arrange a MRI/MRCP.  The number on your caller ID will be [phone], please answer when they call.  If you have not heard from them in 7 days please call [phone] to schedule.     Try these things below:  Can add on famotidine 40 mg once at night to the pantoprazole 40 mg BID.  Try trial off milk/lactose products.  Add fiber like benefiber or citracel once a day Can do trial of FDGard for AB pain EVERY DAY- Take 1-2 capsules once a day for maintence or twice a day during a flare if any worsening symptoms like blood in stool, weight loss, please call the office or go to the ER.    FODMAP stands for fermentable oligo-, di-, mono-saccharides and polyols (1). These are the scientific terms used to classify groups of carbs that are notorious for triggering digestive symptoms like bloating, gas and stomach pain.   FODMAPs are found in a wide range of foods in varying amounts. Some foods contain just one type, while others contain several.  The main dietary sources of the four groups of FODMAPs include:  Oligosaccharides: Wheat, rye, legumes and various fruits and  vegetables, such as garlic and onions.  Disaccharides: Milk, yogurt and soft cheese. Lactose is the main carb.  Monosaccharides: Various fruit including figs and mangoes, and sweeteners such as honey and agave nectar. Fructose is the main carb.  Polyols: Certain fruits and vegetables including blackberries and lychee, as well as some low-calorie sweeteners like those in sugar-free gum.   Keep a food diary. This will help you identify foods that cause symptoms. Write down: What you eat and when. What symptoms you have. When symptoms occur in relation to your meals. Avoid foods that cause symptoms. Talk with your dietitian about other ways to get the same nutrients that are in these foods. Eat your meals slowly, in a relaxed setting. Aim to eat 5-6 small meals per day. Do not skip meals. Drink enough fluids to keep your urine clear or pale yellow. If dairy products cause your symptoms to flare up, try eating less of them. You might be able to handle yogurt better than other dairy products because it contains bacteria that help with digestion.      Remember belching is caused by excessive air swallowing.   Please stop: Eating or drinking too fast  Poorly fitting dentures; not chewing food completely  Carbonated beverages  Chewing gum or sucking on hard candies  Excessive swallowing due to nervous tension or postnasal drip  Forced belching to relieve abdominal discomfort To prevent excessive belching,  avoid:  Carbonated beverages  Chewing gum  Hard candies   Simethicone/GasX may be helpful  Can try Florastor probiotic twice a day   Thank you for entrusting me with your care and choosing [facility].  Vicie Mutters, PA

## 2022-04-17 NOTE — Progress Notes (Signed)
Reviewed and agree with management plans. Agree with retreatment of SIBO if symptoms do not improve.   Crosley Stejskal L. Tarri Glenn, MD, MPH

## 2022-04-19 ENCOUNTER — Other Ambulatory Visit: Payer: Medicare PPO

## 2022-04-19 ENCOUNTER — Other Ambulatory Visit: Payer: Self-pay

## 2022-04-19 DIAGNOSIS — R140 Abdominal distension (gaseous): Secondary | ICD-10-CM

## 2022-04-19 DIAGNOSIS — R110 Nausea: Secondary | ICD-10-CM

## 2022-04-25 LAB — FECAL FAT, QUALITATIVE
Fat Qual Neutral, Stl: NORMAL
Fat Qual Total, Stl: NORMAL

## 2022-04-25 LAB — PANCREATIC ELASTASE, FECAL: Pancreatic Elastase-1, Stool: 419 mcg/g

## 2022-04-26 ENCOUNTER — Other Ambulatory Visit: Payer: Self-pay

## 2022-04-26 MED ORDER — RIFAXIMIN 550 MG PO TABS: 550.0000 mg | ORAL_TABLET | Freq: Three times a day (TID) | ORAL | 0 refills | Status: AC

## 2022-05-02 ENCOUNTER — Telehealth: Payer: Self-pay | Admitting: Physician Assistant

## 2022-05-02 NOTE — Telephone Encounter (Signed)
Patient called stating that the medicine prescribed was too expensive and she needs an alternative.  Please call patient and advise.  Thank you.

## 2022-05-02 NOTE — Telephone Encounter (Signed)
Contacted patient about picking up samples for Xifaxan from office.

## 2022-05-22 ENCOUNTER — Ambulatory Visit (HOSPITAL_COMMUNITY): Payer: Medicare PPO

## 2022-05-30 ENCOUNTER — Ambulatory Visit (HOSPITAL_COMMUNITY): Payer: Medicare PPO

## 2022-05-30 ENCOUNTER — Other Ambulatory Visit: Payer: Self-pay | Admitting: Internal Medicine

## 2022-05-30 DIAGNOSIS — M810 Age-related osteoporosis without current pathological fracture: Secondary | ICD-10-CM

## 2022-06-12 ENCOUNTER — Telehealth: Payer: Self-pay | Admitting: Gastroenterology

## 2022-06-12 MED ORDER — PANTOPRAZOLE SODIUM 40 MG PO TBEC: 40.0000 mg | DELAYED_RELEASE_TABLET | Freq: Two times a day (BID) | ORAL | 3 refills | Status: DC

## 2022-06-12 NOTE — Telephone Encounter (Signed)
Sent script to patient's pharmacy.

## 2022-06-12 NOTE — Telephone Encounter (Signed)
Inbound call from patient requesting refill for Protonix. Patient states she called it into the pharmacy and they told her to contact us. Please give a ca

## 2022-07-11 HISTORY — PX: HAND DEBRIDEMENT: SHX974

## 2022-07-20 ENCOUNTER — Other Ambulatory Visit (HOSPITAL_COMMUNITY): Payer: Self-pay

## 2022-07-23 ENCOUNTER — Encounter (HOSPITAL_COMMUNITY)
Admission: RE | Admit: 2022-07-23 | Discharge: 2022-07-23 | Disposition: A | Discharge status: Home / Self Care | Payer: Medicare PPO | Source: Ambulatory Visit | Attending: Internal Medicine | Admitting: Internal Medicine

## 2022-07-23 DIAGNOSIS — M810 Age-related osteoporosis without current pathological fracture: Secondary | ICD-10-CM | POA: Insufficient documentation

## 2022-07-23 MED ORDER — DENOSUMAB 60 MG/ML ~~LOC~~ SOSY
60.0000 mg | PREFILLED_SYRINGE | Freq: Once | SUBCUTANEOUS | Status: AC
  Administered 2022-07-23: 60 mg via SUBCUTANEOUS

## 2022-07-23 MED ORDER — DENOSUMAB 60 MG/ML ~~LOC~~ SOSY
PREFILLED_SYRINGE | SUBCUTANEOUS | Status: AC
  Filled 2022-07-23: qty 1

## 2022-07-28 ENCOUNTER — Emergency Department (HOSPITAL_COMMUNITY): Payer: Medicare PPO

## 2022-07-28 ENCOUNTER — Emergency Department (HOSPITAL_COMMUNITY)
Admission: EM | Admit: 2022-07-28 | Discharge: 2022-07-28 | Disposition: A | Discharge status: Home / Self Care | Payer: Medicare PPO | Attending: Emergency Medicine | Admitting: Emergency Medicine

## 2022-07-28 DIAGNOSIS — S61511A Laceration without foreign body of right wrist, initial encounter: Secondary | ICD-10-CM | POA: Insufficient documentation

## 2022-07-28 DIAGNOSIS — Z23 Encounter for immunization: Secondary | ICD-10-CM | POA: Insufficient documentation

## 2022-07-28 DIAGNOSIS — S80212A Abrasion, left knee, initial encounter: Secondary | ICD-10-CM | POA: Insufficient documentation

## 2022-07-28 DIAGNOSIS — S6991XA Unspecified injury of right wrist, hand and finger(s), initial encounter: Secondary | ICD-10-CM | POA: Diagnosis present

## 2022-07-28 DIAGNOSIS — I10 Essential (primary) hypertension: Secondary | ICD-10-CM | POA: Diagnosis not present

## 2022-07-28 DIAGNOSIS — Z79899 Other long term (current) drug therapy: Secondary | ICD-10-CM | POA: Insufficient documentation

## 2022-07-28 DIAGNOSIS — W25XXXA Contact with sharp glass, initial encounter: Secondary | ICD-10-CM | POA: Insufficient documentation

## 2022-07-28 MED ORDER — TETANUS-DIPHTH-ACELL PERTUSSIS 5-2.5-18.5 LF-MCG/0.5 IM SUSY
0.5000 mL | PREFILLED_SYRINGE | Freq: Once | INTRAMUSCULAR | Status: AC
  Administered 2022-07-28: 0.5 mL via INTRAMUSCULAR
  Filled 2022-07-28: qty 0.5

## 2022-07-28 MED ORDER — HYDROCODONE-ACETAMINOPHEN 5-325 MG PO TABS
1.0000 | ORAL_TABLET | Freq: Once | ORAL | Status: AC
  Administered 2022-07-28: 1 via ORAL
  Filled 2022-07-28: qty 1

## 2022-07-28 MED ORDER — CEPHALEXIN 500 MG PO CAPS: 500.0000 mg | ORAL_CAPSULE | Freq: Three times a day (TID) | ORAL | 0 refills | Status: AC

## 2022-07-28 MED ORDER — ONDANSETRON 4 MG PO TBDP
4.0000 mg | ORAL_TABLET | Freq: Once | ORAL | Status: AC
  Administered 2022-07-28: 4 mg via ORAL
  Filled 2022-07-28: qty 1

## 2022-07-28 MED ORDER — HYDROCODONE-ACETAMINOPHEN 5-325 MG PO TABS: 2.0000 | ORAL_TABLET | Freq: Four times a day (QID) | ORAL | 0 refills | Status: AC | PRN

## 2022-07-28 MED ORDER — HYDROCODONE-ACETAMINOPHEN 5-325 MG PO TABS: 2.0000 | ORAL_TABLET | ORAL | 0 refills | Status: DC | PRN

## 2022-07-28 MED ORDER — LIDOCAINE-EPINEPHRINE 1 %-1:100000 IJ SOLN
10.0000 mL | Freq: Once | INTRAMUSCULAR | Status: AC
  Administered 2022-07-28: 10 mL via INTRADERMAL
  Filled 2022-07-28: qty 1

## 2022-07-28 NOTE — ED Triage Notes (Addendum)
Patient here after falling and accidentally cutting her right forearm on a glass bottle. Patient states pain in wrist is exacerbated by closing her hand.   Patient has approximately 4 cm laceration on anterior wrist. Patient arrived to ED with wound covered tightly by paper tape and a bandage. Paper tape was removed in triage and replaced by elastic bandage. Bleeding is controlled.

## 2022-07-28 NOTE — Discharge Instructions (Addendum)
We are sending you home with a prescription for an antibiotic called Keflex.  You can take this 3 times per day for the next 7 days to prevent infection.  Please clean the wound with soap and water daily.  You do not have to leave a dressing over top, you can leave it open to air to heal.  Please make an appointment to follow-up with hand surgeon in their clinic by calling the number provided on this page.  Please be sure to do this sooner rather than later if the string sensation in your middle finger does not go away.  Have your stitches taken out in 7-10 days (11/25-11/28). This can be done at an emergency department, urgent care, or your regular doctor.  Do not take the Norco before driving or operating heavy machinery take make you tired.  Please return to the emergency department if you have inability to move your hand or fingers, fevers, pus draining from the wound, or if you have any other reason to think you need emergency care.  We hope you feel better soon.

## 2022-07-28 NOTE — ED Notes (Signed)
Pt verbalized understanding of d/c instructions, meds, and followup care. Denies questions. VSS, no distress noted. Steady gait to exit with all belongings.  ?

## 2022-07-28 NOTE — ED Provider Notes (Signed)
[facility] EMERGENCY DEPARTMENT Provider Note   CSN: [id number] Arrival date & time: 07/28/22  1500     History {Add pertinent medical, surgical, social history, OB history to HPI:1} Chief Complaint  Patient presents with   Extremity Laceration    Erika Wu is a 76 year old female with a history of hypertension who presents to the emergency department for hand laceration.  The patient states that she was trying to reach into a trash can to take a class out to put into the recycling bin, when the chair that she was standing on move and she excellently fell with her arm into the trash can.  She then fell onto her knees.  She did not hit her head or lose consciousness.  She has a small abrasion to her left knee, but that does not hurt her anymore and she is able to move the knee like normal.  Her pain is all localized to her right wrist, where she sustained 2 lacerations.  The bleeding has since stopped after she held pressure.  She is not on a blood thinner.  She does not have any changes in sensation or strength that she can detect, but is hesitant to move her wrist because of the pain.  The history is provided by the patient.       Home Medications Prior to Admission medications   Medication Sig Start Date End Date Taking? Authorizing Provider  amLODipine (NORVASC) 5 MG tablet Take 1 tablet (5 mg total) by mouth daily. 12/05/21 11/30/22  Martinique, Peter M, MD  buPROPion (WELLBUTRIN XL) 150 MG 24 hr tablet Take 450 mg by mouth every morning. 06/07/16   [provider]  carvedilol (COREG) 25 MG tablet Take 1 tablet (25 mg total) by mouth 2 (two) times daily. 11/10/21 03/28/22  Lenna Sciara, NP  famotidine (PEPCID) 40 MG tablet Take 1 tablet (40 mg total) by mouth at bedtime. 04/17/22   Vladimir Crofts, PA-C  FIBER SELECT GUMMIES PO Take by mouth.    [provider]  hydrALAZINE (APRESOLINE) 10 MG tablet Take 1 tablet (10 mg total) by mouth as needed  (as needed for systolic Blood pressures greater than 170). 11/10/21 03/28/22  Lenna Sciara, NP  hydrocortisone (ANUSOL-HC) 25 MG suppository Place 1 suppository (25 mg total) rectally daily for 7 days. Then daily as needed. 03/22/22   Thornton Park, MD  mirtazapine (REMERON) 30 MG tablet Take 30 mg by mouth at bedtime. 06/15/16   [provider]  pantoprazole (PROTONIX) 40 MG tablet Take 1 tablet (40 mg total) by mouth 2 (two) times daily before a meal. 06/12/22   Thornton Park, MD  rosuvastatin (CRESTOR) 10 MG tablet Take 1 tablet by mouth daily. 11/18/20   [provider]  Suvorexant (BELSOMRA) 20 MG TABS Take by mouth.    [provider]  valsartan (DIOVAN) 80 MG tablet Take 1 tablet (80 mg total) by mouth 2 (two) times daily. 01/25/22   Martinique, Peter M, MD  Vitamin D-Vitamin K (VITAMIN K2-VITAMIN D3) 45-2000 MCG-UNIT CAPS once a week. 10/13/19   [provider]      Allergies    Patient has no known allergies.    Review of Systems   Review of Systems   See HPI.   Physical Exam Updated Vital Signs BP 128/76 (BP Location: Left Arm)   Pulse 75   Temp 98.6 F (37 C) (Oral)   Resp 18   SpO2 95%  Physical Exam Vitals and nursing note reviewed.  Constitutional:      Appearance: She is not toxic-appearing.  HENT:     Head: Normocephalic and atraumatic.  Eyes:     Extraocular Movements: Extraocular movements intact.  Neck:     Comments: No cervical spine midline tenderness palpation. Cardiovascular:     Rate and Rhythm: Normal rate and regular rhythm.     Pulses: Normal pulses.     Heart sounds: Normal heart sounds.  Pulmonary:     Effort: Pulmonary effort is normal.     Breath sounds: Normal breath sounds.  Musculoskeletal:     Cervical back: Normal range of motion. No tenderness.     Comments: Right hand:  Limited flexion extension of right wrist secondary to pain.  After pain medication, patient able to actively flex and extend the  wrist without weakness. Full flexion and extension of all 5 digits, sensation intact on all aspects of digits, palm, and dorsum of hand and wrist. 2+ radial pulses.  Capillary refill less than 2 seconds in fingers.  Skin:    General: Skin is warm and dry.     Capillary Refill: Capillary refill takes less than 2 seconds.     Comments: 4 cm laceration to right wrist, hemostatic.   Additional 1 cm laceration to right wrist, hemostatic.   Neurological:     General: No focal deficit present.     Mental Status: She is alert and oriented to person, place, and time.     ED Results / Procedures / Treatments   Labs (all labs ordered are listed, but only abnormal results are displayed) Labs Reviewed - No data to display  EKG None  Radiology DG Wrist Complete Right  Result Date: 07/28/2022 CLINICAL DATA:  Fall.  Wrist landed on glass.  Lacerations. EXAM: RIGHT WRIST - COMPLETE 4 VIEW COMPARISON:  None Available. FINDINGS: Extensive ventral soft tissue swelling is present. No acute fractures are present. No radiopaque foreign body is present. IMPRESSION: Extensive ventral soft tissue swelling without underlying fracture or radiopaque foreign body. Electronically Signed   By: San Morelle M.D.   On: 07/28/2022 15:46    Procedures .Marland KitchenLaceration Repair  Date/Time: 07/28/2022 6:29 PM  Performed by: Renard Matter, MD Authorized by: Courtney Paris, MD   Consent:    Consent obtained:  Verbal   Consent given by:  Patient   Risks discussed:  Infection, pain and poor cosmetic result   Alternatives discussed:  No treatment and delayed treatment Universal protocol:    Procedure explained and questions answered to patient or proxy's satisfaction: yes     Test results available: yes     Imaging studies available: yes     Required blood products, implants, devices, and special equipment available: yes     Site/side marked: yes     Immediately prior to procedure, a time out was  called: yes     Patient identity confirmed:  Verbally with patient and arm band Anesthesia:    Anesthesia method:  Local infiltration   Local anesthetic:  Lidocaine 1% WITH epi Laceration details:    Location:  Shoulder/arm   Shoulder/arm location:  R lower arm   Length (cm):  4 Pre-procedure details:    Preparation:  Imaging obtained to evaluate for foreign bodies Exploration:    Imaging obtained: x-ray     Imaging outcome: foreign body not noted     Wound exploration: wound explored through full range of motion and entire depth of  wound visualized     Wound extent: no foreign body, no signs of injury, no nerve damage, no tendon damage, no underlying fracture and no vascular damage   Treatment:    Area cleansed with:  Chlorhexidine   Amount of cleaning:  Extensive   Irrigation solution:  Sterile saline   Visualized foreign bodies/material removed: no   Skin repair:    Repair method:  Sutures   Suture size:  4-0   Number of sutures:  4 Repair type:    Repair type:  Simple Post-procedure details:    Procedure completion:  Tolerated well, no immediate complications .Marland KitchenLaceration Repair  Date/Time: 07/28/2022 8:03 PM  Performed by: Renard Matter, MD Authorized by: Courtney Paris, MD   Consent:    Consent obtained:  Verbal   Consent given by:  Patient   Risks, benefits, and alternatives were discussed: yes   Universal protocol:    Imaging studies available: yes     Site/side marked: yes     Immediately prior to procedure, a time out was called: yes     Patient identity confirmed:  Verbally with patient and arm band Laceration details:    Location:  Shoulder/arm   Shoulder/arm location:  R upper arm   Length (cm):  2 Pre-procedure details:    Preparation:  Imaging obtained to evaluate for foreign bodies Exploration:    Imaging obtained: x-ray     Imaging outcome: foreign body not noted   Treatment:    Area cleansed with:  Chlorhexidine   Amount of  cleaning:  Extensive   Irrigation solution:  Sterile saline   Visualized foreign bodies/material removed: no   Skin repair:    Repair method:  Sutures   Suture size:  4-0   Suture material:  Prolene   Number of sutures:  1 Repair type:    Repair type:  Simple Post-procedure details:    Procedure completion:  Tolerated well, no immediate complications    {Document cardiac monitor, telemetry assessment procedure when appropriate:1}  Medications Ordered in ED Medications - No data to display  ED Course/ Medical Decision Making/ A&P                           Medical Decision Making Amount and/or Complexity of Data Reviewed Radiology: ordered.  Risk Prescription drug management.   Erika Wu presents with right wrist pain after accidentally cutting her wrist on glass as per above.  She is overall well-appearing and hemodynamically stable on initial exam.  Exam notable for 2 lacerations to her right wrist.  No tendon exposure or nerve visible on flexion or extension.  Differential Diagnosis:  Differential diagnosis includes superficial laceration, neurovascular injury, tendon injury, retained foreign body, fracture.  Patient is hesitant to fully extend her fingers secondary to pain, but is able to slowly do so, and sensation is intact throughout wrist, hand, and all fingers, low suspicion for nerve injury or tendon injury.  Distal pulses and capillary refill are intact, no evidence of vascular injury.No evidence of compartment syndrome including no paresthesias, pallor, diminished pulses, paralysis, or severe swelling.  Patient states that this was entirely accidental, and not an attempt at self-harm.  Patient has small bruising to left knee, but no bony tenderness palpation, palpable bony deformities, has full range of motion of the knee joint and distal pulses intact, sensation tact, no evidence of fracture, dislocation, or neurovascular injury in the leg.  Testing:  X-ray  imaging demonstrated no evidence of acute fracture or dislocation or retained foreign body on my independent review or review by radiology.   Interventions:  While in the ED, I provided the patient with tetanus booster, Norco, and Zofran.  I performed a laceration repair, see procedure note above.  The wounds were both explored to their depth without obvious tendon, nerve, or vascular involvement.  A total of 5 stitches were placed.  Reassessment:  On reassessment, the patient remains hemodynamically stable and overall well-appearing.  Patient instructed to seek follow-up in 7 to 10 days for stitch removal.  I also instructed her to follow-up with hand surgery in their clinic given burning sensation in her middle finger, though as there are no sensation or strength deficits on exam I have a low suspicion for a true nerve injury, feel that it was most likely secondary to irritation.  Final Clinical Impression: ***  Post-ED Care:  Given the patient's reassuring presentation, I believe that Erika Wu is safe for discharge.  I provided ED return precautions, specifically for the symptoms which are most concerning for neurovascular compromise and joint infection, which would necessitate immediate return.  I encouraged the patient to followup with their PCP within 2-3 days for repeat examination. *** voiced understanding of and agreement with this plan. Patient discharged home in stable condition.   I saw this patient in conjunction with my attending, Dr. ***, who provided oversight and voiced agreement with this assessment and plan.    {Document critical care time when appropriate:1} {Document review of labs and clinical decision tools ie heart score, Chads2Vasc2 etc:1}  {Document your independent review of radiology images, and any outside records:1} {Document your discussion with family members, caretakers, and with consultants:1} {Document social determinants of health affecting pt's  care:1} {Document your decision making why or why not admission, treatments were needed:1} Final Clinical Impression(s) / ED Diagnoses Final diagnoses:  None    Rx / DC Orders ED Discharge Orders     None

## 2022-07-31 ENCOUNTER — Telehealth: Payer: Self-pay | Admitting: Cardiology

## 2022-07-31 ENCOUNTER — Telehealth: Payer: Self-pay | Admitting: *Deleted

## 2022-07-31 ENCOUNTER — Other Ambulatory Visit: Payer: Self-pay | Admitting: Orthopedic Surgery

## 2022-07-31 NOTE — Telephone Encounter (Signed)
I s/w the pt and she has been scheduled for 08/06/22 @ 3:20. Med rec and consent are done. . Pt states she is HOH and to speak slowly and loud.

## 2022-07-31 NOTE — Telephone Encounter (Signed)
Left message for the pt to call back to set up tele pre op appt 

## 2022-07-31 NOTE — Telephone Encounter (Signed)
Patient was returning call 

## 2022-07-31 NOTE — Telephone Encounter (Signed)
   Pre-operative Risk Assessment    Patient Name: Erika Wu  DOB: 1945-09-26 MRN: [id number]      Request for Surgical Clearance    Procedure:   Exploration Repair of tendons, arteries, and nerves right wrist   Date of Surgery:  Clearance 08/09/22                                 Surgeon:  Dr. [name]  Surgeon's Group or Practice Name:  [facility]  Phone number:  [phone] Fax number:  [phone]   Type of Clearance Requested:   - Medical    Type of Anesthesia:   choice    Additional requests/questions:    Dorthey Sawyer   07/31/2022, 12:45 PM

## 2022-07-31 NOTE — Telephone Encounter (Signed)
Pt returning nurse's call. Please advise

## 2022-07-31 NOTE — Telephone Encounter (Signed)
I returned the pt's call and left message.

## 2022-07-31 NOTE — Telephone Encounter (Signed)
   Name: Erika Wu  DOB: Oct 08, 1945  MRN: [id number]  Primary Cardiologist: Peter Martinique, MD   Preoperative team, please contact this patient and set up a phone call appointment for further preoperative risk assessment. Please obtain consent and complete medication review. Thank you for your help.  I confirm that guidance regarding antiplatelet and oral anticoagulation therapy has been completed and, if necessary, noted below.(None requested   Deberah Pelton, NP 07/31/2022, 2:07 PM [place]

## 2022-08-01 ENCOUNTER — Telehealth: Payer: Self-pay | Admitting: *Deleted

## 2022-08-01 NOTE — Telephone Encounter (Signed)
Hassan Rowan from Dr. Levell July office called to let Arbie Cookey know of the surgery being moved to the [facility] on 11/07/00

## 2022-08-01 NOTE — Progress Notes (Signed)
Chart reviewed by Dr. Lissa Hoard and states patient should be moved to Main OR. Message left for [facility] at Dr. Levell July office.

## 2022-08-03 ENCOUNTER — Encounter (HOSPITAL_COMMUNITY): Payer: Self-pay | Admitting: Orthopedic Surgery

## 2022-08-03 ENCOUNTER — Other Ambulatory Visit: Payer: Self-pay

## 2022-08-03 NOTE — Progress Notes (Signed)
PCP - Mickeal Needy, Jacalyn Lefevre, MD Cardiologist - Martinique, Peter, MD  PPM/ICD - denies  Chest x-ray - n/a EKG - 03/28/22 Stress Test - 07/13/16 ECHO - 03/07/22 Cardiac Cath - 07/24/16  CPAP - n/a  Fasting Blood Sugar - n/a  Blood Thinner Instructions: n/a Aspirin Instructions: Patient was instructed: As of today, STOP taking any Aspirin (unless otherwise instructed by your surgeon) Aleve, Naproxen, Ibuprofen, Motrin, Advil, Goody's, BC's, all herbal medications, fish oil, and all vitamins.  ERAS Protcol - yes, until 09"30 o'clock  COVID TEST- n/a  Anesthesia review: yes - cardiac history  Patient verbally denies any shortness of breath, fever, cough and chest pain during phone call   -------------  SDW INSTRUCTIONS given:  Your procedure is scheduled on Tuesday, November 28th, 2023.  Report to [facility] Main Entrance "A" at 10:00 A.M., and check in at the Admitting office.  Call this number if you have problems the morning of surgery:  [phone]   Remember:  Do not eat after midnight the night before your surgery  You may drink clear liquids until 09:30 the morning of your surgery.   Clear liquids allowed are: Water, Non-Citrus Juices (without pulp), Carbonated Beverages, Clear Tea, Black Coffee Only, and Gatorade    Take these medicines the morning of surgery with A SIP OF WATER: Amlodipine, Bupropion, Coreg, Keflex, Protonix, Crestor  As of today, STOP taking any Aspirin (unless otherwise instructed by your surgeon) Aleve, Naproxen, Ibuprofen, Motrin, Advil, Goody's, BC's, all herbal medications, fish oil, and all vitamins.   The day of surgery:                    Do not wear jewelry, make up, or nail polish            Do not wear lotions, powders, perfumes, or deodorant.            Do not shave 48 hours prior to surgery.              Do not bring valuables to the hospital.            [facility] is not responsible for any belongings or valuables.  Do NOT Smoke  (Tobacco/Vaping) 24 hours prior to your procedure If you use a CPAP at night, you may bring all equipment for your overnight stay.   Contacts, glasses, dentures or bridgework may not be worn into surgery.      For patients admitted to the hospital, discharge time will be determined by your treatment team.   Patients discharged the day of surgery will not be allowed to drive home, and someone needs to stay with them for 24 hours.    Special instructions:   [place]- Preparing For Surgery  Before surgery, you can play an important role. Because skin is not sterile, your skin needs to be as free of germs as possible. You can reduce the number of germs on your skin by washing with CHG (chlorahexidine gluconate) Soap before surgery.  CHG is an antiseptic cleaner which kills germs and bonds with the skin to continue killing germs even after washing.    Oral Hygiene is also important to reduce your risk of infection.  Remember - BRUSH YOUR TEETH THE MORNING OF SURGERY WITH YOUR REGULAR TOOTHPASTE  Please do not use if you have an allergy to CHG or antibacterial soaps. If your skin becomes reddened/irritated stop using the CHG.  Do not shave (including legs and underarms) for  at least 48 hours prior to first CHG shower. It is OK to shave your face.  Please follow these instructions carefully.   Shower the NIGHT BEFORE SURGERY and the MORNING OF SURGERY with DIAL Soap.   Pat yourself dry with a CLEAN TOWEL.  Wear CLEAN PAJAMAS to bed the night before surgery  Place CLEAN SHEETS on your bed the night of your first shower and DO NOT SLEEP WITH PETS.   Day of Surgery: Please shower morning of surgery  Wear Clean/Comfortable clothing the morning of surgery Do not apply any deodorants/lotions.   Remember to brush your teeth WITH YOUR REGULAR TOOTHPASTE.   Questions were answered. Patient verbalized understanding of instructions.

## 2022-08-06 ENCOUNTER — Ambulatory Visit: Payer: Medicare PPO | Attending: Cardiology | Admitting: Physician Assistant

## 2022-08-06 DIAGNOSIS — Z01810 Encounter for preprocedural cardiovascular examination: Secondary | ICD-10-CM | POA: Diagnosis not present

## 2022-08-06 NOTE — Anesthesia Preprocedure Evaluation (Signed)
Anesthesia Evaluation  Patient identified by MRN, date of birth, ID band Patient awake    Reviewed: Allergy & Precautions, NPO status , Patient's Chart, lab work & pertinent test results  Airway Mallampati: II  TM Distance: >3 FB Neck ROM: Full    Dental no notable dental hx.    Pulmonary former smoker   Pulmonary exam normal        Cardiovascular hypertension, Pt. on medications and Pt. on home beta blockers + Valvular Problems/Murmurs AI  Rhythm:Regular Rate:Normal     Neuro/Psych   Anxiety Depression       GI/Hepatic Neg liver ROS,GERD  Medicated,,  Endo/Other  negative endocrine ROS    Renal/GU negative Renal ROS  negative genitourinary   Musculoskeletal  (+) Arthritis , Osteoarthritis,    Abdominal Normal abdominal exam  (+)   Peds  Hematology negative hematology ROS (+)   Anesthesia Other Findings   Reproductive/Obstetrics                              Anesthesia Physical Anesthesia Plan  ASA: 3  Anesthesia Plan: MAC and Regional   Post-op Pain Management: Regional block*   Induction: Intravenous  PONV Risk Score and Plan: 2 and Ondansetron, Dexamethasone, Treatment may vary due to age or medical condition and Propofol infusion  Airway Management Planned: Simple Face Mask, Natural Airway and Nasal Cannula  Additional Equipment: None  Intra-op Plan:   Post-operative Plan:   Informed Consent: I have reviewed the patients History and Physical, chart, labs and discussed the procedure including the risks, benefits and alternatives for the proposed anesthesia with the patient or authorized representative who has indicated his/her understanding and acceptance.     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  Echo 03/07/2022 showed normal LV function with severe basal septal hypertrophy, severe aortic regurgitation, moderate aortic stenosis (mean gradient 28 mmHg). Seen by Fabian Sharp, PA-C 08/06/22 for preop eval. Per note, "She does not have a history of ischemic heart disease, PCI, or stroke. She does not require insulin and has a creatinine below 2.0. She reports activity equivalent to at least 4.0 METS. According to the RCRI, she has a 0.4% risk of MACE. However, she has severe AI that has been followed with serial echos. She remains asymptomatic. She also has a thoracic aortic aneurysm that has been stable on repeat imaging, followed by Dr. Roxan Hockey. She unfortunately suffered an injury to her wrist that requires surgery to hopefully alleviate pain. Given her AI, I spoke with Dr. Martinique who agreed she may proceed with wrist surgery without additional testing. She will keep her appt for her echo on 08/15/22."  Follows with cardiothoracic surgery for history of ascending aortic aneurysm.  Last seen by Dr. Roxan Hockey 04/03/2022.  Aneurysm noted to be stable at 4.5 cm.  No significant change.  Recommended continued semiannual follow-up.  Pt will need DOS labs and eval.  EKG 03/28/22: Sinus bradycardia. LVH with QRS widening. Inferior infarct, age undetermined.  CTA chest 03/12/2022: IMPRESSION: 1. Stable aneurysmal ascending thoracic aorta (4.5 cm). Ascending thoracic aortic aneurysm. Recommend semi-annual imaging followup by CTA or MRA and referral to cardiothoracic surgery if not already obtained. This recommendation follows 2010 ACCF/AHA/AATS/ACR/ASA/SCA/SCAI/SIR/STS/SVM Guidelines for the Diagnosis  and Management of Patients With Thoracic Aortic Disease. Circulation. 2010; 121: M628-M381. Aortic aneurysm NOS (ICD10-I71.9). 2. Aortic Atherosclerosis (ICD10-I70.0) with at least 2 vessel coronary  calcifications. 3. No acute intrapulmonary abnormality.  TTE 03/07/2022:  1. Left ventricular ejection fraction, by estimation, is 60 to 65%. The  left ventricle has normal function. The left ventricle has no regional  wall motion abnormalities. The left ventricular internal cavity size was  mildly dilated. There is severe  concentric left ventricular hypertrophy of the basal-septal segment. Left  ventricular diastolic parameters are consistent with Grade I diastolic  dysfunction (impaired relaxation).   2. Right ventricular systolic function is normal. The right ventricular  size is normal. There is normal pulmonary artery systolic pressure. The  estimated right ventricular systolic pressure is 77.1 mmHg.   3. The mitral valve is degenerative. Mild mitral valve regurgitation. No  evidence of mitral stenosis.   4. The aortic valve is abnormal. Aortic valve regurgitation is severe.  Aortic valve area, by VTI measures 1.29 cm, Aortic valve mean gradient  measures 28.0 mmHg, Aortic valve Vmax measures 3.83 m/s, DVI is 0.41 but  visually the AV does not appear  stenotic. 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 Cannot get accurate waveforms to demonstrate LVOT  gradient compared to AV gradient. The AV   has been reported as moderate AS in the past but in the M Mode and  visually on 2D images the leaflets appear to open well with some AV  sclerosis. Suspect that increased AV gradient and peak velocities are due  in part to increased flow across AV from AI  as well as increased velocity in LVOT from severe [place].   5. Aortic dilatation noted. Aneurysm of the ascending aorta, measuring 45  mm.   6. The inferior vena cava is normal in size with greater than 50%  respiratory variability, suggesting right atrial pressure of 3 mmHg.   7. Recommend TEE to get planimetry of the AV for  accurate measurement of  the AVA as well as assess severity of AI further given LV dilatation.    )         Anesthesia Quick Evaluation

## 2022-08-06 NOTE — Progress Notes (Signed)
Virtual Visit via Telephone Note   Because of Erika Wu's co-morbid illnesses, she is at least at moderate risk for complications without adequate follow up.  This format is felt to be most appropriate for this patient at this time.  The patient did not have access to video technology/had technical difficulties with video requiring transitioning to audio format only (telephone).  All issues noted in this document were discussed and addressed.  No physical exam could be performed with this format.  Please refer to the patient's chart for her consent to telehealth for [name].  Evaluation Performed:  Preoperative cardiovascular risk assessment _____________   Date:  08/06/2022   Patient ID:  Erika Wu Oct 05, 1945, MRN [id number] Patient Location:  Home Provider location:   Office  Primary Care Provider:  Melida Quitter, MD Primary Cardiologist:  Peter Swaziland, MD  Chief Complaint / Patient Profile   76 y.o. 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  Past Medical History    Past Medical History:  Diagnosis Date   Allergy    Anxiety    Arthritis    Chest pain    a. 03/2015 Lexiscan MV: EF 59%, no ischemia/infarct.   Colon polyps    COVID-19 virus infection    09-22-2020, fully vax'd    Depression    GERD (gastroesophageal reflux disease)    Heart murmur    Hemorrhoids    Hyperlipidemia    Hypertension    Moderate aortic insufficiency    a. 03/2015 Echo: EF 55-60%, no rwma, mild LVH, Gr 1 DD, mild AS, mod AI (bicuspid AoV by report, poorly visualized on this study), Asc Ao diam 44mm, triv MR.   Past Surgical  History:  Procedure Laterality Date   CARDIAC CATHETERIZATION N/A 07/24/2016   Procedure: Left Heart Cath and Coronary Angiography;  Surgeon: Peter M Swaziland, MD;  Location: [facility] INVASIVE CV LAB;  Service: Cardiovascular;  Laterality: N/A;   COLONOSCOPY     POLYPECTOMY     TONSILLECTOMY AND ADENOIDECTOMY     TUBAL LIGATION      Allergies  No Known Allergies  History of Present Illness    Erika Wu is a 76 y.o. female who presents via audio/video conferencing for a telehealth visit today.  Pt was last seen in cardiology clinic on 03/28/22 by Dr. Swaziland.  At that time Erika Wu was doing well. He noted she was not yet symptomatic with severe AI by echo 03/07/22.  The patient is now pending procedure as outlined above. Since her last visit, she remains stable from a cardiac perspective. She denies SOB, DOE, lower extremity, and chest pain. She is able to complete 4.0 METS by moderate house work (floors) and grocery shopping (walks the aisles). She unfortunately had an accident with injury to her wrist and is a significant amount of pain prompting surgery on Friday.   Home Medications    Prior to Admission medications   Medication Sig Start Date End Date Taking? Authorizing Provider  amLODipine (NORVASC) 5 MG tablet Take 1 tablet (5 mg total) by mouth daily. 12/05/21 11/30/22  Swaziland, Peter M, MD  buPROPion (WELLBUTRIN XL) 150 MG  24 hr tablet Take 450 mg by mouth every morning. 06/07/16   [provider]  carvedilol (COREG) 25 MG tablet Take 1 tablet (25 mg total) by mouth 2 (two) times daily. 11/10/21 07/31/22  Joylene Grapes, NP  famotidine (PEPCID) 40 MG tablet Take 1 tablet (40 mg total) by mouth at bedtime. 04/17/22   Doree Albee, PA-C  hydrALAZINE (APRESOLINE) 10 MG tablet Take 1 tablet (10 mg total) by mouth as needed (as needed for systolic Blood pressures greater than 170). Patient not taking: Reported on 07/31/2022 11/10/21 07/28/22  Joylene Grapes, NP  hydrocortisone  (ANUSOL-HC) 25 MG suppository Place 1 suppository (25 mg total) rectally daily for 7 days. Then daily as needed. Patient not taking: Reported on 07/28/2022 03/22/22   Tressia Danas, MD  mirtazapine (REMERON) 30 MG tablet Take 30 mg by mouth at bedtime. 06/15/16   [provider]  pantoprazole (PROTONIX) 40 MG tablet Take 1 tablet (40 mg total) by mouth 2 (two) times daily before a meal. 06/12/22   Tressia Danas, MD  rosuvastatin (CRESTOR) 10 MG tablet Take 1 tablet by mouth daily. 11/18/20   [provider]  Suvorexant (BELSOMRA) 20 MG TABS Take 20 mg by mouth daily.    [provider]  valsartan (DIOVAN) 80 MG tablet Take 1 tablet (80 mg total) by mouth 2 (two) times daily. 01/25/22   Swaziland, Peter M, MD    Physical Exam    Vital Signs:  Erika Wu does not have vital signs available for review today.  Given telephonic nature of communication, physical exam is limited. AAOx3. NAD. Normal affect.  Speech and respirations are unlabored.  Accessory Clinical Findings    None  Assessment & Plan    1.  Preoperative Cardiovascular Risk Assessment:  She does not have a history of ischemic heart disease, PCI, or stroke. She does not require insulin and has a creatinine below 2.0. She reports activity equivalent to at least 4.0 METS. According to the RCRI, she has a 0.4% risk of MACE. However, she has severe AI that has been followed with serial echos. She remains asymptomatic. She also has a thoracic aortic aneurysm that has been stable on repeat imaging, followed by Dr. Dorris Fetch. She unfortunately suffered an injury to her wrist that requires surgery to hopefully alleviate pain. Given her AI, I spoke with Dr. Swaziland who agreed she may proceed with wrist surgery without additional testing. She will keep her appt for her echo on 08/15/22.  The patient was advised that if she develops new symptoms prior to surgery to contact our office to arrange for a follow-up  visit, and she verbalized understanding.  Given her AI, would be cautious with IVF during surgery. If surgery can be done with block instead of general anesthesia, this may lessen her risk of cardiac complications, but will defer to anesthesiologist.   A copy of this note will be routed to requesting surgeon.  Time:   Today, I have spent 15 minutes with the patient with telehealth technology discussing medical history, symptoms, and management plan.     Roe Rutherford Guinevere Stephenson, PA  08/06/2022, 4:25 PM

## 2022-08-06 NOTE — Progress Notes (Signed)
Anesthesia Chart Review: Same day workup  Follows with cardiology for history of HTN, HLD, PSVT, nonobstructive CAD (cath 2017 with mild nonobstructive disease), mild to moderate aortic stenosis, severe aortic insufficiency.  Echo 03/07/2022 showed normal LV function with severe basal septal hypertrophy, severe aortic regurgitation, moderate aortic stenosis (mean gradient 28 mmHg). Seen by Fabian Sharp, PA-C 08/06/22 for preop eval. Per note, "She does not have a history of ischemic heart disease, PCI, or stroke. She does not require insulin and has a creatinine below 2.0. She reports activity equivalent to at least 4.0 METS. According to the RCRI, she has a 0.4% risk of MACE. However, she has severe AI that has been followed with serial echos. She remains asymptomatic. She also has a thoracic aortic aneurysm that has been stable on repeat imaging, followed by Dr. Roxan Hockey. She unfortunately suffered an injury to her wrist that requires surgery to hopefully alleviate pain. Given her AI, I spoke with Dr. Martinique who agreed she may proceed with wrist surgery without additional testing. She will keep her appt for her echo on 08/15/22."  Follows with cardiothoracic surgery for history of ascending aortic aneurysm.  Last seen by Dr. Roxan Hockey 04/03/2022.  Aneurysm noted to be stable at 4.5 cm.  No significant change.  Recommended continued semiannual follow-up.  Pt will need DOS labs and eval.  EKG 03/28/22: Sinus bradycardia. LVH with QRS widening. Inferior infarct, age undetermined.  CTA chest 03/12/2022: IMPRESSION: 1. Stable aneurysmal ascending thoracic aorta (4.5 cm). Ascending thoracic aortic aneurysm. Recommend semi-annual imaging followup by CTA or MRA and referral to cardiothoracic surgery if not already obtained. This recommendation follows 2010 ACCF/AHA/AATS/ACR/ASA/SCA/SCAI/SIR/STS/SVM Guidelines for the Diagnosis and Management of Patients With Thoracic Aortic Disease. Circulation. 2010;  121: I948-N462. Aortic aneurysm NOS (ICD10-I71.9). 2. Aortic Atherosclerosis (ICD10-I70.0) with at least 2 vessel coronary calcifications. 3. No acute intrapulmonary abnormality.  TTE 03/07/2022:  1. Left ventricular ejection fraction, by estimation, is 60 to 65%. The  left ventricle has normal function. The left ventricle has no regional  wall motion abnormalities. The left ventricular internal cavity size was  mildly dilated. There is severe  concentric left ventricular hypertrophy of the basal-septal segment. Left  ventricular diastolic parameters are consistent with Grade I diastolic  dysfunction (impaired relaxation).   2. Right ventricular systolic function is normal. The right ventricular  size is normal. There is normal pulmonary artery systolic pressure. The  estimated right ventricular systolic pressure is 70.3 mmHg.   3. The mitral valve is degenerative. Mild mitral valve regurgitation. No  evidence of mitral stenosis.   4. The aortic valve is abnormal. Aortic valve regurgitation is severe.  Aortic valve area, by VTI measures 1.29 cm, Aortic valve mean gradient  measures 28.0 mmHg, Aortic valve Vmax measures 3.83 m/s, DVI is 0.41 but  visually the AV does not appear  stenotic. 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 Cannot get accurate waveforms to demonstrate LVOT  gradient compared to AV gradient. The AV   has been reported as moderate AS in the past but in the M Mode and  visually on 2D images the leaflets appear to open well with some AV  sclerosis. Suspect that increased AV gradient and peak velocities are due  in part to increased flow across AV from AI  as well as increased velocity in LVOT from severe [place].   5. Aortic dilatation noted.  Aneurysm of the ascending aorta, measuring 45  mm.   6. The inferior vena cava is normal in size with greater than  50%  respiratory variability, suggesting right atrial pressure of 3 mmHg.   7. Recommend TEE to get planimetry of the AV for accurate measurement of  the AVA as well as assess severity of AI further given LV dilatation.     Erika Wu [facility] Stay [facility]/Anesthesiology Phone [phone] 08/06/2022 8:32 PM

## 2022-08-06 NOTE — Telephone Encounter (Signed)
Noted, keep plan for tele visit.

## 2022-08-06 NOTE — Telephone Encounter (Signed)
Pt has a tele pre op appt today @ 3:20. I will update the pre op team that the surgery has been moved to 11/07/22, which will be 3 months from now.

## 2022-08-07 ENCOUNTER — Other Ambulatory Visit: Payer: Self-pay

## 2022-08-07 ENCOUNTER — Ambulatory Visit (HOSPITAL_BASED_OUTPATIENT_CLINIC_OR_DEPARTMENT_OTHER): Payer: Medicare PPO | Admitting: Anesthesiology

## 2022-08-07 ENCOUNTER — Ambulatory Visit (HOSPITAL_COMMUNITY)
Admission: RE | Admit: 2022-08-07 | Discharge: 2022-08-07 | Disposition: A | Discharge status: Home / Self Care | Payer: Medicare PPO | Attending: Orthopedic Surgery | Admitting: Orthopedic Surgery

## 2022-08-07 ENCOUNTER — Encounter (HOSPITAL_COMMUNITY)
Admission: RE | Disposition: A | Discharge status: Home / Self Care | Payer: Self-pay | Source: Home / Self Care | Attending: Orthopedic Surgery

## 2022-08-07 ENCOUNTER — Encounter (HOSPITAL_COMMUNITY): Payer: Self-pay | Admitting: Orthopedic Surgery

## 2022-08-07 ENCOUNTER — Ambulatory Visit (HOSPITAL_COMMUNITY): Payer: Medicare PPO | Admitting: Anesthesiology

## 2022-08-07 DIAGNOSIS — Z87891 Personal history of nicotine dependence: Secondary | ICD-10-CM | POA: Diagnosis not present

## 2022-08-07 DIAGNOSIS — S6411XA Injury of median nerve at wrist and hand level of right arm, initial encounter: Secondary | ICD-10-CM | POA: Insufficient documentation

## 2022-08-07 DIAGNOSIS — S6411XD Injury of median nerve at wrist and hand level of right arm, subsequent encounter: Secondary | ICD-10-CM | POA: Diagnosis not present

## 2022-08-07 DIAGNOSIS — I10 Essential (primary) hypertension: Secondary | ICD-10-CM | POA: Diagnosis not present

## 2022-08-07 DIAGNOSIS — K219 Gastro-esophageal reflux disease without esophagitis: Secondary | ICD-10-CM | POA: Insufficient documentation

## 2022-08-07 DIAGNOSIS — X58XXXA Exposure to other specified factors, initial encounter: Secondary | ICD-10-CM | POA: Diagnosis not present

## 2022-08-07 DIAGNOSIS — F418 Other specified anxiety disorders: Secondary | ICD-10-CM | POA: Diagnosis not present

## 2022-08-07 HISTORY — PX: NERVE, TENDON AND ARTERY REPAIR: SHX5695

## 2022-08-07 HISTORY — PX: WOUND EXPLORATION: SHX6188

## 2022-08-07 HISTORY — DX: Unspecified osteoarthritis, unspecified site: M19.90

## 2022-08-07 LAB — CBC
HCT: 38 % (ref 36.0–46.0)
Hemoglobin: 12.8 g/dL (ref 12.0–15.0)
MCH: 32 pg (ref 26.0–34.0)
MCHC: 33.7 g/dL (ref 30.0–36.0)
MCV: 95 fL (ref 80.0–100.0)
Platelets: 191 10*3/uL (ref 150–400)
RBC: 4 MIL/uL (ref 3.87–5.11)
RDW: 12.1 % (ref 11.5–15.5)
WBC: 6.2 10*3/uL (ref 4.0–10.5)
nRBC: 0 % (ref 0.0–0.2)

## 2022-08-07 LAB — BASIC METABOLIC PANEL
Anion gap: 9 (ref 5–15)
BUN: 8 mg/dL (ref 8–23)
CO2: 21 mmol/L — ABNORMAL LOW (ref 22–32)
Calcium: 8.7 mg/dL — ABNORMAL LOW (ref 8.9–10.3)
Chloride: 110 mmol/L (ref 98–111)
Creatinine, Ser: 0.69 mg/dL (ref 0.44–1.00)
GFR, Estimated: >60 mL/min (ref >60)
Glucose, Bld: 90 mg/dL (ref 70–99)
Potassium: 3.7 mmol/L (ref 3.5–5.1)
Sodium: 140 mmol/L (ref 135–145)

## 2022-08-07 SURGERY — WOUND EXPLORATION
Anesthesia: Monitor Anesthesia Care | Site: Wrist | Laterality: Right

## 2022-08-07 MED ORDER — FENTANYL CITRATE (PF) 100 MCG/2ML IJ SOLN: 25.0000 ug | INTRAMUSCULAR | Status: DC | PRN

## 2022-08-07 MED ORDER — FENTANYL CITRATE (PF) 100 MCG/2ML IJ SOLN
INTRAMUSCULAR | Status: AC
  Administered 2022-08-07: 50 ug via INTRAVENOUS
  Filled 2022-08-07: qty 2

## 2022-08-07 MED ORDER — FENTANYL CITRATE (PF) 100 MCG/2ML IJ SOLN: 50.0000 ug | Freq: Once | INTRAMUSCULAR | Status: AC

## 2022-08-07 MED ORDER — ONDANSETRON HCL 4 MG/2ML IJ SOLN
INTRAMUSCULAR | Status: AC
  Filled 2022-08-07: qty 2

## 2022-08-07 MED ORDER — PHENYLEPHRINE HCL-NACL 20-0.9 MG/250ML-% IV SOLN
INTRAVENOUS | Status: DC | PRN
  Administered 2022-08-07: 25 ug/min via INTRAVENOUS

## 2022-08-07 MED ORDER — DEXMEDETOMIDINE HCL IN NACL 80 MCG/20ML IV SOLN
INTRAVENOUS | Status: DC | PRN
  Administered 2022-08-07 (×4): 4 ug via BUCCAL

## 2022-08-07 MED ORDER — ACETAMINOPHEN 10 MG/ML IV SOLN: 1000.0000 mg | Freq: Once | INTRAVENOUS | Status: DC | PRN

## 2022-08-07 MED ORDER — DEXAMETHASONE SODIUM PHOSPHATE 10 MG/ML IJ SOLN
INTRAMUSCULAR | Status: DC | PRN
  Administered 2022-08-07: 5 mg

## 2022-08-07 MED ORDER — PHENYLEPHRINE 80 MCG/ML (10ML) SYRINGE FOR IV PUSH (FOR BLOOD PRESSURE SUPPORT)
PREFILLED_SYRINGE | INTRAVENOUS | Status: DC | PRN
  Administered 2022-08-07: 80 ug via INTRAVENOUS

## 2022-08-07 MED ORDER — ROPIVACAINE HCL 5 MG/ML IJ SOLN
INTRAMUSCULAR | Status: DC | PRN
  Administered 2022-08-07: 25 mL via PERINEURAL

## 2022-08-07 MED ORDER — LACTATED RINGERS IV SOLN: INTRAVENOUS | Status: DC

## 2022-08-07 MED ORDER — PROPOFOL 10 MG/ML IV BOLUS
INTRAVENOUS | Status: AC
  Filled 2022-08-07: qty 20

## 2022-08-07 MED ORDER — LIDOCAINE 2% (20 MG/ML) 5 ML SYRINGE
INTRAMUSCULAR | Status: DC | PRN
  Administered 2022-08-07: 40 mg via INTRAVENOUS

## 2022-08-07 MED ORDER — PROPOFOL 10 MG/ML IV BOLUS
INTRAVENOUS | Status: DC | PRN
  Administered 2022-08-07: 20 mg via INTRAVENOUS

## 2022-08-07 MED ORDER — CEFAZOLIN SODIUM-DEXTROSE 2-4 GM/100ML-% IV SOLN
2.0000 g | INTRAVENOUS | Status: AC
  Administered 2022-08-07: 2 g via INTRAVENOUS
  Filled 2022-08-07: qty 100

## 2022-08-07 MED ORDER — CHLORHEXIDINE GLUCONATE 0.12 % MT SOLN: 15.0000 mL | Freq: Once | OROMUCOSAL | Status: AC

## 2022-08-07 MED ORDER — MIDAZOLAM HCL 2 MG/2ML IJ SOLN: 1.0000 mg | Freq: Once | INTRAMUSCULAR | Status: AC

## 2022-08-07 MED ORDER — STERILE WATER FOR IRRIGATION IR SOLN
Status: DC | PRN
  Administered 2022-08-07: 1000 mL

## 2022-08-07 MED ORDER — TRAMADOL HCL 50 MG PO TABS: ORAL_TABLET | ORAL | 0 refills | Status: DC

## 2022-08-07 MED ORDER — ORAL CARE MOUTH RINSE: 15.0000 mL | Freq: Once | OROMUCOSAL | Status: AC

## 2022-08-07 MED ORDER — PROPOFOL 500 MG/50ML IV EMUL
INTRAVENOUS | Status: DC | PRN
  Administered 2022-08-07: 75 ug/kg/min via INTRAVENOUS

## 2022-08-07 MED ORDER — ONDANSETRON HCL 4 MG/2ML IJ SOLN
INTRAMUSCULAR | Status: DC | PRN
  Administered 2022-08-07: 4 mg via INTRAVENOUS

## 2022-08-07 MED ORDER — PROPOFOL 1000 MG/100ML IV EMUL
INTRAVENOUS | Status: AC
  Filled 2022-08-07: qty 100

## 2022-08-07 MED ORDER — 0.9 % SODIUM CHLORIDE (POUR BTL) OPTIME
TOPICAL | Status: DC | PRN
  Administered 2022-08-07: 1000 mL

## 2022-08-07 MED ORDER — DEXAMETHASONE SODIUM PHOSPHATE 10 MG/ML IJ SOLN
INTRAMUSCULAR | Status: DC | PRN
  Administered 2022-08-07: 5 mg via INTRAVENOUS

## 2022-08-07 MED ORDER — MIDAZOLAM HCL 2 MG/2ML IJ SOLN
INTRAMUSCULAR | Status: AC
  Administered 2022-08-07: 1 mg via INTRAVENOUS
  Filled 2022-08-07: qty 2

## 2022-08-07 MED ORDER — CHLORHEXIDINE GLUCONATE 0.12 % MT SOLN
OROMUCOSAL | Status: AC
  Administered 2022-08-07: 15 mL via OROMUCOSAL
  Filled 2022-08-07: qty 15

## 2022-08-07 MED ORDER — LIDOCAINE 2% (20 MG/ML) 5 ML SYRINGE
INTRAMUSCULAR | Status: AC
  Filled 2022-08-07: qty 5

## 2022-08-07 MED ORDER — DEXAMETHASONE SODIUM PHOSPHATE 10 MG/ML IJ SOLN
INTRAMUSCULAR | Status: AC
  Filled 2022-08-07: qty 1

## 2022-08-07 SURGICAL SUPPLY — 50 items
BAG COUNTER SPONGE SURGICOUNT (BAG) ×1 IMPLANT
BAG DECANTER FOR FLEXI CONT (MISCELLANEOUS) IMPLANT
BLADE MINI RND TIP GREEN BEAV (BLADE) IMPLANT
BLADE SURG 15 STRL LF DISP TIS (BLADE) ×2 IMPLANT
BLADE SURG 15 STRL SS (BLADE) ×2
BNDG ELASTIC 3X5.8 VLCR STR LF (GAUZE/BANDAGES/DRESSINGS) ×1 IMPLANT
BNDG ESMARK 4X9 LF (GAUZE/BANDAGES/DRESSINGS) IMPLANT
BNDG GAUZE DERMACEA FLUFF 4 (GAUZE/BANDAGES/DRESSINGS) ×1 IMPLANT
CANISTER SUCT 3000ML PPV (MISCELLANEOUS) ×1 IMPLANT
CHLORAPREP W/TINT 26 (MISCELLANEOUS) ×1 IMPLANT
CORD BIPOLAR FORCEPS 12FT (ELECTRODE) ×1 IMPLANT
COVER MAYO STAND STRL (DRAPES) ×1 IMPLANT
COVER SURGICAL LIGHT HANDLE (MISCELLANEOUS) ×1 IMPLANT
CUFF TOURN SGL QUICK 18 NS (TOURNIQUET CUFF) IMPLANT
CUFF TOURN SGL QUICK 18X4 (TOURNIQUET CUFF) IMPLANT
DRAPE MICROSCOPE LEICA (MISCELLANEOUS) IMPLANT
DRAPE SURG 17X23 STRL (DRAPES) ×1 IMPLANT
GAUZE SPONGE 4X4 12PLY STRL (GAUZE/BANDAGES/DRESSINGS) ×1 IMPLANT
GAUZE XEROFORM 1X8 LF (GAUZE/BANDAGES/DRESSINGS) ×1 IMPLANT
GLOVE BIO SURGEON STRL SZ7.5 (GLOVE) ×1 IMPLANT
GLOVE BIOGEL PI IND STRL 8 (GLOVE) ×1 IMPLANT
GLOVE SKINSENSE STRL SZ7.0 (GLOVE) IMPLANT
GOWN STRL REUS W/ TWL LRG LVL3 (GOWN DISPOSABLE) ×1 IMPLANT
GOWN STRL REUS W/ TWL XL LVL3 (GOWN DISPOSABLE) ×1 IMPLANT
GOWN STRL REUS W/TWL LRG LVL3 (GOWN DISPOSABLE) ×1
GOWN STRL REUS W/TWL XL LVL3 (GOWN DISPOSABLE) ×1
KIT BASIN OR (CUSTOM PROCEDURE TRAY) ×1 IMPLANT
LOOP VESSEL MAXI BLUE (MISCELLANEOUS) IMPLANT
NDL 18GX1X1/2 (RX/OR ONLY) (NEEDLE) IMPLANT
NDL HYPO 25X1 1.5 SAFETY (NEEDLE) IMPLANT
NEEDLE 18GX1X1/2 (RX/OR ONLY) (NEEDLE) IMPLANT
NEEDLE HYPO 25X1 1.5 SAFETY (NEEDLE) IMPLANT
NS IRRIG 1000ML POUR BTL (IV SOLUTION) ×1 IMPLANT
PACK ORTHO EXTREMITY (CUSTOM PROCEDURE TRAY) ×1 IMPLANT
PAD CAST 3X4 CTTN HI CHSV (CAST SUPPLIES) ×1 IMPLANT
PAD CAST 4YDX4 CTTN HI CHSV (CAST SUPPLIES) IMPLANT
PADDING CAST ABS COTTON 4X4 ST (CAST SUPPLIES) ×1 IMPLANT
PADDING CAST COTTON 3X4 STRL (CAST SUPPLIES) ×1
PADDING CAST COTTON 4X4 STRL (CAST SUPPLIES)
SPEAR EYE SURG WECK-CEL (MISCELLANEOUS) ×1 IMPLANT
SPIKE FLUID TRANSFER (MISCELLANEOUS) ×1 IMPLANT
SPLINT PLASTER CAST XFAST 3X15 (CAST SUPPLIES) IMPLANT
STOCKINETTE 4X48 STRL (DRAPES) ×1 IMPLANT
SUT ETHILON 4 0 PS 2 18 (SUTURE) ×1 IMPLANT
SUT NYLON 9 0 VRM6 (SUTURE) IMPLANT
SYR BULB EAR ULCER 3OZ GRN STR (SYRINGE) ×1 IMPLANT
SYR CONTROL 10ML LL (SYRINGE) IMPLANT
TOWEL GREEN STERILE FF (TOWEL DISPOSABLE) ×2 IMPLANT
TUBE CONNECTING 12X1/4 (SUCTIONS) ×1 IMPLANT
UNDERPAD 30X36 HEAVY ABSORB (UNDERPADS AND DIAPERS) ×1 IMPLANT

## 2022-08-07 NOTE — Anesthesia Procedure Notes (Signed)
Anesthesia Regional Block: Supraclavicular block   Pre-Anesthetic Checklist: , timeout performed,  Correct Patient, Correct Site, Correct Laterality,  Correct Procedure, Correct Position, site marked,  Risks and benefits discussed,  Surgical consent,  Pre-op evaluation,  At surgeon's request and post-op pain management  Laterality: Right  Prep: Dura Prep       Needles:  Injection technique: Single-shot  Needle Type: Echogenic Stimulator Needle     Needle Length: 5cm  Needle Gauge: 20     Additional Needles:   Procedures:,,,, ultrasound used (permanent image in chart),,    Narrative:  Start time: 08/07/2022 1:05 PM End time: 08/07/2022 1:08 PM Injection made incrementally with aspirations every 5 mL.  Performed by: Personally  Anesthesiologist: Darral Dash, DO  Additional Notes: Patient identified. Risks/Benefits/Options discussed with patient including but not limited to bleeding, infection, nerve damage, failed block, incomplete pain control. Patient expressed understanding and wished to proceed. All questions were answered. Sterile technique was used throughout the entire procedure. Please see nursing notes for vital signs. Aspirated in 5cc intervals with injection for negative confirmation. Patient was given instructions on fall risk and not to get out of bed. All questions and concerns addressed with instructions to call with any issues or inadequate analgesia.

## 2022-08-07 NOTE — Op Note (Signed)
I assisted Surgeon(s) and Role:    * [name], MD - Primary    Daryll Brod, MD - Assisting on the Procedure(s): WOUND EXPLORATION RIGHT WRIST NERVE, [place] on 08/07/2022.  I provided assistance on this case as follows: Set up, approach, identification of the palmar cutaneous branch of the median nerve median nerve exploration radial artery flexor tendons, bringing the operative microscope into the field, microscopic repair of the median nerve, closure of the wound application dressing and splint.  Electronically signed by: Daryll Brod, MD Date: 08/07/2022 Time: 2:40 PM

## 2022-08-07 NOTE — Discharge Instructions (Signed)

## 2022-08-07 NOTE — Anesthesia Postprocedure Evaluation (Signed)
Anesthesia Post Note  Patient: Erika Wu  Procedure(s) Performed: WOUND EXPLORATION RIGHT WRIST (Right: Wrist) NERVE, TENDON AND ARTERY REPAIR (Right: Wrist)     Patient location during evaluation: PACU Anesthesia Type: Regional and MAC Level of consciousness: awake and alert Pain management: pain level controlled Vital Signs Assessment: post-procedure vital signs reviewed and stable Respiratory status: spontaneous breathing, nonlabored ventilation, respiratory function stable and patient connected to nasal cannula oxygen Cardiovascular status: stable and blood pressure returned to baseline Postop Assessment: no apparent nausea or vomiting Anesthetic complications: no   No notable events documented.  Last Vitals:  Vitals:   08/07/22 1500 08/07/22 1515  BP: (!) 158/56 (!) 143/54  Pulse: 67 64  Resp: 18 15  Temp:  36.5 C  SpO2: 91% 92%    Last Pain:  Vitals:   08/07/22 1515  TempSrc:   PainSc: 0-No pain                 Belenda Cruise P Raye Wiens

## 2022-08-07 NOTE — Anesthesia Procedure Notes (Signed)
Procedure Name: MAC Date/Time: 08/07/2022 1:57 PM  Performed by: Dorthea Cove, CRNAPre-anesthesia Checklist: Patient identified, Emergency Drugs available, Suction available, Patient being monitored and Timeout performed Patient Re-evaluated:Patient Re-evaluated prior to induction Oxygen Delivery Method: Nasal cannula Preoxygenation: Pre-oxygenation with 100% oxygen Induction Type: IV induction Placement Confirmation: positive ETCO2 and CO2 detector Dental Injury: Teeth and Oropharynx as per pre-operative assessment

## 2022-08-07 NOTE — H&P (Signed)
Erika Wu is an 76 y.o. female.   Chief Complaint: wrist laceration HPI: 76 yo female with laceration to right wrist sustained 07/28/22.  Seen at ED where wound sutured and closed.  Followed up in office.  Numbness in median nerve distribution.  She wishes to proceed with operative exploration with repair tendon/artery/nerve as necessary.  Allergies: No Known Allergies  Past Medical History:  Diagnosis Date   Allergy    Anxiety    Arthritis    Chest pain    a. 03/2015 Lexiscan MV: EF 59%, no ischemia/infarct.   Colon polyps    COVID-19 virus infection    09-22-2020, fully vax'd    Depression    GERD (gastroesophageal reflux disease)    Heart murmur    Hemorrhoids    Hyperlipidemia    Hypertension    Moderate aortic insufficiency    a. 03/2015 Echo: EF 55-60%, no rwma, mild LVH, Gr 1 DD, mild AS, mod AI (bicuspid AoV by report, poorly visualized on this study), Asc Ao diam 24m, triv MR.    Past Surgical History:  Procedure Laterality Date   CARDIAC CATHETERIZATION N/A 07/24/2016   Procedure: Left Heart Cath and Coronary Angiography;  Surgeon: Peter M JMartinique MD;  [place];  Service: Cardiovascular;  Laterality: N/A;   COLONOSCOPY     POLYPECTOMY     TONSILLECTOMY AND ADENOIDECTOMY     TUBAL LIGATION      Family History: Family History  Problem Relation Age of Onset   Heart attack Father        Died @ 569  Had CABG in his 4104's   Heart disease Father    Stomach cancer Maternal Grandmother    Colon cancer Maternal Grandmother    Diverticulosis Maternal Grandfather    Stroke Paternal Grandfather    HIV Brother    Hypertension Neg Hx    Colon polyps Neg Hx    Esophageal cancer Neg Hx    Rectal cancer Neg Hx     Social History:   reports that she has quit smoking. Her smoking use included cigarettes. She has a 7.50 pack-year smoking history. She has never used smokeless tobacco. She reports current alcohol use. She reports that she does not use  drugs.  Medications: Medications Prior to Admission  Medication Sig Dispense Refill   amLODipine (NORVASC) 5 MG tablet Take 1 tablet (5 mg total) by mouth daily. 90 tablet 3   buPROPion (WELLBUTRIN XL) 150 MG 24 hr tablet Take 450 mg by mouth every morning.  3   carvedilol (COREG) 25 MG tablet Take 1 tablet (25 mg total) by mouth 2 (two) times daily. 180 tablet 3   famotidine (PEPCID) 40 MG tablet Take 1 tablet (40 mg total) by mouth at bedtime. 90 tablet 1   mirtazapine (REMERON) 30 MG tablet Take 30 mg by mouth at bedtime.     pantoprazole (PROTONIX) 40 MG tablet Take 1 tablet (40 mg total) by mouth 2 (two) times daily before a meal. 180 tablet 3   rosuvastatin (CRESTOR) 10 MG tablet Take 1 tablet by mouth daily.     Suvorexant (BELSOMRA) 20 MG TABS Take 20 mg by mouth daily.     valsartan (DIOVAN) 80 MG tablet Take 1 tablet (80 mg total) by mouth 2 (two) times daily. 180 tablet 3   hydrALAZINE (APRESOLINE) 10 MG tablet Take 1 tablet (10 mg total) by mouth as needed (as needed for systolic Blood pressures greater than 170). (  Patient not taking: Reported on 07/31/2022) 30 tablet 1   hydrocortisone (ANUSOL-HC) 25 MG suppository Place 1 suppository (25 mg total) rectally daily for 7 days. Then daily as needed. (Patient not taking: Reported on 07/28/2022) 14 suppository 1    Results for orders placed or performed during the hospital encounter of 08/07/22 (from the past 48 hour(s))  CBC per protocol     Status: None   Collection Time: 08/07/22 11:21 AM  Result Value Ref Range   WBC 6.2 4.0 - 10.5 K/uL   RBC 4.00 3.87 - 5.11 MIL/uL   Hemoglobin 12.8 12.0 - 15.0 g/dL   HCT 38.0 36.0 - 46.0 %   MCV 95.0 80.0 - 100.0 fL   MCH 32.0 26.0 - 34.0 pg   MCHC 33.7 30.0 - 36.0 g/dL   RDW 12.1 11.5 - 15.5 %   Platelets 191 150 - 400 K/uL   nRBC 0.0 0.0 - 0.2 %    Comment: Performed at [facility], [place] [address], [place], [place] [address]  Basic metabolic panel per protocol     Status:  Abnormal   Collection Time: 08/07/22 11:21 AM  Result Value Ref Range   Sodium 140 135 - 145 mmol/L   Potassium 3.7 3.5 - 5.1 mmol/L   Chloride 110 98 - 111 mmol/L   CO2 21 (L) 22 - 32 mmol/L   Glucose, Bld 90 70 - 99 mg/dL    Comment: Glucose reference range applies only to samples taken after fasting for at least 8 hours.   BUN 8 8 - 23 mg/dL   Creatinine, Ser 0.69 0.44 - 1.00 mg/dL   Calcium 8.7 (L) 8.9 - 10.3 mg/dL   GFR, Estimated >60 >60 mL/min    Comment: (NOTE) Calculated using the CKD-EPI Creatinine Equation (2021)    Anion gap 9 5 - 15    Comment: Performed at [facility] [address] [place], [place] [address]    No results found.    Blood pressure (!) 159/50, pulse 62, temperature 97.7 F (36.5 C), temperature source Oral, resp. rate 18, height '5\' 2"'$  (1.575 m), weight 59 kg, SpO2 97 %.  General appearance: alert, cooperative, and appears stated age Head: Normocephalic, without obvious abnormality, atraumatic Neck: supple, symmetrical, trachea midline Extremities: Intact capillary refill all digits.  Decreased sensation median nerve distribution.  +epl/fpl/io. Laceration volar wrist. Pulses: 2+ and symmetric Skin: Skin color, texture, turgor normal. No rashes or lesions Neurologic: Grossly normal Incision/Wound: as above  Assessment/Plan Right wrist laceration with possible tendon/artery/nerve laceration.  Non operative and operative treatment options have been discussed with the patient and patient wishes to proceed with operative treatment. Risks, benefits, and alternatives of surgery have been discussed and the patient agrees with the plan of care.   Erika Wu 08/07/2022, 12:46 PM

## 2022-08-07 NOTE — Op Note (Signed)
NAME: Rincon RECORD NO: [id number] DATE OF BIRTH: [birth year] FACILITY: [facility] LOCATION: [facility] OR PHYSICIAN: Tennis Must, MD   OPERATIVE REPORT   DATE OF PROCEDURE: 08/07/22    PREOPERATIVE DIAGNOSIS: Right wrist laceration with possible tendon artery nerve injury   POSTOPERATIVE DIAGNOSIS: Right wrist laceration with partial median nerve laceration   PROCEDURE: 1.  Right wrist exploration was laceration 2.  Repair of right median nerve partial laceration under microscope   SURGEON:  [name], M.D.   ASSISTANT: Daryll Brod, MD   ANESTHESIA:  Regional with sedation   INTRAVENOUS FLUIDS:  Per anesthesia flow sheet.   ESTIMATED BLOOD LOSS:  Minimal.   COMPLICATIONS:  None.   SPECIMENS:  none   TOURNIQUET TIME:    Total Tourniquet Time Documented: Upper Arm (Right) - 39 minutes Total: Upper Arm (Right) - 39 minutes    DISPOSITION:  Stable to PACU.   INDICATIONS: 76 year old female states in November 18 she sustained a laceration to the right wrist.  She was seen at the emergency department where the wound was cleaned and sutured.  She follow-up in the office.  She had decreased sensation in the thumb index and long fingers.  She wished to proceed with operative exploration and repair of tendon artery nerve as necessary.  Risks, benefits and alternatives of surgery were discussed including the risks of blood loss, infection, damage to nerves, vessels, tendons, ligaments, bone for surgery, need for additional surgery, complications with wound healing, continued pain, stiffness, continued numbness.  She voiced understanding of these risks and elected to proceed.  OPERATIVE COURSE:  After being identified preoperatively by myself,  the patient and I agreed on the procedure and site of the procedure.  The surgical site was marked.  Surgical consent had been signed. Preoperative IV antibiotic prophylaxis was given. She was transferred to the operating room and  placed on the operating table in supine position with the right upper extremity on an arm board.  Sedation was induced by the anesthesiologist. A regional block had been performed by anesthesia in preoperative holding.    Right upper extremity was prepped and draped in normal sterile orthopedic fashion.  A surgical pause was performed between the surgeons, anesthesia, and operating room staff and all were in agreement as to the patient, procedure, and site of procedure.  Tourniquet at the proximal aspect of the extremity was inflated to 250 mmHg after exsanguination of the arm with an Esmarch bandage.  Sutures have been removed prior to prepping.  The wound was opened and explored.  The FCR tendon was intact.  There was partial laceration to the median nerve anteriorly.  The palmar cutaneous branch of the nerve was intact.  The FCU tendon was intact.  There was no laceration to any tendons deep to the median nerve.  The microscope was brought in.  This was used for microdissection of the median nerve and the laceration.  Clot was removed from the median nerve laceration.  The laceration was irrigated with sterile saline.  A 9-0 nylon suture was used in a interrupted fashion to reapproximate the epineurium.  Good reapproximation was obtained.  Wound was copiously irrigated with sterile saline.  Was closed with 4-0 nylon in a horizontal mattress fashion.  Was dressed with sterile Xeroform 4 x 4's and wrapped with a Kerlix bandage.  A dorsal blocking splint was placed and wrapped with Kerlix and Ace bandage.  The tourniquet was deflated at 39 minutes.  Fingertips were  pink with brisk capillary refill after deflation of tourniquet.  The operative  drapes were broken down.  The patient was awoken from anesthesia safely.  She was transferred back to the stretcher and taken to PACU in stable condition.  I will see her back in the office in 1 week for postoperative followup.  I will give her a prescription for Tramadol 50  mg 1-2 tabs PO q6 hours prn pain, dispense # 20.   [name], MD Electronically signed, 08/07/22

## 2022-08-07 NOTE — Transfer of Care (Signed)
Immediate Anesthesia Transfer of Care Note  Patient: Erika Wu  Procedure(s) Performed: WOUND EXPLORATION RIGHT WRIST (Right: Wrist) NERVE, TENDON AND ARTERY REPAIR (Right: Wrist)  Patient Location: PACU  Anesthesia Type:MAC and Regional  Level of Consciousness: awake, drowsy, and patient cooperative  Airway & Oxygen Therapy: Patient Spontanous Breathing and Patient connected to nasal cannula oxygen  Post-op Assessment: Report given to RN and Post -op Vital signs reviewed and stable  Post vital signs: Reviewed and stable  Last Vitals:  Vitals Value Taken Time  BP 169/62 08/07/22 1448  Temp    Pulse 63 08/07/22 1450  Resp 17 08/07/22 1450  SpO2 94 % 08/07/22 1450  Vitals shown include unvalidated device data.  Last Pain:  Vitals:   08/07/22 1127  TempSrc:   PainSc: 7       Patients Stated Pain Goal: 2 (47/07/61 5183)  Complications: No notable events documented.

## 2022-08-08 ENCOUNTER — Encounter (HOSPITAL_COMMUNITY): Payer: Self-pay | Admitting: Orthopedic Surgery

## 2022-08-15 ENCOUNTER — Ambulatory Visit (HOSPITAL_COMMUNITY): Payer: Medicare PPO | Attending: Cardiology

## 2022-08-15 DIAGNOSIS — I351 Nonrheumatic aortic (valve) insufficiency: Secondary | ICD-10-CM | POA: Diagnosis present

## 2022-08-15 LAB — ECHOCARDIOGRAM COMPLETE
AR max vel: 0.82 cm2
AV Area VTI: 0.89 cm2
AV Area mean vel: 0.85 cm2
AV Mean grad: 31 mmHg
AV Peak grad: 56.3 mmHg
Ao pk vel: 3.75 m/s
Area-P 1/2: 3 cm2
MV M vel: 4.55 m/s
MV Peak grad: 82.8 mmHg
P 1/2 time: 508 msec
S' Lateral: 3.3 cm

## 2022-08-17 ENCOUNTER — Other Ambulatory Visit: Payer: Self-pay | Admitting: Thoracic Surgery (Cardiothoracic Vascular Surgery)

## 2022-08-17 DIAGNOSIS — I7121 Aneurysm of the ascending aorta, without rupture: Secondary | ICD-10-CM

## 2022-08-22 ENCOUNTER — Telehealth: Payer: Self-pay

## 2022-08-22 NOTE — Telephone Encounter (Signed)
Spoke with patient's daughter & reminder her to follow up with MRI/MRPC. Scheduling number provided if she has not heard from radiology within the week.

## 2022-08-22 NOTE — Telephone Encounter (Signed)
Left message for patient to call back. Needs MRI/MRCP. Order was previously placed. Schedulers notified.

## 2022-09-11 ENCOUNTER — Telehealth: Payer: Self-pay | Admitting: Gastroenterology

## 2022-09-11 NOTE — Telephone Encounter (Signed)
Patient's daughter stated patient was not home & to call back in 20 minutes.

## 2022-09-11 NOTE — Telephone Encounter (Signed)
Left message for patient to call back  

## 2022-09-11 NOTE — Telephone Encounter (Signed)
Patient called in stating she has been experiencing, loose watery stools for 1 week (up to 10 times/day at times). It has improved some in frequency since starting imodium prn, however the consistency is unchanged. Denies any abdominal pain, n/v. She is scheduled for MRI/MRCP on 09/17/22 for follow up of pancreatic lesion. Patient wanted MD to be aware & if she should try anything else in the meantime prior to scan. Last seen for OV on 04/17/22 with Estill Bamberg, [place].

## 2022-09-11 NOTE — Telephone Encounter (Signed)
Patient has an upcoming MRI on Monday but she has been having major issues with diarrhea and wants to speak with Dr. Tarri Glenn' nurse.  Please call and advise.  Thank you.

## 2022-09-12 NOTE — Telephone Encounter (Signed)
Left message for patient to call back  

## 2022-09-13 NOTE — Telephone Encounter (Signed)
Unable to reach patient x 3. Left message for patient to call back.

## 2022-09-14 ENCOUNTER — Other Ambulatory Visit: Payer: Self-pay

## 2022-09-14 ENCOUNTER — Other Ambulatory Visit: Payer: Medicare PPO

## 2022-09-14 DIAGNOSIS — R197 Diarrhea, unspecified: Secondary | ICD-10-CM

## 2022-09-14 DIAGNOSIS — K869 Disease of pancreas, unspecified: Secondary | ICD-10-CM

## 2022-09-14 MED ORDER — PANCRELIPASE (LIP-PROT-AMYL) 36000-114000 UNITS PO CPEP: 72000.0000 [IU] | ORAL_CAPSULE | Freq: Three times a day (TID) | ORAL | 0 refills | Status: DC

## 2022-09-14 NOTE — Telephone Encounter (Signed)
Pt made aware of Vicie Mutters PA  recommendations: Order for labs placed in Epic: Pt made aware: Location to lab provided: Prescription for Creon sent to pharmacy. Pt made aware  Pt verbalized understanding with all questions answered.

## 2022-09-17 ENCOUNTER — Ambulatory Visit (HOSPITAL_COMMUNITY): Payer: Medicare PPO

## 2022-09-19 ENCOUNTER — Other Ambulatory Visit (INDEPENDENT_AMBULATORY_CARE_PROVIDER_SITE_OTHER): Payer: Medicare PPO

## 2022-09-19 DIAGNOSIS — R197 Diarrhea, unspecified: Secondary | ICD-10-CM

## 2022-09-19 LAB — CBC WITH DIFFERENTIAL/PLATELET
Basophils Absolute: 0 10*3/uL (ref 0.0–0.1)
Basophils Relative: 0.7 % (ref 0.0–3.0)
Eosinophils Absolute: 0.1 10*3/uL (ref 0.0–0.7)
Eosinophils Relative: 2.4 % (ref 0.0–5.0)
HCT: 38.5 % (ref 36.0–46.0)
Hemoglobin: 13 g/dL (ref 12.0–15.0)
Lymphocytes Relative: 22.2 % (ref 12.0–46.0)
Lymphs Abs: 1.2 10*3/uL (ref 0.7–4.0)
MCHC: 33.8 g/dL (ref 30.0–36.0)
MCV: 94.2 fl (ref 78.0–100.0)
Monocytes Absolute: 0.4 10*3/uL (ref 0.1–1.0)
Monocytes Relative: 8.2 % (ref 3.0–12.0)
Neutro Abs: 3.6 10*3/uL (ref 1.4–7.7)
Neutrophils Relative %: 66.5 % (ref 43.0–77.0)
Platelets: 229 10*3/uL (ref 150.0–400.0)
RBC: 4.08 Mil/uL (ref 3.87–5.11)
RDW: 12.6 % (ref 11.5–15.5)
WBC: 5.4 10*3/uL (ref 4.0–10.5)

## 2022-09-19 LAB — COMPREHENSIVE METABOLIC PANEL
ALT: 18 U/L (ref 0–35)
AST: 16 U/L (ref 0–37)
Albumin: 4 g/dL (ref 3.5–5.2)
Alkaline Phosphatase: 107 U/L (ref 39–117)
BUN: 13 mg/dL (ref 6–23)
CO2: 22 mEq/L (ref 19–32)
Calcium: 8.2 mg/dL — ABNORMAL LOW (ref 8.4–10.5)
Chloride: 112 mEq/L (ref 96–112)
Creatinine, Ser: 0.68 mg/dL (ref 0.40–1.20)
GFR: 85.04 mL/min (ref >60.00)
Glucose, Bld: 91 mg/dL (ref 70–99)
Potassium: 3.9 mEq/L (ref 3.5–5.1)
Sodium: 143 mEq/L (ref 135–145)
Total Bilirubin: 0.5 mg/dL (ref 0.2–1.2)
Total Protein: 6.3 g/dL (ref 6.0–8.3)

## 2022-09-19 LAB — SEDIMENTATION RATE: Sed Rate: 5 mm/hr (ref 0–30)

## 2022-09-20 LAB — CLOSTRIDIUM DIFFICILE BY PCR: Toxigenic C. Difficile by PCR: NEGATIVE

## 2022-09-23 LAB — GI PROFILE, STOOL, PCR

## 2022-09-25 ENCOUNTER — Telehealth: Payer: Self-pay | Admitting: Physician Assistant

## 2022-09-25 NOTE — Telephone Encounter (Signed)
Spoke with patient regarding results. She says she has been taking Creon supplements & her diarrhea has improved. She is only going once daily in the morning now, where as before she was going most of the day. She has MRI/MRCP 1 year follow up scheduled for 09/27/22 & will plan to attend OV with Estill Bamberg on 10/05/22. No further questions.

## 2022-09-25 NOTE — Telephone Encounter (Signed)
Patient called in regards to her lab results.  Please advise.

## 2022-09-27 ENCOUNTER — Other Ambulatory Visit: Payer: Self-pay | Admitting: Physician Assistant

## 2022-09-27 ENCOUNTER — Ambulatory Visit (HOSPITAL_COMMUNITY)
Admission: RE | Admit: 2022-09-27 | Discharge: 2022-09-27 | Disposition: A | Discharge status: Home / Self Care | Payer: Medicare PPO | Source: Ambulatory Visit | Attending: Physician Assistant | Admitting: Physician Assistant

## 2022-09-27 DIAGNOSIS — K869 Disease of pancreas, unspecified: Secondary | ICD-10-CM | POA: Diagnosis present

## 2022-09-27 DIAGNOSIS — R110 Nausea: Secondary | ICD-10-CM | POA: Diagnosis not present

## 2022-09-27 MED ORDER — GADOBUTROL 1 MMOL/ML IV SOLN
6.0000 mL | Freq: Once | INTRAVENOUS | Status: AC | PRN
  Administered 2022-09-27: 6 mL via INTRAVENOUS

## 2022-10-02 NOTE — Progress Notes (Signed)
10/05/2022 Erika Wu [id number] [birth year]  Referring provider: Geralynn Rile, * Primary GI doctor: Dr. Tarri Glenn  ASSESSMENT AND PLAN:   Diarrhea with bloating SIBO positive treated with xifaxin and neomycin, symptoms returned Has done trial of creon and states this has helped greatly, was taking it after food though Will continue creon but start taking before food, discussed mechanism Avoid ETOH FODMAP discussed  Lesion of pancreas Very slight increase in cystic area head of the pancrease, possible ductal dilatation Discussed EUS versus close follow up MRCP in 6 months, she prefers 6 month follow up Will call if any new or worsening symptoms  Nausea without vomiting Has resolved with cutting back on ETOH and pepcid daily Lifestyle changes discussed, avoid NSAIDS, ETOH   History of Present Illness:  77 y.o. female  with a past medical history of anxiety, depression, hypertension, osteoporosis, aortic insufficiency, personal history of tubular adenomatous and others listed below, returns to clinic today for evaluation of diarrhea and hemorrhoids.  Remote history of esophageal stricture status post dilatation 2010 10/31/2020 colonoscopy sessile serrated polyps recommended follow-up 10/2025 11/22/2021 office visit with Dr. Tarri Glenn, having worsening bowel habits after death of her mother in 10-04-2022, nervous stomach with frequent Worsening eructation and flatulence.   SIBO testing 10/2021 positive for methane, resolution of symptoms with Xifaxan and neomycin for 14 days but symptoms returned. 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 04/17/2022 OV with me for diarrhea, trial of famotidine, Fdguard, FODMAP.  If symptoms continuing consider EUS versus repeat treatment SIBO. Trial of creon, symptoms improved greatly.  GI pathogen, Cdiff, sed rate negative, no  leukocyotosis, anemia, normal kidney/liver   09/27/2022 MRCP Mildly complex cystic area seen in the head of the pancreas superiorly is slightly larger today going back to December 2022. This is somewhat serpiginous and could be a dilated branch duct. Based on size, appearance and the patient's age recommend continued surveillance in 6 months. The midbody pancreatic duct does show some dilatation today which was not as well appreciated on the prior examination but no focal stricture or asymmetric pancreatic atrophy.  This also can be assessed on short follow-up. Stable central hepatic benign cystic lesion.  She states the creon is helping with her diarrhea and bloating significantly but she has been taking it after food. No GERD, no nausea, no vomiting, just burping.  She was drinking more over the holidays and states stopped which has helped some with her symptoms. She still drinks a few glasses of wine 2-3 days a week.  States still was more soft serve or loose, since taking creon has more  formed stool.  She denies melena, dysphagia. No hematochezia.  She has been trying to loose weight with her diet and avoid low fat foods.   Wt Readings from Last 6 Encounters:  10/05/22 124 lb (56.2 kg)  08/07/22 130 lb (59 kg)  04/17/22 128 lb (58.1 kg)  04/03/22 128 lb (58.1 kg)  03/28/22 127 lb 9.6 oz (57.9 kg)  12/05/21 130 lb 9.6 oz (59.2 kg)    Current Medications:    Current Outpatient Medications (Cardiovascular):    amLODipine (NORVASC) 5 MG tablet, Take 1 tablet (5 mg total) by mouth daily.   carvedilol (COREG) 25 MG tablet, Take 1 tablet (25 mg total) by mouth 2 (two) times daily.   rosuvastatin (CRESTOR) 10 MG tablet, Take 1 tablet by mouth daily.  valsartan (DIOVAN) 80 MG tablet, Take 1 tablet (80 mg total) by mouth 2 (two) times daily.   hydrALAZINE (APRESOLINE) 10 MG tablet, Take 1 tablet (10 mg total) by mouth as needed (as needed for systolic Blood pressures greater than 170).  (Patient not taking: Reported on 07/31/2022)   Current Outpatient Medications (Analgesics):    traMADol (ULTRAM) 50 MG tablet, 1-2 tabs PO q6 hours prn pain   Current Outpatient Medications (Other):    buPROPion (WELLBUTRIN XL) 150 MG 24 hr tablet, Take 450 mg by mouth every morning.   famotidine (PEPCID) 40 MG tablet, Take 1 tablet (40 mg total) by mouth at bedtime.   hydrocortisone (ANUSOL-HC) 25 MG suppository, Place 1 suppository (25 mg total) rectally daily for 7 days. Then daily as needed.   lipase/protease/amylase (CREON) 36000 UNITS CPEP capsule, Take 2 capsules (72,000 Units total) by mouth 3 (three) times daily with meals.   mirtazapine (REMERON) 30 MG tablet, Take 30 mg by mouth at bedtime.   pantoprazole (PROTONIX) 40 MG tablet, Take 1 tablet (40 mg total) by mouth 2 (two) times daily before a meal.   Suvorexant (BELSOMRA) 20 MG TABS, Take 20 mg by mouth daily.  Surgical History:  She  has a past surgical history that includes Tonsillectomy and adenoidectomy; Tubal ligation; Colonoscopy; Cardiac catheterization (N/A, 07/24/2016); Polypectomy; Wound exploration (Right, 08/07/2022); Nerve, tendon and artery repair (Right, 08/07/2022); and Hand Debridement (07/2022). Family History:  Her family history includes Colon cancer in her maternal grandmother; Diverticulosis in her maternal grandfather; HIV in her brother; Heart attack in her father; Heart disease in her father; Stomach cancer in her maternal grandmother; Stroke in her paternal grandfather. Social History:   reports that she has quit smoking. Her smoking use included cigarettes. She has a 7.50 pack-year smoking history. She has never used smokeless tobacco. She reports current alcohol use. She reports that she does not use drugs.  Current Medications, Allergies, Past Medical History, Past Surgical History, Family History and Social History were reviewed in [employer] record.  Physical Exam: BP  122/60   Pulse 65   Ht '5\' 3"'$  (1.6 m)   Wt 124 lb (56.2 kg)   SpO2 91%   BMI 21.97 kg/m  General:   Pleasant, well developed female in no acute distress Heart : Regular rate and rhythm; harsh holosystolic murmur RSB. Pulm: Clear anteriorly; no wheezing Abdomen:  Soft, Non-distended AB, Active bowel sounds. No tenderness. Without guarding and Without rebound, No organomegaly appreciated. Rectal: Not evaluated Extremities:  without  edema. Neurologic:  Alert and  oriented x4;  No focal deficits.  Psych:  Cooperative. Normal mood and affect.   Vladimir Crofts, PA-C 10/05/22

## 2022-10-05 ENCOUNTER — Ambulatory Visit: Payer: Medicare PPO | Admitting: Physician Assistant

## 2022-10-05 ENCOUNTER — Encounter: Payer: Self-pay | Admitting: Physician Assistant

## 2022-10-05 VITALS — BP 122/60 | HR 65 | Ht 63.0 in | Wt 124.0 lb

## 2022-10-05 DIAGNOSIS — R110 Nausea: Secondary | ICD-10-CM

## 2022-10-05 DIAGNOSIS — K869 Disease of pancreas, unspecified: Secondary | ICD-10-CM | POA: Diagnosis not present

## 2022-10-05 DIAGNOSIS — R197 Diarrhea, unspecified: Secondary | ICD-10-CM

## 2022-10-05 DIAGNOSIS — R140 Abdominal distension (gaseous): Secondary | ICD-10-CM | POA: Diagnosis not present

## 2022-10-05 NOTE — Patient Instructions (Addendum)
You will need a MRCP in 6 months and a office visit after that for follow up. We will contact you when that time gets closer.  Avoid spicy and acidic foods Avoid fatty foods Limit your intake of coffee, tea, alcohol, and carbonated drinks Work to maintain a healthy weight Keep the head of the bed elevated at least 3 inches with blocks or a wedge pillow if you are having any nighttime symptoms Stay upright for 2 hours after eating Avoid meals and snacks three to four hours before bedtime  Take the creon BEFORE food, 1 with a snack and 2 with large meals or fatty foods.  Do smaller more frequent meals Try to do low fat Do more lean protein Avoid too much fiber like lentils and beans- do smaller portions.  Avoid alcohol and do not smoke.   Pancreatitic Eating Plan What are tips for following this plan? Reading food labels Use the information on food labels to help keep track of how much fat you eat: Check the serving size. Look for the amount of total fat in grams (g) in one serving. Low-fat foods have 3 g of fat or less per serving. Fat-free foods have 0.5 g of fat or less per serving. Keep track of how much fat you eat based on how many servings you eat. For example, if you eat two servings, the amount of fat you eat will be two times what is listed on the label. Shopping  Buy low-fat or nonfat foods, such as: Fresh, frozen, or canned fruits and vegetables. Grains, including pasta, bread, and rice. Lean meat, poultry, fish, and other protein foods. Low-fat or nonfat dairy. Avoid buying bakery products and other sweets made with whole milk, butter, and eggs. Avoid buying snack foods with added fat, such as anything with butter or cheese flavoring. Cooking Remove skin from poultry, and remove extra fat from meat. Limit the amount of fat and oil you use to 6 teaspoons or less per day. Cook using low-fat methods, such as boiling, broiling, grilling, steaming, or baking. Use spray  oil to cook. Add fat-free chicken broth to add flavor and moisture. Avoid adding cream to thicken soups or sauces. Use other thickeners such as corn starch or tomato paste. Meal planning  Eat a low-fat diet as told by your dietitian. For most people, this means having no more than 55-65 grams of fat each day. Eat small, frequent meals throughout the day. For example, you may have 5-6 small meals instead of 3 large meals. Drink enough fluid to keep your urine pale yellow. Do not drink alcohol. Talk to your health care provider if you need help stopping. Limit how much caffeine you have, including black coffee, black and green tea, caffeinated soft drinks, and energy drinks. General information Let your health care provider or dietitian know if you have unplanned weight loss on this eating plan. You may be instructed to follow a clear liquid diet during a flare of symptoms. Talk with your health care provider about how to manage your diet during symptoms of a flare. Take any vitamins or supplements as told by your health care provider. Work with a Microbiologist, especially if you have other conditions such as obesity or diabetes mellitus. What foods should I avoid? Fruits Fried fruits. Fruits served with butter or cream. Vegetables Fried vegetables. Vegetables cooked with butter, cheese, or cream. Grains Biscuits, waffles, donuts, pastries, and croissants. Pies and cookies. Butter-flavored popcorn. Regular crackers. Meats and other protein foods Fatty  cuts of meat. Poultry with skin. Organ meats. Bacon, sausage, and cold cuts. Whole eggs. Nuts and nut butters. Dairy Whole and 2% milk. Whole milk yogurt. Whole milk ice cream. Cream and half-and-half. Cream cheese. Sour cream. Cheese. Beverages Wine, beer, and liquor. The items listed above may not be a complete list of foods and beverages to avoid. Contact a dietitian for more information.  This information is not intended to replace advice  given to you by your health care provider. Make sure you discuss any questions you have with your health care provider. Document Revised: 12/18/2018 Document Reviewed: 12/03/2017 Elsevier Patient Education  2022 [employer].   I appreciate the opportunity to care for you. Vicie Mutters, PA-C

## 2022-10-08 NOTE — Progress Notes (Signed)
Reviewed and agree with management plans. ? ?Conal Shetley L. Lu Paradise, MD, MPH  ?

## 2022-10-11 ENCOUNTER — Ambulatory Visit
Admission: RE | Admit: 2022-10-11 | Discharge: 2022-10-11 | Disposition: A | Discharge status: Home / Self Care | Payer: Medicare PPO | Source: Ambulatory Visit | Attending: Thoracic Surgery (Cardiothoracic Vascular Surgery) | Admitting: Thoracic Surgery (Cardiothoracic Vascular Surgery)

## 2022-10-11 DIAGNOSIS — I7121 Aneurysm of the ascending aorta, without rupture: Secondary | ICD-10-CM

## 2022-10-11 MED ORDER — IOPAMIDOL (ISOVUE-370) INJECTION 76%
75.0000 mL | Freq: Once | INTRAVENOUS | Status: AC | PRN
  Administered 2022-10-11: 75 mL via INTRAVENOUS

## 2022-10-13 ENCOUNTER — Other Ambulatory Visit: Payer: Self-pay | Admitting: Physician Assistant

## 2022-10-13 DIAGNOSIS — K869 Disease of pancreas, unspecified: Secondary | ICD-10-CM

## 2022-10-13 DIAGNOSIS — R197 Diarrhea, unspecified: Secondary | ICD-10-CM

## 2022-10-16 ENCOUNTER — Ambulatory Visit: Payer: Medicare PPO | Admitting: Thoracic Surgery (Cardiothoracic Vascular Surgery)

## 2022-10-16 VITALS — BP 147/69 | HR 60 | Resp 18 | Ht 63.0 in | Wt 124.0 lb

## 2022-10-16 DIAGNOSIS — I7121 Aneurysm of the ascending aorta, without rupture: Secondary | ICD-10-CM | POA: Diagnosis not present

## 2022-10-16 NOTE — Progress Notes (Signed)
[address]       [place] [address]             [phone]    HPI: Erika Wu returns for follow-up of her ascending aneurysm.  Erika Wu is a 77 year old woman with a past history significant for heart murmur, aortic stenosis, aortic insufficiency, ascending aneurysm, hypertension, hyperlipidemia, reflux, anxiety, and depression.  She is followed by Dr. Martinique.  I last saw her in July 2023.  Her aneurysm was 4.5 cm.  She had an echocardiogram in December.  No change in her severe AI or mild to moderate AS.  Preserved left ventricular systolic function.  Grade 1 diastolic dysfunction.  No left ventricular dilatation.  In the interim since her last visit she has been feeling well.  She is not having any chest pain, pressure, tightness, shortness of breath, orthopnea, or peripheral edema.  She did have a fall, suffering a wrist laceration which required surgery to repair a tendon.  Past Medical History:  Diagnosis Date   Allergy    Anxiety    Arthritis    Chest pain    a. 03/2015 Lexiscan MV: EF 59%, no ischemia/infarct.   Colon polyps    COVID-19 virus infection    09-22-2020, fully vax'd    Depression    GERD (gastroesophageal reflux disease)    Heart murmur    Hemorrhoids    Hyperlipidemia    Hypertension    Moderate aortic insufficiency    a. 03/2015 Echo: EF 55-60%, no rwma, mild LVH, Gr 1 DD, mild AS, mod AI (bicuspid AoV by report, poorly visualized on this study), Asc Ao diam 18m, triv MR.    Current Outpatient Medications  Medication Sig Dispense Refill   amLODipine (NORVASC) 5 MG tablet Take 1 tablet (5 mg total) by mouth daily. 90 tablet 3   buPROPion (WELLBUTRIN XL) 150 MG 24 hr tablet Take 450 mg by mouth every morning.  3   CREON 36000-114000 units CPEP capsule TAKE 2 CAPSULES BY MOUTH THREE TIMES DAILY WITH MEALS 180 capsule 0   famotidine (PEPCID) 40 MG tablet Take 1 tablet (40 mg total) by mouth at bedtime. 90 tablet 1    hydrocortisone (ANUSOL-HC) 25 MG suppository Place 1 suppository (25 mg total) rectally daily for 7 days. Then daily as needed. 14 suppository 1   mirtazapine (REMERON) 30 MG tablet Take 30 mg by mouth at bedtime.     pantoprazole (PROTONIX) 40 MG tablet Take 1 tablet (40 mg total) by mouth 2 (two) times daily before a meal. 180 tablet 3   rosuvastatin (CRESTOR) 10 MG tablet Take 1 tablet by mouth daily.     Suvorexant (BELSOMRA) 20 MG TABS Take 20 mg by mouth daily.     valsartan (DIOVAN) 80 MG tablet Take 1 tablet (80 mg total) by mouth 2 (two) times daily. 180 tablet 3   carvedilol (COREG) 25 MG tablet Take 1 tablet (25 mg total) by mouth 2 (two) times daily. 180 tablet 3   hydrALAZINE (APRESOLINE) 10 MG tablet Take 1 tablet (10 mg total) by mouth as needed (as needed for systolic Blood pressures greater than 170). (Patient not taking: Reported on 07/31/2022) 30 tablet 1   No current facility-administered medications for this visit.    Physical Exam BP (!) 147/69 (BP Location: Left Arm, Patient Position: Sitting)   Pulse 60   Resp 18   Ht '5\' 3"'$  (1.6 m)   Wt 124 lb (  56.2 kg)   SpO2 93% Comment: RA  BMI 21.86 kg/m  77 year old woman in no acute distress Well-developed well-nourished Alert and oriented x 3 with no focal deficits Cardiac regular rate and rhythm with a 3/6 systolic murmur and 2/6 diastolic murmur Lungs clear with equal breath sounds bilaterally, no rales or wheezing No peripheral edema  Diagnostic Tests: CT ANGIOGRAPHY CHEST WITH CONTRAST   TECHNIQUE: Multidetector CT imaging of the chest was performed using the standard protocol during bolus administration of intravenous contrast. Multiplanar CT image reconstructions and MIPs were obtained to evaluate the vascular anatomy.   RADIATION DOSE REDUCTION: This exam was performed according to the departmental dose-optimization program which includes automated exposure control, adjustment of the mA and/or kV according  to patient size and/or use of iterative reconstruction technique.   CONTRAST:  97m ISOVUE-370 IOPAMIDOL (ISOVUE-370) INJECTION 76%   COMPARISON:  March 12, 2022, September 27, 2022 trauma on March 30, 2021   FINDINGS: Cardiovascular: Cardiomegaly. Predominantly LEFT-sided coronary artery atherosclerotic calcifications. No significant pericardial effusion. Moderate atherosclerotic calcifications of the aorta. ReVisualization of an aneurysm of the ascending thoracic aorta. Ascending thoracic aorta measures approximately 4.5 by 4.6 cm although the evaluation is limited by motion. This is similar in comparison to prior. Sino-tubular junction measures approximately 3.6 cm. The level at the sinuses of Valsalva measure approximately 4 by 4.1 cm.   Mediastinum/Nodes: Visualized thyroid is unremarkable. No axillary adenopathy or mediastinal adenopathy.   Lungs/Pleura: No pleural effusion or pneumothorax. Scattered atelectasis. LEFT lower lobe pulmonary nodule measures 4 mm, stable dating back to July 2022 (series 9, image 277).   Upper Abdomen: Partial visualization of a dilated pancreatic duct and cyst of the LEFT liver. This is better assessed on recent MRI.   Musculoskeletal: Healing LEFT anterolateral rib fractures.   Review of the MIP images confirms the above findings.   IMPRESSION: 1. Similar aneurysmal dilatation of the ascending thoracic aorta measuring up to 4.6 cm. Recommend semi-annual imaging followup by CTA or MRA and referral to cardiothoracic surgery if not already obtained. This recommendation follows 2010 ACCF/AHA/AATS/ACR/ASA/SCA/SCAI/SIR/STS/SVM Guidelines for the Diagnosis and Management of Patients With Thoracic Aortic Disease. Circulation. 2010; 121:: Z610-R604 Aortic aneurysm NOS (ICD10-I71.9) 2. Healing LEFT anterolateral rib fractures. 3. Partial visualization of a dilated pancreatic duct and cyst of the LEFT liver. This is better assessed on recent MRI. 4.  Stable 4 mm LEFT lower lobe pulmonary nodule dating back to July 2022. No follow-up needed if patient is low-risk.This recommendation follows the consensus statement: Guidelines for Management of Incidental Pulmonary Nodules Detected on CT Images: From the Fleischner Society 2017; Radiology 2017; 284:228-243.   Aortic Atherosclerosis (ICD10-I70.0).     Electronically Signed   By: SValentino SaxonM.D.   On: 10/11/2022 10:54 I personally reviewed the CT images.  No change in the 4.5 cm ascending aneurysm.  Impression: MSanjuanita Condreyis a 77year old woman with a past history significant for heart murmur, aortic stenosis, aortic insufficiency, ascending aneurysm, hypertension, hyperlipidemia, reflux, anxiety, and depression.    Ascending aneurysm-stable at 4.5 cm.  No indication for surgery at present.  Needs continued semiannual follow-up.  Aortic insufficiency and aortic stenosis-has severe AI with mild to moderate AS.  Followed by Dr. JMartinique  No indication for surgery at this time but needs close follow-up for any signs of left-ventricular dilatation or worsening diastolic dysfunction.  Hypertension-blood pressure elevated at 147/69.  She has not been checking herself regularly at home.  I emphasized the importance of monitoring that  and having her medications adjusted if she is persistently high.  Plan: Monitor blood pressure at home Return in 6 months with CT angio of chest  I spent over 20 minutes in review of records, images, and consultation with Ms. Vanstone today. Erika Nakayama, MD [facility] [phone]

## 2022-10-25 ENCOUNTER — Encounter: Payer: Self-pay | Admitting: Emergency Medicine

## 2022-10-25 ENCOUNTER — Telehealth: Payer: Self-pay | Admitting: Cardiology

## 2022-10-25 NOTE — Telephone Encounter (Signed)
Pt c/o BP issue: STAT if pt c/o blurred vision, one-sided weakness or slurred speech  1. What are your last 5 BP readings?     185/76 166/75  2. Are you having any other symptoms (ex. Dizziness, headache, blurred vision, passed out)?    No  3. What is your BP issue?    Patient stated she spoke with her regular doctor regarding amLODipine (NORVASC) 5 MG tablet as her BP is remaining high and he suggested she have this medication increased.  BP this morning 185/76, then 166/75 before taking medication.

## 2022-10-25 NOTE — Telephone Encounter (Signed)
Returned pt's call to clarify her BP'S and to see if she had more readings other than the 2 she already gave. No answer, left message and call back number. Will send MyChart message as well.

## 2022-10-31 NOTE — Telephone Encounter (Signed)
Attempted to contact patient to see how blood pressure readings were doing.   Patient did not answer.  Left call back number.

## 2022-11-05 NOTE — Telephone Encounter (Signed)
Left message for pt to call.

## 2022-11-13 ENCOUNTER — Ambulatory Visit
Admission: RE | Admit: 2022-11-13 | Discharge: 2022-11-13 | Disposition: A | Discharge status: Home / Self Care | Payer: Medicare PPO | Source: Ambulatory Visit | Attending: Internal Medicine | Admitting: Internal Medicine

## 2022-11-13 DIAGNOSIS — M810 Age-related osteoporosis without current pathological fracture: Secondary | ICD-10-CM

## 2022-11-16 ENCOUNTER — Telehealth: Payer: Self-pay | Admitting: Cardiology

## 2022-11-16 MED ORDER — CARVEDILOL 25 MG PO TABS: 25.0000 mg | ORAL_TABLET | Freq: Two times a day (BID) | ORAL | 1 refills | Status: DC

## 2022-11-16 NOTE — Telephone Encounter (Signed)
*  STAT* If patient is at the pharmacy, call can be transferred to refill team.   1. Which medications need to be refilled? (please list name of each medication and dose if known)  carvedilol (COREG) 25 MG tablet   2. Which pharmacy/location (including street and city if local pharmacy) is medication to be sent to? [place], [place]    3. Do they need a 30 day or 90 day supply?  90 day supply  Patient mentions that she only has 2 tablets remaining.

## 2022-11-16 NOTE — Telephone Encounter (Signed)
Refills has been sent to the pharmacy. 

## 2022-11-30 ENCOUNTER — Other Ambulatory Visit: Payer: Self-pay | Admitting: *Deleted

## 2022-11-30 MED ORDER — AMLODIPINE BESYLATE 5 MG PO TABS: 5.0000 mg | ORAL_TABLET | Freq: Every day | ORAL | 3 refills | Status: DC

## 2023-01-02 ENCOUNTER — Telehealth: Payer: Self-pay | Admitting: Cardiology

## 2023-01-02 NOTE — Telephone Encounter (Signed)
Pt c/o of Chest Pain: STAT if CP now or developed within 24 hours   1. Are you having CP right now? No, this morning for about 10 mins, maybe less than that. After she took off her Bra and rested for a few minutes, the chest tightness went away.    2. Are you experiencing any other symptoms (ex. SOB, nausea, vomiting, sweating)? A little bit of nausea earlier   3. How long have you been experiencing CP? Today; only had it today    4. Is your CP continuous or coming and going? Coming and going   5. Have you taken Nitroglycerin? No     ? Pt c/o BP issue: STAT if pt c/o blurred vision, one-sided weakness or slurred speech   1. What are your last 5 BP readings? 147/103    2. Are you having any other symptoms (ex. Dizziness, headache, blurred vision, passed out)? No   3. What is your BP issue? Pt is very concerned about bp being high and her pcp as well since pt has passed out twice and fell back to back.     Pt reports that she had a physical yesterday and her PCP said it would be a good idea to contact her cardiologist due to her passing out/falling. Within the last year she has passed out twice- both associated with "getting hurt/fell first" with pain/or seeing blood.   She reports that one fall- was due to getting on a chair and grabbing a glass, the chair was unsteady and she fell and cut herself on the glass- then passed out.  The other time, she twisted her ankle, then fell; then passed out afterwards.   Now BP 143/69 hr 63- Pt feels like she has calmed down more. No pain, no sob, no dizziness, no nausea, at this time. She also mentions that when she had the incident this morning she had not taken her morning BP meds; but did have a piece of toast and coffee for breakfast.   Instructed pt to start checking BP's daily, 1-2hr's after taking her meds. Gave ER precautions. Told her that I would send this info to her provider.   Dr Elvis Coil first available appt is not for 1-32months; did  not know if pt should be scheduled with an APP, or if it is ok to wait. She is overdue to appt.

## 2023-01-02 NOTE — Telephone Encounter (Signed)
Pt c/o of Chest Pain: STAT if CP now or developed within 24 hours  1. Are you having CP right now? No, this morning for about 10 mins  2. Are you experiencing any other symptoms (ex. SOB, nausea, vomiting, sweating)? A little bit of nausea earlier  3. How long have you been experiencing CP? Today   4. Is your CP continuous or coming and going? Coming and going  5. Have you taken Nitroglycerin? No   ? Pt c/o BP issue: STAT if pt c/o blurred vision, one-sided weakness or slurred speech  1. What are your last 5 BP readings? 147/103  2. Are you having any other symptoms (ex. Dizziness, headache, blurred vision, passed out)? No  3. What is your BP issue? Pt is very concerned about bp being high and her pcp as well since pt has passed out twice and fell back to back.

## 2023-01-03 NOTE — Telephone Encounter (Signed)
Called patient left message on personal voice mail to call back. 

## 2023-01-04 NOTE — Progress Notes (Deleted)
Cardiology Office Note    Date:  01/04/2023   ID:  Erika, Wu 1946-07-21, MRN [id number]  PCP:  Melida Quitter, MD  Cardiologist:  Shanon Seawright [place], MD    History of Present Illness:  Erika Wu is a 77 y.o. female seen for follow up  of chronic AI.   She is a former patient of Dr. Patty Wu. She also has a history of moderate aortic stenosis. She had a normal Myoview study in July 2016. She also had an Echo at that time showing Aortic root enlargement with moderate  AI. This was stable from prior exams. Seen in October 2017 with symptoms of chest pain and palpitations. Echo reported severe AI with dilated aorta of 4.4 cm. EF and LV dimensions were normal. Myoview study was abnormal with evidence of apical ischemia. This led to a cardiac cath in November 2017 showing minimal nonobstructive CAD. Normal LV function and normal LVEDP. She also wore an event monitor that was normal with no arrhythmia.   Echo in March 2021 showed normal EF with moderate AI.   Echocardiogram obtained on 01/23/2021 showed EF 65 to 70%, grade 1 DD, mild LVH, moderate to severe AI with moderate aortic stenosis, borderline dilated ascending aorta measuring at 39 mm.  CTA of the chest obtained on 03/30/2021 showed a  thoracic aortic aneurysm measuring at 4.5 cm.   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     HIDA scan obtained on 05/22/2021 was normal.  She was seen in September with complaints of intermittent palpitations. She wore an event monitor which showed some minor NSVT. She did have some episodes of SVT longest lasting 3 minutes. Metoprolol dose was increased. When seen in November symptoms were improved. She is followed by Dr Erika Wu for her thoracic aneurysm. Last measured at 4.7 cm. Echo repeated in  November 2022 and AI was unchanged.   She was seen in March 2023 with concerns of persistent HTN. Metoprolol was switched to Coreg 25 mg bid and continued on amlodipine and valsartan. Given hydralazine to take PRN.  In November she had surgical repair of a laceration and nerve injury in her right wrist.   On follow up today she is seen with her daughter. She reports her BP control has been very good. Typically BP 120-130 systolic. She denies any chest pain or SOB. Occasional minor palpitations. No edema. She is not walking a lot. Watches salt intake.    Past Medical History:  Diagnosis Date   Allergy    Anxiety    Arthritis    Chest pain    a. 03/2015 Lexiscan MV: EF 59%, no ischemia/infarct.   Colon polyps    COVID-19 virus infection    09-22-2020, fully vax'd    Depression    GERD (gastroesophageal reflux disease)    Heart murmur    Hemorrhoids    Hyperlipidemia    Hypertension    Moderate aortic insufficiency    a. 03/2015 Echo: EF 55-60%, no rwma, mild LVH, Gr 1 DD, mild AS, mod AI (bicuspid AoV by report, poorly visualized on this study), Asc Ao diam 44mm, triv MR.    Past Surgical History:  Procedure Laterality Date   CARDIAC CATHETERIZATION N/A 07/24/2016   Procedure: Left Heart Cath and Coronary Angiography;  Surgeon: Pranish Akhavan M [place], MD;  Location: [facility];  Service: Cardiovascular;  Laterality: N/A;   COLONOSCOPY     HAND DEBRIDEMENT  07/2022   right hand   NERVE, TENDON AND ARTERY REPAIR Right 08/07/2022   Procedure: NERVE, TENDON AND ARTERY REPAIR;  Surgeon: Erika Loa, MD;  Location: [facility] OR;  Service: Orthopedics;  Laterality: Right;   POLYPECTOMY     TONSILLECTOMY AND ADENOIDECTOMY     TUBAL LIGATION     WOUND EXPLORATION Right 08/07/2022   Procedure: WOUND EXPLORATION RIGHT WRIST;  Surgeon: Erika Loa, MD;  Location: [facility] OR;  Service: Orthopedics;  Laterality: Right;  60 MIN    Current Medications: Outpatient Medications Prior to Visit   Medication Sig Dispense Refill   amLODipine (NORVASC) 5 MG tablet Take 1 tablet (5 mg total) by mouth daily. 90 tablet 3   buPROPion (WELLBUTRIN XL) 150 MG 24 hr tablet Take 450 mg by mouth every morning.  3   carvedilol (COREG) 25 MG tablet Take 1 tablet (25 mg total) by mouth 2 (two) times daily. 180 tablet 1   CREON 36000-114000 units CPEP capsule TAKE 2 CAPSULES BY MOUTH THREE TIMES DAILY WITH MEALS 180 capsule 0   famotidine (PEPCID) 40 MG tablet Take 1 tablet (40 mg total) by mouth at bedtime. 90 tablet 1   hydrALAZINE (APRESOLINE) 10 MG tablet Take 1 tablet (10 mg total) by mouth as needed (as needed for systolic Blood pressures greater than 170). (Patient not taking: Reported on 07/31/2022) 30 tablet 1   hydrocortisone (ANUSOL-HC) 25 MG suppository Place 1 suppository (25 mg total) rectally daily for 7 days. Then daily as needed. 14 suppository 1   mirtazapine (REMERON) 30 MG tablet Take 30 mg by mouth at bedtime.     pantoprazole (PROTONIX) 40 MG tablet Take 1 tablet (40 mg total) by mouth 2 (two) times daily before a meal. 180 tablet 3   rosuvastatin (CRESTOR) 10 MG tablet Take 1 tablet by mouth daily.     Suvorexant (BELSOMRA) 20 MG TABS Take 20 mg by mouth daily.     valsartan (DIOVAN) 80 MG tablet Take 1 tablet (80 mg total) by mouth 2 (two) times daily. 180 tablet 3   No facility-administered medications prior to visit.     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  Tobacco Use   Smoking status: Former    Packs/day: 0.50    Years: 15.00    Additional pack years: 0.00    Total pack years: 7.50    Types: Cigarettes   Smokeless tobacco: Never  Vaping Use   Vaping Use: Never  used  Substance and Sexual Activity   Alcohol use: Yes    Alcohol/week: 0.0 standard drinks of alcohol    Comment: 2-3 per day   Drug use: No   Sexual activity: Not on file  Other Topics Concern   Not on file  Social History Narrative   Not on file   Social Determinants of Health   Financial Resource Strain: Not on file  Food Insecurity: Not on file  Transportation Needs: Not on file  Physical Activity: Not on file  Stress: Not on  file  Social Connections: Not on file     Family History:  The patient's family history includes Colon cancer in her maternal grandmother; Diverticulosis in her maternal grandfather; HIV in her brother; Heart attack in her father; Heart disease in her father; Stomach cancer in her maternal grandmother; Stroke in her paternal grandfather.   ROS:   Please see the history of present illness.    ROS All other systems reviewed and are negative.   PHYSICAL EXAM:   VS:  There were no vitals taken for this visit.   GEN: Well nourished, well developed, in no acute distress  HEENT: normal  Neck: no JVD, carotid bruits, or masses Cardiac: RRR; there is a gr 1-2/6 systolic ejection murmur RUSB with a blowing 2-3/6 diastolic murmur, no edema  Respiratory:  clear to auscultation bilaterally, normal work of breathing GI: soft, nontender, nondistended, + BS MS: no deformity or atrophy  Skin: warm and dry, no rash Neuro:  Alert and Oriented x 3, Strength and sensation are intact Psych: euthymic mood, full affect  Wt Readings from Last 3 Encounters:  10/16/22 124 lb (56.2 kg)  10/05/22 124 lb (56.2 kg)  08/07/22 130 lb (59 kg)      Studies/Labs Reviewed:   EKG:  EKG is  ordered today.  NSR rate 54. LVH with QRS widening. Old inferior infarct. I have personally reviewed and interpreted this study.  Recent Labs: 09/19/2022: ALT 18; BUN 13; Creatinine, Ser 0.68; Hemoglobin 13.0; Platelets 229.0; Potassium 3.9; Sodium 143   Lipid Panel    Component Value  Date/Time   CHOL 190 07/19/2016 1050   TRIG 77 07/19/2016 1050   HDL 86 07/19/2016 1050   CHOLHDL 2.2 07/19/2016 1050   VLDL 15 07/19/2016 1050   LDLCALC 89 07/19/2016 1050  Dated 04/14/20: normal CBC Dated 11/14/20: cholesterol 198, triglycerides 80, HDL 77, LDL 105. CMET and TSH normal Dated 11/16/21: cholesterol 142, triglycerides 61, HDL 60, LDL 70. CMET and TSH normal Dated 09/19/22: normal CBC.  Dated 12/25/22: cholesterol 144, triglycerides 66, HDL 73, LDL 58, CMET and TSH normal.    Additional studies/ records that were reviewed today include:  Myoview 07/13/16:Study Highlights   Nuclear stress EF: 60%. The left ventricular ejection fraction is normal (55-65%). There was no ST segment deviation noted during stress. There is a small defect of mild severity present in the apical lateral and apical location. The defect is reversible and is consistent with a small area of ischemia although shifting breast attenuation artifact. is also a possiblity. This is an intermediate risk study due to apical reversible defect.   Echo: 07/23/16:Study Conclusions   - Left ventricle: The cavity size was normal. There was mild focal   basal hypertrophy of the septum. Systolic function was normal.   The estimated ejection fraction was in the range of 60% to 65%.   Wall motion was normal; there were no regional wall motion   abnormalities. - Aortic valve: Probably trileaflet; moderately thickened,   moderately calcified leaflets. Transvalvular velocity was   minimally increased. There was no stenosis. There was severe   regurgitation. - Aorta: Ascending aortic diameter: 44 mm (S). - Aortic root: The aortic root was moderately dilated. - Mitral valve: There was mild regurgitation.  Cardiac cath 07/24/16:Conclusion     The left ventricular systolic function is normal. LV end diastolic pressure is normal. The left ventricular ejection fraction is 55-65% by visual estimate. Prox LAD lesion, 20  %stenosed. Prox RCA to Mid RCA  lesion, 10 %stenosed.   1. Mild nonobstructive CAD 2. Normal LV function 3. Normal LVEDP   Plan: medical management.   Event monitor November 2017: normal   Echo 11/17/19: IMPRESSIONS     1. Left ventricular ejection fraction, by estimation, is 60 to 65%. The  left ventricle has normal function. The left ventricle has no regional  wall motion abnormalities. There is severe asymmetric left ventricular  hypertrophy of the basal-septal  segment. Left ventricular diastolic parameters are indeterminate.   2. Right ventricular systolic function is normal. The right ventricular  size is normal. Tricuspid regurgitation signal is inadequate for assessing  PA pressure.   3. The mitral valve is normal in structure. Trivial mitral valve  regurgitation.   4. The aortic valve was not well visualized. Aortic valve regurgitation  is moderate. No aortic stenosis is present.   5. Aortic dilatation noted. There is dilatation of the ascending aorta  measuring 44 mm.   6. The inferior vena cava is normal in size with greater than 50%  respiratory variability, suggesting right atrial pressure of 3 mmHg.   Cardiac monitor 06/2021: Normal sinus rhythm 6 episode of nonsustained VT 4-7 beats Episodes of SVT- appear reentrant with sudden onset and termination. longest lasting 2 minutes and 54 seconds at rate 156 bpm     Patch Wear Time:  14 days and 0 hours (2022-09-29T15:04:27-0400 to 2022-10-13T15:04:27-0400)   Patient had a min HR of 50 bpm, max HR of 188 bpm, and avg HR of 69 bpm. Predominant underlying rhythm was Sinus Rhythm. 6 Ventricular Tachycardia runs occurred, the run with the fastest interval lasting 4 beats with a max rate of 188 bpm, the longest  lasting 7 beats with an avg rate of 128 bpm. 18 Supraventricular Tachycardia runs occurred, the run with the fastest interval lasting 2 mins 54 secs with a max rate of 156 bpm (avg 143 bpm); the run with the fastest  interval was also the longest.  Supraventricular Tachycardia was detected within +/- 45 seconds of symptomatic patient event(s). Isolated SVEs were rare (<1.0%), SVE Couplets were rare (<1.0%), and SVE Triplets were rare (<1.0%). Isolated VEs were rare (<1.0%, 2958), VE Couplets were  rare (<1.0%, 115), and VE Triplets were rare (<1.0%, 7).      Echocardiogram 01/2021: 1. Left ventricular ejection fraction, by estimation, is 65 to 70%. The  left ventricle has normal function. The left ventricle has no regional  wall motion abnormalities. There is mild left ventricular hypertrophy.  Left ventricular diastolic parameters  are consistent with Grade I diastolic dysfunction (impaired relaxation).   2. Right ventricular systolic function is normal. The right ventricular  size is normal.   3. The mitral valve is normal in structure. No evidence of mitral valve  regurgitation. No evidence of mitral stenosis.   4. The aortic valve is normal in structure. Aortic valve regurgitation is  severe. Moderate aortic valve stenosis. Aortic regurgitation PHT measures  415 msec. Aortic valve mean gradient measures 18.0 mmHg. Aortic valve Vmax  measures 3.04 m/s.   5. There is borderline dilatation of the ascending aorta, measuring 39  mm.   6. The inferior vena cava is normal in size with greater than 50%  respiratory variability, suggesting right atrial pressure of 3 mmHg.   Echo 08/01/21: IMPRESSIONS     1. Left ventricular ejection fraction, by estimation, is 60 to 65%. Left  ventricular ejection fraction by 3D volume is 65 %. The left ventricle has  normal function.  The left ventricle has no regional wall motion  abnormalities. There is moderate left  ventricular hypertrophy. Left ventricular diastolic parameters are  consistent with Grade I diastolic dysfunction (impaired relaxation).   2. Right ventricular systolic function is normal. The right ventricular  size is normal. Tricuspid regurgitation  signal is inadequate for assessing  PA pressure.   3. Left atrial size was severely dilated.   4. The pericardial effusion is anterior to the right ventricle.   5. The mitral valve is degenerative. Mild mitral valve regurgitation. No  evidence of mitral stenosis.   6. The aortic valve is calcified. Aortic valve regurgitation is moderate  to severe. Moderate aortic valve stenosis. Aortic regurgitation PHT  measures 492 msec. Aortic valve area, by VTI measures 1.00 cm. Aortic  valve mean gradient measures 19.0 mmHg.  Aortic valve Vmax measures 3.01 m/s.   7. Aortic dilatation noted. There is mild dilatation of the aortic root,  measuring 39 mm.   8. The inferior vena cava is normal in size with greater than 50%  respiratory variability, suggesting right atrial pressure of 3 mmHg.   Comparison(s): EF 65%, moderate AS with severe AI, mean gradient 18 mmHg,  peak 36.58mmHg, Ascend Aor 39mm, AI PHT 415 msec, mild MAC. Compared to  study dated 01/23/2021, The mean AVG has increased from 18 to .  Visually AI appears moderate to severe  but only mild by PHT. Echo images are suboptimol. Consider TEE of the AV  if clinically indicated.     EXAM: CT ANGIOGRAPHY CHEST WITH CONTRAST   TECHNIQUE: Multidetector CT imaging of the chest was performed using the standard protocol during bolus administration of intravenous contrast. Multiplanar CT image reconstructions and MIPs were obtained to evaluate the vascular anatomy.   RADIATION DOSE REDUCTION: This exam was performed according to the departmental dose-optimization program which includes automated exposure control, adjustment of the mA and/or kV according to patient size and/or use of iterative reconstruction technique.   CONTRAST:  75mL OMNIPAQUE IOHEXOL 350 MG/ML SOLN   COMPARISON:  CT angiography chest 09/26/2021   FINDINGS: Cardiovascular: Preferential opacification of the pulmonary artery. Stable aneurysmal ascending thoracic  aorta measuring up to 4.5 cm in caliber. The descending thoracic aorta is normal in caliber. No dissection. Mild atherosclerotic plaque of the thoracic aorta. At least 2 vessel coronary artery calcifications. Normal heart size. No significant pericardial effusion. The main pulmonary artery is normal in caliber. No pulmonary embolus.   Mediastinum/Nodes: No enlarged mediastinal, hilar, or axillary lymph nodes. Thyroid gland, trachea, and esophagus demonstrate no significant findings.   Lungs/Pleura: Bilateral lower lobe linear atelectasis versus scarring. No focal consolidation. No pulmonary nodule. No pulmonary mass. No pleural effusion. No pneumothorax.   Upper Abdomen: No acute abnormality.   Musculoskeletal:   No chest wall abnormality.   No suspicious lytic or blastic osseous lesions. No acute displaced fracture. Multilevel degenerative changes of the spine.   Review of the MIP images confirms the above findings.   IMPRESSION: 1. Stable aneurysmal ascending thoracic aorta (4.5 cm). Ascending thoracic aortic aneurysm. Recommend semi-annual imaging followup by CTA or MRA and referral to cardiothoracic surgery if not already obtained. This recommendation follows 2010 ACCF/AHA/AATS/ACR/ASA/SCA/SCAI/SIR/STS/SVM Guidelines for the Diagnosis and Management of Patients With Thoracic Aortic Disease. Circulation. 2010; 121: W098-J191. Aortic aneurysm NOS (ICD10-I71.9). 2. Aortic Atherosclerosis (ICD10-I70.0) with at least 2 vessel coronary calcifications. 3. No acute intrapulmonary abnormality.     Electronically Signed   By: Tish Frederickson M.D.   On: 03/12/2022  15:18  Echo 03/07/22: IMPRESSIONS     1. Left ventricular ejection fraction, by estimation, is 60 to 65%. The  left ventricle has normal function. The left ventricle has no regional  wall motion abnormalities. The left ventricular internal cavity size was  mildly dilated. There is severe  concentric left  ventricular hypertrophy of the basal-septal segment. Left  ventricular diastolic parameters are consistent with Grade I diastolic  dysfunction (impaired relaxation).   2. Right ventricular systolic function is normal. The right ventricular  size is normal. There is normal pulmonary artery systolic pressure. The  estimated right ventricular systolic pressure is 19.0 mmHg.   3. The mitral valve is degenerative. Mild mitral valve regurgitation. No  evidence of mitral stenosis.   4. The aortic valve is abnormal. Aortic valve regurgitation is severe.  Aortic valve area, by VTI measures 1.29 cm, Aortic valve mean gradient  measures 28.0 mmHg, Aortic valve Vmax measures 3.83 m/s, DVI is 0.41 but  visually the AV does not appear  stenotic. 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 Cannot get accurate waveforms to demonstrate LVOT  gradient compared to AV gradient. The AV   has been reported as moderate AS in the past but in the M Mode and  visually on 2D images the leaflets appear to open well with some AV  sclerosis. Suspect that increased AV gradient and peak velocities are due  in part to increased flow across AV from AI  as well as increased velocity in LVOT from severe BSH.   5. Aortic dilatation noted. Aneurysm of the ascending aorta, measuring 45  mm.   6. The inferior vena cava is normal in size with greater than 50%  respiratory variability, suggesting right atrial pressure of 3 mmHg.   7. Recommend TEE to get planimetry of the AV for accurate measurement of  the AVA as well as assess severity of AI further given LV dilatation.   ASSESSMENT:    No diagnosis found.    PLAN:  In order of problems listed above:  1. Hypertension: BP is now well controlled on valsartan, amlodipine and Coreg.  Continue sodium restriction.  2. Non-obstructive CAD: Cath in 2017 showed  mild nonobstructive CAD (pLAD 20%, p-m RCA 10%).  Recent CT chest showed mild atherosclerosis, coronary artery calcification.   Continue Crestor.  3. Moderate-severe AI/aortic stenosis: Most recent recent echocardiogram in June showed EF 60 to 65%, without LV enlargement.  Severe AI. She is asymptomatic. Continue to focus on optimizing BP. Given stability on Echo I think we can follow Echo yearly unless she becomes symptomatic. Indication for surgery include development of symptoms or evidence of LV enlargement/dysfunction.  4. Ascending aortic aneurysm: CT chest in January 2023 showed known ascending aortic aneurysm measuring 4.7 cm. Repeat on July 3 measured 4.5 cm. No change. Follows with Dr Erika Wu. Continue optimal BP control. Recommend avoidance of quinolone antibiotics.   5. PSVT/syncope: Outpatient monitor in 06/2021 in the setting of intermittent tachypalpitations showed PSVT, longest episode lasting 2 minutes 34 seconds.  no sustained arrhythmia. Continue Coreg.   6. Hyperlipidemia: LDL was 70 last week.  Continue Crestor.     Medication Adjustments/Labs and Tests Ordered: Current medicines are reviewed at length with the patient today.  Concerns regarding medicines are outlined above.  Medication changes, Labs and Tests ordered today are listed in the Patient Instructions below. There are  no Patient Instructions on file for this visit.    Signed, Andres Bantz [place], MD  01/04/2023 11:43 AM    [facility] [address] [place], [place], [address] [phone]

## 2023-01-07 ENCOUNTER — Ambulatory Visit: Payer: Medicare PPO | Attending: Cardiology | Admitting: Cardiology

## 2023-01-08 ENCOUNTER — Telehealth: Payer: Self-pay

## 2023-01-08 NOTE — Telephone Encounter (Signed)
Spoke to patient advised I was calling due to missed appointment with Dr.Jordan yesterday 4/29.Stated she did not know she had appointment.Stated she had a episode of chest tightness recently.She also has passed out 3 times since last Nov 11/23.PCP advised her to see Dr.Jordan.No chest tightness in the past 3 to 4 days.Appointment scheduled with Bernadene Person NP 5/2 at 2::45 pm.

## 2023-01-09 ENCOUNTER — Encounter: Payer: Self-pay | Admitting: Cardiology

## 2023-01-10 ENCOUNTER — Encounter: Payer: Self-pay | Admitting: Cardiology

## 2023-01-10 ENCOUNTER — Ambulatory Visit: Payer: Medicare PPO | Attending: Cardiology | Admitting: Cardiology

## 2023-01-10 ENCOUNTER — Ambulatory Visit: Payer: Medicare PPO | Admitting: Nurse Practitioner

## 2023-01-10 VITALS — BP 109/52 | HR 59 | Ht 63.0 in | Wt 124.0 lb

## 2023-01-10 DIAGNOSIS — I351 Nonrheumatic aortic (valve) insufficiency: Secondary | ICD-10-CM

## 2023-01-10 DIAGNOSIS — E785 Hyperlipidemia, unspecified: Secondary | ICD-10-CM

## 2023-01-10 DIAGNOSIS — I10 Essential (primary) hypertension: Secondary | ICD-10-CM

## 2023-01-10 DIAGNOSIS — R55 Syncope and collapse: Secondary | ICD-10-CM

## 2023-01-10 DIAGNOSIS — I7121 Aneurysm of the ascending aorta, without rupture: Secondary | ICD-10-CM | POA: Diagnosis not present

## 2023-01-10 NOTE — Patient Instructions (Signed)
Medication Instructions:  No Changes *If you need a refill on your cardiac medications before your next appointment, please call your pharmacy*   Lab Work: No Labs If you have labs (blood work) drawn today and your tests are completely normal, you will receive your results only by: MyChart Message (if you have MyChart) OR A paper copy in the mail If you have any lab test that is abnormal or we need to change your treatment, we will call you to review the results.   Testing/Procedures: [address], [address]. Your physician has requested that you have an echocardiogram. Echocardiography is a painless test that uses sound waves to create images of your heart. It provides your doctor with information about the size and shape of your heart and how well your heart's chambers and valves are working. This procedure takes approximately one hour. There are no restrictions for this procedure. Please do NOT wear cologne, perfume, aftershave, or lotions (deodorant is allowed). Please arrive 15 minutes prior to your appointment time.    Follow-Up: At [facility], you and your health needs are our priority.  As part of our continuing mission to provide you with exceptional heart care, we have created designated Provider Care Teams.  These Care Teams include your primary Cardiologist (physician) and Advanced Practice Providers (APPs -  Physician Assistants and Nurse Practitioners) who all work together to provide you with the care you need, when you need it.  We recommend signing up for the patient portal called "MyChart".  Sign up information is provided on this After Visit Summary.  MyChart is used to connect with patients for Virtual Visits (Telemedicine).  Patients are able to view lab/test results, encounter notes, upcoming appointments, etc.  Non-urgent messages can be sent to your provider as well.   To learn more about what you can do with MyChart, go to  [email or web address].    Your next appointment:   6 month(s)  Provider:   Peter Jordan, MD    

## 2023-01-10 NOTE — Progress Notes (Signed)
Cardiology Office Note    Date:  01/10/2023   ID:  Erika Wu, Erika Wu Erika Wu, MRN [id number]  PCP:  Erika Quitter, MD  Cardiologist:  Erika Kreager [place], MD    History of Present Illness:  Erika Wu is a 77 y.o. female seen for follow up  of chronic AI.   She is a former patient of Erika Wu. She also has a history of moderate aortic stenosis. She had a normal Myoview study in July 2016. She also had an Echo at that time showing Aortic root enlargement with moderate  AI. This was stable from prior exams. Seen in October 2017 with symptoms of chest pain and palpitations. Echo reported severe AI with dilated aorta of 4.4 cm. EF and LV dimensions were normal. Myoview study was abnormal with evidence of apical ischemia. This led to a cardiac cath in November 2017 showing minimal nonobstructive CAD. Normal LV function and normal LVEDP. She also wore an event monitor that was normal with no arrhythmia.   Echo in March 2021 showed normal EF with moderate AI.   Echocardiogram obtained on 01/23/2021 showed EF 65 to 70%, grade 1 DD, mild LVH, moderate to severe AI with moderate aortic stenosis, borderline dilated ascending aorta measuring at 39 mm.  CTA of the chest obtained on 03/30/2021 showed a  thoracic aortic aneurysm measuring at 4.5 cm.   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     HIDA scan obtained on 05/22/2021 was normal.  She was seen in September with complaints of intermittent palpitations. She wore an event monitor which showed some minor NSVT. She did have some episodes of SVT longest lasting 3 minutes. Metoprolol dose was increased. When seen in November symptoms were improved. She is followed by Dr Erika Wu for her thoracic aneurysm. Last measured at 4.7 cm. Echo repeated in  November 2022 and AI was unchanged.   She was seen in March 2023 with concerns of persistent HTN. Metoprolol was switched to Coreg 25 mg bid and continued on amlodipine and valsartan. Given hydralazine to take PRN.  In November she had surgical repair of a laceration and nerve injury in her right wrist. She reports she has had 3 fainting spells since then. One occurred when she lacerated her wrist. The next occurred when she cut her finger in the kitchen and there was a lot of blood. The last episode occurred after she tripped and fell and hit a hard floor. When her husband got her up to a chair she fainted briefly. She otherwise denies any dizziness or palpitation. No dyspnea or edema. Follow up CT in February showed stable aortic size. Last Echo in Dec showed moderate to severe AI with stable LV size and function.    Past Medical History:  Diagnosis Date   Allergy    Anxiety    Arthritis    Chest pain    a. 03/2015 Lexiscan MV: EF 59%, no ischemia/infarct.   Colon polyps    COVID-19 virus infection    09-22-2020, fully vax'd    Depression    GERD (gastroesophageal reflux disease)    Heart murmur    Hemorrhoids    Hyperlipidemia    Hypertension    Moderate aortic insufficiency    a. 03/2015 Echo: EF 55-60%, no rwma, mild LVH, Gr 1 DD,  mild AS, mod AI (bicuspid AoV by report, poorly visualized on this study), Asc Ao diam 44mm, triv MR.    Past Surgical History:  Procedure Laterality Date   CARDIAC CATHETERIZATION N/A 07/24/2016   Procedure: Left Heart Cath and Coronary Angiography;  Surgeon: Erika Dais M [place], MD;  Location: [facility]AS[facility];  Service: Cardiovascular;  Laterality: N/A;   COLONOSCOPY     HAND DEBRIDEMENT  07/2022   right hand   NERVE, TENDON AND ARTERY REPAIR Right 08/07/2022   Procedure: NERVE, TENDON AND ARTERY REPAIR;  Surgeon: Erika Loa, MD;  Location: [facility] OR;  Service: Orthopedics;  Laterality: Right;   POLYPECTOMY     TONSILLECTOMY AND ADENOIDECTOMY      TUBAL LIGATION     WOUND EXPLORATION Right 08/07/2022   Procedure: WOUND EXPLORATION RIGHT WRIST;  Surgeon: Erika Loa, MD;  Location: [facility] OR;  Service: Orthopedics;  Laterality: Right;  60 MIN    Current Medications: Outpatient Medications Prior to Visit  Medication Sig Dispense Refill   amLODipine (NORVASC) 5 MG tablet Take 1 tablet (5 mg total) by mouth daily. 90 tablet 3   buPROPion (WELLBUTRIN XL) 150 MG 24 hr tablet Take 450 mg by mouth every morning.  3   carvedilol (COREG) 25 MG tablet Take 1 tablet (25 mg total) by mouth 2 (two) times daily. 180 tablet 1   famotidine (PEPCID) 40 MG tablet Take 1 tablet (40 mg total) by mouth at bedtime. 90 tablet 1   hydrALAZINE (APRESOLINE) 10 MG tablet Take 1 tablet (10 mg total) by mouth as needed (as needed for systolic Blood pressures greater than 170). 30 tablet 1   mirtazapine (REMERON) 30 MG tablet Take 30 mg by mouth at bedtime.     pantoprazole (PROTONIX) 40 MG tablet Take 1 tablet (40 mg total) by mouth 2 (two) times daily before a meal. 180 tablet 3   rosuvastatin (CRESTOR) 10 MG tablet Take 1 tablet by mouth daily.     Suvorexant (BELSOMRA) 20 MG TABS Take 20 mg by mouth daily.     valsartan (DIOVAN) 80 MG tablet Take 1 tablet (80 mg total) by mouth 2 (two) times daily. 180 tablet 3   CREON 36000-114000 units CPEP capsule TAKE 2 CAPSULES BY MOUTH THREE TIMES DAILY WITH MEALS (Patient not taking: Reported on 01/10/2023) 180 capsule 0   hydrocortisone (ANUSOL-HC) 25 MG suppository Place 1 suppository (25 mg total) rectally daily for 7 days. Then daily as needed. (Patient not taking: Reported on 01/10/2023) 14 suppository 1   No facility-administered medications prior to visit.     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  Tobacco Use   Smoking status: Former    Packs/day: 0.50    Years: 15.00    Additional pack years: 0.00    Total pack years: 7.50    Types: Cigarettes   Smokeless tobacco: Never  Vaping Use   Vaping Use: Never used  Substance and Sexual Activity   Alcohol use: Yes    Alcohol/week: 0.0 standard drinks of alcohol    Comment: 2-3 per day   Drug use: No   Sexual activity:  Not on file  Other Topics Concern   Not on file  Social History Narrative   Not on file   Social Determinants of Health   Financial Resource Strain: Not on file  Food Insecurity: Not on file  Transportation Needs: Not on file  Physical Activity: Not on file  Stress: Not on file  Social Connections: Not on file     Family History:  The patient's family history includes Colon cancer in her maternal grandmother; Diverticulosis in her maternal grandfather; HIV in her brother; Heart attack in her father; Heart disease in her father; Stomach cancer in her maternal grandmother; Stroke in her paternal grandfather.   ROS:   Please see the history of present illness.    ROS All other systems reviewed and are negative.   PHYSICAL EXAM:   VS:  BP (!) 109/52 (BP Location: Left Arm, Patient Position: Sitting, Cuff Size: Normal)   Pulse (!) 59   Ht 5\' 3"  (1.6 m)   Wt 124 lb (56.2 kg)   BMI 21.97 kg/m    GEN: Well nourished, well developed, in no acute distress  HEENT: normal  Neck: no JVD, carotid bruits, or masses Cardiac: RRR; there is a gr 1-2/6 systolic ejection murmur RUSB with a blowing 3/6 diastolic murmur, no edema  Respiratory:  clear to auscultation bilaterally, normal work of breathing GI: soft, nontender, nondistended, + BS MS: no deformity or atrophy  Skin: warm and dry, no rash Neuro:  Alert and Oriented x 3, Strength and sensation are intact Psych: euthymic mood, full affect  Wt Readings from Last 3 Encounters:   01/10/23 124 lb (56.2 kg)  10/16/22 124 lb (56.2 kg)  10/05/22 124 lb (56.2 kg)      Studies/Labs Reviewed:   EKG:  EKG is  ordered today.  NSR rate 59. LVH with QRS widening.  Old inferior infarct. I have personally reviewed and interpreted this study.  Recent Labs: 09/19/2022: ALT 18; BUN 13; Creatinine, Ser 0.68; Hemoglobin 13.0; Platelets 229.0; Potassium 3.9; Sodium 143   Lipid Panel    Component Value Date/Time   CHOL 190 07/19/2016 1050   TRIG 77 07/19/2016 1050   HDL 86 07/19/2016 1050   CHOLHDL 2.2 07/19/2016 1050   VLDL 15 07/19/2016 1050   LDLCALC 89 07/19/2016 1050  Dated 04/14/20: normal CBC Dated 11/14/20: cholesterol 198, triglycerides 80, HDL 77, LDL 105. CMET and TSH normal Dated 11/16/21: cholesterol 142, triglycerides 61, HDL 60, LDL 70. CMET and TSH normal Dated 09/19/22: normal CBC.  Dated 12/25/22: cholesterol 144, triglycerides 66, HDL 73, LDL 58, CMET and TSH normal.    Additional studies/ records that were reviewed today include:  Myoview 07/13/16:Study Highlights   Nuclear stress EF: 60%. The left ventricular ejection fraction is normal (55-65%). There was no ST segment deviation noted during stress. There is a small defect of mild severity present in the apical lateral and apical location. The defect is reversible and is consistent with a small area of ischemia although shifting breast attenuation artifact. is also a possiblity. This is an intermediate risk study due to apical reversible defect.   Echo: 07/23/16:Study Conclusions   - Left ventricle: The cavity size was normal. There was mild focal   basal hypertrophy of the septum. Systolic function was normal.   The estimated ejection fraction was in the range of 60% to 65%.   Wall motion was normal; there were no regional wall motion   abnormalities. - Aortic valve: Probably trileaflet; moderately thickened,  moderately calcified leaflets. Transvalvular velocity was   minimally increased. There was  no stenosis. There was severe   regurgitation. - Aorta: Ascending aortic diameter: 44 mm (S). - Aortic root: The aortic root was moderately dilated. - Mitral valve: There was mild regurgitation.  Cardiac cath 07/24/16:Conclusion     The left ventricular systolic function is normal. LV end diastolic pressure is normal. The left ventricular ejection fraction is 55-65% by visual estimate. Prox LAD lesion, 20 %stenosed. Prox RCA to Mid RCA lesion, 10 %stenosed.   1. Mild nonobstructive CAD 2. Normal LV function 3. Normal LVEDP   Plan: medical management.   Event monitor November 2017: normal   Echo 11/17/19: IMPRESSIONS     1. Left ventricular ejection fraction, by estimation, is 60 to 65%. The  left ventricle has normal function. The left ventricle has no regional  wall motion abnormalities. There is severe asymmetric left ventricular  hypertrophy of the basal-septal  segment. Left ventricular diastolic parameters are indeterminate.   2. Right ventricular systolic function is normal. The right ventricular  size is normal. Tricuspid regurgitation signal is inadequate for assessing  PA pressure.   3. The mitral valve is normal in structure. Trivial mitral valve  regurgitation.   4. The aortic valve was not well visualized. Aortic valve regurgitation  is moderate. No aortic stenosis is present.   5. Aortic dilatation noted. There is dilatation of the ascending aorta  measuring 44 mm.   6. The inferior vena cava is normal in size with greater than 50%  respiratory variability, suggesting right atrial pressure of 3 mmHg.   Cardiac monitor 06/2021: Normal sinus rhythm 6 episode of nonsustained VT 4-7 beats Episodes of SVT- appear reentrant with sudden onset and termination. longest lasting 2 minutes and 54 seconds at rate 156 bpm     Patch Wear Time:  14 days and 0 hours (2022-09-29T15:04:27-0400 to 2022-10-13T15:04:27-0400)   Patient had a min HR of 50 bpm, max HR of 188 bpm,  and avg HR of 69 bpm. Predominant underlying rhythm was Sinus Rhythm. 6 Ventricular Tachycardia runs occurred, the run with the fastest interval lasting 4 beats with a max rate of 188 bpm, the longest  lasting 7 beats with an avg rate of 128 bpm. 18 Supraventricular Tachycardia runs occurred, the run with the fastest interval lasting 2 mins 54 secs with a max rate of 156 bpm (avg 143 bpm); the run with the fastest interval was also the longest.  Supraventricular Tachycardia was detected within +/- 45 seconds of symptomatic patient event(s). Isolated SVEs were rare (<1.0%), SVE Couplets were rare (<1.0%), and SVE Triplets were rare (<1.0%). Isolated VEs were rare (<1.0%, 2958), VE Couplets were  rare (<1.0%, 115), and VE Triplets were rare (<1.0%, 7).      Echocardiogram 01/2021: 1. Left ventricular ejection fraction, by estimation, is 65 to 70%. The  left ventricle has normal function. The left ventricle has no regional  wall motion abnormalities. There is mild left ventricular hypertrophy.  Left ventricular diastolic parameters  are consistent with Grade I diastolic dysfunction (impaired relaxation).   2. Right ventricular systolic function is normal. The right ventricular  size is normal.   3. The mitral valve is normal in structure. No evidence of mitral valve  regurgitation. No evidence of mitral stenosis.   4. The aortic valve is normal in structure. Aortic valve regurgitation is  severe. Moderate aortic valve stenosis. Aortic regurgitation PHT measures  415 msec. Aortic valve mean gradient measures 18.0  mmHg. Aortic valve Vmax  measures 3.04 m/s.   5. There is borderline dilatation of the ascending aorta, measuring 39  mm.   6. The inferior vena cava is normal in size with greater than 50%  respiratory variability, suggesting right atrial pressure of 3 mmHg.   Echo 08/01/21: IMPRESSIONS     1. Left ventricular ejection fraction, by estimation, is 60 to 65%. Left  ventricular  ejection fraction by 3D volume is 65 %. The left ventricle has  normal function. The left ventricle has no regional wall motion  abnormalities. There is moderate left  ventricular hypertrophy. Left ventricular diastolic parameters are  consistent with Grade I diastolic dysfunction (impaired relaxation).   2. Right ventricular systolic function is normal. The right ventricular  size is normal. Tricuspid regurgitation signal is inadequate for assessing  PA pressure.   3. Left atrial size was severely dilated.   4. The pericardial effusion is anterior to the right ventricle.   5. The mitral valve is degenerative. Mild mitral valve regurgitation. No  evidence of mitral stenosis.   6. The aortic valve is calcified. Aortic valve regurgitation is moderate  to severe. Moderate aortic valve stenosis. Aortic regurgitation PHT  measures 492 msec. Aortic valve area, by VTI measures 1.00 cm. Aortic  valve mean gradient measures 19.0 mmHg.  Aortic valve Vmax measures 3.01 m/s.   7. Aortic dilatation noted. There is mild dilatation of the aortic root,  measuring 39 mm.   8. The inferior vena cava is normal in size with greater than 50%  respiratory variability, suggesting right atrial pressure of 3 mmHg.   Comparison(s): EF 65%, moderate AS with severe AI, mean gradient 18 mmHg,  peak 36.30mmHg, Ascend Aor 39mm, AI PHT 415 msec, mild MAC. Compared to  study dated 01/23/2021, The mean AVG has increased from 18 to .  Visually AI appears moderate to severe  but only mild by PHT. Echo images are suboptimol. Consider TEE of the AV  if clinically indicated.     EXAM: CT ANGIOGRAPHY CHEST WITH CONTRAST   TECHNIQUE: Multidetector CT imaging of the chest was performed using the standard protocol during bolus administration of intravenous contrast. Multiplanar CT image reconstructions and MIPs were obtained to evaluate the vascular anatomy.   RADIATION DOSE REDUCTION: This exam was performed  according to the departmental dose-optimization program which includes automated exposure control, adjustment of the mA and/or kV according to patient size and/or use of iterative reconstruction technique.   CONTRAST:  75mL OMNIPAQUE IOHEXOL 350 MG/ML SOLN   COMPARISON:  CT angiography chest 09/26/2021   FINDINGS: Cardiovascular: Preferential opacification of the pulmonary artery. Stable aneurysmal ascending thoracic aorta measuring up to 4.5 cm in caliber. The descending thoracic aorta is normal in caliber. No dissection. Mild atherosclerotic plaque of the thoracic aorta. At least 2 vessel coronary artery calcifications. Normal heart size. No significant pericardial effusion. The main pulmonary artery is normal in caliber. No pulmonary embolus.   Mediastinum/Nodes: No enlarged mediastinal, hilar, or axillary lymph nodes. Thyroid gland, trachea, and esophagus demonstrate no significant findings.   Lungs/Pleura: Bilateral lower lobe linear atelectasis versus scarring. No focal consolidation. No pulmonary nodule. No pulmonary mass. No pleural effusion. No pneumothorax.   Upper Abdomen: No acute abnormality.   Musculoskeletal:   No chest wall abnormality.   No suspicious lytic or blastic osseous lesions. No acute displaced fracture. Multilevel degenerative changes of the spine.   Review of the MIP images confirms the above findings.   IMPRESSION: 1.  Stable aneurysmal ascending thoracic aorta (4.5 cm). Ascending thoracic aortic aneurysm. Recommend semi-annual imaging followup by CTA or MRA and referral to cardiothoracic surgery if not already obtained. This recommendation follows 2010 ACCF/AHA/AATS/ACR/ASA/SCA/SCAI/SIR/STS/SVM Guidelines for the Diagnosis and Management of Patients With Thoracic Aortic Disease. Circulation. 2010; 121: Z610-R604. Aortic aneurysm NOS (ICD10-I71.9). 2. Aortic Atherosclerosis (ICD10-I70.0) with at least 2 vessel coronary calcifications. 3. No  acute intrapulmonary abnormality.     Electronically Signed   By: Tish Frederickson M.D.   On: 03/12/2022 15:18  Echo 03/07/22: IMPRESSIONS     1. Left ventricular ejection fraction, by estimation, is 60 to 65%. The  left ventricle has normal function. The left ventricle has no regional  wall motion abnormalities. The left ventricular internal cavity size was  mildly dilated. There is severe  concentric left ventricular hypertrophy of the basal-septal segment. Left  ventricular diastolic parameters are consistent with Grade I diastolic  dysfunction (impaired relaxation).   2. Right ventricular systolic function is normal. The right ventricular  size is normal. There is normal pulmonary artery systolic pressure. The  estimated right ventricular systolic pressure is 19.0 mmHg.   3. The mitral valve is degenerative. Mild mitral valve regurgitation. No  evidence of mitral stenosis.   4. The aortic valve is abnormal. Aortic valve regurgitation is severe.  Aortic valve area, by VTI measures 1.29 cm, Aortic valve mean gradient  measures 28.0 mmHg, Aortic valve Vmax measures 3.83 m/s, DVI is 0.41 but  visually the AV does not appear  stenotic. 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 Cannot get accurate waveforms to demonstrate LVOT  gradient compared to AV gradient. The AV   has been reported as moderate AS in the past but in the M Mode and  visually on 2D images the leaflets appear to open well with some AV  sclerosis. Suspect that increased AV gradient and peak velocities are due  in part to increased flow across AV from AI  as well as increased velocity in LVOT from severe BSH.   5. Aortic dilatation noted. Aneurysm of the ascending aorta, measuring 45  mm.   6. The inferior vena cava is normal in size with greater than 50%  respiratory variability, suggesting right  atrial pressure of 3 mmHg.   7. Recommend TEE to get planimetry of the AV for accurate measurement of  the AVA as well as assess severity of AI further given LV dilatation.   Echo 08/15/22: IMPRESSIONS     1. Severe AI is present. Gradients across AoV elevated (Vmax 3.7 m/s, MG  31 mmHG) but valve opens well. Suspect gradients are related to  significant AI. Would recommend TEE for clarification. LVIDd is largely  unchanged (49 mm vs 47 mm). The aortic valve   is tricuspid. Aortic valve regurgitation is severe. Mild to moderate  aortic valve stenosis. Aortic valve mean gradient measures 31.0 mmHg.  Aortic valve Vmax measures 3.75 m/s.   2. Left ventricular ejection fraction, by estimation, is 55 to 60%. The  left ventricle has normal function. The left ventricle has no regional  wall motion abnormalities. Left ventricular diastolic parameters are  consistent with Grade I diastolic  dysfunction (impaired relaxation).   3. Right ventricular systolic function is normal. The right ventricular  size is normal. There is normal pulmonary artery systolic pressure. The  estimated right ventricular systolic pressure is 19.2 mmHg.  4. The mitral valve is grossly normal. Mild mitral valve regurgitation.  No evidence of mitral stenosis.   5. There is severe dilatation of the ascending aorta, measuring 43 mm.   6. The inferior vena cava is normal in size with greater than 50%  respiratory variability, suggesting right atrial pressure of 3 mmHg.   Comparison(s): No significant change from prior study.   CT ANGIOGRAPHY CHEST WITH CONTRAST   TECHNIQUE: Multidetector CT imaging of the chest was performed using the standard protocol during bolus administration of intravenous contrast. Multiplanar CT image reconstructions and MIPs were obtained to evaluate the vascular anatomy.   RADIATION DOSE REDUCTION: This exam was performed according to the departmental dose-optimization program which  includes automated exposure control, adjustment of the mA and/or kV according to patient size and/or use of iterative reconstruction technique.   CONTRAST:  75mL ISOVUE-370 IOPAMIDOL (ISOVUE-370) INJECTION 76%   COMPARISON:  March 12, 2022, September 27, 2022 trauma on March 30, 2021   FINDINGS: Cardiovascular: Cardiomegaly. Predominantly LEFT-sided coronary artery atherosclerotic calcifications. No significant pericardial effusion. Moderate atherosclerotic calcifications of the aorta. ReVisualization of an aneurysm of the ascending thoracic aorta. Ascending thoracic aorta measures approximately 4.5 by 4.6 cm although the evaluation is limited by motion. This is similar in comparison to prior. Sino-tubular junction measures approximately 3.6 cm. The level at the sinuses of Valsalva measure approximately 4 by 4.1 cm.   Mediastinum/Nodes: Visualized thyroid is unremarkable. No axillary adenopathy or mediastinal adenopathy.   Lungs/Pleura: No pleural effusion or pneumothorax. Scattered atelectasis. LEFT lower lobe pulmonary nodule measures 4 mm, stable dating back to July 2022 (series 9, image 277).   Upper Abdomen: Partial visualization of a dilated pancreatic duct and cyst of the LEFT liver. This is better assessed on recent MRI.   Musculoskeletal: Healing LEFT anterolateral rib fractures.   Review of the MIP images confirms the above findings.   IMPRESSION: 1. Similar aneurysmal dilatation of the ascending thoracic aorta measuring up to 4.6 cm. Recommend semi-annual imaging followup by CTA or MRA and referral to cardiothoracic surgery if not already obtained. This recommendation follows 2010 ACCF/AHA/AATS/ACR/ASA/SCA/SCAI/SIR/STS/SVM Guidelines for the Diagnosis and Management of Patients With Thoracic Aortic Disease. Circulation. 2010; 121: Z610-R604. Aortic aneurysm NOS (ICD10-I71.9) 2. Healing LEFT anterolateral rib fractures. 3. Partial visualization of a dilated pancreatic  duct and cyst of the LEFT liver. This is better assessed on recent MRI. 4. Stable 4 mm LEFT lower lobe pulmonary nodule dating back to July 2022. No follow-up needed if patient is low-risk.This recommendation follows the consensus statement: Guidelines for Management of Incidental Pulmonary Nodules Detected on CT Images: From the Fleischner Society 2017; Radiology 2017; 284:228-243.   Aortic Atherosclerosis (ICD10-I70.0).     Electronically Signed  ASSESSMENT:    1. Nonrheumatic aortic valve insufficiency   2. Aneurysm of ascending aorta without rupture (HCC)   3. Hyperlipidemia LDL goal <70   4. Essential hypertension   5. Syncope and collapse       PLAN:  In order of problems listed above:  1. Hypertension: BP with excellent control on valsartan, amlodipine and Coreg.  Continue sodium restriction.  2. Non-obstructive CAD: Cath in 2017 showed mild nonobstructive CAD (pLAD 20%, p-m RCA 10%). CT chest showed mild atherosclerosis, coronary artery calcification.   Continue Crestor.  3. Moderate-severe AI/aortic stenosis: Most recent recent echocardiogram in Dec showed EF 60 to 65%, with mild LV dilation, severe LVH.  Severe AI. She is asymptomatic. Continue to focus on optimizing BP.  Indication for  surgery include development of symptoms of CHF or evidence of LV enlargement/dysfunction. Will repeat Echo in 6 months.   4. Ascending aortic aneurysm: CT chest in January 2023 showed known ascending aortic aneurysm measuring 4.7 cm. Repeat on July 3 measured 4.5 cm. No change by CT in Feb. Follows with Dr Erika Wu. Continue optimal BP control. Recommend avoidance of quinolone antibiotics.   5. PSVT/syncope: Outpatient monitor in 06/2021 in the setting of intermittent tachypalpitations showed PSVT, longest episode lasting 2 minutes 34 seconds.  no sustained arrhythmia. Continue Coreg. Her more recent episodes c/w vasovagal episodes in setting of acute injury/bleeding.    6.  Hyperlipidemia: LDL was 58.  Continue Crestor.     Medication Adjustments/Labs and Tests Ordered: Current medicines are reviewed at length with the patient today.  Concerns regarding medicines are outlined above.  Medication changes, Labs and Tests ordered today are listed in the Patient Instructions below. Patient Instructions  Medication Instructions:  No Changes *If you need a refill on your cardiac medications before your next appointment, please call your pharmacy*   Lab Work: No Labs If you have labs (blood work) drawn today and your tests are completely normal, you will receive your results only by: MyChart Message (if you have MyChart) OR A paper copy in the mail If you have any lab test that is abnormal or we need to change your treatment, we will call you to review the results.   Testing/Procedures: [address]. Your physician has requested that you have an echocardiogram. Echocardiography is a painless test that uses sound waves to create images of your heart. It provides your doctor with information about the size and shape of your heart and how well your heart's chambers and valves are working. This procedure takes approximately one hour. There are no restrictions for this procedure. Please do NOT wear cologne, perfume, aftershave, or lotions (deodorant is allowed). Please arrive 15 minutes prior to your appointment time.    Follow-Up: At [facility], you and your health needs are our priority.  As part of our continuing mission to provide you with exceptional heart care, we have created designated Provider Care Teams.  These Care Teams include your primary Cardiologist (physician) and Advanced Practice Providers (APPs -  Physician Assistants and Nurse Practitioners) who all work together to provide you with the care you need, when you need it.  We recommend signing up for the patient portal called "MyChart".  Sign up information is provided  on this After Visit Summary.  MyChart is used to connect with patients for Virtual Visits (Telemedicine).  Patients are able to view lab/test results, encounter notes, upcoming appointments, etc.  Non-urgent messages can be sent to your provider as well.   To learn more about what you can do with MyChart, go to [email or web address].    Your next appointment:   6 month(s)  Provider:   Latorria Zeoli [place], MD     Signed, Jacksyn Beeks [place], MD  01/10/2023 3:19 PM    [facility] [address], [place], [place], [address] [phone]

## 2023-01-14 NOTE — Telephone Encounter (Signed)
Patient had appointment with Dr.Jordan 01/10/23.

## 2023-02-14 ENCOUNTER — Telehealth: Payer: Self-pay

## 2023-02-14 NOTE — Telephone Encounter (Signed)
Contacted pt & LVM to return call regarding follow up appointment

## 2023-02-16 ENCOUNTER — Other Ambulatory Visit: Payer: Self-pay | Admitting: Cardiology

## 2023-03-04 ENCOUNTER — Other Ambulatory Visit: Payer: Self-pay | Admitting: Internal Medicine

## 2023-03-04 DIAGNOSIS — Z1231 Encounter for screening mammogram for malignant neoplasm of breast: Secondary | ICD-10-CM

## 2023-03-05 ENCOUNTER — Ambulatory Visit
Admission: RE | Admit: 2023-03-05 | Discharge: 2023-03-05 | Disposition: A | Discharge status: Home / Self Care | Payer: Medicare PPO | Source: Ambulatory Visit | Attending: Internal Medicine | Admitting: Internal Medicine

## 2023-03-05 DIAGNOSIS — Z1231 Encounter for screening mammogram for malignant neoplasm of breast: Secondary | ICD-10-CM

## 2023-03-08 ENCOUNTER — Other Ambulatory Visit: Payer: Self-pay

## 2023-03-08 ENCOUNTER — Other Ambulatory Visit: Payer: Self-pay | Admitting: Gastroenterology

## 2023-03-08 MED ORDER — VALSARTAN 80 MG PO TABS: 80.0000 mg | ORAL_TABLET | Freq: Two times a day (BID) | ORAL | 3 refills | Status: DC

## 2023-03-19 ENCOUNTER — Other Ambulatory Visit: Payer: Self-pay | Admitting: Thoracic Surgery (Cardiothoracic Vascular Surgery)

## 2023-03-19 DIAGNOSIS — I7121 Aneurysm of the ascending aorta, without rupture: Secondary | ICD-10-CM

## 2023-03-19 NOTE — Progress Notes (Signed)
cta

## 2023-04-12 ENCOUNTER — Other Ambulatory Visit: Payer: Self-pay

## 2023-04-12 DIAGNOSIS — R197 Diarrhea, unspecified: Secondary | ICD-10-CM

## 2023-04-12 DIAGNOSIS — K869 Disease of pancreas, unspecified: Secondary | ICD-10-CM

## 2023-04-12 DIAGNOSIS — R110 Nausea: Secondary | ICD-10-CM

## 2023-04-12 DIAGNOSIS — R140 Abdominal distension (gaseous): Secondary | ICD-10-CM

## 2023-04-19 ENCOUNTER — Ambulatory Visit (HOSPITAL_COMMUNITY): Admission: RE | Admit: 2023-04-19 | Payer: Medicare PPO | Source: Ambulatory Visit

## 2023-04-19 ENCOUNTER — Other Ambulatory Visit: Payer: Self-pay | Admitting: Physician Assistant

## 2023-04-19 DIAGNOSIS — K869 Disease of pancreas, unspecified: Secondary | ICD-10-CM | POA: Diagnosis present

## 2023-04-19 DIAGNOSIS — R197 Diarrhea, unspecified: Secondary | ICD-10-CM

## 2023-04-19 DIAGNOSIS — R110 Nausea: Secondary | ICD-10-CM

## 2023-04-19 DIAGNOSIS — R140 Abdominal distension (gaseous): Secondary | ICD-10-CM | POA: Diagnosis present

## 2023-04-19 MED ORDER — GADOBUTROL 1 MMOL/ML IV SOLN
5.5000 mL | Freq: Once | INTRAVENOUS | Status: AC | PRN
  Administered 2023-04-19: 5.5 mL via INTRAVENOUS

## 2023-04-26 ENCOUNTER — Telehealth: Payer: Self-pay | Admitting: Physician Assistant

## 2023-04-26 NOTE — Telephone Encounter (Signed)
Patient called for results, returned call and informed the patient results have not been read as of yet.

## 2023-04-26 NOTE — Telephone Encounter (Signed)
Patient calling in regards to results. Please advise.   Thank you

## 2023-04-30 ENCOUNTER — Ambulatory Visit
Admission: RE | Admit: 2023-04-30 | Discharge: 2023-04-30 | Disposition: A | Discharge status: Home / Self Care | Payer: Medicare PPO | Source: Ambulatory Visit | Attending: Thoracic Surgery (Cardiothoracic Vascular Surgery) | Admitting: Thoracic Surgery (Cardiothoracic Vascular Surgery)

## 2023-04-30 ENCOUNTER — Ambulatory Visit: Payer: Medicare PPO | Admitting: Thoracic Surgery (Cardiothoracic Vascular Surgery)

## 2023-04-30 DIAGNOSIS — I7121 Aneurysm of the ascending aorta, without rupture: Secondary | ICD-10-CM

## 2023-04-30 MED ORDER — IOPAMIDOL (ISOVUE-370) INJECTION 76%
200.0000 mL | Freq: Once | INTRAVENOUS | Status: AC | PRN
  Administered 2023-04-30: 75 mL via INTRAVENOUS

## 2023-05-06 ENCOUNTER — Ambulatory Visit: Payer: Medicare PPO | Admitting: Thoracic Surgery (Cardiothoracic Vascular Surgery)

## 2023-05-28 ENCOUNTER — Ambulatory Visit: Payer: Medicare PPO | Admitting: Thoracic Surgery (Cardiothoracic Vascular Surgery)

## 2023-05-28 VITALS — BP 138/69 | HR 73 | Resp 18 | Ht 63.0 in | Wt 122.0 lb

## 2023-05-28 DIAGNOSIS — I7121 Aneurysm of the ascending aorta, without rupture: Secondary | ICD-10-CM | POA: Insufficient documentation

## 2023-05-28 NOTE — Progress Notes (Signed)
[address]       Erika Wu [address]             [phone]     HPI: Erika Wu returns for a scheduled follow-up visit  Erika Wu is a 77 year old woman with a history of heart murmur, aortic stenosis, aortic insufficiency, ascending aortic aneurysm, hypertension, hyperlipidemia, reflux, anxiety, and depression.  I last saw her in February 2024.  Her aneurysm was stable at 4.5 cm.  She is followed by Dr. Peter [place].  He does echocardiograms about every 6 months.  Her most recent echo showed mild to moderate AAS with severe AI, preserved left-ventricular systolic function and only mild LV size increase.  She feels well.  She is not having any chest pain, pressure, tightness, or shortness of breath.  No dizziness or syncope.  No peripheral edema.  No orthopnea or paroxysmal nocturnal dyspnea.  Past Medical History:  Diagnosis Date   Allergy    Anxiety    Arthritis    Chest pain    a. 03/2015 Lexiscan MV: EF 59%, no ischemia/infarct.   Colon polyps    COVID-19 virus infection    09-22-2020, fully vax'd    Depression    GERD (gastroesophageal reflux disease)    Heart murmur    Hemorrhoids    Hyperlipidemia    Hypertension    Moderate aortic insufficiency    a. 03/2015 Echo: EF 55-60%, no rwma, mild LVH, Gr 1 DD, mild AS, mod AI (bicuspid AoV by report, poorly visualized on this study), Asc Ao diam 44mm, triv MR.    Current Outpatient Medications  Medication Sig Dispense Refill   amLODipine (NORVASC) 5 MG tablet Take 1 tablet (5 mg total) by mouth daily. 90 tablet 3   buPROPion (WELLBUTRIN XL) 150 MG 24 hr tablet Take 450 mg by mouth every morning.  3   carvedilol (COREG) 25 MG tablet TAKE 1 TABLET(25 MG) BY MOUTH TWICE DAILY 180 tablet 3   mirtazapine (REMERON) 30 MG tablet Take 30 mg by mouth at bedtime.     pantoprazole (PROTONIX) 40 MG tablet TAKE 1 TABLET(40 MG) BY MOUTH TWICE DAILY BEFORE A MEAL 180 tablet 3   rosuvastatin (CRESTOR) 10 MG  tablet Take 1 tablet by mouth daily.     Suvorexant (BELSOMRA) 20 MG TABS Take 20 mg by mouth daily.     valsartan (DIOVAN) 80 MG tablet Take 1 tablet (80 mg total) by mouth 2 (two) times daily. 180 tablet 3   hydrALAZINE (APRESOLINE) 10 MG tablet Take 1 tablet (10 mg total) by mouth as needed (as needed for systolic Blood pressures greater than 170). 30 tablet 1   No current facility-administered medications for this visit.    Physical Exam BP 138/69 (BP Location: Left Arm, Patient Position: Sitting)   Pulse 73   Resp 18   Ht 5\' 3"  (1.6 m)   Wt 122 lb (55.3 kg)   SpO2 92% Comment: RA  BMI 21.75 kg/m  78 year old woman in no acute distress Well-developed and well-nourished Alert and oriented x 3 with no focal deficits Cardiac regular rate and rhythm with 3/6 systolic and diastolic murmurs Lungs clear without rales No peripheral edema  Diagnostic Tests: CT ANGIOGRAPHY CHEST WITH CONTRAST   TECHNIQUE: Multidetector CT imaging of the chest was performed using the standard protocol during bolus administration of intravenous contrast. Multiplanar CT image reconstructions and MIPs were obtained to evaluate the vascular anatomy.   RADIATION DOSE  REDUCTION: This exam was performed according to the departmental dose-optimization program which includes automated exposure control, adjustment of the mA and/or kV according to patient size and/or use of iterative reconstruction technique.   CONTRAST:  75mL ISOVUE-370 IOPAMIDOL (ISOVUE-370) INJECTION 76%   COMPARISON:  February 2024   FINDINGS: Cardiovascular: Preferential opacification of the thoracic aorta. Ascending thoracic aorta measures 4.5 cm, similar in size previously 4.6 cm. Normal heart size. The central pulmonary arteries are normal in caliber without central filling defect. No pericardial effusion.   Mediastinum/Nodes: No enlarged mediastinal, hilar, or axillary lymph nodes. The thyroid gland appears normal.    Lungs/Pleura: No pleural effusion. No pneumothorax. No mass or focal consolidation. Similar areas of scarring/chronic atelectasis in the lower lungs. Similar appearance of 4 mm solid pulmonary nodule in the left lower lobe, previously characterized as stable dating back to July 2022, and requiring no further imaging follow-up.   Musculoskeletal: No aggressive osseous lesions.   Upper abdomen: The visualized upper abdomen is unremarkable. 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   Review of the MIP images confirms the above findings.   IMPRESSION: Similar appearance of ascending thoracic aortic aneurysm measuring up to 4.5 cm. Ascending thoracic aortic aneurysm. Recommend semi-annual imaging followup by CTA or MRA and referral to cardiothoracic surgery if not already obtained. This recommendation follows 2010 ACCF/AHA/AATS/ACR/ASA/SCA/SCAI/SIR/STS/SVM Guidelines for the Diagnosis and Management of Patients With Thoracic Aortic Disease. Circulation. 2010; 121: V425-Z563. Aortic aneurysm NOS (ICD10-I71.9)     Electronically Signed   By: Olive Bass M.D.   On: 04/30/2023 10:53   I personally reviewed the CT images.  Stable 4.5 cm ascending aneurysm.  Stable 4 mm lung nodule.  Impression: Erika Wu is a 77 year old woman with a history of heart murmur, aortic stenosis, aortic insufficiency, ascending aortic aneurysm, hypertension, hyperlipidemia, reflux, anxiety, and depression.  Ascending aneurysm-stable at 4.5 cm.  No indication for surgery at present time.  I suspect her valve will be the reason for her to need surgery before the aneurysm is.  Obviously we would fix both at the same time.  Aortic valve insufficiency and stenosis-severe AI without any significant left ventricular dilatation at this point.  Being followed by Dr. Peter [place].  Due for another echocardiogram in the fall.  Plan: Return in 6 months with CT chest  I spent over 20 minutes  in review of records, images, and in consultation with Erika Wu today.  [name], MD [facility] [phone]

## 2023-06-05 ENCOUNTER — Other Ambulatory Visit: Payer: Self-pay | Admitting: Physician Assistant

## 2023-06-06 DIAGNOSIS — L738 Other specified follicular disorders: Secondary | ICD-10-CM | POA: Diagnosis not present

## 2023-06-06 DIAGNOSIS — L570 Actinic keratosis: Secondary | ICD-10-CM | POA: Diagnosis not present

## 2023-06-06 DIAGNOSIS — D1801 Hemangioma of skin and subcutaneous tissue: Secondary | ICD-10-CM | POA: Diagnosis not present

## 2023-06-06 DIAGNOSIS — L821 Other seborrheic keratosis: Secondary | ICD-10-CM | POA: Diagnosis not present

## 2023-06-06 DIAGNOSIS — L720 Epidermal cyst: Secondary | ICD-10-CM | POA: Diagnosis not present

## 2023-06-26 ENCOUNTER — Ambulatory Visit (HOSPITAL_COMMUNITY): Payer: Medicare PPO | Attending: Cardiology

## 2023-06-26 DIAGNOSIS — E785 Hyperlipidemia, unspecified: Secondary | ICD-10-CM | POA: Diagnosis not present

## 2023-06-26 DIAGNOSIS — I083 Combined rheumatic disorders of mitral, aortic and tricuspid valves: Secondary | ICD-10-CM | POA: Diagnosis not present

## 2023-06-26 DIAGNOSIS — I351 Nonrheumatic aortic (valve) insufficiency: Secondary | ICD-10-CM | POA: Diagnosis not present

## 2023-06-26 DIAGNOSIS — R55 Syncope and collapse: Secondary | ICD-10-CM | POA: Insufficient documentation

## 2023-06-26 DIAGNOSIS — I77810 Thoracic aortic ectasia: Secondary | ICD-10-CM

## 2023-06-26 DIAGNOSIS — I5030 Unspecified diastolic (congestive) heart failure: Secondary | ICD-10-CM | POA: Diagnosis not present

## 2023-06-26 DIAGNOSIS — I10 Essential (primary) hypertension: Secondary | ICD-10-CM | POA: Diagnosis not present

## 2023-06-26 DIAGNOSIS — I7121 Aneurysm of the ascending aorta, without rupture: Secondary | ICD-10-CM | POA: Diagnosis not present

## 2023-06-26 DIAGNOSIS — I517 Cardiomegaly: Secondary | ICD-10-CM

## 2023-06-29 LAB — ECHOCARDIOGRAM COMPLETE
AR max vel: 1.29 cm2
AV Area VTI: 1.39 cm2
AV Area mean vel: 1.37 cm2
AV Mean grad: 30 mm[Hg]
AV Peak grad: 59.3 mm[Hg]
Ao pk vel: 3.85 m/s
Area-P 1/2: 2.8 cm2
Calc EF: 61.4 %
MV M vel: 3.82 m/s
MV Peak grad: 58.2 mm[Hg]
P 1/2 time: 458 ms
S' Lateral: 4.3 cm
Single Plane A2C EF: 60.5 %
Single Plane A4C EF: 64.9 %

## 2023-07-06 DIAGNOSIS — R9431 Abnormal electrocardiogram [ECG] [EKG]: Secondary | ICD-10-CM | POA: Diagnosis not present

## 2023-07-06 DIAGNOSIS — R0789 Other chest pain: Secondary | ICD-10-CM | POA: Diagnosis not present

## 2023-07-06 DIAGNOSIS — K219 Gastro-esophageal reflux disease without esophagitis: Secondary | ICD-10-CM | POA: Diagnosis not present

## 2023-07-06 DIAGNOSIS — R197 Diarrhea, unspecified: Secondary | ICD-10-CM | POA: Diagnosis not present

## 2023-07-06 DIAGNOSIS — K529 Noninfective gastroenteritis and colitis, unspecified: Secondary | ICD-10-CM | POA: Diagnosis not present

## 2023-07-06 DIAGNOSIS — R5383 Other fatigue: Secondary | ICD-10-CM | POA: Diagnosis not present

## 2023-07-06 DIAGNOSIS — R143 Flatulence: Secondary | ICD-10-CM | POA: Diagnosis not present

## 2023-07-09 ENCOUNTER — Ambulatory Visit: Payer: Medicare PPO | Admitting: Gastroenterology

## 2023-07-09 ENCOUNTER — Encounter: Payer: Self-pay | Admitting: Gastroenterology

## 2023-07-09 VITALS — BP 140/60 | HR 64 | Ht 63.0 in | Wt 121.0 lb

## 2023-07-09 DIAGNOSIS — K862 Cyst of pancreas: Secondary | ICD-10-CM | POA: Diagnosis not present

## 2023-07-09 DIAGNOSIS — I2510 Atherosclerotic heart disease of native coronary artery without angina pectoris: Secondary | ICD-10-CM | POA: Diagnosis not present

## 2023-07-09 DIAGNOSIS — R140 Abdominal distension (gaseous): Secondary | ICD-10-CM | POA: Insufficient documentation

## 2023-07-09 DIAGNOSIS — R141 Gas pain: Secondary | ICD-10-CM | POA: Diagnosis not present

## 2023-07-09 DIAGNOSIS — K580 Irritable bowel syndrome with diarrhea: Secondary | ICD-10-CM | POA: Diagnosis not present

## 2023-07-09 DIAGNOSIS — K219 Gastro-esophageal reflux disease without esophagitis: Secondary | ICD-10-CM | POA: Diagnosis not present

## 2023-07-09 DIAGNOSIS — B349 Viral infection, unspecified: Secondary | ICD-10-CM | POA: Diagnosis not present

## 2023-07-09 DIAGNOSIS — I712 Thoracic aortic aneurysm, without rupture, unspecified: Secondary | ICD-10-CM | POA: Diagnosis not present

## 2023-07-09 DIAGNOSIS — R197 Diarrhea, unspecified: Secondary | ICD-10-CM | POA: Diagnosis not present

## 2023-07-09 DIAGNOSIS — I10 Essential (primary) hypertension: Secondary | ICD-10-CM | POA: Diagnosis not present

## 2023-07-09 DIAGNOSIS — K638219 Small intestinal bacterial overgrowth, unspecified: Secondary | ICD-10-CM | POA: Diagnosis not present

## 2023-07-09 DIAGNOSIS — K8689 Other specified diseases of pancreas: Secondary | ICD-10-CM | POA: Diagnosis not present

## 2023-07-09 DIAGNOSIS — M810 Age-related osteoporosis without current pathological fracture: Secondary | ICD-10-CM | POA: Diagnosis not present

## 2023-07-09 MED ORDER — RIFAXIMIN 550 MG PO TABS: 550.0000 mg | ORAL_TABLET | Freq: Two times a day (BID) | ORAL | 0 refills | Status: DC

## 2023-07-09 NOTE — Progress Notes (Unsigned)
GASTROENTEROLOGY OUTPATIENT CLINIC VISIT   Primary Care Provider Melida Quitter, MD [address] [place] [address] [phone]  Patient Profile: Erika Wu is a 77 y.o. 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  The patient presents to the [facility] for an evaluation and management of problem(s) noted below:  Problem List 1. Irritable bowel syndrome with diarrhea   2. Small intestinal bacterial overgrowth (SIBO)   3. Pancreatic cyst   4. Bloating   5. Abdominal gas pain   6. Dilation of pancreatic duct    Discussed the use of AI scribe software for clinical note transcription with the patient, who gave verbal consent to proceed.  History of Present Illness Please see prior GI notes for full details of HPI.  Interval History The patient returns for follow-up.  This is a patient who previously followed with Dr. Orvan Falconer and has been seen on multiple occasions over the last few years.  She has a known history of a pancreatic cyst that has been monitored/surveilled.  She also continues to express issues of chronic diarrhea, excessive gas, increased bloating and frequent belching.  These symptoms have been ongoing for over two years and even though she keeps coming to the MD, she feels that there must still be something wrong as these symptoms are becoming increasingly disruptive to her daily life.  She uses the bathroom to have a bowel movement between 3-5 times per day.  At times she also reports occasional hemorrhoidal bleeding on her toilet paper from wiping so frequently.  She has not noted any mucous.  She does not have nocturnal symptoms that wake her up to have a bowel movement.  In the past, the patient has undergone SIBO breath testing, which returned  positive for bacterial overgrowth. The patient was prescribed per documentation in the chart Xifaxin with partial response and then Neomycin with partial response and then for unclear reasons did not get another round of antibiotics or repeat breath testing.  She thinks that it is possible she didn't complete all her antibiotic dosing however, after questioning further, due to issues of COVID infections that have occurred over the last few years and the brain fog that has occurred.  She has been told in the past that she may have IBS-Diarrhea.  At some point, the patient was prescribed Creon to be taken with meals, but again, unclear that she is taking this regularly and her Fecal elastase was previously normal.  She wants to feel better and understands that additional workup may require a Colonoscopy to rule out other disorders and she is open to this.   GI Review of Systems Positive as above Negative for  Pyrosis; Reflux; Regurgitation; Dysphagia; Odynophagia; Globus; Post-prandial cough; Nocturnal cough; Nasal regurgitation; Epigastric pain; Nausea; Vomiting; Hematemesis; Jaundice; Change in Appetite; Early satiety; Abdominal pain; Abdominal bloating; Eructation; Flatulence; Change in BM Frequency; Change in BM Consistency; Constipation; Diarrhea; Incontinence; Urgency; Tenesmus; Hematochezia; Melena  Review of Systems General: Denies fevers/chills/weight loss/night sweats HEENT: Denies oral lesions/sore throat/headaches/visual changes Cardiovascular: Denies chest pain/palpitations Pulmonary: Denies shortness of breath/cough Gastroenterological: See HPI Genitourinary: Denies darkened urine or hematuria Hematological: Denies easy bruising/bleeding Endocrine: Denies temperature intolerance Dermatological: Denies skin changes Psychological: Mood is stable Allergy & Immunology: Denies severe allergic reactions Musculoskeletal: Denies new arthralgias   Medications Current Outpatient Medications   Medication  Sig Dispense Refill   amLODipine (NORVASC) 5 MG tablet Take 1 tablet (5 mg total) by mouth daily. 90 tablet 3   buPROPion (WELLBUTRIN XL) 150 MG 24 hr tablet Take 450 mg by mouth every morning.  3   carvedilol (COREG) 25 MG tablet TAKE 1 TABLET(25 MG) BY MOUTH TWICE DAILY 180 tablet 3   famotidine (PEPCID) 40 MG tablet TAKE 1 TABLET(40 MG) BY MOUTH AT BEDTIME 90 tablet 1   mirtazapine (REMERON) 30 MG tablet Take 30 mg by mouth at bedtime.     pantoprazole (PROTONIX) 40 MG tablet TAKE 1 TABLET(40 MG) BY MOUTH TWICE DAILY BEFORE A MEAL 180 tablet 3   rifaximin (XIFAXAN) 550 MG TABS tablet Take 1 tablet (550 mg total) by mouth 2 (two) times daily. 28 tablet 0   rosuvastatin (CRESTOR) 10 MG tablet Take 1 tablet by mouth daily.     Suvorexant (BELSOMRA) 20 MG TABS Take 20 mg by mouth daily.     valsartan (DIOVAN) 80 MG tablet Take 1 tablet (80 mg total) by mouth 2 (two) times daily. 180 tablet 3   hydrALAZINE (APRESOLINE) 10 MG tablet Take 1 tablet (10 mg total) by mouth as needed (as needed for systolic Blood pressures greater than 170). 30 tablet 1   No current facility-administered medications for this visit.    Allergies Allergies  Allergen Reactions   Latex Itching   Tramadol Nausea And Vomiting    And made her feel bad    Histories Past Medical History:  Diagnosis Date   Allergy    Anxiety    Arthritis    Chest pain    a. 03/2015 Lexiscan MV: EF 59%, no ischemia/infarct.   Colon polyps    COVID-19 virus infection    09-22-2020, fully vax'd    Depression    GERD (gastroesophageal reflux disease)    Heart murmur    Hemorrhoids    Hyperlipidemia    Hypertension    Moderate aortic insufficiency    a. 03/2015 Echo: EF 55-60%, no rwma, mild LVH, Gr 1 DD, mild AS, mod AI (bicuspid AoV by report, poorly visualized on this study), Asc Ao diam 44mm, triv MR.   Past Surgical History:  Procedure Laterality Date   CARDIAC CATHETERIZATION N/A 07/24/2016   Procedure: Left  Heart Cath and Coronary Angiography;  Surgeon: Peter M Swaziland, MD;  Location: [facility] INVASIVE CV LAB;  Service: Cardiovascular;  Laterality: N/A;   COLONOSCOPY     HAND DEBRIDEMENT  07/2022   right hand   NERVE, TENDON AND ARTERY REPAIR Right 08/07/2022   Procedure: NERVE, TENDON AND ARTERY REPAIR;  Surgeon: Betha Loa, MD;  Location: [facility] OR;  Service: Orthopedics;  Laterality: Right;   POLYPECTOMY     TONSILLECTOMY AND ADENOIDECTOMY     TUBAL LIGATION     WOUND EXPLORATION Right 08/07/2022   Procedure: WOUND EXPLORATION RIGHT WRIST;  Surgeon: Betha Loa, MD;  Location: [facility] OR;  Service: Orthopedics;  Laterality: Right;  60 MIN   Social History   Socioeconomic History   Marital status: Legally Separated    Spouse name: Not on file   Number of children: 2   Years of education: Not on file   Highest education level: Not on file  Occupational History   Occupation: retired Runner, broadcasting/film/video    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  Tobacco Use   Smoking status: Former    Current packs/day: 0.50    Average packs/day: 0.5 packs/day for 15.0 years (7.5  ttl pk-yrs)    Types: Cigarettes   Smokeless tobacco: Never  Vaping Use   Vaping status: Never Used  Substance and Sexual Activity   Alcohol use: Yes    Alcohol/week: 0.0 standard drinks of alcohol    Comment: 2-3 per day   Drug use: No   Sexual activity: Not on file  Other Topics Concern   Not on file  Social History Narrative   Not on file   Social Determinants of Health   Financial Resource Strain: Not on file  Food Insecurity: Not on file  Transportation Needs: Not on file  Physical Activity: Not on file  Stress: Not on file  Social Connections: Unknown (07/06/2023)   Received from [facility]   Social Network    Social Network: Not on file  Intimate Partner Violence: Unknown (07/06/2023)   Received from [facility]   HITS    Physically Hurt: Not on file    Insult or Talk Down To: Not on file    Threaten  Physical Harm: Not on file    Scream or Curse: Not on file   Family History  Problem Relation Age of Onset   Heart attack Father        Died @ 78.  Had CABG in his 35's.   Heart disease Father    HIV Brother    Stomach cancer Maternal Grandmother    Colon cancer Maternal Grandmother    Diverticulosis Maternal Grandfather    Stroke Paternal Grandfather    Hypertension Neg Hx    Esophageal cancer Neg Hx    Rectal cancer Neg Hx    Breast cancer Neg Hx    Inflammatory bowel disease Neg Hx    Liver disease Neg Hx    Pancreatic cancer Neg Hx    I have reviewed her medical, social, and family history in detail and updated the electronic medical record as necessary.    PHYSICAL EXAMINATION  BP (!) 140/60   Pulse 64   Ht 5\' 3"  (1.6 m)   Wt 121 lb (54.9 kg)   BMI 21.43 kg/m  Wt Readings from Last 3 Encounters:  07/09/23 121 lb (54.9 kg)  05/28/23 122 lb (55.3 kg)  01/10/23 124 lb (56.2 kg)   GEN: NAD, appears stated age, doesn't appear chronically ill PSYCH: Cooperative, without pressured speech EYE: Conjunctivae pink, sclerae anicteric ENT: MMM NECK: Supple CV: Nontachycardic RESP: No audible wheezing GI: NABS, soft, NT/ND, without rebound or guarding, no HSM appreciated GU: DRE shows MSK/EXT: _ edema, no palmar erythema SKIN: No jaundice, no spider angiomata, no concerning rashes NEURO:  Alert & Oriented x 3, no focal deficits, no evidence of asterixis   REVIEW OF DATA  I reviewed the following data at the time of this encounter:  GI Procedures and Studies  May 2022 EGD - Z-line irregular, 36 cm from the incisors. Biopsied. - Increased vascular pattern mucosa in the gastric body. Biopsied. - Multiple gastric polyps. Resected and retrieved. - Normal examined duodenum. Biopsied.  Pathology Diagnosis 1. Surgical [P], duodenal biopsies - PEPTIC DUODENITIS. - NO DYSPLASIA OR MALIGNANCY. 2. Surgical [P], gastric antrum - REACTIVE GASTROPATHY. Ninetta Lights IS  NEGATIVE FOR HELICOBACTER PYLORI. - NO INTESTINAL METAPLASIA, DYSPLASIA, OR MALIGNANCY. 3. Surgical [P], gastric body - BENIGN GASTRIC MUCOSA. Ninetta Lights IS NEGATIVE FOR HELICOBACTER PYLORI. - NO INTESTINAL METAPLASIA, DYSPLASIA, OR MALIGNANCY. 4. Surgical [P], fundus - BENIGN GASTRIC MUCOSA. Ninetta Lights IS NEGATIVE FOR HELICOBACTER PYLORI. - NO INTESTINAL METAPLASIA, DYSPLASIA, OR MALIGNANCY.  5. Surgical [P], gastric polyps biopsies - FUNDIC GLAND POLYP(S). - NO INTESTINAL METAPLASIA, DYSPLASIA, OR MALIGNANCY. 6. Surgical [P], distal esophagus - REFLUX CHANGES. - NO INTESTINAL METAPLASIA, DYSPLASIA, OR MALIGNANCY.  February 2022 colonoscopy - Hemorrhoids found on perianal exam. - Three 1 mm polyps in the rectum, in the sigmoid colon and in the ascending colon, removed with a cold biopsy forceps. Resected and retrieved. - The examination was otherwise normal on direct and retroflexion views.  Pathology Diagnosis Surgical [P], colon, rectum, sigmoid and ascending, polyp (3) - TUBULAR ADENOMA. - SESSILE SERRATED POLYP WITHOUT DYSPLASIA. - HYPERPLASTIC POLYP. - NO HIGH GRADE DYSPLASIA OR MALIGNANCY.  Laboratory Studies  Reviewed those in epic  Imaging Studies  August 2024 MRI/MRCP IMPRESSION: 1. Multilobulated cystic lesion in the superior pancreatic head measuring 1.9 x 1.4 x 0.8 cm and communicating with the main pancreatic duct. No solid component or suspicious contrast enhancement. When accounting for slice selection and measuring similarly, this is not significantly changed in size on examinations dating back to 05/05/2021. Pancreatic duct is mildly prominent and there is mild atrophy of the pancreatic head and neck, these findings similar to recent prior examination although progressed compared to prior examinations dated 2022. This is most consistent with an IPMN and almost certainly benign given size smaller than 2 cm and established imaging stability.  However, consider EUS/FNA for further assessment given progression of pancreatic atrophy and pancreatic ductal prominence as well as potentially referable signs and symptoms. 2. Cardiomegaly.   ASSESSMENT  Ms. Bissey is a 77 y.o. 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  The patient is seen today for evaluation and management of:  1. Irritable bowel syndrome with diarrhea   2. Small intestinal bacterial overgrowth (SIBO)   3. Pancreatic cyst   4. Bloating   5. Abdominal gas pain   6. Dilation of pancreatic duct    Assessment and Plan    Chronic Diarrhea They have experienced persistent symptoms of diarrhea for 2 years, accompanied by bloating and gas. A previous breath test indicated bacterial overgrowth, with only a partial response to antibiotics, suggesting possible irritable bowel syndrome with diarrhea (IBS-D) or microscopic colitis. We will order stool studies to exclude a C. difficile infection and assess for inflammation. Xifaxan 550mg  will be prescribed for 14 days for suspected IBS-D. A colonoscopy will be considered if there is no response to the antibiotics.  Pancreatic Cyst The cyst on their pancreas has been stable for 3 years, but recent imaging revealed atrophy of the pancreas and dilation of the pancreatic duct. An endoscopic ultrasound will be scheduled to evaluate the pancreas and pancreatic duct.  Hemorrhoids They report occasional bleeding. The current management will continue.  General Health Maintenance / Followup Plans They should contact the office 1 week after completing the antibiotics to assess the response. A follow-up visit will be scheduled to discuss the results of the stool studies and the response to treatment. A colonoscopy will be considered 6-7 weeks  out, with the option to cancel if symptoms improve with antibiotic treatment.        PLAN  There are no diagnoses linked to this encounter.   Orders Placed This Encounter  Procedures   Clostridium difficile Toxin B, Qualitative, Real-Time PCR   Calprotectin, Fecal   Pancreatic elastase, fecal    New Prescriptions   RIFAXIMIN Burman Blacksmith)  550 MG TABS TABLET    Take 1 tablet (550 mg total) by mouth 2 (two) times daily.   Modified Medications   No medications on file    Planned Follow Up No follow-ups on file.   Total Time in Face-to-Face and in Coordination of Care for patient including independent/personal interpretation/review of prior testing, medical history, examination, medication adjustment, communicating results with the patient directly, and documentation within the EHR is 40 minutes (30 minutes with patient and with patient's daughter on the phone and 10 minutes in clinical documentation).   Corliss Parish, MD [facility] Office # [phone]

## 2023-07-09 NOTE — Patient Instructions (Addendum)
We have sent the following medications to your pharmacy for you to pick up at your convenience: Xifaxan   Your provider has requested that you go to the basement level for lab work before leaving today. Press "B" on the elevator. The lab is located at the first door on the left as you exit the elevator.  You will be scheduled for EUS in January 2025. Depending on results of stool studies and treatment with Xifaxan , Colonoscopy will be done at the same time.  Please let us know how your feeling after completion of Xifaxan.    Due to recent changes in healthcare laws, you may see the results of your imaging and laboratory studies on MyChart before your provider has had a chance to review them.  We understand that in some cases there may be results that are confusing or concerning to you. Not all laboratory results come back in the same time frame and the provider may be waiting for multiple results in order to interpret others.  Please give [place] 48 hours in order for your provider to thoroughly review all the results before contacting the office for clarification of your results.   _______________________________________________________  If your blood pressure at your visit was 140/90 or greater, please contact your primary care physician to follow up on this.  _______________________________________________________  If you are age 76 or older, your body mass index should be between 23-30. Your Body mass index is 21.43 kg/m. If this is out of the aforementioned range listed, please consider follow up with your Primary Care Provider.  If you are age 61 or younger, your body mass index should be between 19-25. Your Body mass index is 21.43 kg/m. If this is out of the aformentioned range listed, please consider follow up with your Primary Care Provider.   ________________________________________________________  The [address] [facility] providers would like to encourage you to use [facility] to communicate with  providers for non-urgent requests or questions.  Due to long hold times on the telephone, sending your provider a message by [facility] may be a faster and more efficient way to get a response.  Please allow 48 business hours for a response.  Please remember that this is for non-urgent requests.  _______________________________________________________  Thank you for choosing me and [facility].  Dr. Meridee Score

## 2023-07-10 ENCOUNTER — Other Ambulatory Visit: Payer: Self-pay

## 2023-07-10 DIAGNOSIS — K862 Cyst of pancreas: Secondary | ICD-10-CM

## 2023-07-10 DIAGNOSIS — K8689 Other specified diseases of pancreas: Secondary | ICD-10-CM

## 2023-07-11 ENCOUNTER — Other Ambulatory Visit: Payer: Medicare PPO

## 2023-07-11 DIAGNOSIS — K8689 Other specified diseases of pancreas: Secondary | ICD-10-CM | POA: Diagnosis not present

## 2023-07-11 DIAGNOSIS — R141 Gas pain: Secondary | ICD-10-CM | POA: Diagnosis not present

## 2023-07-11 DIAGNOSIS — K638219 Small intestinal bacterial overgrowth, unspecified: Secondary | ICD-10-CM | POA: Diagnosis not present

## 2023-07-11 DIAGNOSIS — R140 Abdominal distension (gaseous): Secondary | ICD-10-CM | POA: Diagnosis not present

## 2023-07-11 DIAGNOSIS — K580 Irritable bowel syndrome with diarrhea: Secondary | ICD-10-CM | POA: Diagnosis not present

## 2023-07-15 ENCOUNTER — Other Ambulatory Visit (HOSPITAL_COMMUNITY): Payer: Self-pay | Admitting: *Deleted

## 2023-07-16 LAB — CALPROTECTIN, FECAL: Calprotectin, Fecal: 447 ug/g — ABNORMAL HIGH (ref 0–120)

## 2023-07-17 ENCOUNTER — Other Ambulatory Visit: Payer: Self-pay

## 2023-07-17 ENCOUNTER — Telehealth: Payer: Self-pay | Admitting: Gastroenterology

## 2023-07-17 ENCOUNTER — Encounter (HOSPITAL_COMMUNITY)
Admission: RE | Admit: 2023-07-17 | Discharge: 2023-07-17 | Disposition: A | Discharge status: Home / Self Care | Payer: Medicare PPO | Source: Ambulatory Visit | Attending: Internal Medicine | Admitting: Internal Medicine

## 2023-07-17 DIAGNOSIS — M810 Age-related osteoporosis without current pathological fracture: Secondary | ICD-10-CM | POA: Insufficient documentation

## 2023-07-17 DIAGNOSIS — R195 Other fecal abnormalities: Secondary | ICD-10-CM

## 2023-07-17 MED ORDER — DENOSUMAB 60 MG/ML ~~LOC~~ SOSY: 60.0000 mg | PREFILLED_SYRINGE | Freq: Once | SUBCUTANEOUS | Status: AC

## 2023-07-17 MED ORDER — DENOSUMAB 60 MG/ML ~~LOC~~ SOSY
PREFILLED_SYRINGE | SUBCUTANEOUS | Status: AC
  Administered 2023-07-17: 60 mg via SUBCUTANEOUS
  Filled 2023-07-17: qty 1

## 2023-07-17 NOTE — Telephone Encounter (Signed)
Inbound call from patient stating that she needs to discontinue Xifaxan. Requesting a call to discuss. Please advise.

## 2023-07-17 NOTE — Telephone Encounter (Addendum)
FYI- Dr. Meridee Score   Returned call to patient. She has been diagnosed with Pneumonia and will need to stop Xifaxan for now to take antibiotics to treat Pneumonia. Patient will restart Xifaxan after treatment is complete for Pneumonia.

## 2023-07-30 LAB — CLOSTRIDIUM DIFFICILE TOXIN B, QUALITATIVE, REAL-TIME PCR: Toxigenic C. Difficile by PCR: NOT DETECTED

## 2023-07-30 LAB — PANCREATIC ELASTASE, FECAL: Pancreatic Elastase-1, Stool: >800 ug/g (ref >200)

## 2023-08-01 DIAGNOSIS — F331 Major depressive disorder, recurrent, moderate: Secondary | ICD-10-CM | POA: Diagnosis not present

## 2023-08-01 NOTE — Progress Notes (Signed)
Cardiology Office Note    Date:  08/02/2023   ID:  Erika Wu, Erika Wu 06/06/1946, MRN [id number]  PCP:  Melida Quitter, MD  Cardiologist:  Tamkia Temples [place], MD    History of Present Illness:  Erika Wu is a 77 y.o. female seen for follow up  of chronic AI.   She also has a history of aortic stenosis/AI. She had a normal Myoview study in July 2016. She also had an Echo at that time showing Aortic root enlargement with moderate  AI. This was stable from prior exams. Seen in October 2017 with symptoms of chest pain and palpitations. Echo reported severe AI with dilated aorta of 4.4 cm. EF and LV dimensions were normal. Myoview study was abnormal with evidence of apical ischemia. This led to a cardiac cath in November 2017 showing minimal nonobstructive CAD. Normal LV function and normal LVEDP. She also wore an event monitor that was normal with no arrhythmia.   Echo in March 2021 showed normal EF with moderate AI.   Echocardiogram obtained on 01/23/2021 showed EF 65 to 70%, grade 1 DD, mild LVH, moderate to severe AI with moderate aortic stenosis, borderline dilated ascending aorta measuring at 39 mm.  CTA of the chest obtained on 03/30/2021 showed a  thoracic aortic aneurysm measuring at 4.5 cm.   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     HIDA scan obtained on 05/22/2021 was normal.  She was seen in September 2022 with complaints of intermittent palpitations. She wore an event monitor which showed some minor NSVT. She did have some episodes of SVT longest lasting 3 minutes. Metoprolol dose was increased. When seen in November symptoms were improved.   Follow up CT in February showed stable aortic size 4.5 cm. Last Echo in October showed severe AI, mild AS. EF preserved. LV dimension OK.   She  is seen today with her daughter. Last month she was visiting family in [place] when she felt ill with cough and no energy. Was seen in ED. CXR was clear. Ecg no change. Troponin normal. Chemistries and CBC Ok. Tested negative for flu and Covid. Since then she is better but still has a mild cough. Prior to this she has noted that when she walks she sometimes will give out more easily and have to stop. No swelling, orthopnea or PND.     Past Medical History:  Diagnosis Date   Allergy    Anxiety    Arthritis    Chest pain    a. 03/2015 Lexiscan MV: EF 59%, no ischemia/infarct.   Colon polyps    COVID-19 virus infection    09-22-2020, fully vax'd    Depression    GERD (gastroesophageal reflux disease)    Heart murmur    Hemorrhoids    Hyperlipidemia    Hypertension    Moderate aortic insufficiency    a. 03/2015 Echo: EF 55-60%, no rwma, mild LVH, Gr 1 DD, mild AS, mod AI (bicuspid AoV by report, poorly visualized on this study), Asc Ao diam 44mm, triv MR.    Past Surgical History:  Procedure Laterality Date   CARDIAC CATHETERIZATION N/A 07/24/2016   Procedure: Left Heart Cath and Coronary Angiography;  Surgeon: Rossy Virag M [place], MD;  Location: [facility] INVASIVE CV LAB;  Service: Cardiovascular;  Laterality: N/A;  COLONOSCOPY     HAND DEBRIDEMENT  07/2022   right hand   NERVE, TENDON AND ARTERY REPAIR Right 08/07/2022   Procedure: NERVE, TENDON AND ARTERY REPAIR;  Surgeon: Betha Loa, MD;  Location: [facility] OR;  Service: Orthopedics;  Laterality: Right;   POLYPECTOMY     TONSILLECTOMY AND ADENOIDECTOMY     TUBAL LIGATION     WOUND EXPLORATION Right 08/07/2022   Procedure: WOUND EXPLORATION RIGHT WRIST;  Surgeon: Betha Loa, MD;  Location: [facility] OR;  Service: Orthopedics;  Laterality: Right;  60 MIN    Current Medications: Outpatient Medications Prior to Visit  Medication Sig Dispense Refill   amLODipine (NORVASC) 5 MG tablet Take 1 tablet (5 mg total) by mouth daily. 90 tablet 3    buPROPion (WELLBUTRIN XL) 150 MG 24 hr tablet Take 450 mg by mouth every morning.  3   carvedilol (COREG) 25 MG tablet TAKE 1 TABLET(25 MG) BY MOUTH TWICE DAILY 180 tablet 3   mirtazapine (REMERON) 30 MG tablet Take 30 mg by mouth at bedtime.     pantoprazole (PROTONIX) 40 MG tablet TAKE 1 TABLET(40 MG) BY MOUTH TWICE DAILY BEFORE A MEAL 180 tablet 3   rifaximin (XIFAXAN) 550 MG TABS tablet Take 1 tablet (550 mg total) by mouth 2 (two) times daily. 28 tablet 0   rosuvastatin (CRESTOR) 10 MG tablet Take 1 tablet by mouth daily.     Suvorexant (BELSOMRA) 20 MG TABS Take 20 mg by mouth daily.     valsartan (DIOVAN) 80 MG tablet Take 1 tablet (80 mg total) by mouth 2 (two) times daily. 180 tablet 3   famotidine (PEPCID) 40 MG tablet TAKE 1 TABLET(40 MG) BY MOUTH AT BEDTIME 90 tablet 1   hydrALAZINE (APRESOLINE) 10 MG tablet Take 1 tablet (10 mg total) by mouth as needed (as needed for systolic Blood pressures greater than 170). 30 tablet 1   No facility-administered medications prior to visit.     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  Tobacco Use   Smoking status: Former    Current packs/day: 0.50    Average packs/day: 0.5 packs/day for 15.0 years (7.5 ttl pk-yrs)    Types: Cigarettes   Smokeless tobacco: Never  Vaping Use   Vaping status: Never Used  Substance and Sexual Activity   Alcohol use: Yes    Alcohol/week: 0.0 standard drinks of alcohol    Comment: 2-3 per day   Drug use: No   Sexual activity: Not on file  Other Topics Concern   Not on file  Social History Narrative   Not on file   Social Determinants of Health   Financial Resource Strain: Not on file  Food  Insecurity: Not on file  Transportation Needs: Not on file  Physical Activity: Not on file  Stress: Not on file  Social Connections: Unknown (07/06/2023)   Received from [facility]   Social Network    Social Network: Not on file     Family History:  The patient's family history includes Colon cancer in her maternal grandmother; Diverticulosis in her maternal grandfather; HIV in her brother; Heart attack in her father;  Heart disease in her father; Stomach cancer in her maternal grandmother; Stroke in her paternal grandfather.   ROS:   Please see the history of present illness.    ROS All other systems reviewed and are negative.   PHYSICAL EXAM:   VS:  BP (!) 142/58   Pulse (!) 59   Ht 5\' 3"  (1.6 m)   Wt 122 lb 4 oz (55.5 kg)   SpO2 97%   BMI 21.66 kg/m    GEN: Well nourished, well developed, in no acute distress  HEENT: normal  Neck: no JVD, carotid bruits, or masses Cardiac: RRR; there is a gr 1-2/6 systolic ejection murmur RUSB with a blowing 3/6 diastolic murmur, no edema  Respiratory:  clear to auscultation bilaterally, normal work of breathing GI: soft, nontender, nondistended, + BS MS: no deformity or atrophy  Skin: warm and dry, no rash Neuro:  Alert and Oriented x 3, Strength and sensation are intact Psych: euthymic mood, full affect  Wt Readings from Last 3 Encounters:  08/02/23 122 lb 4 oz (55.5 kg)  07/09/23 121 lb (54.9 kg)  05/28/23 122 lb (55.3 kg)      Studies/Labs Reviewed:   EKG:  EKG is  not ordered today.  I reviewed recent Ecg from her ED visit and there was no change compared to May.   Recent Labs: 09/19/2022: ALT 18; BUN 13; Creatinine, Ser 0.68; Hemoglobin 13.0; Platelets 229.0; Potassium 3.9; Sodium 143   Lipid Panel    Component Value Date/Time   CHOL 190 07/19/2016 1050   TRIG 77 07/19/2016 1050   HDL 86 07/19/2016 1050   CHOLHDL 2.2 07/19/2016 1050   VLDL 15 07/19/2016 1050   LDLCALC 89 07/19/2016 1050  Dated 04/14/20: normal  CBC Dated 11/14/20: cholesterol 198, triglycerides 80, HDL 77, LDL 105. CMET and TSH normal Dated 11/16/21: cholesterol 142, triglycerides 61, HDL 60, LDL 70. CMET and TSH normal Dated 09/19/22: normal CBC.  Dated 12/25/22: cholesterol 144, triglycerides 66, HDL 73, LDL 58, CMET and TSH normal.  Dated 07/09/23: CMET and CBC normal.    Additional studies/ records that were reviewed today include:  Myoview 07/13/16:Study Highlights   Nuclear stress EF: 60%. The left ventricular ejection fraction is normal (55-65%). There was no ST segment deviation noted during stress. There is a small defect of mild severity present in the apical lateral and apical location. The defect is reversible and is consistent with a small area of ischemia although shifting breast attenuation artifact. is also a possiblity. This is an intermediate risk study due to apical reversible defect.   Echo: 07/23/16:Study Conclusions   - Left ventricle: The cavity size was normal. There was mild focal   basal hypertrophy of the septum. Systolic function was normal.   The estimated ejection fraction was in the range of 60% to 65%.   Wall motion was normal; there were no regional wall motion   abnormalities. - Aortic valve: Probably trileaflet; moderately thickened,   moderately calcified leaflets. Transvalvular velocity was   minimally increased. There was no stenosis. There was severe   regurgitation. - Aorta: Ascending aortic diameter: 44 mm (S). - Aortic root: The aortic root was moderately dilated. - Mitral valve: There was mild regurgitation.  Cardiac cath 07/24/16:Conclusion     The left ventricular systolic function is normal. LV end diastolic pressure is normal. The left ventricular ejection fraction is 55-65% by visual estimate. Prox LAD lesion, 20 %stenosed. Prox RCA to Mid RCA lesion, 10 %stenosed.  1. Mild nonobstructive CAD 2. Normal LV function 3. Normal LVEDP   Plan: medical management.   Event  monitor November 2017: normal   Echo 11/17/19: IMPRESSIONS     1. Left ventricular ejection fraction, by estimation, is 60 to 65%. The  left ventricle has normal function. The left ventricle has no regional  wall motion abnormalities. There is severe asymmetric left ventricular  hypertrophy of the basal-septal  segment. Left ventricular diastolic parameters are indeterminate.   2. Right ventricular systolic function is normal. The right ventricular  size is normal. Tricuspid regurgitation signal is inadequate for assessing  PA pressure.   3. The mitral valve is normal in structure. Trivial mitral valve  regurgitation.   4. The aortic valve was not well visualized. Aortic valve regurgitation  is moderate. No aortic stenosis is present.   5. Aortic dilatation noted. There is dilatation of the ascending aorta  measuring 44 mm.   6. The inferior vena cava is normal in size with greater than 50%  respiratory variability, suggesting right atrial pressure of 3 mmHg.   Cardiac monitor 06/2021: Normal sinus rhythm 6 episode of nonsustained VT 4-7 beats Episodes of SVT- appear reentrant with sudden onset and termination. longest lasting 2 minutes and 54 seconds at rate 156 bpm     Patch Wear Time:  14 days and 0 hours (2022-09-29T15:04:27-0400 to 2022-10-13T15:04:27-0400)   Patient had a min HR of 50 bpm, max HR of 188 bpm, and avg HR of 69 bpm. Predominant underlying rhythm was Sinus Rhythm. 6 Ventricular Tachycardia runs occurred, the run with the fastest interval lasting 4 beats with a max rate of 188 bpm, the longest  lasting 7 beats with an avg rate of 128 bpm. 18 Supraventricular Tachycardia runs occurred, the run with the fastest interval lasting 2 mins 54 secs with a max rate of 156 bpm (avg 143 bpm); the run with the fastest interval was also the longest.  Supraventricular Tachycardia was detected within +/- 45 seconds of symptomatic patient event(s). Isolated SVEs were rare (<1.0%), SVE  Couplets were rare (<1.0%), and SVE Triplets were rare (<1.0%). Isolated VEs were rare (<1.0%, 2958), VE Couplets were  rare (<1.0%, 115), and VE Triplets were rare (<1.0%, 7).      Echocardiogram 01/2021: 1. Left ventricular ejection fraction, by estimation, is 65 to 70%. The  left ventricle has normal function. The left ventricle has no regional  wall motion abnormalities. There is mild left ventricular hypertrophy.  Left ventricular diastolic parameters  are consistent with Grade I diastolic dysfunction (impaired relaxation).   2. Right ventricular systolic function is normal. The right ventricular  size is normal.   3. The mitral valve is normal in structure. No evidence of mitral valve  regurgitation. No evidence of mitral stenosis.   4. The aortic valve is normal in structure. Aortic valve regurgitation is  severe. Moderate aortic valve stenosis. Aortic regurgitation PHT measures  415 msec. Aortic valve mean gradient measures 18.0 mmHg. Aortic valve Vmax  measures 3.04 m/s.   5. There is borderline dilatation of the ascending aorta, measuring 39  mm.   6. The inferior vena cava is normal in size with greater than 50%  respiratory variability, suggesting right atrial pressure of 3 mmHg.   Echo 08/01/21: IMPRESSIONS     1. Left ventricular ejection fraction, by estimation, is 60 to 65%. Left  ventricular ejection fraction by 3D volume is 65 %. The left ventricle has  normal function. The left ventricle has  no regional wall motion  abnormalities. There is moderate left  ventricular hypertrophy. Left ventricular diastolic parameters are  consistent with Grade I diastolic dysfunction (impaired relaxation).   2. Right ventricular systolic function is normal. The right ventricular  size is normal. Tricuspid regurgitation signal is inadequate for assessing  PA pressure.   3. Left atrial size was severely dilated.   4. The pericardial effusion is anterior to the right ventricle.   5.  The mitral valve is degenerative. Mild mitral valve regurgitation. No  evidence of mitral stenosis.   6. The aortic valve is calcified. Aortic valve regurgitation is moderate  to severe. Moderate aortic valve stenosis. Aortic regurgitation PHT  measures 492 msec. Aortic valve area, by VTI measures 1.00 cm. Aortic  valve mean gradient measures 19.0 mmHg.  Aortic valve Vmax measures 3.01 m/s.   7. Aortic dilatation noted. There is mild dilatation of the aortic root,  measuring 39 mm.   8. The inferior vena cava is normal in size with greater than 50%  respiratory variability, suggesting right atrial pressure of 3 mmHg.   Comparison(s): EF 65%, moderate AS with severe AI, mean gradient 18 mmHg,  peak 36.37mmHg, Ascend Aor 39mm, AI PHT 415 msec, mild MAC. Compared to  study dated 01/23/2021, The mean AVG has increased from 18 to .  Visually AI appears moderate to severe  but only mild by PHT. Echo images are suboptimol. Consider TEE of the AV  if clinically indicated.     EXAM: CT ANGIOGRAPHY CHEST WITH CONTRAST   TECHNIQUE: Multidetector CT imaging of the chest was performed using the standard protocol during bolus administration of intravenous contrast. Multiplanar CT image reconstructions and MIPs were obtained to evaluate the vascular anatomy.   RADIATION DOSE REDUCTION: This exam was performed according to the departmental dose-optimization program which includes automated exposure control, adjustment of the mA and/or kV according to patient size and/or use of iterative reconstruction technique.   CONTRAST:  75mL OMNIPAQUE IOHEXOL 350 MG/ML SOLN   COMPARISON:  CT angiography chest 09/26/2021   FINDINGS: Cardiovascular: Preferential opacification of the pulmonary artery. Stable aneurysmal ascending thoracic aorta measuring up to 4.5 cm in caliber. The descending thoracic aorta is normal in caliber. No dissection. Mild atherosclerotic plaque of the thoracic aorta.  At least 2 vessel coronary artery calcifications. Normal heart size. No significant pericardial effusion. The main pulmonary artery is normal in caliber. No pulmonary embolus.   Mediastinum/Nodes: No enlarged mediastinal, hilar, or axillary lymph nodes. Thyroid gland, trachea, and esophagus demonstrate no significant findings.   Lungs/Pleura: Bilateral lower lobe linear atelectasis versus scarring. No focal consolidation. No pulmonary nodule. No pulmonary mass. No pleural effusion. No pneumothorax.   Upper Abdomen: No acute abnormality.   Musculoskeletal:   No chest wall abnormality.   No suspicious lytic or blastic osseous lesions. No acute displaced fracture. Multilevel degenerative changes of the spine.   Review of the MIP images confirms the above findings.   IMPRESSION: 1. Stable aneurysmal ascending thoracic aorta (4.5 cm). Ascending thoracic aortic aneurysm. Recommend semi-annual imaging followup by CTA or MRA and referral to cardiothoracic surgery if not already obtained. This recommendation follows 2010 ACCF/AHA/AATS/ACR/ASA/SCA/SCAI/SIR/STS/SVM Guidelines for the Diagnosis and Management of Patients With Thoracic Aortic Disease. Circulation. 2010; 121: Z610-R604. Aortic aneurysm NOS (ICD10-I71.9). 2. Aortic Atherosclerosis (ICD10-I70.0) with at least 2 vessel coronary calcifications. 3. No acute intrapulmonary abnormality.     Electronically Signed   By: Tish Frederickson M.D.   On: 03/12/2022 15:18  Echo 03/07/22:  IMPRESSIONS     1. Left ventricular ejection fraction, by estimation, is 60 to 65%. The  left ventricle has normal function. The left ventricle has no regional  wall motion abnormalities. The left ventricular internal cavity size was  mildly dilated. There is severe  concentric left ventricular hypertrophy of the basal-septal segment. Left  ventricular diastolic parameters are consistent with Grade I diastolic  dysfunction (impaired relaxation).    2. Right ventricular systolic function is normal. The right ventricular  size is normal. There is normal pulmonary artery systolic pressure. The  estimated right ventricular systolic pressure is 19.0 mmHg.   3. The mitral valve is degenerative. Mild mitral valve regurgitation. No  evidence of mitral stenosis.   4. The aortic valve is abnormal. Aortic valve regurgitation is severe.  Aortic valve area, by VTI measures 1.29 cm, Aortic valve mean gradient  measures 28.0 mmHg, Aortic valve Vmax measures 3.83 m/s, DVI is 0.41 but  visually the AV does not appear  stenotic. 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 Cannot get accurate waveforms to demonstrate LVOT  gradient compared to AV gradient. The AV   has been reported as moderate AS in the past but in the M Mode and  visually on 2D images the leaflets appear to open well with some AV  sclerosis. Suspect that increased AV gradient and peak velocities are due  in part to increased flow across AV from AI  as well as increased velocity in LVOT from severe BSH.   5. Aortic dilatation noted. Aneurysm of the ascending aorta, measuring 45  mm.   6. The inferior vena cava is normal in size with greater than 50%  respiratory variability, suggesting right atrial pressure of 3 mmHg.   7. Recommend TEE to get planimetry of the AV for accurate measurement of  the AVA as well as assess severity of AI further given LV dilatation.   Echo 08/15/22: IMPRESSIONS     1. Severe AI is present. Gradients across AoV elevated (Vmax 3.7 m/s, MG  31 mmHG) but valve opens well. Suspect gradients are related to  significant AI. Would recommend TEE for clarification. LVIDd is largely  unchanged (49 mm vs 47 mm). The aortic valve   is tricuspid. Aortic valve regurgitation is severe. Mild to moderate  aortic valve stenosis. Aortic valve mean gradient  measures 31.0 mmHg.  Aortic valve Vmax measures 3.75 m/s.   2. Left ventricular ejection fraction, by estimation, is 55 to 60%. The  left ventricle has normal function. The left ventricle has no regional  wall motion abnormalities. Left ventricular diastolic parameters are  consistent with Grade I diastolic  dysfunction (impaired relaxation).   3. Right ventricular systolic function is normal. The right ventricular  size is normal. There is normal pulmonary artery systolic pressure. The  estimated right ventricular systolic pressure is 19.2 mmHg.   4. The mitral valve is grossly normal. Mild mitral valve regurgitation.  No evidence of mitral stenosis.   5. There is severe dilatation of the ascending aorta, measuring 43 mm.   6. The inferior vena cava is normal in size with greater than 50%  respiratory variability, suggesting right atrial pressure of 3 mmHg.   Comparison(s): No significant change from prior study.   CT ANGIOGRAPHY CHEST WITH CONTRAST   TECHNIQUE: Multidetector CT imaging of the chest was performed using the standard protocol during  bolus administration of intravenous contrast. Multiplanar CT image reconstructions and MIPs were obtained to evaluate the vascular anatomy.   RADIATION DOSE REDUCTION: This exam was performed according to the departmental dose-optimization program which includes automated exposure control, adjustment of the mA and/or kV according to patient size and/or use of iterative reconstruction technique.   CONTRAST:  75mL ISOVUE-370 IOPAMIDOL (ISOVUE-370) INJECTION 76%   COMPARISON:  March 12, 2022, September 27, 2022 trauma on March 30, 2021   FINDINGS: Cardiovascular: Cardiomegaly. Predominantly LEFT-sided coronary artery atherosclerotic calcifications. No significant pericardial effusion. Moderate atherosclerotic calcifications of the aorta. ReVisualization of an aneurysm of the ascending thoracic aorta. Ascending thoracic aorta measures  approximately 4.5 by 4.6 cm although the evaluation is limited by motion. This is similar in comparison to prior. Sino-tubular junction measures approximately 3.6 cm. The level at the sinuses of Valsalva measure approximately 4 by 4.1 cm.   Mediastinum/Nodes: Visualized thyroid is unremarkable. No axillary adenopathy or mediastinal adenopathy.   Lungs/Pleura: No pleural effusion or pneumothorax. Scattered atelectasis. LEFT lower lobe pulmonary nodule measures 4 mm, stable dating back to July 2022 (series 9, image 277).   Upper Abdomen: Partial visualization of a dilated pancreatic duct and cyst of the LEFT liver. This is better assessed on recent MRI.   Musculoskeletal: Healing LEFT anterolateral rib fractures.   Review of the MIP images confirms the above findings.   IMPRESSION: 1. Similar aneurysmal dilatation of the ascending thoracic aorta measuring up to 4.6 cm. Recommend semi-annual imaging followup by CTA or MRA and referral to cardiothoracic surgery if not already obtained. This recommendation follows 2010 ACCF/AHA/AATS/ACR/ASA/SCA/SCAI/SIR/STS/SVM Guidelines for the Diagnosis and Management of Patients With Thoracic Aortic Disease. Circulation. 2010; 121: Z610-R604. Aortic aneurysm NOS (ICD10-I71.9) 2. Healing LEFT anterolateral rib fractures. 3. Partial visualization of a dilated pancreatic duct and cyst of the LEFT liver. This is better assessed on recent MRI. 4. Stable 4 mm LEFT lower lobe pulmonary nodule dating back to July 2022. No follow-up needed if patient is low-risk.This recommendation follows the consensus statement: Guidelines for Management of Incidental Pulmonary Nodules Detected on CT Images: From the Fleischner Society 2017; Radiology 2017; 284:228-243.   Aortic Atherosclerosis (ICD10-I70.0).     Electronically Signed  ASSESSMENT:    No diagnosis found.     PLAN:  In order of problems listed above:  1. Hypertension: BP with adequate  control on valsartan, amlodipine and Coreg.  Continue sodium restriction. Widened pulse pressure due to AI  2. Non-obstructive CAD: Cath in 2017 showed mild nonobstructive CAD (pLAD 20%, p-m RCA 10%). CT chest showed mild atherosclerosis, coronary artery calcification.   Continue Crestor.  3. Severe AI/aortic stenosis: Most recent recent echocardiogram in October showed EF 55-60%, with mild LV dilation.  Severe AI. She does have some exercise intolerance. I think we are getting close to needing AVR. She does have exercise intolerance and reviewing serial Echos I think her AI is progressing. We discussed AVR and given her enlarge aorta would likely need open surgery with Aortic grafting and not TAVR. Discussed this all with her. I think the next step would be to do right and left heart cath. She would like to wait until after the holidays so I will see her back in Jan to discuss again  4. Ascending aortic aneurysm: No change by CT in Feb. Follows with Dr Dorris Fetch. Continue optimal BP control. Recommend avoidance of quinolone antibiotics.   5. PSVT/syncope: Outpatient monitor in 06/2021 in the setting of intermittent tachypalpitations showed PSVT, longest episode  lasting 2 minutes 34 seconds.  no sustained arrhythmia. Continue Coreg. Her more recent episodes c/w vasovagal episodes in setting of acute injury/bleeding.    6. Hyperlipidemia: LDL was 58.  Continue Crestor.     Medication Adjustments/Labs and Tests Ordered: Current medicines are reviewed at length with the patient today.  Concerns regarding medicines are outlined above.  Medication changes, Labs and Tests ordered today are listed in the Patient Instructions below. Patient Instructions  Medication Instructions:  Continue same medications *If you need a refill on your cardiac medications before your next appointment, please call your pharmacy*   Lab Work: None ordered   Testing/Procedures: None ordered   Follow-Up: At [facility], you and your health needs are our priority.  As part of our continuing mission to provide you with exceptional heart care, we have created designated Provider Care Teams.  These Care Teams include your primary Cardiologist (physician) and Advanced Practice Providers (APPs -  Physician Assistants and Nurse Practitioners) who all work together to provide you with the care you need, when you need it.  We recommend signing up for the patient portal called "MyChart".  Sign up information is provided on this After Visit Summary.  MyChart is used to connect with patients for Virtual Visits (Telemedicine).  Patients are able to view lab/test results, encounter notes, upcoming appointments, etc.  Non-urgent messages can be sent to your provider as well.   To learn more about what you can do with MyChart, go to [email or web address].    Your next appointment:  Wed 09/25/2023 at 11:40 am    Provider:  Dr.Latreshia Beauchaine      Signed, Hannan Hutmacher [place], MD  08/02/2023 11:25 AM    [facility] [address], [place], [place], [address] [phone]

## 2023-08-02 ENCOUNTER — Ambulatory Visit: Payer: Medicare PPO | Attending: Cardiology | Admitting: Cardiology

## 2023-08-02 ENCOUNTER — Encounter: Payer: Self-pay | Admitting: Cardiology

## 2023-08-02 VITALS — BP 142/58 | HR 59 | Ht 63.0 in | Wt 122.2 lb

## 2023-08-02 DIAGNOSIS — I471 Supraventricular tachycardia, unspecified: Secondary | ICD-10-CM | POA: Diagnosis not present

## 2023-08-02 DIAGNOSIS — I10 Essential (primary) hypertension: Secondary | ICD-10-CM | POA: Diagnosis not present

## 2023-08-02 DIAGNOSIS — I7121 Aneurysm of the ascending aorta, without rupture: Secondary | ICD-10-CM

## 2023-08-02 DIAGNOSIS — I351 Nonrheumatic aortic (valve) insufficiency: Secondary | ICD-10-CM

## 2023-08-02 NOTE — Patient Instructions (Addendum)
Medication Instructions:  Continue same medications *If you need a refill on your cardiac medications before your next appointment, please call your pharmacy*   Lab Work: None ordered   Testing/Procedures: None ordered   Follow-Up: At [facility], you and your health needs are our priority.  As part of our continuing mission to provide you with exceptional heart care, we have created designated Provider Care Teams.  These Care Teams include your primary Cardiologist (physician) and Advanced Practice Providers (APPs -  Physician Assistants and Nurse Practitioners) who all work together to provide you with the care you need, when you need it.  We recommend signing up for the patient portal called "MyChart".  Sign up information is provided on this After Visit Summary.  MyChart is used to connect with patients for Virtual Visits (Telemedicine).  Patients are able to view lab/test results, encounter notes, upcoming appointments, etc.  Non-urgent messages can be sent to your provider as well.   To learn more about what you can do with MyChart, go to [email or web address].    Your next appointment:  Wed 09/25/2023 at 11:40 am    Provider:  Dr.Jordan

## 2023-08-05 ENCOUNTER — Telehealth: Payer: Self-pay | Admitting: Gastroenterology

## 2023-08-05 NOTE — Telephone Encounter (Signed)
Inbound call from patient in regards to antibiotic she was previously prescribed. Patient states she is now done and requesting to speak with a nurse.   Please advise.

## 2023-08-06 ENCOUNTER — Other Ambulatory Visit: Payer: Self-pay

## 2023-08-06 MED ORDER — RIFAXIMIN 550 MG PO TABS: 550.0000 mg | ORAL_TABLET | Freq: Two times a day (BID) | ORAL | 0 refills | Status: DC

## 2023-08-06 NOTE — Telephone Encounter (Signed)
Left message on machine to call back  Xifaxan has been sent to the pharmacy

## 2023-08-06 NOTE — Telephone Encounter (Signed)
The pt has been advised of the recommendations per Dr Meridee Score to trial another course of xifaxan.  She will pick up today. She is aware to call our office to update in a few weeks.

## 2023-08-06 NOTE — Telephone Encounter (Signed)
Patty, I am sorry to hear about the issues. Would be interesting if she noticed any improvement with the Xifaxan, because a second round of treatment could be required to help improve her chances of getting better. If she had some improvement I recommend 1 more course of Xifaxan. If she had no change in her symptoms, then I think cholestyramine 1 g daily to be initiated is reasonable. Thanks. GM

## 2023-08-06 NOTE — Telephone Encounter (Signed)
Left message on machine to call back  

## 2023-08-06 NOTE — Telephone Encounter (Signed)
The pt states that she finished the xifaxan and still continues to have diarrhea.  Per the last office note if issues persist colon may be needed. However, she has some cardiac concerns and will be having some procedures in a few months.  She prefers to wait until the cardiac testing is complete before she proceeds with colonoscopy.  Would you like to send cholestyramine in the meantime? (Per office note)

## 2023-08-26 ENCOUNTER — Telehealth: Payer: Self-pay | Admitting: Cardiology

## 2023-08-26 NOTE — Telephone Encounter (Signed)
Called and spoke to patient who wants to have a second opinion concerning her surgery. She was calling to find out the process of getting second opinion. I told patient she would find the provider she wants to give her the second opinion, have them to send our office an ROI form and we would send her medical records over to them. She also stated she thought she was scheduled for a heart cath on 09/25/23. Informed her she is scheduled for a regular office visit for that day and she could discuss getting the heart cath at that time Patient verbalized understanding and agree.

## 2023-08-26 NOTE — Telephone Encounter (Signed)
Patient is requesting a call back to discuss possibly getting a second opinion prior to upcoming procedure. Requesting call back.

## 2023-08-27 DIAGNOSIS — F331 Major depressive disorder, recurrent, moderate: Secondary | ICD-10-CM | POA: Diagnosis not present

## 2023-08-30 NOTE — Telephone Encounter (Signed)
Called patient left message on personal voice mail to call back. 

## 2023-08-30 NOTE — Telephone Encounter (Signed)
Spoke to patient she stated she will keep appointment scheduled with Dr.Jordan 1/15 at 11:40 am.

## 2023-08-31 ENCOUNTER — Other Ambulatory Visit: Payer: Self-pay | Admitting: Cardiology

## 2023-09-05 ENCOUNTER — Encounter (HOSPITAL_COMMUNITY): Payer: Self-pay | Admitting: Gastroenterology

## 2023-09-05 NOTE — Progress Notes (Signed)
Attempted to obtain medical history for pre op call via telephone, unable to reach at this time. HIPAA compliant voicemail message left requesting return call to pre surgical testing department.

## 2023-09-12 ENCOUNTER — Telehealth: Payer: Self-pay

## 2023-09-12 NOTE — Progress Notes (Signed)
 Pre op call eval Name:Erika Wu  Avon Blind MD Cardiologist-Jordan MD  EKG-01/10/23 Echo-06/26/23 Cath-07/24/16 Stress-07/14/23 ICD/PM- n/a Blood thinner-n/a GLP-1-n/a  Hx: Murmur, HTN, CAD, PSVT, Severe Aortic Insufficiency. Last saw cardiology 08/02/23, they discussed AVR vs TAVR and patient needs cath but wanted to wait until after holidays. Due to see cards again 09/25/23. Patient reports no cardiac issues right now, no SOB or chest pains, no assistive devices.  Anesthesia Review: Yes

## 2023-09-12 NOTE — Telephone Encounter (Signed)
 I have sent a secure chat to Odetta Curly, RN to see notes from preop APP Rosaline Bane, NP.  Swinyer, Rosaline HERO, NP14 minutes ago (2:17 PM)    Please notify requesting provider that patient is undergoing work up for severe aortic valve disease (AI and AS). I would recommend that unless the endoscopy is urgent, that she postpone. If EGD is urgent, we will need to get clearance from Dr. Jordan.          Pt has appt 09/25/23 with Dr. Jordan. I will also fax these notes to Dr. Wilhelmenia as well.

## 2023-09-12 NOTE — Telephone Encounter (Signed)
 The case has been cancelled and recall added for 3 months. The pt has been advised

## 2023-09-12 NOTE — Telephone Encounter (Signed)
 Hey Erika Wu sorry this is so short notice but this pt just called me back as she was out of town. She is having procedure Monday 1/6 but anesthesia has requested cardiac clearance.

## 2023-09-12 NOTE — Telephone Encounter (Signed)
 Please notify requesting provider that patient is undergoing work up for severe aortic valve disease (AI and AS). I would recommend that unless the endoscopy is urgent, that she postpone. If EGD is urgent, we will need to get clearance from Dr. [place].

## 2023-09-12 NOTE — Telephone Encounter (Signed)
 I also called Patty, RN to try and reach her by phone though she was not available. I stated that I did send her secure chat as well, so she can reach my by secure chat as well.

## 2023-09-12 NOTE — Telephone Encounter (Signed)
 OK for GI workup to be put on hold for awhile while she is getting her Cardiac/Valve workup completed. GM  Patty, Put patient on for followup in 63-months time, if we haven't heard from Cardiac team first. Thanks. GM

## 2023-09-12 NOTE — Telephone Encounter (Signed)
 Inbound call from patient, states she would like to speak to a nurse in regards to scheduling an appointment with Dr. Meridee Score, patient states she cannot wait until March for an appointment because her symptoms are not improving.

## 2023-09-12 NOTE — Telephone Encounter (Signed)
 [facility] Pre-operative Risk Assessment     Request for surgical clearance:     Endoscopy Procedure  What type of surgery is being performed?     EUS   When is this surgery scheduled?     09/19/23  What type of clearance is required ?   Medical   Are there any medications that need to be held prior to surgery and how long? NO  Practice name and name of physician performing surgery?      [facility]  What is your office phone and fax number?      Phone- [phone]  Fax- [phone]  Anesthesia type (None, local, MAC, general) ?       MAC   Please route your response to Odetta Curly ASAP pt appt on Thursday thank you

## 2023-09-13 ENCOUNTER — Telehealth: Payer: Self-pay | Admitting: Cardiology

## 2023-09-13 ENCOUNTER — Other Ambulatory Visit: Payer: Self-pay

## 2023-09-13 MED ORDER — CHOLESTYRAMINE POWD: 1.0000 g | Freq: Every day | 0 refills | Status: DC

## 2023-09-13 NOTE — Telephone Encounter (Signed)
 Pt's daughter would like the Dr. to refer the pt over to a cardiothoracic surgeon. Please advise

## 2023-09-13 NOTE — Telephone Encounter (Signed)
 Agree with Cholestyramine initiation. Thanks. GM

## 2023-09-13 NOTE — Telephone Encounter (Addendum)
 Inbound call from patient's daughter requesting a call to discuss further about cancellation of 1/6 procedure for patient. Please advise, thank you.

## 2023-09-13 NOTE — Telephone Encounter (Signed)
 Prescription has been sent and pt daughter advised

## 2023-09-13 NOTE — Telephone Encounter (Signed)
 I spoke with the pt and explained that per anesthesia the pt will need to have cardiology work up completed before procedure can proceed.  She has asked that I speak with Dr Wilhelmenia to see what can be done for her in the meantime.  Per last recommendation from 07/2023 cholestyramine  1g may be initiated.  Is this ok to send?

## 2023-09-13 NOTE — Telephone Encounter (Signed)
 Attempted to call [facility], no answer left message requesting a call back.

## 2023-09-16 ENCOUNTER — Ambulatory Visit (HOSPITAL_COMMUNITY): Admission: RE | Admit: 2023-09-16 | Payer: Medicare PPO | Source: Home / Self Care | Admitting: Gastroenterology

## 2023-09-16 SURGERY — UPPER ESOPHAGEAL ENDOSCOPIC ULTRASOUND (EUS)
Anesthesia: Monitor Anesthesia Care

## 2023-09-16 NOTE — Telephone Encounter (Signed)
  Daughter is returning call 

## 2023-09-16 NOTE — Telephone Encounter (Signed)
 Patient identification verified by 2 forms. Erika Cooks, RN    Called and spoke to patients daughter Erika Wu states:  -patient reports previous discussion with Dr. Jordan regarding cardiothoracic surgery   -was advised to outreach for referral   -patient would like referral for Dr. Godfrey at [facility] cardiothoracic surgery   -patient Endoscopy was canceled today, unsure why  Informed [place]:   -Message sent to Dr. Jordan for referral request   -will likely need to follow up at 1/15 OV   -Endoscopy canceled per GI due to patient getting cardiac/valve workup completed  [facility] verbalized understanding, no questions at this time

## 2023-09-21 NOTE — H&P (View-Only) (Signed)
Cardiology Office Note    Date:  09/25/2023   ID:  Erika Wu, Erika Wu 03/01/1946, MRN [id number]  PCP:  Melida Quitter, MD  Cardiologist:  Arham Symmonds [place], MD    History of Present Illness:  Erika Wu is a 78 y.o. female seen for follow up  of chronic AI.   She also has a history of aortic stenosis/AI. She had a normal Myoview study in July 2016. She also had an Echo at that time showing Aortic root enlargement with moderate  AI. This was stable from prior exams. Seen in October 2017 with symptoms of chest pain and palpitations. Echo reported severe AI with dilated aorta of 4.4 cm. EF and LV dimensions were normal. Myoview study was abnormal with evidence of apical ischemia. This led to a cardiac cath in November 2017 showing minimal nonobstructive CAD. Normal LV function and normal LVEDP. She also wore an event monitor that was normal with no arrhythmia.   Echo in March 2021 showed normal EF with moderate AI.   Echocardiogram obtained on 01/23/2021 showed EF 65 to 70%, grade 1 DD, mild LVH, moderate to severe AI with moderate aortic stenosis, borderline dilated ascending aorta measuring at 39 mm.  CTA of the chest obtained on 03/30/2021 showed a  thoracic aortic aneurysm measuring at 4.5 cm.   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     HIDA scan obtained on 05/22/2021 was normal.  She was seen in September 2022 with complaints of intermittent palpitations. She wore an event monitor which showed some minor NSVT. She did have some episodes of SVT longest lasting 3 minutes. Metoprolol dose was increased. When seen in November symptoms were improved.   Follow up CT in February showed stable aortic size 4.5 cm. Last Echo in October showed severe AI, mild AS. EF preserved. LV dimension OK.   She  is seen today with her daughter. In October she was visiting family in [place] when she felt ill with cough and no energy. Was seen in ED. CXR was clear. Ecg no change. Troponin normal. Chemistries and CBC Ok. Tested negative for flu and Covid. Since then she is better but still has a mild cough. Prior to this she has noted that when she walks she sometimes will give out more easily and have to stop. No swelling, orthopnea or PND.   She is seen with her daughter today. Still notes over past 6 months that she gives out more easily. Slight swelling in ankles. No chest pain. No SOB. Is having GI complaints of diarrhea, nausea. Seeing Dr Meridee Score. Considered for EGD.     Past Medical History:  Diagnosis Date   Allergy    Anxiety    Arthritis    Chest pain    a. 03/2015 Lexiscan MV: EF 59%, no ischemia/infarct.   Colon polyps    COVID-19 virus infection    09-22-2020, fully vax'd    Depression    GERD (gastroesophageal reflux disease)    Heart murmur    Hemorrhoids    Hyperlipidemia    Hypertension    Moderate aortic insufficiency    a. 03/2015 Echo: EF 55-60%, no rwma, mild LVH, Gr 1 DD, mild AS, mod AI (bicuspid AoV by report, poorly visualized on this study), Asc Ao diam 44mm, triv MR.  Past Surgical History:  Procedure Laterality Date   CARDIAC CATHETERIZATION N/A 07/24/2016   Procedure: Left Heart Cath and Coronary Angiography;  Surgeon: Saya Mccoll M [place], MD;  Location: [facility] INVASIVE CV LAB;  Service: Cardiovascular;  Laterality: N/A;   COLONOSCOPY     HAND DEBRIDEMENT  07/2022   right hand   NERVE, TENDON AND ARTERY REPAIR Right 08/07/2022   Procedure: NERVE, TENDON AND ARTERY REPAIR;  Surgeon: Betha Loa, MD;  Location: [facility] OR;  Service: Orthopedics;  Laterality: Right;   POLYPECTOMY     TONSILLECTOMY AND ADENOIDECTOMY     TUBAL LIGATION     WOUND EXPLORATION Right 08/07/2022   Procedure: WOUND EXPLORATION RIGHT WRIST;  Surgeon: Betha Loa, MD;  Location: [facility] OR;  Service:  Orthopedics;  Laterality: Right;  60 MIN    Current Medications: Outpatient Medications Prior to Visit  Medication Sig Dispense Refill   amLODipine (NORVASC) 5 MG tablet TAKE 1 TABLET(5 MG) BY MOUTH DAILY 90 tablet 3   buPROPion (WELLBUTRIN XL) 150 MG 24 hr tablet Take 450 mg by mouth every morning.  3   carvedilol (COREG) 25 MG tablet TAKE 1 TABLET(25 MG) BY MOUTH TWICE DAILY 180 tablet 3   mirtazapine (REMERON) 30 MG tablet Take 30 mg by mouth at bedtime.     pantoprazole (PROTONIX) 40 MG tablet TAKE 1 TABLET(40 MG) BY MOUTH TWICE DAILY BEFORE A MEAL 180 tablet 3   rosuvastatin (CRESTOR) 10 MG tablet Take 1 tablet by mouth daily.     Suvorexant (BELSOMRA) 20 MG TABS Take 20 mg by mouth daily.     valsartan (DIOVAN) 80 MG tablet Take 1 tablet (80 mg total) by mouth 2 (two) times daily. 180 tablet 3   hydrALAZINE (APRESOLINE) 10 MG tablet Take 1 tablet (10 mg total) by mouth as needed (as needed for systolic Blood pressures greater than 170). 30 tablet 1   Cholestyramine POWD 1 g by Does not apply route daily. (Patient not taking: Reported on 09/25/2023) 100 g 0   rifaximin (XIFAXAN) 550 MG TABS tablet Take 1 tablet (550 mg total) by mouth 2 (two) times daily. (Patient not taking: Reported on 09/25/2023) 28 tablet 0   No facility-administered medications prior to visit.     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  Tobacco Use   Smoking status: Former    Current packs/day: 0.50    Average packs/day: 0.5 packs/day for 15.0 years (7.5 ttl pk-yrs)    Types: Cigarettes   Smokeless tobacco: Never  Vaping Use   Vaping status: Never Used  Substance and Sexual Activity   Alcohol use: Yes     Alcohol/week: 0.0 standard drinks of alcohol    Comment: 2-3 per day   Drug use: No   Sexual activity: Not Currently  Other Topics Concern   Not on file  Social History Narrative   Not on file   Social Drivers of Health   Financial Resource Strain: Not on file  Food Insecurity: Not on file  Transportation Needs: Not on file  Physical Activity: Not on file  Stress: Not on file  Social Connections: Unknown (07/06/2023)  Received from [employer]   Social Network    Social Network: Not on file     Family History:  The patient's family history includes Colon cancer in her maternal grandmother; Diverticulosis in her maternal grandfather; HIV in her brother; Heart attack in her father; Heart disease in her father; Stomach cancer in her maternal grandmother; Stroke in her paternal grandfather.   ROS:   Please see the history of present illness.    ROS All other systems reviewed and are negative.   PHYSICAL EXAM:   VS:  BP 124/60   Pulse (!) 58   Ht 5\' 2"  (1.575 m)   Wt 121 lb (54.9 kg)   SpO2 97%   BMI 22.13 kg/m    GEN: Well nourished, well developed, in no acute distress  HEENT: normal  Neck: no JVD, carotid bruits, or masses Cardiac: RRR; there is a gr 2/6 systolic ejection murmur RUSB with a blowing 3/6 diastolic murmur, no edema  Respiratory:  clear to auscultation bilaterally, normal work of breathing GI: soft, nontender, nondistended, + BS MS: no deformity or atrophy  Skin: warm and dry, no rash Neuro:  Alert and Oriented x 3, Strength and sensation are intact Psych: euthymic mood, full affect  Wt Readings from Last 3 Encounters:  09/25/23 121 lb (54.9 kg)  08/02/23 122 lb 4 oz (55.5 kg)  07/09/23 121 lb (54.9 kg)      Studies/Labs Reviewed:   EKG Interpretation Date/Time:  Wednesday September 25 2023 12:01:38 EST Ventricular Rate:  58 PR Interval:  198 QRS Duration:  112 QT Interval:  440 QTC Calculation: 431 R Axis:   18  Text Interpretation: Sinus  bradycardia with Fusion complexes Minimal voltage criteria for LVH, may be normal variant ( Cornell product ) Inferior infarct , age undetermined When compared with ECG of Jan 10, 2023 Fusion complexes are now Present Confirmed by [place], Reyes Fifield [phone]) on 09/25/2023 12:05:53 PM    Recent Labs: No results found for requested labs within last 365 days.   Lipid Panel    Component Value Date/Time   CHOL 190 07/19/2016 1050   TRIG 77 07/19/2016 1050   HDL 86 07/19/2016 1050   CHOLHDL 2.2 07/19/2016 1050   VLDL 15 07/19/2016 1050   LDLCALC 89 07/19/2016 1050  Dated 04/14/20: normal CBC Dated 11/14/20: cholesterol 198, triglycerides 80, HDL 77, LDL 105. CMET and TSH normal Dated 11/16/21: cholesterol 142, triglycerides 61, HDL 60, LDL 70. CMET and TSH normal Dated 09/19/22: normal CBC.  Dated 12/25/22: cholesterol 144, triglycerides 66, HDL 73, LDL 58, CMET and TSH normal.  Dated 07/09/23: CMET and CBC normal.    Additional studies/ records that were reviewed today include:  Myoview 07/13/16:Study Highlights   Nuclear stress EF: 60%. The left ventricular ejection fraction is normal (55-65%). There was no ST segment deviation noted during stress. There is a small defect of mild severity present in the apical lateral and apical location. The defect is reversible and is consistent with a small area of ischemia although shifting breast attenuation artifact. is also a possiblity. This is an intermediate risk study due to apical reversible defect.   Echo: 07/23/16:Study Conclusions   - Left ventricle: The cavity size was normal. There was mild focal   basal hypertrophy of the septum. Systolic function was normal.   The estimated ejection fraction was in the range of 60% to 65%.   Wall motion was normal; there were no regional wall motion   abnormalities. - Aortic  valve: Probably trileaflet; moderately thickened,   moderately calcified leaflets. Transvalvular velocity was   minimally increased. There  was no stenosis. There was severe   regurgitation. - Aorta: Ascending aortic diameter: 44 mm (S). - Aortic root: The aortic root was moderately dilated. - Mitral valve: There was mild regurgitation.  Cardiac cath 07/24/16:Conclusion     The left ventricular systolic function is normal. LV end diastolic pressure is normal. The left ventricular ejection fraction is 55-65% by visual estimate. Prox LAD lesion, 20 %stenosed. Prox RCA to Mid RCA lesion, 10 %stenosed.   1. Mild nonobstructive CAD 2. Normal LV function 3. Normal LVEDP   Plan: medical management.   Event monitor November 2017: normal   Echo 11/17/19: IMPRESSIONS     1. Left ventricular ejection fraction, by estimation, is 60 to 65%. The  left ventricle has normal function. The left ventricle has no regional  wall motion abnormalities. There is severe asymmetric left ventricular  hypertrophy of the basal-septal  segment. Left ventricular diastolic parameters are indeterminate.   2. Right ventricular systolic function is normal. The right ventricular  size is normal. Tricuspid regurgitation signal is inadequate for assessing  PA pressure.   3. The mitral valve is normal in structure. Trivial mitral valve  regurgitation.   4. The aortic valve was not well visualized. Aortic valve regurgitation  is moderate. No aortic stenosis is present.   5. Aortic dilatation noted. There is dilatation of the ascending aorta  measuring 44 mm.   6. The inferior vena cava is normal in size with greater than 50%  respiratory variability, suggesting right atrial pressure of 3 mmHg.   Cardiac monitor 06/2021: Normal sinus rhythm 6 episode of nonsustained VT 4-7 beats Episodes of SVT- appear reentrant with sudden onset and termination. longest lasting 2 minutes and 54 seconds at rate 156 bpm     Patch Wear Time:  14 days and 0 hours (2022-09-29T15:04:27-0400 to 2022-10-13T15:04:27-0400)   Patient had a min HR of 50 bpm, max HR of 188  bpm, and avg HR of 69 bpm. Predominant underlying rhythm was Sinus Rhythm. 6 Ventricular Tachycardia runs occurred, the run with the fastest interval lasting 4 beats with a max rate of 188 bpm, the longest  lasting 7 beats with an avg rate of 128 bpm. 18 Supraventricular Tachycardia runs occurred, the run with the fastest interval lasting 2 mins 54 secs with a max rate of 156 bpm (avg 143 bpm); the run with the fastest interval was also the longest.  Supraventricular Tachycardia was detected within +/- 45 seconds of symptomatic patient event(s). Isolated SVEs were rare (<1.0%), SVE Couplets were rare (<1.0%), and SVE Triplets were rare (<1.0%). Isolated VEs were rare (<1.0%, 2958), VE Couplets were  rare (<1.0%, 115), and VE Triplets were rare (<1.0%, 7).      Echocardiogram 01/2021: 1. Left ventricular ejection fraction, by estimation, is 65 to 70%. The  left ventricle has normal function. The left ventricle has no regional  wall motion abnormalities. There is mild left ventricular hypertrophy.  Left ventricular diastolic parameters  are consistent with Grade I diastolic dysfunction (impaired relaxation).   2. Right ventricular systolic function is normal. The right ventricular  size is normal.   3. The mitral valve is normal in structure. No evidence of mitral valve  regurgitation. No evidence of mitral stenosis.   4. The aortic valve is normal in structure. Aortic valve regurgitation is  severe. Moderate aortic valve stenosis. Aortic regurgitation PHT measures  415  msec. Aortic valve mean gradient measures 18.0 mmHg. Aortic valve Vmax  measures 3.04 m/s.   5. There is borderline dilatation of the ascending aorta, measuring 39  mm.   6. The inferior vena cava is normal in size with greater than 50%  respiratory variability, suggesting right atrial pressure of 3 mmHg.   Echo 08/01/21: IMPRESSIONS     1. Left ventricular ejection fraction, by estimation, is 60 to 65%. Left  ventricular  ejection fraction by 3D volume is 65 %. The left ventricle has  normal function. The left ventricle has no regional wall motion  abnormalities. There is moderate left  ventricular hypertrophy. Left ventricular diastolic parameters are  consistent with Grade I diastolic dysfunction (impaired relaxation).   2. Right ventricular systolic function is normal. The right ventricular  size is normal. Tricuspid regurgitation signal is inadequate for assessing  PA pressure.   3. Left atrial size was severely dilated.   4. The pericardial effusion is anterior to the right ventricle.   5. The mitral valve is degenerative. Mild mitral valve regurgitation. No  evidence of mitral stenosis.   6. The aortic valve is calcified. Aortic valve regurgitation is moderate  to severe. Moderate aortic valve stenosis. Aortic regurgitation PHT  measures 492 msec. Aortic valve area, by VTI measures 1.00 cm. Aortic  valve mean gradient measures 19.0 mmHg.  Aortic valve Vmax measures 3.01 m/s.   7. Aortic dilatation noted. There is mild dilatation of the aortic root,  measuring 39 mm.   8. The inferior vena cava is normal in size with greater than 50%  respiratory variability, suggesting right atrial pressure of 3 mmHg.   Comparison(s): EF 65%, moderate AS with severe AI, mean gradient 18 mmHg,  peak 36.70mmHg, Ascend Aor 39mm, AI PHT 415 msec, mild MAC. Compared to  study dated 01/23/2021, The mean AVG has increased from 18 to .  Visually AI appears moderate to severe  but only mild by PHT. Echo images are suboptimol. Consider TEE of the AV  if clinically indicated.     EXAM: CT ANGIOGRAPHY CHEST WITH CONTRAST   TECHNIQUE: Multidetector CT imaging of the chest was performed using the standard protocol during bolus administration of intravenous contrast. Multiplanar CT image reconstructions and MIPs were obtained to evaluate the vascular anatomy.   RADIATION DOSE REDUCTION: This exam was performed  according to the departmental dose-optimization program which includes automated exposure control, adjustment of the mA and/or kV according to patient size and/or use of iterative reconstruction technique.   CONTRAST:  75mL OMNIPAQUE IOHEXOL 350 MG/ML SOLN   COMPARISON:  CT angiography chest 09/26/2021   FINDINGS: Cardiovascular: Preferential opacification of the pulmonary artery. Stable aneurysmal ascending thoracic aorta measuring up to 4.5 cm in caliber. The descending thoracic aorta is normal in caliber. No dissection. Mild atherosclerotic plaque of the thoracic aorta. At least 2 vessel coronary artery calcifications. Normal heart size. No significant pericardial effusion. The main pulmonary artery is normal in caliber. No pulmonary embolus.   Mediastinum/Nodes: No enlarged mediastinal, hilar, or axillary lymph nodes. Thyroid gland, trachea, and esophagus demonstrate no significant findings.   Lungs/Pleura: Bilateral lower lobe linear atelectasis versus scarring. No focal consolidation. No pulmonary nodule. No pulmonary mass. No pleural effusion. No pneumothorax.   Upper Abdomen: No acute abnormality.   Musculoskeletal:   No chest wall abnormality.   No suspicious lytic or blastic osseous lesions. No acute displaced fracture. Multilevel degenerative changes of the spine.   Review of the MIP images confirms  the above findings.   IMPRESSION: 1. Stable aneurysmal ascending thoracic aorta (4.5 cm). Ascending thoracic aortic aneurysm. Recommend semi-annual imaging followup by CTA or MRA and referral to cardiothoracic surgery if not already obtained. This recommendation follows 2010 ACCF/AHA/AATS/ACR/ASA/SCA/SCAI/SIR/STS/SVM Guidelines for the Diagnosis and Management of Patients With Thoracic Aortic Disease. Circulation. 2010; 121: Z610-R604. Aortic aneurysm NOS (ICD10-I71.9). 2. Aortic Atherosclerosis (ICD10-I70.0) with at least 2 vessel coronary calcifications. 3. No  acute intrapulmonary abnormality.     Electronically Signed   By: Tish Frederickson M.D.   On: 03/12/2022 15:18  Echo 03/07/22: IMPRESSIONS     1. Left ventricular ejection fraction, by estimation, is 60 to 65%. The  left ventricle has normal function. The left ventricle has no regional  wall motion abnormalities. The left ventricular internal cavity size was  mildly dilated. There is severe  concentric left ventricular hypertrophy of the basal-septal segment. Left  ventricular diastolic parameters are consistent with Grade I diastolic  dysfunction (impaired relaxation).   2. Right ventricular systolic function is normal. The right ventricular  size is normal. There is normal pulmonary artery systolic pressure. The  estimated right ventricular systolic pressure is 19.0 mmHg.   3. The mitral valve is degenerative. Mild mitral valve regurgitation. No  evidence of mitral stenosis.   4. The aortic valve is abnormal. Aortic valve regurgitation is severe.  Aortic valve area, by VTI measures 1.29 cm, Aortic valve mean gradient  measures 28.0 mmHg, Aortic valve Vmax measures 3.83 m/s, DVI is 0.41 but  visually the AV does not appear  stenotic. 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 Cannot get accurate waveforms to demonstrate LVOT  gradient compared to AV gradient. The AV   has been reported as moderate AS in the past but in the M Mode and  visually on 2D images the leaflets appear to open well with some AV  sclerosis. Suspect that increased AV gradient and peak velocities are due  in part to increased flow across AV from AI  as well as increased velocity in LVOT from severe BSH.   5. Aortic dilatation noted. Aneurysm of the ascending aorta, measuring 45  mm.   6. The inferior vena cava is normal in size with greater than 50%  respiratory variability, suggesting right  atrial pressure of 3 mmHg.   7. Recommend TEE to get planimetry of the AV for accurate measurement of  the AVA as well as assess severity of AI further given LV dilatation.   Echo 08/15/22: IMPRESSIONS     1. Severe AI is present. Gradients across AoV elevated (Vmax 3.7 m/s, MG  31 mmHG) but valve opens well. Suspect gradients are related to  significant AI. Would recommend TEE for clarification. LVIDd is largely  unchanged (49 mm vs 47 mm). The aortic valve   is tricuspid. Aortic valve regurgitation is severe. Mild to moderate  aortic valve stenosis. Aortic valve mean gradient measures 31.0 mmHg.  Aortic valve Vmax measures 3.75 m/s.   2. Left ventricular ejection fraction, by estimation, is 55 to 60%. The  left ventricle has normal function. The left ventricle has no regional  wall motion abnormalities. Left ventricular diastolic parameters are  consistent with Grade I diastolic  dysfunction (impaired relaxation).   3. Right ventricular systolic function is normal. The right ventricular  size is normal. There is normal pulmonary artery systolic pressure. The  estimated  right ventricular systolic pressure is 19.2 mmHg.   4. The mitral valve is grossly normal. Mild mitral valve regurgitation.  No evidence of mitral stenosis.   5. There is severe dilatation of the ascending aorta, measuring 43 mm.   6. The inferior vena cava is normal in size with greater than 50%  respiratory variability, suggesting right atrial pressure of 3 mmHg.   Comparison(s): No significant change from prior study.   CT ANGIOGRAPHY CHEST WITH CONTRAST   TECHNIQUE: Multidetector CT imaging of the chest was performed using the standard protocol during bolus administration of intravenous contrast. Multiplanar CT image reconstructions and MIPs were obtained to evaluate the vascular anatomy.   RADIATION DOSE REDUCTION: This exam was performed according to the departmental dose-optimization program which  includes automated exposure control, adjustment of the mA and/or kV according to patient size and/or use of iterative reconstruction technique.   CONTRAST:  75mL ISOVUE-370 IOPAMIDOL (ISOVUE-370) INJECTION 76%   COMPARISON:  March 12, 2022, September 27, 2022 trauma on March 30, 2021   FINDINGS: Cardiovascular: Cardiomegaly. Predominantly LEFT-sided coronary artery atherosclerotic calcifications. No significant pericardial effusion. Moderate atherosclerotic calcifications of the aorta. ReVisualization of an aneurysm of the ascending thoracic aorta. Ascending thoracic aorta measures approximately 4.5 by 4.6 cm although the evaluation is limited by motion. This is similar in comparison to prior. Sino-tubular junction measures approximately 3.6 cm. The level at the sinuses of Valsalva measure approximately 4 by 4.1 cm.   Mediastinum/Nodes: Visualized thyroid is unremarkable. No axillary adenopathy or mediastinal adenopathy.   Lungs/Pleura: No pleural effusion or pneumothorax. Scattered atelectasis. LEFT lower lobe pulmonary nodule measures 4 mm, stable dating back to July 2022 (series 9, image 277).   Upper Abdomen: Partial visualization of a dilated pancreatic duct and cyst of the LEFT liver. This is better assessed on recent MRI.   Musculoskeletal: Healing LEFT anterolateral rib fractures.   Review of the MIP images confirms the above findings.   IMPRESSION: 1. Similar aneurysmal dilatation of the ascending thoracic aorta measuring up to 4.6 cm. Recommend semi-annual imaging followup by CTA or MRA and referral to cardiothoracic surgery if not already obtained. This recommendation follows 2010 ACCF/AHA/AATS/ACR/ASA/SCA/SCAI/SIR/STS/SVM Guidelines for the Diagnosis and Management of Patients With Thoracic Aortic Disease. Circulation. 2010; 121: [id number]. Aortic aneurysm NOS (ICD10-I71.9) 2. Healing LEFT anterolateral rib fractures. 3. Partial visualization of a dilated pancreatic  duct and cyst of the LEFT liver. This is better assessed on recent MRI. 4. Stable 4 mm LEFT lower lobe pulmonary nodule dating back to July 2022. No follow-up needed if patient is low-risk.This recommendation follows the consensus statement: Guidelines for Management of Incidental Pulmonary Nodules Detected on CT Images: From the Fleischner Society 2017; Radiology 2017; 284:228-243.   Aortic Atherosclerosis (ICD10-I70.0).     Electronically Signed  ASSESSMENT:    1. Nonrheumatic aortic valve stenosis   2. Essential hypertension   3. Pre-procedure lab exam   4. Aneurysm of ascending aorta without rupture (HCC)        PLAN:  In order of problems listed above:  1. Hypertension: BP with adequate control on valsartan, amlodipine and Coreg.  Continue sodium restriction. Widened pulse pressure due to AI  2. Non-obstructive CAD: Cath in 2017 showed mild nonobstructive CAD (pLAD 20%, p-m RCA 10%). CT chest showed mild atherosclerosis, coronary artery calcification.   Continue Crestor.  3. Severe AI/aortic stenosis: Most recent recent echocardiogram in October showed EF 55-60%, with mild LV dilation.  Severe AI. She does have some exercise intolerance. I  think we are getting close to needing AVR. She does have exercise intolerance and reviewing serial Echos I think her AI is progressing. We discussed AVR and given her enlarge aorta would likely need open surgery with Aortic grafting and not TAVR. Discussed this all with her again. The next step would be to do right and left heart cath. Will schedule this for next week. The procedure and risks were reviewed including but not limited to death, myocardial infarction, stroke, arrythmias, bleeding, transfusion, emergency surgery, dye allergy, or renal dysfunction. The patient voices understanding and is agreeable to proceed.   4. Ascending aortic aneurysm: No change by CT in Feb. Follows with Dr Dorris Fetch. Continue optimal BP control. Recommend  avoidance of quinolone antibiotics.   5. PSVT/syncope: Outpatient monitor in 06/2021 in the setting of intermittent tachypalpitations showed PSVT, longest episode lasting 2 minutes 34 seconds.  no sustained arrhythmia. Continue Coreg.    6. Hyperlipidemia: LDL was 58.  Continue Crestor.   7. GI symptoms. Per Dr Meridee Score. I have no concerns about EGD if he decides. Cleared from a CV standpoint.     Medication Adjustments/Labs and Tests Ordered: Current medicines are reviewed at length with the patient today.  Concerns regarding medicines are outlined above.  Medication changes, Labs and Tests ordered today are listed in the Patient Instructions below. There are no Patient Instructions on file for this visit.    Signed, Vedanth Sirico [place], MD  09/25/2023 12:34 PM    [facility] [address], [place], [place], [address] [phone]

## 2023-09-21 NOTE — Progress Notes (Signed)
 Cardiology Office Note    Date:  09/25/2023   ID:  Ameyalli, Elicker 03/01/1946, MRN [id number]  PCP:  Melida Quitter, MD  Cardiologist:  Arham Symmonds [place], MD    History of Present Illness:  Erika Wu is a 78 y.o. female seen for follow up  of chronic AI.   She also has a history of aortic stenosis/AI. She had a normal Myoview study in July 2016. She also had an Echo at that time showing Aortic root enlargement with moderate  AI. This was stable from prior exams. Seen in October 2017 with symptoms of chest pain and palpitations. Echo reported severe AI with dilated aorta of 4.4 cm. EF and LV dimensions were normal. Myoview study was abnormal with evidence of apical ischemia. This led to a cardiac cath in November 2017 showing minimal nonobstructive CAD. Normal LV function and normal LVEDP. She also wore an event monitor that was normal with no arrhythmia.   Echo in March 2021 showed normal EF with moderate AI.   Echocardiogram obtained on 01/23/2021 showed EF 65 to 70%, grade 1 DD, mild LVH, moderate to severe AI with moderate aortic stenosis, borderline dilated ascending aorta measuring at 39 mm.  CTA of the chest obtained on 03/30/2021 showed a  thoracic aortic aneurysm measuring at 4.5 cm.   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     HIDA scan obtained on 05/22/2021 was normal.  She was seen in September 2022 with complaints of intermittent palpitations. She wore an event monitor which showed some minor NSVT. She did have some episodes of SVT longest lasting 3 minutes. Metoprolol dose was increased. When seen in November symptoms were improved.   Follow up CT in February showed stable aortic size 4.5 cm. Last Echo in October showed severe AI, mild AS. EF preserved. LV dimension OK.   She  is seen today with her daughter. In October she was visiting family in [place] when she felt ill with cough and no energy. Was seen in ED. CXR was clear. Ecg no change. Troponin normal. Chemistries and CBC Ok. Tested negative for flu and Covid. Since then she is better but still has a mild cough. Prior to this she has noted that when she walks she sometimes will give out more easily and have to stop. No swelling, orthopnea or PND.   She is seen with her daughter today. Still notes over past 6 months that she gives out more easily. Slight swelling in ankles. No chest pain. No SOB. Is having GI complaints of diarrhea, nausea. Seeing Dr Meridee Score. Considered for EGD.     Past Medical History:  Diagnosis Date   Allergy    Anxiety    Arthritis    Chest pain    a. 03/2015 Lexiscan MV: EF 59%, no ischemia/infarct.   Colon polyps    COVID-19 virus infection    09-22-2020, fully vax'd    Depression    GERD (gastroesophageal reflux disease)    Heart murmur    Hemorrhoids    Hyperlipidemia    Hypertension    Moderate aortic insufficiency    a. 03/2015 Echo: EF 55-60%, no rwma, mild LVH, Gr 1 DD, mild AS, mod AI (bicuspid AoV by report, poorly visualized on this study), Asc Ao diam 44mm, triv MR.  Past Surgical History:  Procedure Laterality Date   CARDIAC CATHETERIZATION N/A 07/24/2016   Procedure: Left Heart Cath and Coronary Angiography;  Surgeon: Saya Mccoll M [place], MD;  Location: [facility] INVASIVE CV LAB;  Service: Cardiovascular;  Laterality: N/A;   COLONOSCOPY     HAND DEBRIDEMENT  07/2022   right hand   NERVE, TENDON AND ARTERY REPAIR Right 08/07/2022   Procedure: NERVE, TENDON AND ARTERY REPAIR;  Surgeon: Betha Loa, MD;  Location: [facility] OR;  Service: Orthopedics;  Laterality: Right;   POLYPECTOMY     TONSILLECTOMY AND ADENOIDECTOMY     TUBAL LIGATION     WOUND EXPLORATION Right 08/07/2022   Procedure: WOUND EXPLORATION RIGHT WRIST;  Surgeon: Betha Loa, MD;  Location: [facility] OR;  Service:  Orthopedics;  Laterality: Right;  60 MIN    Current Medications: Outpatient Medications Prior to Visit  Medication Sig Dispense Refill   amLODipine (NORVASC) 5 MG tablet TAKE 1 TABLET(5 MG) BY MOUTH DAILY 90 tablet 3   buPROPion (WELLBUTRIN XL) 150 MG 24 hr tablet Take 450 mg by mouth every morning.  3   carvedilol (COREG) 25 MG tablet TAKE 1 TABLET(25 MG) BY MOUTH TWICE DAILY 180 tablet 3   mirtazapine (REMERON) 30 MG tablet Take 30 mg by mouth at bedtime.     pantoprazole (PROTONIX) 40 MG tablet TAKE 1 TABLET(40 MG) BY MOUTH TWICE DAILY BEFORE A MEAL 180 tablet 3   rosuvastatin (CRESTOR) 10 MG tablet Take 1 tablet by mouth daily.     Suvorexant (BELSOMRA) 20 MG TABS Take 20 mg by mouth daily.     valsartan (DIOVAN) 80 MG tablet Take 1 tablet (80 mg total) by mouth 2 (two) times daily. 180 tablet 3   hydrALAZINE (APRESOLINE) 10 MG tablet Take 1 tablet (10 mg total) by mouth as needed (as needed for systolic Blood pressures greater than 170). 30 tablet 1   Cholestyramine POWD 1 g by Does not apply route daily. (Patient not taking: Reported on 09/25/2023) 100 g 0   rifaximin (XIFAXAN) 550 MG TABS tablet Take 1 tablet (550 mg total) by mouth 2 (two) times daily. (Patient not taking: Reported on 09/25/2023) 28 tablet 0   No facility-administered medications prior to visit.     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  Tobacco Use   Smoking status: Former    Current packs/day: 0.50    Average packs/day: 0.5 packs/day for 15.0 years (7.5 ttl pk-yrs)    Types: Cigarettes   Smokeless tobacco: Never  Vaping Use   Vaping status: Never Used  Substance and Sexual Activity   Alcohol use: Yes     Alcohol/week: 0.0 standard drinks of alcohol    Comment: 2-3 per day   Drug use: No   Sexual activity: Not Currently  Other Topics Concern   Not on file  Social History Narrative   Not on file   Social Drivers of Health   Financial Resource Strain: Not on file  Food Insecurity: Not on file  Transportation Needs: Not on file  Physical Activity: Not on file  Stress: Not on file  Social Connections: Unknown (07/06/2023)  Received from [employer]   Social Network    Social Network: Not on file     Family History:  The patient's family history includes Colon cancer in her maternal grandmother; Diverticulosis in her maternal grandfather; HIV in her brother; Heart attack in her father; Heart disease in her father; Stomach cancer in her maternal grandmother; Stroke in her paternal grandfather.   ROS:   Please see the history of present illness.    ROS All other systems reviewed and are negative.   PHYSICAL EXAM:   VS:  BP 124/60   Pulse (!) 58   Ht 5\' 2"  (1.575 m)   Wt 121 lb (54.9 kg)   SpO2 97%   BMI 22.13 kg/m    GEN: Well nourished, well developed, in no acute distress  HEENT: normal  Neck: no JVD, carotid bruits, or masses Cardiac: RRR; there is a gr 2/6 systolic ejection murmur RUSB with a blowing 3/6 diastolic murmur, no edema  Respiratory:  clear to auscultation bilaterally, normal work of breathing GI: soft, nontender, nondistended, + BS MS: no deformity or atrophy  Skin: warm and dry, no rash Neuro:  Alert and Oriented x 3, Strength and sensation are intact Psych: euthymic mood, full affect  Wt Readings from Last 3 Encounters:  09/25/23 121 lb (54.9 kg)  08/02/23 122 lb 4 oz (55.5 kg)  07/09/23 121 lb (54.9 kg)      Studies/Labs Reviewed:   EKG Interpretation Date/Time:  Wednesday September 25 2023 12:01:38 EST Ventricular Rate:  58 PR Interval:  198 QRS Duration:  112 QT Interval:  440 QTC Calculation: 431 R Axis:   18  Text Interpretation: Sinus  bradycardia with Fusion complexes Minimal voltage criteria for LVH, may be normal variant ( Cornell product ) Inferior infarct , age undetermined When compared with ECG of Jan 10, 2023 Fusion complexes are now Present Confirmed by [place], Reyes Fifield [phone]) on 09/25/2023 12:05:53 PM    Recent Labs: No results found for requested labs within last 365 days.   Lipid Panel    Component Value Date/Time   CHOL 190 07/19/2016 1050   TRIG 77 07/19/2016 1050   HDL 86 07/19/2016 1050   CHOLHDL 2.2 07/19/2016 1050   VLDL 15 07/19/2016 1050   LDLCALC 89 07/19/2016 1050  Dated 04/14/20: normal CBC Dated 11/14/20: cholesterol 198, triglycerides 80, HDL 77, LDL 105. CMET and TSH normal Dated 11/16/21: cholesterol 142, triglycerides 61, HDL 60, LDL 70. CMET and TSH normal Dated 09/19/22: normal CBC.  Dated 12/25/22: cholesterol 144, triglycerides 66, HDL 73, LDL 58, CMET and TSH normal.  Dated 07/09/23: CMET and CBC normal.    Additional studies/ records that were reviewed today include:  Myoview 07/13/16:Study Highlights   Nuclear stress EF: 60%. The left ventricular ejection fraction is normal (55-65%). There was no ST segment deviation noted during stress. There is a small defect of mild severity present in the apical lateral and apical location. The defect is reversible and is consistent with a small area of ischemia although shifting breast attenuation artifact. is also a possiblity. This is an intermediate risk study due to apical reversible defect.   Echo: 07/23/16:Study Conclusions   - Left ventricle: The cavity size was normal. There was mild focal   basal hypertrophy of the septum. Systolic function was normal.   The estimated ejection fraction was in the range of 60% to 65%.   Wall motion was normal; there were no regional wall motion   abnormalities. - Aortic  valve: Probably trileaflet; moderately thickened,   moderately calcified leaflets. Transvalvular velocity was   minimally increased. There  was no stenosis. There was severe   regurgitation. - Aorta: Ascending aortic diameter: 44 mm (S). - Aortic root: The aortic root was moderately dilated. - Mitral valve: There was mild regurgitation.  Cardiac cath 07/24/16:Conclusion     The left ventricular systolic function is normal. LV end diastolic pressure is normal. The left ventricular ejection fraction is 55-65% by visual estimate. Prox LAD lesion, 20 %stenosed. Prox RCA to Mid RCA lesion, 10 %stenosed.   1. Mild nonobstructive CAD 2. Normal LV function 3. Normal LVEDP   Plan: medical management.   Event monitor November 2017: normal   Echo 11/17/19: IMPRESSIONS     1. Left ventricular ejection fraction, by estimation, is 60 to 65%. The  left ventricle has normal function. The left ventricle has no regional  wall motion abnormalities. There is severe asymmetric left ventricular  hypertrophy of the basal-septal  segment. Left ventricular diastolic parameters are indeterminate.   2. Right ventricular systolic function is normal. The right ventricular  size is normal. Tricuspid regurgitation signal is inadequate for assessing  PA pressure.   3. The mitral valve is normal in structure. Trivial mitral valve  regurgitation.   4. The aortic valve was not well visualized. Aortic valve regurgitation  is moderate. No aortic stenosis is present.   5. Aortic dilatation noted. There is dilatation of the ascending aorta  measuring 44 mm.   6. The inferior vena cava is normal in size with greater than 50%  respiratory variability, suggesting right atrial pressure of 3 mmHg.   Cardiac monitor 06/2021: Normal sinus rhythm 6 episode of nonsustained VT 4-7 beats Episodes of SVT- appear reentrant with sudden onset and termination. longest lasting 2 minutes and 54 seconds at rate 156 bpm     Patch Wear Time:  14 days and 0 hours (2022-09-29T15:04:27-0400 to 2022-10-13T15:04:27-0400)   Patient had a min HR of 50 bpm, max HR of 188  bpm, and avg HR of 69 bpm. Predominant underlying rhythm was Sinus Rhythm. 6 Ventricular Tachycardia runs occurred, the run with the fastest interval lasting 4 beats with a max rate of 188 bpm, the longest  lasting 7 beats with an avg rate of 128 bpm. 18 Supraventricular Tachycardia runs occurred, the run with the fastest interval lasting 2 mins 54 secs with a max rate of 156 bpm (avg 143 bpm); the run with the fastest interval was also the longest.  Supraventricular Tachycardia was detected within +/- 45 seconds of symptomatic patient event(s). Isolated SVEs were rare (<1.0%), SVE Couplets were rare (<1.0%), and SVE Triplets were rare (<1.0%). Isolated VEs were rare (<1.0%, 2958), VE Couplets were  rare (<1.0%, 115), and VE Triplets were rare (<1.0%, 7).      Echocardiogram 01/2021: 1. Left ventricular ejection fraction, by estimation, is 65 to 70%. The  left ventricle has normal function. The left ventricle has no regional  wall motion abnormalities. There is mild left ventricular hypertrophy.  Left ventricular diastolic parameters  are consistent with Grade I diastolic dysfunction (impaired relaxation).   2. Right ventricular systolic function is normal. The right ventricular  size is normal.   3. The mitral valve is normal in structure. No evidence of mitral valve  regurgitation. No evidence of mitral stenosis.   4. The aortic valve is normal in structure. Aortic valve regurgitation is  severe. Moderate aortic valve stenosis. Aortic regurgitation PHT measures  415  msec. Aortic valve mean gradient measures 18.0 mmHg. Aortic valve Vmax  measures 3.04 m/s.   5. There is borderline dilatation of the ascending aorta, measuring 39  mm.   6. The inferior vena cava is normal in size with greater than 50%  respiratory variability, suggesting right atrial pressure of 3 mmHg.   Echo 08/01/21: IMPRESSIONS     1. Left ventricular ejection fraction, by estimation, is 60 to 65%. Left  ventricular  ejection fraction by 3D volume is 65 %. The left ventricle has  normal function. The left ventricle has no regional wall motion  abnormalities. There is moderate left  ventricular hypertrophy. Left ventricular diastolic parameters are  consistent with Grade I diastolic dysfunction (impaired relaxation).   2. Right ventricular systolic function is normal. The right ventricular  size is normal. Tricuspid regurgitation signal is inadequate for assessing  PA pressure.   3. Left atrial size was severely dilated.   4. The pericardial effusion is anterior to the right ventricle.   5. The mitral valve is degenerative. Mild mitral valve regurgitation. No  evidence of mitral stenosis.   6. The aortic valve is calcified. Aortic valve regurgitation is moderate  to severe. Moderate aortic valve stenosis. Aortic regurgitation PHT  measures 492 msec. Aortic valve area, by VTI measures 1.00 cm. Aortic  valve mean gradient measures 19.0 mmHg.  Aortic valve Vmax measures 3.01 m/s.   7. Aortic dilatation noted. There is mild dilatation of the aortic root,  measuring 39 mm.   8. The inferior vena cava is normal in size with greater than 50%  respiratory variability, suggesting right atrial pressure of 3 mmHg.   Comparison(s): EF 65%, moderate AS with severe AI, mean gradient 18 mmHg,  peak 36.70mmHg, Ascend Aor 39mm, AI PHT 415 msec, mild MAC. Compared to  study dated 01/23/2021, The mean AVG has increased from 18 to .  Visually AI appears moderate to severe  but only mild by PHT. Echo images are suboptimol. Consider TEE of the AV  if clinically indicated.     EXAM: CT ANGIOGRAPHY CHEST WITH CONTRAST   TECHNIQUE: Multidetector CT imaging of the chest was performed using the standard protocol during bolus administration of intravenous contrast. Multiplanar CT image reconstructions and MIPs were obtained to evaluate the vascular anatomy.   RADIATION DOSE REDUCTION: This exam was performed  according to the departmental dose-optimization program which includes automated exposure control, adjustment of the mA and/or kV according to patient size and/or use of iterative reconstruction technique.   CONTRAST:  75mL OMNIPAQUE IOHEXOL 350 MG/ML SOLN   COMPARISON:  CT angiography chest 09/26/2021   FINDINGS: Cardiovascular: Preferential opacification of the pulmonary artery. Stable aneurysmal ascending thoracic aorta measuring up to 4.5 cm in caliber. The descending thoracic aorta is normal in caliber. No dissection. Mild atherosclerotic plaque of the thoracic aorta. At least 2 vessel coronary artery calcifications. Normal heart size. No significant pericardial effusion. The main pulmonary artery is normal in caliber. No pulmonary embolus.   Mediastinum/Nodes: No enlarged mediastinal, hilar, or axillary lymph nodes. Thyroid gland, trachea, and esophagus demonstrate no significant findings.   Lungs/Pleura: Bilateral lower lobe linear atelectasis versus scarring. No focal consolidation. No pulmonary nodule. No pulmonary mass. No pleural effusion. No pneumothorax.   Upper Abdomen: No acute abnormality.   Musculoskeletal:   No chest wall abnormality.   No suspicious lytic or blastic osseous lesions. No acute displaced fracture. Multilevel degenerative changes of the spine.   Review of the MIP images confirms  the above findings.   IMPRESSION: 1. Stable aneurysmal ascending thoracic aorta (4.5 cm). Ascending thoracic aortic aneurysm. Recommend semi-annual imaging followup by CTA or MRA and referral to cardiothoracic surgery if not already obtained. This recommendation follows 2010 ACCF/AHA/AATS/ACR/ASA/SCA/SCAI/SIR/STS/SVM Guidelines for the Diagnosis and Management of Patients With Thoracic Aortic Disease. Circulation. 2010; 121: Z610-R604. Aortic aneurysm NOS (ICD10-I71.9). 2. Aortic Atherosclerosis (ICD10-I70.0) with at least 2 vessel coronary calcifications. 3. No  acute intrapulmonary abnormality.     Electronically Signed   By: Tish Frederickson M.D.   On: 03/12/2022 15:18  Echo 03/07/22: IMPRESSIONS     1. Left ventricular ejection fraction, by estimation, is 60 to 65%. The  left ventricle has normal function. The left ventricle has no regional  wall motion abnormalities. The left ventricular internal cavity size was  mildly dilated. There is severe  concentric left ventricular hypertrophy of the basal-septal segment. Left  ventricular diastolic parameters are consistent with Grade I diastolic  dysfunction (impaired relaxation).   2. Right ventricular systolic function is normal. The right ventricular  size is normal. There is normal pulmonary artery systolic pressure. The  estimated right ventricular systolic pressure is 19.0 mmHg.   3. The mitral valve is degenerative. Mild mitral valve regurgitation. No  evidence of mitral stenosis.   4. The aortic valve is abnormal. Aortic valve regurgitation is severe.  Aortic valve area, by VTI measures 1.29 cm, Aortic valve mean gradient  measures 28.0 mmHg, Aortic valve Vmax measures 3.83 m/s, DVI is 0.41 but  visually the AV does not appear  stenotic. 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 Cannot get accurate waveforms to demonstrate LVOT  gradient compared to AV gradient. The AV   has been reported as moderate AS in the past but in the M Mode and  visually on 2D images the leaflets appear to open well with some AV  sclerosis. Suspect that increased AV gradient and peak velocities are due  in part to increased flow across AV from AI  as well as increased velocity in LVOT from severe BSH.   5. Aortic dilatation noted. Aneurysm of the ascending aorta, measuring 45  mm.   6. The inferior vena cava is normal in size with greater than 50%  respiratory variability, suggesting right  atrial pressure of 3 mmHg.   7. Recommend TEE to get planimetry of the AV for accurate measurement of  the AVA as well as assess severity of AI further given LV dilatation.   Echo 08/15/22: IMPRESSIONS     1. Severe AI is present. Gradients across AoV elevated (Vmax 3.7 m/s, MG  31 mmHG) but valve opens well. Suspect gradients are related to  significant AI. Would recommend TEE for clarification. LVIDd is largely  unchanged (49 mm vs 47 mm). The aortic valve   is tricuspid. Aortic valve regurgitation is severe. Mild to moderate  aortic valve stenosis. Aortic valve mean gradient measures 31.0 mmHg.  Aortic valve Vmax measures 3.75 m/s.   2. Left ventricular ejection fraction, by estimation, is 55 to 60%. The  left ventricle has normal function. The left ventricle has no regional  wall motion abnormalities. Left ventricular diastolic parameters are  consistent with Grade I diastolic  dysfunction (impaired relaxation).   3. Right ventricular systolic function is normal. The right ventricular  size is normal. There is normal pulmonary artery systolic pressure. The  estimated  right ventricular systolic pressure is 19.2 mmHg.   4. The mitral valve is grossly normal. Mild mitral valve regurgitation.  No evidence of mitral stenosis.   5. There is severe dilatation of the ascending aorta, measuring 43 mm.   6. The inferior vena cava is normal in size with greater than 50%  respiratory variability, suggesting right atrial pressure of 3 mmHg.   Comparison(s): No significant change from prior study.   CT ANGIOGRAPHY CHEST WITH CONTRAST   TECHNIQUE: Multidetector CT imaging of the chest was performed using the standard protocol during bolus administration of intravenous contrast. Multiplanar CT image reconstructions and MIPs were obtained to evaluate the vascular anatomy.   RADIATION DOSE REDUCTION: This exam was performed according to the departmental dose-optimization program which  includes automated exposure control, adjustment of the mA and/or kV according to patient size and/or use of iterative reconstruction technique.   CONTRAST:  75mL ISOVUE-370 IOPAMIDOL (ISOVUE-370) INJECTION 76%   COMPARISON:  March 12, 2022, September 27, 2022 trauma on March 30, 2021   FINDINGS: Cardiovascular: Cardiomegaly. Predominantly LEFT-sided coronary artery atherosclerotic calcifications. No significant pericardial effusion. Moderate atherosclerotic calcifications of the aorta. ReVisualization of an aneurysm of the ascending thoracic aorta. Ascending thoracic aorta measures approximately 4.5 by 4.6 cm although the evaluation is limited by motion. This is similar in comparison to prior. Sino-tubular junction measures approximately 3.6 cm. The level at the sinuses of Valsalva measure approximately 4 by 4.1 cm.   Mediastinum/Nodes: Visualized thyroid is unremarkable. No axillary adenopathy or mediastinal adenopathy.   Lungs/Pleura: No pleural effusion or pneumothorax. Scattered atelectasis. LEFT lower lobe pulmonary nodule measures 4 mm, stable dating back to July 2022 (series 9, image 277).   Upper Abdomen: Partial visualization of a dilated pancreatic duct and cyst of the LEFT liver. This is better assessed on recent MRI.   Musculoskeletal: Healing LEFT anterolateral rib fractures.   Review of the MIP images confirms the above findings.   IMPRESSION: 1. Similar aneurysmal dilatation of the ascending thoracic aorta measuring up to 4.6 cm. Recommend semi-annual imaging followup by CTA or MRA and referral to cardiothoracic surgery if not already obtained. This recommendation follows 2010 ACCF/AHA/AATS/ACR/ASA/SCA/SCAI/SIR/STS/SVM Guidelines for the Diagnosis and Management of Patients With Thoracic Aortic Disease. Circulation. 2010; 121: [id number]. Aortic aneurysm NOS (ICD10-I71.9) 2. Healing LEFT anterolateral rib fractures. 3. Partial visualization of a dilated pancreatic  duct and cyst of the LEFT liver. This is better assessed on recent MRI. 4. Stable 4 mm LEFT lower lobe pulmonary nodule dating back to July 2022. No follow-up needed if patient is low-risk.This recommendation follows the consensus statement: Guidelines for Management of Incidental Pulmonary Nodules Detected on CT Images: From the Fleischner Society 2017; Radiology 2017; 284:228-243.   Aortic Atherosclerosis (ICD10-I70.0).     Electronically Signed  ASSESSMENT:    1. Nonrheumatic aortic valve stenosis   2. Essential hypertension   3. Pre-procedure lab exam   4. Aneurysm of ascending aorta without rupture (HCC)        PLAN:  In order of problems listed above:  1. Hypertension: BP with adequate control on valsartan, amlodipine and Coreg.  Continue sodium restriction. Widened pulse pressure due to AI  2. Non-obstructive CAD: Cath in 2017 showed mild nonobstructive CAD (pLAD 20%, p-m RCA 10%). CT chest showed mild atherosclerosis, coronary artery calcification.   Continue Crestor.  3. Severe AI/aortic stenosis: Most recent recent echocardiogram in October showed EF 55-60%, with mild LV dilation.  Severe AI. She does have some exercise intolerance. I  think we are getting close to needing AVR. She does have exercise intolerance and reviewing serial Echos I think her AI is progressing. We discussed AVR and given her enlarge aorta would likely need open surgery with Aortic grafting and not TAVR. Discussed this all with her again. The next step would be to do right and left heart cath. Will schedule this for next week. The procedure and risks were reviewed including but not limited to death, myocardial infarction, stroke, arrythmias, bleeding, transfusion, emergency surgery, dye allergy, or renal dysfunction. The patient voices understanding and is agreeable to proceed.   4. Ascending aortic aneurysm: No change by CT in Feb. Follows with Dr Dorris Fetch. Continue optimal BP control. Recommend  avoidance of quinolone antibiotics.   5. PSVT/syncope: Outpatient monitor in 06/2021 in the setting of intermittent tachypalpitations showed PSVT, longest episode lasting 2 minutes 34 seconds.  no sustained arrhythmia. Continue Coreg.    6. Hyperlipidemia: LDL was 58.  Continue Crestor.   7. GI symptoms. Per Dr Meridee Score. I have no concerns about EGD if he decides. Cleared from a CV standpoint.     Medication Adjustments/Labs and Tests Ordered: Current medicines are reviewed at length with the patient today.  Concerns regarding medicines are outlined above.  Medication changes, Labs and Tests ordered today are listed in the Patient Instructions below. There are no Patient Instructions on file for this visit.    Signed, Vedanth Sirico Swaziland, MD  09/25/2023 12:34 PM    [facility] [address], [place], [place], [address] [phone]

## 2023-09-25 ENCOUNTER — Ambulatory Visit: Payer: Medicare PPO | Attending: Cardiology | Admitting: Cardiology

## 2023-09-25 ENCOUNTER — Encounter: Payer: Self-pay | Admitting: Cardiology

## 2023-09-25 ENCOUNTER — Other Ambulatory Visit: Payer: Self-pay | Admitting: Cardiology

## 2023-09-25 VITALS — BP 124/60 | HR 58 | Ht 62.0 in | Wt 121.0 lb

## 2023-09-25 DIAGNOSIS — I10 Essential (primary) hypertension: Secondary | ICD-10-CM | POA: Diagnosis not present

## 2023-09-25 DIAGNOSIS — I351 Nonrheumatic aortic (valve) insufficiency: Secondary | ICD-10-CM

## 2023-09-25 DIAGNOSIS — I7121 Aneurysm of the ascending aorta, without rupture: Secondary | ICD-10-CM | POA: Diagnosis not present

## 2023-09-25 DIAGNOSIS — I350 Nonrheumatic aortic (valve) stenosis: Secondary | ICD-10-CM

## 2023-09-25 DIAGNOSIS — Z01812 Encounter for preprocedural laboratory examination: Secondary | ICD-10-CM

## 2023-09-25 NOTE — Patient Instructions (Signed)
 Medication Instructions:  Continue same medications *If you need a refill on your cardiac medications before your next appointment, please call your pharmacy*   Lab Work: Bmet,cbc today   Testing/Procedures: Cardiac Cath scheduled at [facility] Thurs 1/23  Follow instructions below   Follow-Up: At [facility], you and your health needs are our priority.  As part of our continuing mission to provide you with exceptional heart care, we have created designated Provider Care Teams.  These Care Teams include your primary Cardiologist (physician) and Advanced Practice Providers (APPs -  Physician Assistants and Nurse Practitioners) who all work together to provide you with the care you need, when you need it.  We recommend signing up for the patient portal called "MyChart".  Sign up information is provided on this After Visit Summary.  MyChart is used to connect with patients for Virtual Visits (Telemedicine).  Patients are able to view lab/test results, encounter notes, upcoming appointments, etc.  Non-urgent messages can be sent to your provider as well.   To learn more about what you can do with MyChart, go to [email or web address].    Your next appointment:  After Cath    Provider:  Dr.Jordan             [facility] A DEPT OF [facility] AT [address] [place] [address] Dept: [phone] Loc: [phone]  Erika Wu  09/25/2023  You are scheduled for a Cardiac Catheterization on Thursday, January 23 with Dr. Peter Swaziland.  1. Please arrive at the [facility] (Main Entrance A) at [facility]: [address] [place], [place] [address] at 7:00 AM (This time is 2 hour(s) before your procedure to ensure your preparation).   Free valet parking service is available. You will check in at ADMITTING. The support person will be asked to wait in the waiting room.  It is OK to  have someone drop you off and come back when you are ready to be discharged.    Special note: Every effort is made to have your procedure done on time. Please understand that emergencies sometimes delay scheduled procedures.  2. Diet: Do not eat solid foods after midnight.  The patient may have clear liquids until 5am upon the day of the procedure.  3. Labs: You will need to have blood drawn on today 1/15 at Dr.Jordan's office. You do not need to be fasting.  4. Medication instructions in preparation for your procedure:        On the morning of your procedure, take your Aspirin  81 mg. and any morning medicines NOT listed above.  You may use sips of water .  5. Plan to go home the same day, you will only stay overnight if medically necessary. 6. Bring a current list of your medications and current insurance cards. 7. You MUST have a responsible person to drive you home. 8. Someone MUST be with you the first 24 hours after you arrive home or your discharge will be delayed. 9. Please wear clothes that are easy to get on and off and wear slip-on shoes.  Thank you for allowing us  to care for you!   -- [facility]

## 2023-09-25 NOTE — Telephone Encounter (Signed)
 Patient daughter requesting f/u call in regurds to previous note. Please advise.

## 2023-09-26 LAB — BASIC METABOLIC PANEL
BUN/Creatinine Ratio: 25 (ref 12–28)
BUN: 16 mg/dL (ref 8–27)
CO2: 16 mmol/L — ABNORMAL LOW (ref 20–29)
Calcium: 9.3 mg/dL (ref 8.7–10.3)
Chloride: 108 mmol/L — ABNORMAL HIGH (ref 96–106)
Creatinine, Ser: 0.65 mg/dL (ref 0.57–1.00)
Glucose: 89 mg/dL (ref 70–99)
Potassium: 4.6 mmol/L (ref 3.5–5.2)
Sodium: 143 mmol/L (ref 134–144)
eGFR: 91 mL/min/{1.73_m2} (ref >59)

## 2023-09-26 LAB — CBC WITH DIFFERENTIAL/PLATELET
Basophils Absolute: 0 10*3/uL (ref 0.0–0.2)
Basos: 1 %
EOS (ABSOLUTE): 0.2 10*3/uL (ref 0.0–0.4)
Eos: 2 %
Hematocrit: 41.2 % (ref 34.0–46.6)
Hemoglobin: 13.4 g/dL (ref 11.1–15.9)
Immature Grans (Abs): 0 10*3/uL (ref 0.0–0.1)
Immature Granulocytes: 0 %
Lymphocytes Absolute: 1.5 10*3/uL (ref 0.7–3.1)
Lymphs: 23 %
MCH: 31.7 pg (ref 26.6–33.0)
MCHC: 32.5 g/dL (ref 31.5–35.7)
MCV: 97 fL (ref 79–97)
Monocytes Absolute: 0.5 10*3/uL (ref 0.1–0.9)
Monocytes: 8 %
Neutrophils Absolute: 4.2 10*3/uL (ref 1.4–7.0)
Neutrophils: 66 %
Platelets: 208 10*3/uL (ref 150–450)
RBC: 4.23 x10E6/uL (ref 3.77–5.28)
RDW: 11.9 % (ref 11.7–15.4)
WBC: 6.4 10*3/uL (ref 3.4–10.8)

## 2023-09-26 NOTE — Telephone Encounter (Signed)
Left message on machine to call back  

## 2023-09-27 NOTE — Telephone Encounter (Signed)
Left message on machine to call back  Pt cleared from cardiology standpoint however, she has a cath set up for 10/05/23

## 2023-09-30 ENCOUNTER — Encounter (HOSPITAL_COMMUNITY): Payer: Self-pay | Admitting: Physician Assistant

## 2023-09-30 NOTE — Telephone Encounter (Signed)
Spoke with the pt daughter and advised that the pt should complete the cath that is scheduled for 1/25 in order for anesthesia to clear the pt for endoscopic procedure.

## 2023-09-30 NOTE — Telephone Encounter (Signed)
Left message on machine to call back  

## 2023-09-30 NOTE — Telephone Encounter (Signed)
Patient daughter returning call. Please advise

## 2023-10-02 ENCOUNTER — Telehealth: Payer: Self-pay | Admitting: Cardiology

## 2023-10-02 ENCOUNTER — Telehealth: Payer: Self-pay | Admitting: *Deleted

## 2023-10-02 NOTE — Telephone Encounter (Signed)
Spoke with pt regarding her right and left heart cath being done tomorrow. Pt would like to know if she will be receiving anesthesia or if this procedure will be similar to last time when she was able to be awake for her procedure. Advised pt that light moderate sedation would be used to relax pt but that she should be able to be awake for her procedure. Informed pt that we also received a call from her daughter. Pt states her daughter is currently with her and she will update her. Pt verbalizes understanding.

## 2023-10-02 NOTE — Telephone Encounter (Signed)
Patient wants a call back directly from RN Cheryl. 

## 2023-10-02 NOTE — Telephone Encounter (Signed)
Cardiac Catheterization scheduled at [facility] for: Thursday October 03, 2023 9 AM Arrival time [facility] Main Entrance A at: 7 AM  Nothing to eat after midnight prior to procedure, clear liquids until 5 AM day of procedure.  Medication instructions: -Usual morning medications can be taken with sips of water including aspirin 81 mg.  Plan to go home the same day, you will only stay overnight if medically necessary.  You must have responsible adult to drive you home.  Someone must be with you the first 24 hours after you arrive home.  Left detailed message with procedure instructions on voicemail (DPR), call if questions

## 2023-10-02 NOTE — Telephone Encounter (Signed)
Left message for daughter, [place] to call back to discuss heart cath.

## 2023-10-02 NOTE — Telephone Encounter (Signed)
Pt daughter called in asking if pt will have anesthesia with cath tomorrow. Please advise.

## 2023-10-02 NOTE — Telephone Encounter (Signed)
Please see alternate 1/22 Telephone encounter for documentation

## 2023-10-03 ENCOUNTER — Other Ambulatory Visit: Payer: Self-pay | Admitting: Thoracic Surgery (Cardiothoracic Vascular Surgery)

## 2023-10-03 ENCOUNTER — Encounter (HOSPITAL_COMMUNITY)
Admission: RE | Disposition: A | Discharge status: Home / Self Care | Payer: Self-pay | Source: Home / Self Care | Attending: Cardiology

## 2023-10-03 ENCOUNTER — Encounter: Payer: Self-pay | Admitting: Thoracic Surgery (Cardiothoracic Vascular Surgery)

## 2023-10-03 ENCOUNTER — Ambulatory Visit (HOSPITAL_COMMUNITY)
Admission: RE | Admit: 2023-10-03 | Discharge: 2023-10-03 | Disposition: A | Discharge status: Home / Self Care | Payer: Medicare PPO | Attending: Cardiology | Admitting: Cardiology

## 2023-10-03 ENCOUNTER — Other Ambulatory Visit: Payer: Self-pay

## 2023-10-03 DIAGNOSIS — I10 Essential (primary) hypertension: Secondary | ICD-10-CM | POA: Diagnosis not present

## 2023-10-03 DIAGNOSIS — I2510 Atherosclerotic heart disease of native coronary artery without angina pectoris: Secondary | ICD-10-CM | POA: Insufficient documentation

## 2023-10-03 DIAGNOSIS — E785 Hyperlipidemia, unspecified: Secondary | ICD-10-CM | POA: Insufficient documentation

## 2023-10-03 DIAGNOSIS — Z79899 Other long term (current) drug therapy: Secondary | ICD-10-CM | POA: Diagnosis not present

## 2023-10-03 DIAGNOSIS — Z87891 Personal history of nicotine dependence: Secondary | ICD-10-CM | POA: Insufficient documentation

## 2023-10-03 DIAGNOSIS — I7121 Aneurysm of the ascending aorta, without rupture: Secondary | ICD-10-CM | POA: Diagnosis not present

## 2023-10-03 DIAGNOSIS — I351 Nonrheumatic aortic (valve) insufficiency: Secondary | ICD-10-CM | POA: Insufficient documentation

## 2023-10-03 DIAGNOSIS — I350 Nonrheumatic aortic (valve) stenosis: Secondary | ICD-10-CM

## 2023-10-03 HISTORY — PX: THORACIC AORTOGRAM: CATH118269

## 2023-10-03 HISTORY — PX: RIGHT HEART CATH AND CORONARY ANGIOGRAPHY: CATH118264

## 2023-10-03 LAB — POCT I-STAT 7, (LYTES, BLD GAS, ICA,H+H)
Acid-base deficit: 4 mmol/L — ABNORMAL HIGH (ref 0.0–2.0)
Bicarbonate: 20.5 mmol/L (ref 20.0–28.0)
Calcium, Ion: 1.13 mmol/L — ABNORMAL LOW (ref 1.15–1.40)
HCT: 32 % — ABNORMAL LOW (ref 36.0–46.0)
Hemoglobin: 10.9 g/dL — ABNORMAL LOW (ref 12.0–15.0)
O2 Saturation: 92 %
Potassium: 3.7 mmol/L (ref 3.5–5.1)
Sodium: 144 mmol/L (ref 135–145)
TCO2: 22 mmol/L (ref 22–32)
pCO2 arterial: 35.8 mm[Hg] (ref 32–48)
pH, Arterial: 7.367 (ref 7.35–7.45)
pO2, Arterial: 64 mm[Hg] — ABNORMAL LOW (ref 83–108)

## 2023-10-03 LAB — POCT I-STAT EG7
Acid-base deficit: 4 mmol/L — ABNORMAL HIGH (ref 0.0–2.0)
Acid-base deficit: 4 mmol/L — ABNORMAL HIGH (ref 0.0–2.0)
Bicarbonate: 21.6 mmol/L (ref 20.0–28.0)
Bicarbonate: 21.6 mmol/L (ref 20.0–28.0)
Calcium, Ion: 1.17 mmol/L (ref 1.15–1.40)
Calcium, Ion: 1.21 mmol/L (ref 1.15–1.40)
HCT: 33 % — ABNORMAL LOW (ref 36.0–46.0)
HCT: 33 % — ABNORMAL LOW (ref 36.0–46.0)
Hemoglobin: 11.2 g/dL — ABNORMAL LOW (ref 12.0–15.0)
Hemoglobin: 11.2 g/dL — ABNORMAL LOW (ref 12.0–15.0)
O2 Saturation: 57 %
O2 Saturation: 63 %
Potassium: 3.7 mmol/L (ref 3.5–5.1)
Potassium: 3.8 mmol/L (ref 3.5–5.1)
Sodium: 142 mmol/L (ref 135–145)
Sodium: 142 mmol/L (ref 135–145)
TCO2: 23 mmol/L (ref 22–32)
TCO2: 23 mmol/L (ref 22–32)
pCO2, Ven: 39.5 mm[Hg] — ABNORMAL LOW (ref 44–60)
pCO2, Ven: 39.6 mm[Hg] — ABNORMAL LOW (ref 44–60)
pH, Ven: 7.344 (ref 7.25–7.43)
pH, Ven: 7.345 (ref 7.25–7.43)
pO2, Ven: 32 mm[Hg] (ref 32–45)
pO2, Ven: 34 mm[Hg] (ref 32–45)

## 2023-10-03 SURGERY — THORACIC AORTOGRAM
Anesthesia: LOCAL

## 2023-10-03 MED ORDER — VERAPAMIL HCL 2.5 MG/ML IV SOLN
INTRAVENOUS | Status: DC | PRN
  Administered 2023-10-03: 10 mL via INTRA_ARTERIAL

## 2023-10-03 MED ORDER — HEPARIN (PORCINE) IN NACL 1000-0.9 UT/500ML-% IV SOLN
INTRAVENOUS | Status: DC | PRN
  Administered 2023-10-03 (×2): 500 mL via INTRA_ARTERIAL

## 2023-10-03 MED ORDER — HEPARIN SODIUM (PORCINE) 1000 UNIT/ML IJ SOLN
INTRAMUSCULAR | Status: AC
Start: 2023-10-03 — End: ?
  Filled 2023-10-03: qty 10

## 2023-10-03 MED ORDER — LIDOCAINE HCL (PF) 1 % IJ SOLN
INTRAMUSCULAR | Status: DC | PRN
  Administered 2023-10-03: 10 mL

## 2023-10-03 MED ORDER — IOHEXOL 350 MG/ML SOLN
INTRAVENOUS | Status: DC | PRN
  Administered 2023-10-03: 90 mL via INTRA_ARTERIAL

## 2023-10-03 MED ORDER — SODIUM CHLORIDE 0.9 % IV SOLN: 250.0000 mL | INTRAVENOUS | Status: DC | PRN

## 2023-10-03 MED ORDER — VERAPAMIL HCL 2.5 MG/ML IV SOLN
INTRAVENOUS | Status: AC
Start: 2023-10-03 — End: ?
  Filled 2023-10-03: qty 2

## 2023-10-03 MED ORDER — HYDRALAZINE HCL 20 MG/ML IJ SOLN: 10.0000 mg | INTRAMUSCULAR | Status: DC | PRN

## 2023-10-03 MED ORDER — MIDAZOLAM HCL 2 MG/2ML IJ SOLN
INTRAMUSCULAR | Status: AC
Start: 2023-10-03 — End: ?
  Filled 2023-10-03: qty 2

## 2023-10-03 MED ORDER — ASPIRIN 81 MG PO CHEW: 81.0000 mg | CHEWABLE_TABLET | ORAL | Status: DC

## 2023-10-03 MED ORDER — MIDAZOLAM HCL 2 MG/2ML IJ SOLN
INTRAMUSCULAR | Status: DC | PRN
  Administered 2023-10-03: 1 mg via INTRAVENOUS

## 2023-10-03 MED ORDER — SODIUM CHLORIDE 0.9% FLUSH: 3.0000 mL | INTRAVENOUS | Status: DC | PRN

## 2023-10-03 MED ORDER — VERAPAMIL HCL 2.5 MG/ML IV SOLN
INTRAVENOUS | Status: AC
  Filled 2023-10-03: qty 2

## 2023-10-03 MED ORDER — FENTANYL CITRATE (PF) 100 MCG/2ML IJ SOLN
INTRAMUSCULAR | Status: AC
  Filled 2023-10-03: qty 2

## 2023-10-03 MED ORDER — SODIUM CHLORIDE 0.9% FLUSH: 3.0000 mL | Freq: Two times a day (BID) | INTRAVENOUS | Status: DC

## 2023-10-03 MED ORDER — HEPARIN SODIUM (PORCINE) 1000 UNIT/ML IJ SOLN
INTRAMUSCULAR | Status: AC
  Filled 2023-10-03: qty 10

## 2023-10-03 MED ORDER — FENTANYL CITRATE (PF) 100 MCG/2ML IJ SOLN
INTRAMUSCULAR | Status: DC | PRN
  Administered 2023-10-03: 25 ug via INTRAVENOUS

## 2023-10-03 MED ORDER — ACETAMINOPHEN 325 MG PO TABS
650.0000 mg | ORAL_TABLET | ORAL | Status: DC | PRN
Start: 2023-10-03 — End: 2023-10-03

## 2023-10-03 MED ORDER — HEPARIN SODIUM (PORCINE) 1000 UNIT/ML IJ SOLN
INTRAMUSCULAR | Status: DC | PRN
  Administered 2023-10-03: 3000 [IU] via INTRAVENOUS

## 2023-10-03 MED ORDER — SODIUM CHLORIDE 0.9 % IV SOLN: INTRAVENOUS | Status: DC

## 2023-10-03 MED ORDER — LIDOCAINE HCL (PF) 1 % IJ SOLN
INTRAMUSCULAR | Status: AC
  Filled 2023-10-03: qty 30

## 2023-10-03 MED ORDER — ONDANSETRON HCL 4 MG/2ML IJ SOLN: 4.0000 mg | Freq: Four times a day (QID) | INTRAMUSCULAR | Status: DC | PRN

## 2023-10-03 SURGICAL SUPPLY — 14 items
CATH 5FR JL3.5 JR4 ANG PIG MP (CATHETERS) IMPLANT
CATH BALLN WEDGE 5F 110CM (CATHETERS) IMPLANT
CATH INFINITI 5FR AL1 (CATHETERS) IMPLANT
CATH LAUNCHER 5F NOTO (CATHETERS) IMPLANT
CATHETER LAUNCHER 5F NOTO (CATHETERS) ×1
DEVICE RAD COMP TR BAND LRG (VASCULAR PRODUCTS) IMPLANT
GLIDESHEATH SLEND SS 6F .021 (SHEATH) IMPLANT
GUIDEWIRE INQWIRE 1.5J.035X260 (WIRE) IMPLANT
INQWIRE 1.5J .035X260CM (WIRE) ×1
PACK CARDIAC CATHETERIZATION (CUSTOM PROCEDURE TRAY) ×1 IMPLANT
SET ATX-X65L (MISCELLANEOUS) IMPLANT
SHEATH 6FR 85 DEST SLENDER (SHEATH) IMPLANT
SHEATH GLIDE SLENDER 4/5FR (SHEATH) IMPLANT
SHEATH PROBE COVER 6X72 (BAG) IMPLANT

## 2023-10-03 NOTE — Interval H&P Note (Signed)
History and Physical Interval Note:  10/03/2023 8:22 AM  Erika Wu  has presented today for surgery, with the diagnosis of aortic stenosis.  The various methods of treatment have been discussed with the patient and family. After consideration of risks, benefits and other options for treatment, the patient has consented to  Procedure(s): RIGHT/LEFT HEART CATH AND CORONARY ANGIOGRAPHY (N/A) as a surgical intervention.  The patient's history has been reviewed, patient examined, no change in status, stable for surgery.  I have reviewed the patient's chart and labs.  Questions were answered to the patient's satisfaction.     Theron Arista [facility] 10/03/2023 8:22 AM

## 2023-10-03 NOTE — Discharge Instructions (Signed)

## 2023-10-03 NOTE — Progress Notes (Signed)
All groin checks performed per prtocol.  Level 0.  ACT is 210 and will recheck to enable sheath to be pulled.

## 2023-10-03 NOTE — Progress Notes (Signed)
TR BAND REMOVAL  LOCATION:    right radial  DEFLATED PER PROTOCOL:    Yes.    TIME BAND OFF / DRESSING APPLIED:    1320 gauze dressing applied   SITE UPON ARRIVAL:    Level 0  SITE AFTER BAND REMOVAL:    Level 0  CIRCULATION SENSATION AND MOVEMENT:    Within Normal Limits   Yes.    COMMENTS:   no issues noted

## 2023-10-04 ENCOUNTER — Encounter (HOSPITAL_COMMUNITY): Payer: Self-pay | Admitting: Cardiology

## 2023-10-07 ENCOUNTER — Other Ambulatory Visit: Payer: Medicare PPO

## 2023-10-07 ENCOUNTER — Ambulatory Visit
Admission: RE | Admit: 2023-10-07 | Discharge: 2023-10-07 | Disposition: A | Discharge status: Home / Self Care | Payer: Medicare PPO | Source: Ambulatory Visit | Attending: Thoracic Surgery (Cardiothoracic Vascular Surgery) | Admitting: Thoracic Surgery (Cardiothoracic Vascular Surgery)

## 2023-10-07 DIAGNOSIS — I350 Nonrheumatic aortic (valve) stenosis: Secondary | ICD-10-CM

## 2023-10-07 DIAGNOSIS — I7121 Aneurysm of the ascending aorta, without rupture: Secondary | ICD-10-CM | POA: Diagnosis not present

## 2023-10-07 DIAGNOSIS — I700 Atherosclerosis of aorta: Secondary | ICD-10-CM | POA: Diagnosis not present

## 2023-10-07 MED ORDER — IOPAMIDOL (ISOVUE-370) INJECTION 76%
500.0000 mL | Freq: Once | INTRAVENOUS | Status: AC | PRN
  Administered 2023-10-07: 75 mL via INTRAVENOUS

## 2023-10-08 ENCOUNTER — Other Ambulatory Visit: Payer: Medicare PPO

## 2023-10-09 ENCOUNTER — Ambulatory Visit: Payer: Medicare PPO | Admitting: Thoracic Surgery (Cardiothoracic Vascular Surgery)

## 2023-10-09 VITALS — BP 148/75 | HR 74 | Resp 18 | Ht 62.0 in | Wt 123.0 lb

## 2023-10-09 DIAGNOSIS — I350 Nonrheumatic aortic (valve) stenosis: Secondary | ICD-10-CM

## 2023-10-09 DIAGNOSIS — I7121 Aneurysm of the ascending aorta, without rupture: Secondary | ICD-10-CM

## 2023-10-09 NOTE — H&P (View-Only) (Signed)
 PCP is Wile, Nyoka Cowden, MD Referring Provider is Swaziland, Peter M, MD  Chief Complaint  Patient presents with   Thoracic Aortic Aneurysm    CTA 1/27, ECHO 10/16, cath 1/23    HPI: Erika Wu returns for follow-up of her ascending aneurysm and to discuss aortic valve replacement.  Erika Wu is a 78 year old woman with a past medical history significant for aortic insufficiency, aortic stenosis, ascending aneurysm, hypertension, hyperlipidemia, reflux, anxiety, and depression.  She has been followed for several years for an ascending aneurysm.  It has been stable at 4.5 cm.  Also followed by Dr. Peter Swaziland for her AI.  This fall she was in [place] and had some issues with shortness of breath, which has since improved.  She has been feeling fatigued more easily.  Denies any orthopnea, PND, or swelling in her legs.  No chest pain.  She recently had an echocardiogram which again showed severe AI and mild to moderate aortic stenosis.  Ejection fraction was 55 to 60%.  Left ventricle dilated with moderate asymmetric left ventricular hypertrophy.  Grade 1 diastolic dysfunction.  Dr. Swaziland did right and left heart catheterization which showed no significant coronary artery disease and normal left ventricular filling pressures and right ventricular pressures.  She sees a Education officer, community regularly.  She has not been having any dental issues.  Past Medical History:  Diagnosis Date   Allergy    Anxiety    Arthritis    Chest pain    a. 03/2015 Lexiscan MV: EF 59%, no ischemia/infarct.   Colon polyps    COVID-19 virus infection    09-22-2020, fully vax'd    Depression    GERD (gastroesophageal reflux disease)    Heart murmur    Hemorrhoids    Hyperlipidemia    Hypertension    Moderate aortic insufficiency    a. 03/2015 Echo: EF 55-60%, no rwma, mild LVH, Gr 1 DD, mild AS, mod AI (bicuspid AoV by report, poorly visualized on this study), Asc Ao diam 44mm, triv MR.    Past Surgical History:   Procedure Laterality Date   CARDIAC CATHETERIZATION N/A 07/24/2016   Procedure: Left Heart Cath and Coronary Angiography;  Surgeon: Peter M Swaziland, MD;  Location: [facility] INVASIVE CV LAB;  Service: Cardiovascular;  Laterality: N/A;   COLONOSCOPY     HAND DEBRIDEMENT  07/2022   right hand   NERVE, TENDON AND ARTERY REPAIR Right 08/07/2022   Procedure: NERVE, TENDON AND ARTERY REPAIR;  Surgeon: Betha Loa, MD;  Location: [facility] OR;  Service: Orthopedics;  Laterality: Right;   POLYPECTOMY     RIGHT HEART CATH AND CORONARY ANGIOGRAPHY N/A 10/03/2023   Procedure: RIGHT HEART CATH AND CORONARY ANGIOGRAPHY;  Surgeon: Swaziland, Peter M, MD;  Location: [facility] INVASIVE CV LAB;  Service: Cardiovascular;  Laterality: N/A;   THORACIC AORTOGRAM N/A 10/03/2023   Procedure: THORACIC AORTOGRAM;  Surgeon: Swaziland, Peter M, MD;  Location: [facility] INVASIVE CV LAB;  Service: Cardiovascular;  Laterality: N/A;   TONSILLECTOMY AND ADENOIDECTOMY     TUBAL LIGATION     WOUND EXPLORATION Right 08/07/2022   Procedure: WOUND EXPLORATION RIGHT WRIST;  Surgeon: Betha Loa, MD;  Location: [facility] OR;  Service: Orthopedics;  Laterality: Right;  60 MIN    Family History  Problem Relation Age of Onset   Heart attack Father        Died @ 47.  Had CABG in his 60's.   Heart disease Father    HIV Brother    Stomach  cancer Maternal Grandmother    Colon cancer Maternal Grandmother    Diverticulosis Maternal Grandfather    Stroke Paternal Grandfather    Hypertension Neg Hx    Esophageal cancer Neg Hx    Rectal cancer Neg Hx    Breast cancer Neg Hx    Inflammatory bowel disease Neg Hx    Liver disease Neg Hx    Pancreatic cancer Neg Hx     Social History Social History   Tobacco Use   Smoking status: Former    Current packs/day: 0.50    Average packs/day: 0.5 packs/day for 15.0 years (7.5 ttl pk-yrs)    Types: Cigarettes   Smokeless tobacco: Never  Vaping Use   Vaping status: Never Used  Substance Use Topics   Alcohol use: Yes     Alcohol/week: 0.0 standard drinks of alcohol    Comment: 2-3 per day   Drug use: No    Current Outpatient Medications  Medication Sig Dispense Refill   amLODipine (NORVASC) 5 MG tablet TAKE 1 TABLET(5 MG) BY MOUTH DAILY 90 tablet 3   buPROPion (WELLBUTRIN XL) 150 MG 24 hr tablet Take 300 mg by mouth every morning.  3   carvedilol (COREG) 25 MG tablet TAKE 1 TABLET(25 MG) BY MOUTH TWICE DAILY 180 tablet 3   hydrALAZINE (APRESOLINE) 50 MG tablet Take 50 mg by mouth as needed (as needed for systolic Blood pressure greater than 170).     mirtazapine (REMERON) 45 MG tablet Take 45 mg by mouth at bedtime.     pantoprazole (PROTONIX) 40 MG tablet TAKE 1 TABLET(40 MG) BY MOUTH TWICE DAILY BEFORE A MEAL 180 tablet 3   rosuvastatin (CRESTOR) 10 MG tablet Take 10 mg by mouth daily.     Suvorexant (BELSOMRA) 20 MG TABS Take 20 mg by mouth at bedtime.     valsartan (DIOVAN) 80 MG tablet Take 1 tablet (80 mg total) by mouth 2 (two) times daily. 180 tablet 3   No current facility-administered medications for this visit.    Allergies  Allergen Reactions   Latex Itching   Tramadol Nausea And Vomiting    And made her feel bad    Review of Systems  Constitutional:  Positive for fatigue.  HENT:  Negative for trouble swallowing and voice change.   Eyes:  Negative for visual disturbance.  Respiratory:  Positive for cough and shortness of breath.   Cardiovascular:  Negative for chest pain and leg swelling.  Genitourinary:  Negative for difficulty urinating and dysuria.  Neurological:  Negative for syncope and weakness.    BP (!) 148/75   Pulse 74   Resp 18   Ht 5\' 2"  (1.575 m)   Wt 123 lb (55.8 kg)   SpO2 94% Comment: RA  BMI 22.50 kg/m  Physical Exam Vitals reviewed.  Constitutional:      General: She is not in acute distress.    Appearance: Normal appearance.  HENT:     Head: Normocephalic and atraumatic.  Eyes:     General: No scleral icterus.    Extraocular Movements: Extraocular  movements intact.  Neck:     Vascular: Carotid bruit (Transmitted murmur) present.  Cardiovascular:     Rate and Rhythm: Normal rate and regular rhythm.     Heart sounds: Murmur (3/6 systolic and diastolic murmurs most prominent right upper sternal border) heard.  Pulmonary:     Effort: Pulmonary effort is normal. No respiratory distress.     Breath sounds: Normal breath sounds. No  wheezing or rales.  Abdominal:     General: There is no distension.     Palpations: Abdomen is soft.  Musculoskeletal:     Cervical back: Neck supple.     Right lower leg: No edema.     Left lower leg: No edema.  Neurological:     General: No focal deficit present.     Mental Status: She is alert and oriented to person, place, and time.     Cranial Nerves: No cranial nerve deficit.     Motor: No weakness.    Diagnostic Tests: Echocardiogram 06/26/2023 IMPRESSIONS     1. Severe aortic valve regurgitation with high flow and probable  mild-moderate aortic valve stenosis given mildly reduced cusp opening.  LVESDi 27 mm/m2, LVESVi 45.9 ml/m2. Linear dimensions have not  significantly changed compared to prior exam on side  by side comparison. The aortic valve is tricuspid. There is mild  calcification of the aortic valve. Aortic valve regurgitation is severe.  Aortic valve area, by VTI measures 1.39 cm. Aortic valve mean gradient  measures 30.0 mmHg. Aortic valve Vmax  measures 3.85 m/s.   2. Left ventricular ejection fraction, by estimation, is 55 to 60%. The  left ventricle has normal function. The left ventricle has no regional  wall motion abnormalities. The left ventricular internal cavity size was  mildly dilated. There is moderate  asymmetric left ventricular hypertrophy. Left ventricular diastolic  parameters are consistent with Grade I diastolic dysfunction (impaired  relaxation).   3. Right ventricular systolic function is normal. The right ventricular  size is normal. There is normal  pulmonary artery systolic pressure. The  estimated right ventricular systolic pressure is 20.3 mmHg.   4. Left atrial size was mildly dilated.   5. The mitral valve is degenerative. Mild mitral valve regurgitation. No  evidence of mitral stenosis.   6. Aorta angulation makes basal ventricular septal hypertrophy appear  more prominent.. Aortic dilatation noted. There is mild-moderate  dilatation of the ascending aorta, measuring 44 mm.   7. The inferior vena cava is normal in size with greater than 50%  respiratory variability, suggesting right atrial pressure of 3 mmHg.   Cardiac catheterization images reviewed.  No significant coronary disease.  I also reviewed the echocardiogram images.  There is severe AI and moderate AS.  Impression: Erika Wu is a 78 year old woman with a past medical history significant for aortic insufficiency, aortic stenosis, ascending aneurysm, hypertension, hyperlipidemia, reflux, anxiety, and depression.   Ascending aneurysm--stable at 4.5 cm.  No independent indication for surgery but does need to be replaced at time of aortic valve replacement.  The root appears normal and the aorta tapers to normal prior to the takeoff of the innominate artery so I think it will just be a straight graft of the ascending.  No need for circulatory arrest.  Aortic insufficiency and aortic stenosis-severe AI.  Now having some symptoms.  Still has preserved left regular systolic function but is showing grade 1 diastolic dysfunction.  Agree with Dr. Swaziland that aortic valve replacement is indicated.  I discussed the proposed operative procedure of aortic valve replacement and repair of ascending aneurysm with Mrs. Dockendorf and her daughter.  Informed them of the general nature of the procedure including the need for general anesthesia, the incisions to be used, the use of cardiopulmonary bypass, the use of temporary pacemaker wires and drainage tubes postoperatively, the expected  hospital stay, and the overall recovery.  I informed them of the indications, risks,  benefits, and alternatives.  They understand the risks include, but not limited to death, MI, DVT, PE, bleeding, possible need for transfusion, infection, cardiac arrhythmias, complete heart block prior permanent pacemaker placement, respiratory renal failure, as well as possibility of other unforeseeable complications.  She understands accepts the risks and wishes to proceed.  We did discuss the use of tissue versus mechanical valves.  In her age group a tissue valve is indicated.  She is in agreement with that plan.  She was to make some arrangements with her family and wait until her son returns from a trip to [place] before having surgery in about 3 to 4 weeks.  Plan: She will call to schedule aortic valve replacement and ascending aneurysm repair  I spent over 45 minutes in review of records, images, and in consultation with Mrs. Claflin today  [name], MD [facility] [phone]

## 2023-10-09 NOTE — Progress Notes (Signed)
PCP is Wile, Nyoka Cowden, MD Referring Provider is [place], Peter M, MD  Chief Complaint  Patient presents with   Thoracic Aortic Aneurysm    CTA 1/27, ECHO 10/16, cath 1/23    HPI: Erika Wu returns for follow-up of her ascending aneurysm and to discuss aortic valve replacement.  Erika Wu is a 78 year old woman with a past medical history significant for aortic insufficiency, aortic stenosis, ascending aneurysm, hypertension, hyperlipidemia, reflux, anxiety, and depression.  She has been followed for several years for an ascending aneurysm.  It has been stable at 4.5 cm.  Also followed by Dr. Peter Swaziland for her AI.  This fall she was in [place] and had some issues with shortness of breath, which has since improved.  She has been feeling fatigued more easily.  Denies any orthopnea, PND, or swelling in her legs.  No chest pain.  She recently had an echocardiogram which again showed severe AI and mild to moderate aortic stenosis.  Ejection fraction was 55 to 60%.  Left ventricle dilated with moderate asymmetric left ventricular hypertrophy.  Grade 1 diastolic dysfunction.  Dr. Swaziland did right and left heart catheterization which showed no significant coronary artery disease and normal left ventricular filling pressures and right ventricular pressures.  She sees a Education officer, community regularly.  She has not been having any dental issues.  Past Medical History:  Diagnosis Date   Allergy    Anxiety    Arthritis    Chest pain    a. 03/2015 Lexiscan MV: EF 59%, no ischemia/infarct.   Colon polyps    COVID-19 virus infection    09-22-2020, fully vax'd    Depression    GERD (gastroesophageal reflux disease)    Heart murmur    Hemorrhoids    Hyperlipidemia    Hypertension    Moderate aortic insufficiency    a. 03/2015 Echo: EF 55-60%, no rwma, mild LVH, Gr 1 DD, mild AS, mod AI (bicuspid AoV by report, poorly visualized on this study), Asc Ao diam 44mm, triv MR.    Past Surgical History:   Procedure Laterality Date   CARDIAC CATHETERIZATION N/A 07/24/2016   Procedure: Left Heart Cath and Coronary Angiography;  Surgeon: Peter M Swaziland, MD;  Location: [facility] INVASIVE CV LAB;  Service: Cardiovascular;  Laterality: N/A;   COLONOSCOPY     HAND DEBRIDEMENT  07/2022   right hand   NERVE, TENDON AND ARTERY REPAIR Right 08/07/2022   Procedure: NERVE, TENDON AND ARTERY REPAIR;  Surgeon: Betha Loa, MD;  Location: [facility] OR;  Service: Orthopedics;  Laterality: Right;   POLYPECTOMY     RIGHT HEART CATH AND CORONARY ANGIOGRAPHY N/A 10/03/2023   Procedure: RIGHT HEART CATH AND CORONARY ANGIOGRAPHY;  Surgeon: Swaziland, Peter M, MD;  Location: [facility] INVASIVE CV LAB;  Service: Cardiovascular;  Laterality: N/A;   THORACIC AORTOGRAM N/A 10/03/2023   Procedure: THORACIC AORTOGRAM;  Surgeon: Swaziland, Peter M, MD;  Location: [facility] INVASIVE CV LAB;  Service: Cardiovascular;  Laterality: N/A;   TONSILLECTOMY AND ADENOIDECTOMY     TUBAL LIGATION     WOUND EXPLORATION Right 08/07/2022   Procedure: WOUND EXPLORATION RIGHT WRIST;  Surgeon: Betha Loa, MD;  Location: [facility] OR;  Service: Orthopedics;  Laterality: Right;  60 MIN    Family History  Problem Relation Age of Onset   Heart attack Father        Died @ 47.  Had CABG in his 60's.   Heart disease Father    HIV Brother    Stomach  cancer Maternal Grandmother    Colon cancer Maternal Grandmother    Diverticulosis Maternal Grandfather    Stroke Paternal Grandfather    Hypertension Neg Hx    Esophageal cancer Neg Hx    Rectal cancer Neg Hx    Breast cancer Neg Hx    Inflammatory bowel disease Neg Hx    Liver disease Neg Hx    Pancreatic cancer Neg Hx     Social History Social History   Tobacco Use   Smoking status: Former    Current packs/day: 0.50    Average packs/day: 0.5 packs/day for 15.0 years (7.5 ttl pk-yrs)    Types: Cigarettes   Smokeless tobacco: Never  Vaping Use   Vaping status: Never Used  Substance Use Topics   Alcohol use: Yes     Alcohol/week: 0.0 standard drinks of alcohol    Comment: 2-3 per day   Drug use: No    Current Outpatient Medications  Medication Sig Dispense Refill   amLODipine (NORVASC) 5 MG tablet TAKE 1 TABLET(5 MG) BY MOUTH DAILY 90 tablet 3   buPROPion (WELLBUTRIN XL) 150 MG 24 hr tablet Take 300 mg by mouth every morning.  3   carvedilol (COREG) 25 MG tablet TAKE 1 TABLET(25 MG) BY MOUTH TWICE DAILY 180 tablet 3   hydrALAZINE (APRESOLINE) 50 MG tablet Take 50 mg by mouth as needed (as needed for systolic Blood pressure greater than 170).     mirtazapine (REMERON) 45 MG tablet Take 45 mg by mouth at bedtime.     pantoprazole (PROTONIX) 40 MG tablet TAKE 1 TABLET(40 MG) BY MOUTH TWICE DAILY BEFORE A MEAL 180 tablet 3   rosuvastatin (CRESTOR) 10 MG tablet Take 10 mg by mouth daily.     Suvorexant (BELSOMRA) 20 MG TABS Take 20 mg by mouth at bedtime.     valsartan (DIOVAN) 80 MG tablet Take 1 tablet (80 mg total) by mouth 2 (two) times daily. 180 tablet 3   No current facility-administered medications for this visit.    Allergies  Allergen Reactions   Latex Itching   Tramadol Nausea And Vomiting    And made her feel bad    Review of Systems  Constitutional:  Positive for fatigue.  HENT:  Negative for trouble swallowing and voice change.   Eyes:  Negative for visual disturbance.  Respiratory:  Positive for cough and shortness of breath.   Cardiovascular:  Negative for chest pain and leg swelling.  Genitourinary:  Negative for difficulty urinating and dysuria.  Neurological:  Negative for syncope and weakness.    BP (!) 148/75   Pulse 74   Resp 18   Ht 5\' 2"  (1.575 m)   Wt 123 lb (55.8 kg)   SpO2 94% Comment: RA  BMI 22.50 kg/m  Physical Exam Vitals reviewed.  Constitutional:      General: She is not in acute distress.    Appearance: Normal appearance.  HENT:     Head: Normocephalic and atraumatic.  Eyes:     General: No scleral icterus.    Extraocular Movements: Extraocular  movements intact.  Neck:     Vascular: Carotid bruit (Transmitted murmur) present.  Cardiovascular:     Rate and Rhythm: Normal rate and regular rhythm.     Heart sounds: Murmur (3/6 systolic and diastolic murmurs most prominent right upper sternal border) heard.  Pulmonary:     Effort: Pulmonary effort is normal. No respiratory distress.     Breath sounds: Normal breath sounds. No  wheezing or rales.  Abdominal:     General: There is no distension.     Palpations: Abdomen is soft.  Musculoskeletal:     Cervical back: Neck supple.     Right lower leg: No edema.     Left lower leg: No edema.  Neurological:     General: No focal deficit present.     Mental Status: She is alert and oriented to person, place, and time.     Cranial Nerves: No cranial nerve deficit.     Motor: No weakness.    Diagnostic Tests: Echocardiogram 06/26/2023 IMPRESSIONS     1. Severe aortic valve regurgitation with high flow and probable  mild-moderate aortic valve stenosis given mildly reduced cusp opening.  LVESDi 27 mm/m2, LVESVi 45.9 ml/m2. Linear dimensions have not  significantly changed compared to prior exam on side  by side comparison. The aortic valve is tricuspid. There is mild  calcification of the aortic valve. Aortic valve regurgitation is severe.  Aortic valve area, by VTI measures 1.39 cm. Aortic valve mean gradient  measures 30.0 mmHg. Aortic valve Vmax  measures 3.85 m/s.   2. Left ventricular ejection fraction, by estimation, is 55 to 60%. The  left ventricle has normal function. The left ventricle has no regional  wall motion abnormalities. The left ventricular internal cavity size was  mildly dilated. There is moderate  asymmetric left ventricular hypertrophy. Left ventricular diastolic  parameters are consistent with Grade I diastolic dysfunction (impaired  relaxation).   3. Right ventricular systolic function is normal. The right ventricular  size is normal. There is normal  pulmonary artery systolic pressure. The  estimated right ventricular systolic pressure is 20.3 mmHg.   4. Left atrial size was mildly dilated.   5. The mitral valve is degenerative. Mild mitral valve regurgitation. No  evidence of mitral stenosis.   6. Aorta angulation makes basal ventricular septal hypertrophy appear  more prominent.. Aortic dilatation noted. There is mild-moderate  dilatation of the ascending aorta, measuring 44 mm.   7. The inferior vena cava is normal in size with greater than 50%  respiratory variability, suggesting right atrial pressure of 3 mmHg.   Cardiac catheterization images reviewed.  No significant coronary disease.  I also reviewed the echocardiogram images.  There is severe AI and moderate AS.  Impression: Erika Wu is a 78 year old woman with a past medical history significant for aortic insufficiency, aortic stenosis, ascending aneurysm, hypertension, hyperlipidemia, reflux, anxiety, and depression.   Ascending aneurysm--stable at 4.5 cm.  No independent indication for surgery but does need to be replaced at time of aortic valve replacement.  The root appears normal and the aorta tapers to normal prior to the takeoff of the innominate artery so I think it will just be a straight graft of the ascending.  No need for circulatory arrest.  Aortic insufficiency and aortic stenosis-severe AI.  Now having some symptoms.  Still has preserved left regular systolic function but is showing grade 1 diastolic dysfunction.  Agree with Dr. Sw[place]iland that aortic valve replacement is indicated.  I discussed the proposed operative procedure of aortic valve replacement and repair of ascending aneurysm with Mrs. Dockendorf and her daughter.  Informed them of the general nature of the procedure including the need for general anesthesia, the incisions to be used, the use of cardiopulmonary bypass, the use of temporary pacemaker wires and drainage tubes postoperatively, the expected  hospital stay, and the overall recovery.  I informed them of the indications, risks,  benefits, and alternatives.  They understand the risks include, but not limited to death, MI, DVT, PE, bleeding, possible need for transfusion, infection, cardiac arrhythmias, complete heart block prior permanent pacemaker placement, respiratory renal failure, as well as possibility of other unforeseeable complications.  She understands accepts the risks and wishes to proceed.  We did discuss the use of tissue versus mechanical valves.  In her age group a tissue valve is indicated.  She is in agreement with that plan.  She was to make some arrangements with her family and wait until her son returns from a trip to [place] before having surgery in about 3 to 4 weeks.  Plan: She will call to schedule aortic valve replacement and ascending aneurysm repair  I spent over 45 minutes in review of records, images, and in consultation with Mrs. Claflin today  [name], MD [facility] [phone]

## 2023-10-10 DIAGNOSIS — Z23 Encounter for immunization: Secondary | ICD-10-CM | POA: Diagnosis not present

## 2023-10-10 DIAGNOSIS — I2510 Atherosclerotic heart disease of native coronary artery without angina pectoris: Secondary | ICD-10-CM | POA: Diagnosis not present

## 2023-10-10 DIAGNOSIS — I712 Thoracic aortic aneurysm, without rupture, unspecified: Secondary | ICD-10-CM | POA: Diagnosis not present

## 2023-10-10 DIAGNOSIS — I351 Nonrheumatic aortic (valve) insufficiency: Secondary | ICD-10-CM | POA: Diagnosis not present

## 2023-10-10 DIAGNOSIS — I119 Hypertensive heart disease without heart failure: Secondary | ICD-10-CM | POA: Diagnosis not present

## 2023-10-10 DIAGNOSIS — K6389 Other specified diseases of intestine: Secondary | ICD-10-CM | POA: Diagnosis not present

## 2023-10-11 ENCOUNTER — Other Ambulatory Visit: Payer: Self-pay | Admitting: *Deleted

## 2023-10-11 ENCOUNTER — Encounter: Payer: Self-pay | Admitting: *Deleted

## 2023-10-11 DIAGNOSIS — I7121 Aneurysm of the ascending aorta, without rupture: Secondary | ICD-10-CM

## 2023-10-11 DIAGNOSIS — I351 Nonrheumatic aortic (valve) insufficiency: Secondary | ICD-10-CM

## 2023-10-11 DIAGNOSIS — I350 Nonrheumatic aortic (valve) stenosis: Secondary | ICD-10-CM

## 2023-10-14 NOTE — Progress Notes (Deleted)
 Cardiology Office Note    Date:  10/14/2023   ID:  Erika Wu 1946/07/20, MRN [id number]  PCP:  Erika Leita VEAR, MD  Cardiologist:  Erika Glauber, MD    History of Present Illness:  Erika Wu is a 78 y.o. female seen for post cath follow up.  She also has a history of aortic stenosis/AI. She had a normal Myoview  study in July 2016. She also had an Echo at that time showing Aortic root enlargement with moderate  AI. This was stable from prior exams. Seen in October 2017 with symptoms of chest pain and palpitations. Echo reported severe AI with dilated aorta of 4.4 cm. EF and LV dimensions were normal. Myoview  study was abnormal with evidence of apical ischemia. This led to a cardiac cath in November 2017 showing minimal nonobstructive CAD. Normal LV function and normal LVEDP. She also wore an event monitor that was normal with no arrhythmia.   Echo in March 2021 showed normal EF with moderate AI.   Echocardiogram obtained on 01/23/2021 showed EF 65 to 70%, grade 1 DD, mild LVH, moderate to severe AI with moderate aortic stenosis, borderline dilated ascending aorta measuring at 39 mm.  CTA of the chest obtained on 03/30/2021 showed a  thoracic aortic aneurysm measuring at 4.5 cm.   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     HIDA scan obtained on 05/22/2021 was normal.  She was seen in September 2022 with complaints of intermittent palpitations. She wore an event monitor which showed some minor NSVT. She did have some episodes of SVT longest lasting 3 minutes. Metoprolol  dose was increased. When seen in November symptoms were improved.   Follow up CT in February showed stable aortic size 4.5 cm. Last Echo in October showed severe AI, mild AS. EF preserved. LV dimension OK.   She is seen  today with her daughter. In October she was visiting family in [place] when she felt ill with cough and no energy. Was seen in ED. CXR was clear. Ecg no change. Troponin normal. Chemistries and CBC Ok. Tested negative for flu and Covid. Since then she is better but still has a mild cough. Prior to this she has noted that when she walks she sometimes will give out more easily and have to stop. No swelling, orthopnea or PND.   She is seen with her daughter today. Still notes over past 6 months that she gives out more easily. Slight swelling in ankles. No chest pain. No SOB. Is having GI complaints of diarrhea, nausea. Seeing Erika Wu. Considered for EGD.     Past Medical History:  Diagnosis Date   Allergy    Anxiety    Arthritis    Chest pain    a. 03/2015 Lexiscan  MV: EF 59%, no ischemia/infarct.   Colon polyps    COVID-19 virus infection    09-22-2020, fully vax'd    Depression    GERD (gastroesophageal reflux disease)    Heart murmur    Hemorrhoids    Hyperlipidemia    Hypertension    Moderate aortic insufficiency    a. 03/2015 Echo: EF 55-60%, no rwma, mild LVH, Gr 1 DD, mild AS, mod AI (bicuspid AoV by report, poorly visualized on this study), Asc Ao diam 44mm, triv MR.    Past Surgical  History:  Procedure Laterality Date   CARDIAC CATHETERIZATION N/A 07/24/2016   Procedure: Left Heart Cath and Coronary Angiography;  Surgeon: Erika Norwood Wu Lailani Tool, MD;  Location: [facility] INVASIVE CV LAB;  Service: Cardiovascular;  Laterality: N/A;   COLONOSCOPY     HAND DEBRIDEMENT  07/2022   right hand   NERVE, TENDON AND ARTERY REPAIR Right 08/07/2022   Procedure: NERVE, TENDON AND ARTERY REPAIR;  Surgeon: Erika Drivers, MD;  Location: [facility] OR;  Service: Orthopedics;  Laterality: Right;   POLYPECTOMY     RIGHT HEART CATH AND CORONARY ANGIOGRAPHY N/A 10/03/2023   Procedure: RIGHT HEART CATH AND CORONARY ANGIOGRAPHY;  Surgeon: Erika Venditto M, MD;  Location: [facility] INVASIVE CV LAB;  Service: Cardiovascular;   Laterality: N/A;   THORACIC AORTOGRAM N/A 10/03/2023   Procedure: THORACIC AORTOGRAM;  Surgeon: Erika Delpriore M, MD;  Location: [facility] INVASIVE CV LAB;  Service: Cardiovascular;  Laterality: N/A;   TONSILLECTOMY AND ADENOIDECTOMY     TUBAL LIGATION     WOUND EXPLORATION Right 08/07/2022   Procedure: WOUND EXPLORATION RIGHT WRIST;  Surgeon: Erika Drivers, MD;  Location: [facility] OR;  Service: Orthopedics;  Laterality: Right;  60 MIN    Current Medications: Outpatient Medications Prior to Visit  Medication Sig Dispense Refill   amLODipine  (NORVASC ) 5 MG tablet TAKE 1 TABLET(5 MG) BY MOUTH DAILY 90 tablet 3   buPROPion  (WELLBUTRIN  XL) 150 MG 24 hr tablet Take 300 mg by mouth every morning.  3   carvedilol  (COREG ) 25 MG tablet TAKE 1 TABLET(25 MG) BY MOUTH TWICE DAILY 180 tablet 3   hydrALAZINE  (APRESOLINE ) 50 MG tablet Take 50 mg by mouth as needed (as needed for systolic Blood pressure greater than 170).     mirtazapine  (REMERON ) 45 MG tablet Take 45 mg by mouth at bedtime.     pantoprazole  (PROTONIX ) 40 MG tablet TAKE 1 TABLET(40 MG) BY MOUTH TWICE DAILY BEFORE A MEAL 180 tablet 3   rosuvastatin  (CRESTOR ) 10 MG tablet Take 10 mg by mouth daily.     Suvorexant  (BELSOMRA ) 20 MG TABS Take 20 mg by mouth at bedtime.     valsartan  (DIOVAN ) 80 MG tablet Take 1 tablet (80 mg total) by mouth 2 (two) times daily. 180 tablet 3   No facility-administered medications prior to visit.     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  Tobacco Use   Smoking status: Former    Current packs/day: 0.50    Average packs/day: 0.5 packs/day for 15.0 years (7.5 ttl pk-yrs)    Types: Cigarettes   Smokeless tobacco: Never   Vaping Use   Vaping status: Never Used  Substance and Sexual Activity   Alcohol  use: Yes    Alcohol /week: 0.0 standard drinks of alcohol     Comment: 2-3 per day   Drug use: No   Sexual activity: Not Currently  Other Topics Concern   Not on file  Social History Narrative   Not on file   Social Wu of Health   Financial Resource Strain: Not on file  Food Insecurity: Not on file  Transportation Needs: Not on file  Physical Activity: Not on file  Stress: Not on file  Social Connections: Unknown (07/06/2023)   Received from [facility]   Social Network    Social Network: Not on file     Family History:  The patient's family history includes Colon cancer in her maternal grandmother; Diverticulosis in her maternal grandfather; HIV in her brother; Heart attack in her father; Heart disease in her father; Stomach cancer in her maternal grandmother; Stroke in her paternal grandfather.   ROS:   Please see the history of present illness.    ROS All other systems reviewed and are negative.   PHYSICAL EXAM:   VS:  There were no vitals taken for this visit.   GEN: Well nourished, well developed, in no acute distress  HEENT: normal  Neck: no JVD, carotid bruits, or masses Cardiac: RRR; there is a gr 2/6 systolic ejection murmur RUSB with a blowing 3/6 diastolic murmur, no edema  Respiratory:  clear to auscultation bilaterally, normal work of breathing GI: soft, nontender, nondistended, + BS MS: no deformity or atrophy  Skin: warm and dry, no rash Neuro:  Alert and Oriented x 3, Strength and sensation are intact Psych: euthymic mood, full affect  Wt Readings from Last 3 Encounters:  10/09/23 123 lb (55.8 kg)  10/03/23 121 lb (54.9 kg)  09/25/23 121 lb (54.9 kg)      Studies/Labs Reviewed:        Recent Labs: 09/25/2023: BUN 16; Creatinine, Ser 0.65; Platelets 208 10/03/2023: Hemoglobin 11.2; Hemoglobin 11.2; Potassium 3.8; Potassium 3.7; Sodium 142; Sodium 142    Lipid Panel    Component Value Date/Time   CHOL 190 07/19/2016 1050   TRIG 77 07/19/2016 1050   HDL 86 07/19/2016 1050   CHOLHDL 2.2 07/19/2016 1050   VLDL 15 07/19/2016 1050   LDLCALC 89 07/19/2016 1050  Dated 04/14/20: normal CBC Dated 11/14/20: cholesterol 198, triglycerides 80, HDL 77, LDL 105. CMET and TSH normal Dated 11/16/21: cholesterol 142, triglycerides 61, HDL 60, LDL 70. CMET and TSH normal Dated 09/19/22: normal CBC.  Dated 12/25/22: cholesterol 144, triglycerides 66, HDL 73, LDL 58, CMET and TSH normal.  Dated 07/09/23: CMET and CBC normal.    Additional studies/ records that were reviewed today include:  Myoview  07/13/16:Study Highlights   Nuclear stress EF: 60%. The left ventricular ejection fraction is normal (55-65%). There was no ST segment deviation noted during stress. There is a small defect of mild severity present in the apical lateral and apical location. The defect is reversible and is consistent with a small area of ischemia although shifting breast attenuation artifact. is also a possiblity. This is an intermediate risk study due to apical reversible defect.   Echo: 07/23/16:Study Conclusions   - Left ventricle: The cavity size was normal. There was mild focal   basal hypertrophy of the septum. Systolic function was normal.   The estimated ejection fraction was in the range of 60% to 65%.   Wall motion was normal; there were no regional wall motion   abnormalities. - Aortic valve: Probably trileaflet; moderately thickened,   moderately calcified leaflets. Transvalvular velocity was   minimally increased. There was no stenosis. There was severe   regurgitation. - Aorta: Ascending aortic diameter: 44 mm (S). - Aortic root: The aortic root was moderately dilated. - Mitral valve: There was mild regurgitation.  Cardiac cath 07/24/16:Conclusion     The left ventricular systolic function is normal. LV end diastolic pressure is normal. The left  ventricular ejection fraction is 55-65% by visual estimate.  Prox LAD lesion, 20 %stenosed. Prox RCA to Mid RCA lesion, 10 %stenosed.   1. Mild nonobstructive CAD 2. Normal LV function 3. Normal LVEDP   Plan: medical management.   Event monitor November 2017: normal   Echo 11/17/19: IMPRESSIONS     1. Left ventricular ejection fraction, by estimation, is 60 to 65%. The  left ventricle has normal function. The left ventricle has no regional  wall motion abnormalities. There is severe asymmetric left ventricular  hypertrophy of the basal-septal  segment. Left ventricular diastolic parameters are indeterminate.   2. Right ventricular systolic function is normal. The right ventricular  size is normal. Tricuspid regurgitation signal is inadequate for assessing  PA pressure.   3. The mitral valve is normal in structure. Trivial mitral valve  regurgitation.   4. The aortic valve was not well visualized. Aortic valve regurgitation  is moderate. No aortic stenosis is present.   5. Aortic dilatation noted. There is dilatation of the ascending aorta  measuring 44 mm.   6. The inferior vena cava is normal in size with greater than 50%  respiratory variability, suggesting right atrial pressure of 3 mmHg.   Cardiac monitor 06/2021: Normal sinus rhythm 6 episode of nonsustained VT 4-7 beats Episodes of SVT- appear reentrant with sudden onset and termination. longest lasting 2 minutes and 54 seconds at rate 156 bpm     Patch Wear Time:  14 days and 0 hours (2022-09-29T15:04:27-0400 to 2022-10-13T15:04:27-0400)   Patient had a min HR of 50 bpm, max HR of 188 bpm, and avg HR of 69 bpm. Predominant underlying rhythm was Sinus Rhythm. 6 Ventricular Tachycardia runs occurred, the run with the fastest interval lasting 4 beats with a max rate of 188 bpm, the longest  lasting 7 beats with an avg rate of 128 bpm. 18 Supraventricular Tachycardia runs occurred, the run with the fastest interval lasting 2  mins 54 secs with a max rate of 156 bpm (avg 143 bpm); the run with the fastest interval was also the longest.  Supraventricular Tachycardia was detected within +/- 45 seconds of symptomatic patient event(s). Isolated SVEs were rare (<1.0%), SVE Couplets were rare (<1.0%), and SVE Triplets were rare (<1.0%). Isolated VEs were rare (<1.0%, 2958), VE Couplets were  rare (<1.0%, 115), and VE Triplets were rare (<1.0%, 7).      Echocardiogram 01/2021: 1. Left ventricular ejection fraction, by estimation, is 65 to 70%. The  left ventricle has normal function. The left ventricle has no regional  wall motion abnormalities. There is mild left ventricular hypertrophy.  Left ventricular diastolic parameters  are consistent with Grade I diastolic dysfunction (impaired relaxation).   2. Right ventricular systolic function is normal. The right ventricular  size is normal.   3. The mitral valve is normal in structure. No evidence of mitral valve  regurgitation. No evidence of mitral stenosis.   4. The aortic valve is normal in structure. Aortic valve regurgitation is  severe. Moderate aortic valve stenosis. Aortic regurgitation PHT measures  415 msec. Aortic valve mean gradient measures 18.0 mmHg. Aortic valve Vmax  measures 3.04 Wu/s.   5. There is borderline dilatation of the ascending aorta, measuring 39  mm.   6. The inferior vena cava is normal in size with greater than 50%  respiratory variability, suggesting right atrial pressure of 3 mmHg.   Echo 08/01/21: IMPRESSIONS     1. Left ventricular ejection fraction, by estimation, is 60 to 65%. Left  ventricular ejection fraction by 3D volume  is 65 %. The left ventricle has  normal function. The left ventricle has no regional wall motion  abnormalities. There is moderate left  ventricular hypertrophy. Left ventricular diastolic parameters are  consistent with Grade I diastolic dysfunction (impaired relaxation).   2. Right ventricular systolic  function is normal. The right ventricular  size is normal. Tricuspid regurgitation signal is inadequate for assessing  PA pressure.   3. Left atrial size was severely dilated.   4. The pericardial effusion is anterior to the right ventricle.   5. The mitral valve is degenerative. Mild mitral valve regurgitation. No  evidence of mitral stenosis.   6. The aortic valve is calcified. Aortic valve regurgitation is moderate  to severe. Moderate aortic valve stenosis. Aortic regurgitation PHT  measures 492 msec. Aortic valve area, by VTI measures 1.00 cm. Aortic  valve mean gradient measures 19.0 mmHg.  Aortic valve Vmax measures 3.01 Wu/s.   7. Aortic dilatation noted. There is mild dilatation of the aortic root,  measuring 39 mm.   8. The inferior vena cava is normal in size with greater than 50%  respiratory variability, suggesting right atrial pressure of 3 mmHg.   Comparison(s): EF 65%, moderate AS with severe AI, mean gradient 18 mmHg,  peak 36.51mmHg, Ascend Aor 39mm, AI PHT 415 msec, mild MAC. Compared to  study dated 01/23/2021, The mean AVG has increased from 18 to .  Visually AI appears moderate to severe  but only mild by PHT. Echo images are suboptimol. Consider TEE of the AV  if clinically indicated.     EXAM: CT ANGIOGRAPHY CHEST WITH CONTRAST   TECHNIQUE: Multidetector CT imaging of the chest was performed using the standard protocol during bolus administration of intravenous contrast. Multiplanar CT image reconstructions and MIPs were obtained to evaluate the vascular anatomy.   RADIATION DOSE REDUCTION: This exam was performed according to the departmental dose-optimization program which includes automated exposure control, adjustment of the mA and/or kV according to patient size and/or use of iterative reconstruction technique.   CONTRAST:  75mL OMNIPAQUE  IOHEXOL  350 MG/ML SOLN   COMPARISON:  CT angiography chest 09/26/2021   FINDINGS: Cardiovascular:  Preferential opacification of the pulmonary artery. Stable aneurysmal ascending thoracic aorta measuring up to 4.5 cm in caliber. The descending thoracic aorta is normal in caliber. No dissection. Mild atherosclerotic plaque of the thoracic aorta. At least 2 vessel coronary artery calcifications. Normal heart size. No significant pericardial effusion. The main pulmonary artery is normal in caliber. No pulmonary embolus.   Mediastinum/Nodes: No enlarged mediastinal, hilar, or axillary lymph nodes. Thyroid gland, trachea, and esophagus demonstrate no significant findings.   Lungs/Pleura: Bilateral lower lobe linear atelectasis versus scarring. No focal consolidation. No pulmonary nodule. No pulmonary mass. No pleural effusion. No pneumothorax.   Upper Abdomen: No acute abnormality.   Musculoskeletal:   No chest wall abnormality.   No suspicious lytic or blastic osseous lesions. No acute displaced fracture. Multilevel degenerative changes of the spine.   Review of the MIP images confirms the above findings.   IMPRESSION: 1. Stable aneurysmal ascending thoracic aorta (4.5 cm). Ascending thoracic aortic aneurysm. Recommend semi-annual imaging followup by CTA or MRA and referral to cardiothoracic surgery if not already obtained. This recommendation follows 2010 ACCF/AHA/AATS/ACR/ASA/SCA/SCAI/SIR/STS/SVM Guidelines for the Diagnosis and Management of Patients With Thoracic Aortic Disease. Circulation. 2010; 121: Z733-z630. Aortic aneurysm NOS (ICD10-I71.9). 2. Aortic Atherosclerosis (ICD10-I70.0) with at least 2 vessel coronary calcifications. 3. No acute intrapulmonary abnormality.     Electronically Signed  By: Morgane  Naveau Wu.D.   On: 03/12/2022 15:18  Echo 03/07/22: IMPRESSIONS     1. Left ventricular ejection fraction, by estimation, is 60 to 65%. The  left ventricle has normal function. The left ventricle has no regional  wall motion abnormalities. The left  ventricular internal cavity size was  mildly dilated. There is severe  concentric left ventricular hypertrophy of the basal-septal segment. Left  ventricular diastolic parameters are consistent with Grade I diastolic  dysfunction (impaired relaxation).   2. Right ventricular systolic function is normal. The right ventricular  size is normal. There is normal pulmonary artery systolic pressure. The  estimated right ventricular systolic pressure is 19.0 mmHg.   3. The mitral valve is degenerative. Mild mitral valve regurgitation. No  evidence of mitral stenosis.   4. The aortic valve is abnormal. Aortic valve regurgitation is severe.  Aortic valve area, by VTI measures 1.29 cm, Aortic valve mean gradient  measures 28.0 mmHg, Aortic valve Vmax measures 3.83 Wu/s, DVI is 0.41 but  visually the AV does not appear  stenotic. 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 Cannot get accurate waveforms to demonstrate LVOT  gradient compared to AV gradient. The AV   has been reported as moderate AS in the past but in the Wu Mode and  visually on 2D images the leaflets appear to open well with some AV  sclerosis. Suspect that increased AV gradient and peak velocities are due  in part to increased flow across AV from AI  as well as increased velocity in LVOT from severe BSH.   5. Aortic dilatation noted. Aneurysm of the ascending aorta, measuring 45  mm.   6. The inferior vena cava is normal in size with greater than 50%  respiratory variability, suggesting right atrial pressure of 3 mmHg.   7. Recommend TEE to get planimetry of the AV for accurate measurement of  the AVA as well as assess severity of AI further given LV dilatation.   Echo 08/15/22: IMPRESSIONS     1. Severe AI is present. Gradients across AoV elevated (Vmax 3.7 Wu/s, MG  31 mmHG) but valve opens well. Suspect gradients are  related to  significant AI. Would recommend TEE for clarification. LVIDd is largely  unchanged (49 mm vs 47 mm). The aortic valve   is tricuspid. Aortic valve regurgitation is severe. Mild to moderate  aortic valve stenosis. Aortic valve mean gradient measures 31.0 mmHg.  Aortic valve Vmax measures 3.75 Wu/s.   2. Left ventricular ejection fraction, by estimation, is 55 to 60%. The  left ventricle has normal function. The left ventricle has no regional  wall motion abnormalities. Left ventricular diastolic parameters are  consistent with Grade I diastolic  dysfunction (impaired relaxation).   3. Right ventricular systolic function is normal. The right ventricular  size is normal. There is normal pulmonary artery systolic pressure. The  estimated right ventricular systolic pressure is 19.2 mmHg.   4. The mitral valve is grossly normal. Mild mitral valve regurgitation.  No evidence of mitral stenosis.   5. There is severe dilatation of the ascending aorta, measuring 43 mm.   6. The inferior vena cava is normal in size with greater than 50%  respiratory variability, suggesting right atrial pressure of 3 mmHg.   Comparison(s): No significant change from prior study.   CT ANGIOGRAPHY CHEST WITH CONTRAST   TECHNIQUE:  Multidetector CT imaging of the chest was performed using the standard protocol during bolus administration of intravenous contrast. Multiplanar CT image reconstructions and MIPs were obtained to evaluate the vascular anatomy.   RADIATION DOSE REDUCTION: This exam was performed according to the departmental dose-optimization program which includes automated exposure control, adjustment of the mA and/or kV according to patient size and/or use of iterative reconstruction technique.   CONTRAST:  75mL ISOVUE -370 IOPAMIDOL  (ISOVUE -370) INJECTION 76%   COMPARISON:  March 12, 2022, September 27, 2022 trauma on March 30, 2021   FINDINGS: Cardiovascular: Cardiomegaly. Predominantly  LEFT-sided coronary artery atherosclerotic calcifications. No significant pericardial effusion. Moderate atherosclerotic calcifications of the aorta. ReVisualization of an aneurysm of the ascending thoracic aorta. Ascending thoracic aorta measures approximately 4.5 by 4.6 cm although the evaluation is limited by motion. This is similar in comparison to prior. Sino-tubular junction measures approximately 3.6 cm. The level at the sinuses of Valsalva measure approximately 4 by 4.1 cm.   Mediastinum/Nodes: Visualized thyroid is unremarkable. No axillary adenopathy or mediastinal adenopathy.   Lungs/Pleura: No pleural effusion or pneumothorax. Scattered atelectasis. LEFT lower lobe pulmonary nodule measures 4 mm, stable dating back to July 2022 (series 9, image 277).   Upper Abdomen: Partial visualization of a dilated pancreatic duct and cyst of the LEFT liver. This is better assessed on recent MRI.   Musculoskeletal: Healing LEFT anterolateral rib fractures.   Review of the MIP images confirms the above findings.   IMPRESSION: 1. Similar aneurysmal dilatation of the ascending thoracic aorta measuring up to 4.6 cm. Recommend semi-annual imaging followup by CTA or MRA and referral to cardiothoracic surgery if not already obtained. This recommendation follows 2010 ACCF/AHA/AATS/ACR/ASA/SCA/SCAI/SIR/STS/SVM Guidelines for the Diagnosis and Management of Patients With Thoracic Aortic Disease. Circulation. 2010; 121: [id number]. Aortic aneurysm NOS (ICD10-I71.9) 2. Healing LEFT anterolateral rib fractures. 3. Partial visualization of a dilated pancreatic duct and cyst of the LEFT liver. This is better assessed on recent MRI. 4. Stable 4 mm LEFT lower lobe pulmonary nodule dating back to July 2022. No follow-up needed if patient is low-risk.This recommendation follows the consensus statement: Guidelines for Management of Incidental Pulmonary Nodules Detected on CT Images: From  the Fleischner Society 2017; Radiology 2017; 284:228-243.   Aortic Atherosclerosis (ICD10-I70.0).     Electronically Signed   Cardiac cath 10/03/23: Conclusion  Nonobstructive CAD Severe aortic insufficiency Proximal aortic aneurysm Normal LV filling pressures. PCWP 15/12 mean 13 mm Hg Normal right heart pressures. PAP 28/14, mean 22 mm Hg Normal cardiac output 3.53 L/min, index 2.28  ASSESSMENT:    No diagnosis found.      PLAN:  In order of problems listed above:  1. Hypertension: BP with adequate control on valsartan , amlodipine  and Coreg .  Continue sodium restriction. Widened pulse pressure due to AI  2. Non-obstructive CAD: Cath in 2017 showed mild nonobstructive CAD (pLAD 20%, p-Wu RCA 10%). CT chest showed mild atherosclerosis, coronary artery calcification.   Continue Crestor .  3. Severe AI/aortic stenosis: Most recent recent echocardiogram in October showed EF 55-60%, with mild LV dilation.  Severe AI. She does have some exercise intolerance. I think we are getting close to needing AVR. She does have exercise intolerance and reviewing serial Echos I think her AI is progressing. We discussed AVR and given her enlarge aorta would likely need open surgery with Aortic grafting and not TAVR. Discussed this all with her again. The next step would be to do right and left heart cath. Will schedule this for next week. The  procedure and risks were reviewed including but not limited to death, myocardial infarction, stroke, arrythmias, bleeding, transfusion, emergency surgery, dye allergy, or renal dysfunction. The patient voices understanding and is agreeable to proceed.   4. Ascending aortic aneurysm: No change by CT in Feb. Follows with Erika Kerrin. Continue optimal BP control. Recommend avoidance of quinolone antibiotics.   5. PSVT/syncope: Outpatient monitor in 06/2021 in the setting of intermittent tachypalpitations showed PSVT, longest episode lasting 2 minutes 34 seconds.   no sustained arrhythmia. Continue Coreg .    6. Hyperlipidemia: LDL was 58.  Continue Crestor .   7. GI symptoms. Per Erika Wu. I have no concerns about EGD if he decides. Cleared from a CV standpoint.     Medication Adjustments/Labs and Tests Ordered: Current medicines are reviewed at length with the patient today.  Concerns regarding medicines are outlined above.  Medication changes, Labs and Tests ordered today are listed in the Patient Instructions below. There are no Patient Instructions on file for this visit.    Signed, Pearson Reasons, MD  10/14/2023 8:41 AM    [facility] [address], [place], [place], [address] [phone]

## 2023-10-17 ENCOUNTER — Telehealth: Payer: Self-pay | Admitting: Cardiology

## 2023-10-17 NOTE — Telephone Encounter (Signed)
 Called and spoke to patient. Verified name and DOB. Below message relayed per Dr Jordan. Patient stated she was prescribed Xifaxan  for diarrhea by her GI doctor last week. Patient asked if she should start taking it. She stated the diarrhea comes and goes. Suggested that patient call prescriber to see if they want her to start medication.  I think we can forego this visit. This was just a post cath check and she has already seen Dr Kerrin with plans for surgery. If she has questions let me know  Peter Jordan MD, FACC

## 2023-10-17 NOTE — Telephone Encounter (Signed)
 Patient wants to know if Dr. Swaziland will do a telephone visit tomorrow at 9:00 am instead of her in-office visit as she is currently out of town.

## 2023-10-18 ENCOUNTER — Ambulatory Visit: Payer: Medicare PPO | Admitting: Cardiology

## 2023-10-25 NOTE — Progress Notes (Signed)
Surgical Instructions   Your procedure is scheduled on Wednesday October 30, 2023. Report to [facility] Main Entrance "A" at 6:30 A.M., then check in with the Admitting office. Any questions or running late day of surgery: call [phone]  Questions prior to your surgery date: call [phone], Monday-Friday, 8am-4pm. If you experience any cold or flu symptoms such as cough, fever, chills, shortness of breath, etc. between now and your scheduled surgery, please notify us at the above number.     Remember:  Do not eat or drink after midnight the night before your surgery  Take these medicines the morning of surgery with A SIP OF WATER  amLODipine (NORVASC)  buPROPion (WELLBUTRIN XL)  carvedilol (COREG)  pantoprazole (PROTONIX)  rosuvastatin (CRESTOR)   May take these medicines IF NEEDED: hydrALAZINE (APRESOLINE)    One week prior to surgery, STOP taking any Aspirin (unless otherwise instructed by your surgeon) Aleve, Naproxen, Ibuprofen, Motrin, Advil, Goody's, BC's, all herbal medications, fish oil, and non-prescription vitamins.                     Do NOT Smoke (Tobacco/Vaping) for 24 hours prior to your procedure.  If you use a CPAP at night, you may bring your mask/headgear for your overnight stay.   You will be asked to remove any contacts, glasses, piercing's, hearing aid's, dentures/partials prior to surgery. Please bring cases for these items if needed.    Patients discharged the day of surgery will not be allowed to drive home, and someone needs to stay with them for 24 hours.  SURGICAL WAITING ROOM VISITATION Patients may have no more than 2 support people in the waiting area - these visitors may rotate.   Pre-op nurse will coordinate an appropriate time for 1 ADULT support person, who may not rotate, to accompany patient in pre-op.  Children under the age of 35 must have an adult with them who is not the patient and must remain in the main waiting area with an  adult.  If the patient needs to stay at the hospital during part of their recovery, the visitor guidelines for inpatient rooms apply.  Please refer to the [facility] website for the visitor guidelines for any additional information.   If you received a COVID test during your pre-op visit  it is requested that you wear a mask when out in public, stay away from anyone that may not be feeling well and notify your surgeon if you develop symptoms. If you have been in contact with anyone that has tested positive in the last 10 days please notify you surgeon.      Pre-operative CHG Bathing Instructions   You can play a key role in reducing the risk of infection after surgery. Your skin needs to be as free of germs as possible. You can reduce the number of germs on your skin by washing with CHG (chlorhexidine gluconate) soap before surgery. CHG is an antiseptic soap that kills germs and continues to kill germs even after washing.   DO NOT use if you have an allergy to chlorhexidine/CHG or antibacterial soaps. If your skin becomes reddened or irritated, stop using the CHG and notify one of our RNs at [phone].              TAKE A SHOWER THE NIGHT BEFORE SURGERY AND THE DAY OF SURGERY    Please keep in mind the following:  DO NOT shave, including legs and underarms, 48 hours prior to  surgery.   You may shave your face before/day of surgery.  Place clean sheets on your bed the night before surgery Use a clean washcloth (not used since being washed) for each shower. DO NOT sleep with pet's night before surgery.  CHG Shower Instructions:  Wash your face and private area with normal soap. If you choose to wash your hair, wash first with your normal shampoo.  After you use shampoo/soap, rinse your hair and body thoroughly to remove shampoo/soap residue.  Turn the water OFF and apply half the bottle of CHG soap to a CLEAN washcloth.  Apply CHG soap ONLY FROM YOUR NECK DOWN TO YOUR TOES (washing  for 3-5 minutes)  DO NOT use CHG soap on face, private areas, open wounds, or sores.  Pay special attention to the area where your surgery is being performed.  If you are having back surgery, having someone wash your back for you may be helpful. Wait 2 minutes after CHG soap is applied, then you may rinse off the CHG soap.  Pat dry with a clean towel  Put on clean pajamas    Additional instructions for the day of surgery: DO NOT APPLY any lotions, deodorants or perfumes.   Do not wear jewelry or makeup Do not wear nail polish, gel polish, artificial nails, or any other type of covering on natural nails (fingers and toes) Do not bring valuables to the hospital. [facility] is not responsible for valuables/personal belongings. Put on clean/comfortable clothes.  Please brush your teeth.  Ask your nurse before applying any prescription medications to the skin.

## 2023-10-28 ENCOUNTER — Encounter (HOSPITAL_COMMUNITY): Payer: Self-pay

## 2023-10-28 ENCOUNTER — Ambulatory Visit (HOSPITAL_COMMUNITY)
Admission: RE | Admit: 2023-10-28 | Discharge: 2023-10-28 | Disposition: A | Discharge status: Home / Self Care | Payer: Medicare PPO | Source: Ambulatory Visit | Attending: Thoracic Surgery (Cardiothoracic Vascular Surgery) | Admitting: Thoracic Surgery (Cardiothoracic Vascular Surgery)

## 2023-10-28 ENCOUNTER — Encounter (HOSPITAL_COMMUNITY)
Admission: RE | Admit: 2023-10-28 | Discharge: 2023-10-28 | Disposition: A | Discharge status: Home / Self Care | Payer: Medicare PPO | Source: Ambulatory Visit | Attending: Thoracic Surgery (Cardiothoracic Vascular Surgery)

## 2023-10-28 ENCOUNTER — Other Ambulatory Visit: Payer: Self-pay

## 2023-10-28 VITALS — BP 157/49 | HR 68 | Temp 98.2°F | Resp 17 | Ht 63.0 in | Wt 124.7 lb

## 2023-10-28 DIAGNOSIS — F418 Other specified anxiety disorders: Secondary | ICD-10-CM | POA: Diagnosis not present

## 2023-10-28 DIAGNOSIS — I7101 Dissection of ascending aorta: Secondary | ICD-10-CM | POA: Diagnosis not present

## 2023-10-28 DIAGNOSIS — I447 Left bundle-branch block, unspecified: Secondary | ICD-10-CM | POA: Diagnosis present

## 2023-10-28 DIAGNOSIS — D6959 Other secondary thrombocytopenia: Secondary | ICD-10-CM | POA: Diagnosis not present

## 2023-10-28 DIAGNOSIS — J9811 Atelectasis: Secondary | ICD-10-CM | POA: Diagnosis not present

## 2023-10-28 DIAGNOSIS — I352 Nonrheumatic aortic (valve) stenosis with insufficiency: Secondary | ICD-10-CM | POA: Diagnosis present

## 2023-10-28 DIAGNOSIS — I350 Nonrheumatic aortic (valve) stenosis: Secondary | ICD-10-CM | POA: Diagnosis not present

## 2023-10-28 DIAGNOSIS — I351 Nonrheumatic aortic (valve) insufficiency: Secondary | ICD-10-CM | POA: Diagnosis not present

## 2023-10-28 DIAGNOSIS — E785 Hyperlipidemia, unspecified: Secondary | ICD-10-CM | POA: Diagnosis present

## 2023-10-28 DIAGNOSIS — Z952 Presence of prosthetic heart valve: Secondary | ICD-10-CM | POA: Diagnosis present

## 2023-10-28 DIAGNOSIS — M1990 Unspecified osteoarthritis, unspecified site: Secondary | ICD-10-CM | POA: Diagnosis present

## 2023-10-28 DIAGNOSIS — Z01818 Encounter for other preprocedural examination: Secondary | ICD-10-CM

## 2023-10-28 DIAGNOSIS — I422 Other hypertrophic cardiomyopathy: Secondary | ICD-10-CM | POA: Diagnosis not present

## 2023-10-28 DIAGNOSIS — J90 Pleural effusion, not elsewhere classified: Secondary | ICD-10-CM | POA: Diagnosis not present

## 2023-10-28 DIAGNOSIS — Z452 Encounter for adjustment and management of vascular access device: Secondary | ICD-10-CM | POA: Diagnosis not present

## 2023-10-28 DIAGNOSIS — I4891 Unspecified atrial fibrillation: Secondary | ICD-10-CM | POA: Diagnosis not present

## 2023-10-28 DIAGNOSIS — R918 Other nonspecific abnormal finding of lung field: Secondary | ICD-10-CM | POA: Diagnosis not present

## 2023-10-28 DIAGNOSIS — Z8249 Family history of ischemic heart disease and other diseases of the circulatory system: Secondary | ICD-10-CM | POA: Diagnosis not present

## 2023-10-28 DIAGNOSIS — R140 Abdominal distension (gaseous): Secondary | ICD-10-CM | POA: Diagnosis not present

## 2023-10-28 DIAGNOSIS — D62 Acute posthemorrhagic anemia: Secondary | ICD-10-CM | POA: Diagnosis not present

## 2023-10-28 DIAGNOSIS — Z800 Family history of malignant neoplasm of digestive organs: Secondary | ICD-10-CM | POA: Diagnosis not present

## 2023-10-28 DIAGNOSIS — Z885 Allergy status to narcotic agent status: Secondary | ICD-10-CM | POA: Diagnosis not present

## 2023-10-28 DIAGNOSIS — Z8616 Personal history of COVID-19: Secondary | ICD-10-CM | POA: Diagnosis not present

## 2023-10-28 DIAGNOSIS — Z79899 Other long term (current) drug therapy: Secondary | ICD-10-CM | POA: Diagnosis not present

## 2023-10-28 DIAGNOSIS — R110 Nausea: Secondary | ICD-10-CM | POA: Diagnosis not present

## 2023-10-28 DIAGNOSIS — J984 Other disorders of lung: Secondary | ICD-10-CM | POA: Diagnosis not present

## 2023-10-28 DIAGNOSIS — Z91040 Latex allergy status: Secondary | ICD-10-CM | POA: Diagnosis not present

## 2023-10-28 DIAGNOSIS — I7121 Aneurysm of the ascending aorta, without rupture: Secondary | ICD-10-CM

## 2023-10-28 DIAGNOSIS — K219 Gastro-esophageal reflux disease without esophagitis: Secondary | ICD-10-CM | POA: Diagnosis present

## 2023-10-28 DIAGNOSIS — I10 Essential (primary) hypertension: Secondary | ICD-10-CM | POA: Diagnosis present

## 2023-10-28 DIAGNOSIS — Z48812 Encounter for surgical aftercare following surgery on the circulatory system: Secondary | ICD-10-CM | POA: Diagnosis not present

## 2023-10-28 DIAGNOSIS — I358 Other nonrheumatic aortic valve disorders: Secondary | ICD-10-CM | POA: Diagnosis not present

## 2023-10-28 DIAGNOSIS — I712 Thoracic aortic aneurysm, without rupture, unspecified: Secondary | ICD-10-CM | POA: Diagnosis not present

## 2023-10-28 DIAGNOSIS — F17210 Nicotine dependence, cigarettes, uncomplicated: Secondary | ICD-10-CM | POA: Diagnosis present

## 2023-10-28 DIAGNOSIS — Z823 Family history of stroke: Secondary | ICD-10-CM | POA: Diagnosis not present

## 2023-10-28 HISTORY — DX: Family history of other specified conditions: Z84.89

## 2023-10-28 LAB — SURGICAL PCR SCREEN
MRSA, PCR: NEGATIVE
Staphylococcus aureus: POSITIVE — AB

## 2023-10-28 LAB — CBC
HCT: 39.5 % (ref 36.0–46.0)
Hemoglobin: 13.2 g/dL (ref 12.0–15.0)
MCH: 31.6 pg (ref 26.0–34.0)
MCHC: 33.4 g/dL (ref 30.0–36.0)
MCV: 94.5 fL (ref 80.0–100.0)
Platelets: 202 10*3/uL (ref 150–400)
RBC: 4.18 MIL/uL (ref 3.87–5.11)
RDW: 12.1 % (ref 11.5–15.5)
WBC: 6.9 10*3/uL (ref 4.0–10.5)
nRBC: 0 % (ref 0.0–0.2)

## 2023-10-28 LAB — HEMOGLOBIN A1C
Hgb A1c MFr Bld: 5.2 % (ref 4.8–5.6)
Mean Plasma Glucose: 102.54 mg/dL

## 2023-10-28 LAB — URINALYSIS, ROUTINE W REFLEX MICROSCOPIC
Bilirubin Urine: NEGATIVE
Glucose, UA: NEGATIVE mg/dL
Hgb urine dipstick: NEGATIVE
Ketones, ur: NEGATIVE mg/dL
Leukocytes,Ua: NEGATIVE
Nitrite: NEGATIVE
Protein, ur: NEGATIVE mg/dL
Specific Gravity, Urine: 1.009 (ref 1.005–1.030)
pH: 5 (ref 5.0–8.0)

## 2023-10-28 LAB — COMPREHENSIVE METABOLIC PANEL
ALT: 27 U/L (ref 0–44)
AST: 23 U/L (ref 15–41)
Albumin: 4 g/dL (ref 3.5–5.0)
Alkaline Phosphatase: 80 U/L (ref 38–126)
Anion gap: 10 (ref 5–15)
BUN: 13 mg/dL (ref 8–23)
CO2: 24 mmol/L (ref 22–32)
Calcium: 9.3 mg/dL (ref 8.9–10.3)
Chloride: 105 mmol/L (ref 98–111)
Creatinine, Ser: 0.76 mg/dL (ref 0.44–1.00)
GFR, Estimated: >60 mL/min (ref >60)
Glucose, Bld: 93 mg/dL (ref 70–99)
Potassium: 4 mmol/L (ref 3.5–5.1)
Sodium: 139 mmol/L (ref 135–145)
Total Bilirubin: 0.7 mg/dL (ref 0.0–1.2)
Total Protein: 6.6 g/dL (ref 6.5–8.1)

## 2023-10-28 LAB — TYPE AND SCREEN
ABO/RH(D): O POS
Antibody Screen: NEGATIVE

## 2023-10-28 LAB — PROTIME-INR
INR: 1 (ref 0.8–1.2)
Prothrombin Time: 13.4 s (ref 11.4–15.2)

## 2023-10-28 LAB — APTT: aPTT: 26 s (ref 24–36)

## 2023-10-28 NOTE — Progress Notes (Signed)
PCP - Dorinda Hill Cardiologist - Dr. Peter Swaziland  PPM/ICD - denies Device Orders - n/a Rep Notified - n/a  Chest x-ray - 10/28/23 EKG - 10/28/23 Stress Test - 07/13/16 ECHO - 06/29/23 Cardiac Cath - 10/03/23  Sleep Study - denies CPAP - n/a  No DM  Last dose of GLP1 agonist-  n/a GLP1 instructions: n/a  Blood Thinner Instructions: n/a Aspirin Instructions: n/a  ERAS Protcol - NPO   COVID TEST-  n/a   Anesthesia review: no  Patient denies shortness of breath, fever, cough and chest pain at PAT appointment   All instructions explained to the patient, with a verbal understanding of the material. Patient agrees to go over the instructions while at home for a better understanding. Patient also instructed to self quarantine after being tested for COVID-19. The opportunity to ask questions was provided.

## 2023-10-29 MED ORDER — EPINEPHRINE HCL 5 MG/250ML IV SOLN IN NS
0.0000 ug/min | INTRAVENOUS | Status: DC
  Filled 2023-10-29: qty 250

## 2023-10-29 MED ORDER — INSULIN REGULAR(HUMAN) IN NACL 100-0.9 UT/100ML-% IV SOLN
INTRAVENOUS | Status: AC
  Administered 2023-10-30: 1.3 [IU]/h via INTRAVENOUS
  Filled 2023-10-29: qty 100

## 2023-10-29 MED ORDER — PHENYLEPHRINE HCL-NACL 20-0.9 MG/250ML-% IV SOLN
30.0000 ug/min | INTRAVENOUS | Status: DC
  Filled 2023-10-29: qty 250

## 2023-10-29 MED ORDER — PLASMA-LYTE A IV SOLN
INTRAVENOUS | Status: DC
  Filled 2023-10-29: qty 2.5

## 2023-10-29 MED ORDER — TRANEXAMIC ACID (OHS) PUMP PRIME SOLUTION
2.0000 mg/kg | INTRAVENOUS | Status: DC
  Filled 2023-10-29: qty 1.13

## 2023-10-29 MED ORDER — TRANEXAMIC ACID 1000 MG/10ML IV SOLN
1.5000 mg/kg/h | INTRAVENOUS | Status: AC
  Administered 2023-10-30: 1.5 mg/kg/h via INTRAVENOUS
  Filled 2023-10-29: qty 25

## 2023-10-29 MED ORDER — NITROGLYCERIN IN D5W 200-5 MCG/ML-% IV SOLN
2.0000 ug/min | INTRAVENOUS | Status: DC
  Filled 2023-10-29: qty 250

## 2023-10-29 MED ORDER — TRANEXAMIC ACID (OHS) BOLUS VIA INFUSION
15.0000 mg/kg | INTRAVENOUS | Status: AC
  Administered 2023-10-30: 849 mg via INTRAVENOUS
  Filled 2023-10-29: qty 849

## 2023-10-29 MED ORDER — HEPARIN 30,000 UNITS/1000 ML (OHS) CELLSAVER SOLUTION
Status: DC
  Filled 2023-10-29: qty 1000

## 2023-10-29 MED ORDER — DEXMEDETOMIDINE HCL IN NACL 400 MCG/100ML IV SOLN
0.1000 ug/kg/h | INTRAVENOUS | Status: AC
  Administered 2023-10-30: .4 ug/kg/h via INTRAVENOUS
  Filled 2023-10-29: qty 100

## 2023-10-29 MED ORDER — NOREPINEPHRINE 4 MG/250ML-% IV SOLN
0.0000 ug/min | INTRAVENOUS | Status: AC
  Administered 2023-10-30: 4 ug/min via INTRAVENOUS
  Filled 2023-10-29: qty 250

## 2023-10-29 MED ORDER — VANCOMYCIN HCL 1250 MG/250ML IV SOLN
1250.0000 mg | INTRAVENOUS | Status: AC
Start: 2023-10-30 — End: 2023-10-31
  Administered 2023-10-30: 1250 mg via INTRAVENOUS
  Filled 2023-10-29: qty 250

## 2023-10-29 MED ORDER — CEFAZOLIN SODIUM-DEXTROSE 2-4 GM/100ML-% IV SOLN
2.0000 g | INTRAVENOUS | Status: AC
  Administered 2023-10-30 (×2): 2 g via INTRAVENOUS
  Filled 2023-10-29: qty 100

## 2023-10-29 MED ORDER — MANNITOL 20 % IV SOLN
INTRAVENOUS | Status: DC
  Filled 2023-10-29: qty 13

## 2023-10-29 MED ORDER — CEFAZOLIN SODIUM-DEXTROSE 2-4 GM/100ML-% IV SOLN
2.0000 g | INTRAVENOUS | Status: DC
  Filled 2023-10-29: qty 100

## 2023-10-29 MED ORDER — POTASSIUM CHLORIDE 2 MEQ/ML IV SOLN
80.0000 meq | INTRAVENOUS | Status: DC
  Filled 2023-10-29: qty 40

## 2023-10-29 MED ORDER — MILRINONE LACTATE IN DEXTROSE 20-5 MG/100ML-% IV SOLN
0.3000 ug/kg/min | INTRAVENOUS | Status: DC
  Filled 2023-10-29: qty 100

## 2023-10-29 NOTE — Anesthesia Preprocedure Evaluation (Signed)
Anesthesia Evaluation  Patient identified by MRN, date of birth, ID band Patient awake    Reviewed: Allergy & Precautions, H&P , NPO status , Patient's Chart, lab work & pertinent test results  Airway Mallampati: II  TM Distance: >3 FB Neck ROM: Full    Dental no notable dental hx.    Pulmonary former smoker   Pulmonary exam normal breath sounds clear to auscultation       Cardiovascular hypertension, Normal cardiovascular exam+ Valvular Problems/Murmurs AI  Rhythm:Regular Rate:Normal  Ascending aortic aneurysm    MPRESSIONS     1. Severe aortic valve regurgitation with high flow and probable  mild-moderate aortic valve stenosis given mildly reduced cusp opening.  LVESDi 27 mm/m2, LVESVi 45.9 ml/m2. Linear dimensions have not  significantly changed compared to prior exam on side  by side comparison. The aortic valve is tricuspid. There is mild  calcification of the aortic valve. Aortic valve regurgitation is severe.  Aortic valve area, by VTI measures 1.39 cm. Aortic valve mean gradient  measures 30.0 mmHg. Aortic valve Vmax  measures 3.85 m/s.   2. Left ventricular ejection fraction, by estimation, is 55 to 60%. The  left ventricle has normal function. The left ventricle has no regional  wall motion abnormalities. The left ventricular internal cavity size was  mildly dilated. There is moderate  asymmetric left ventricular hypertrophy. Left ventricular diastolic  parameters are consistent with Grade I diastolic dysfunction (impaired  relaxation).   3. Right ventricular systolic function is normal. The right ventricular  size is normal. There is normal pulmonary artery systolic pressure. The  estimated right ventricular systolic pressure is 20.3 mmHg.   4. Left atrial size was mildly dilated.   5. The mitral valve is degenerative. Mild mitral valve regurgitation. No  evidence of mitral stenosis.   6. Aorta angulation  makes basal ventricular septal hypertrophy appear  more prominent.. Aortic dilatation noted. There is mild-moderate  dilatation of the ascending aorta, measuring 44 mm.   7. The inferior vena cava is normal in size with greater than 50%  respiratory variability, suggesting right atrial pressure of 3 mmHg.     Neuro/Psych  PSYCHIATRIC DISORDERS Anxiety Depression    negative neurological ROS     GI/Hepatic Neg liver ROS,GERD  ,,ESOPHAGEAL STRICTURE   Endo/Other  negative endocrine ROS    Renal/GU negative Renal ROS  negative genitourinary   Musculoskeletal  (+) Arthritis ,    Abdominal   Peds negative pediatric ROS (+)  Hematology negative hematology ROS (+)   Anesthesia Other Findings   Reproductive/Obstetrics negative OB ROS                             Anesthesia Physical Anesthesia Plan  ASA: 4  Anesthesia Plan: General   Post-op Pain Management:    Induction: Intravenous  PONV Risk Score and Plan: 3 and Ondansetron and Treatment may vary due to age or medical condition  Airway Management Planned: Oral ETT  Additional Equipment: Arterial line, CVP, PA Cath, 3D TEE, TEE and Ultrasound Guidance Line Placement  Intra-op Plan:   Post-operative Plan: Post-operative intubation/ventilation  Informed Consent: I have reviewed the patients History and Physical, chart, labs and discussed the procedure including the risks, benefits and alternatives for the proposed anesthesia with the patient or authorized representative who has indicated his/her understanding and acceptance.     Dental advisory given  Plan Discussed with: CRNA  Anesthesia Plan Comments:  Anesthesia Quick Evaluation

## 2023-10-30 ENCOUNTER — Encounter (HOSPITAL_COMMUNITY): Payer: Self-pay | Admitting: Thoracic Surgery (Cardiothoracic Vascular Surgery)

## 2023-10-30 ENCOUNTER — Inpatient Hospital Stay (HOSPITAL_COMMUNITY): Payer: Medicare PPO

## 2023-10-30 ENCOUNTER — Other Ambulatory Visit: Payer: Self-pay

## 2023-10-30 ENCOUNTER — Inpatient Hospital Stay (HOSPITAL_COMMUNITY): Payer: Self-pay | Admitting: Anesthesiology

## 2023-10-30 ENCOUNTER — Encounter (HOSPITAL_COMMUNITY)
Admission: RE | Disposition: A | Discharge status: Home / Self Care | Payer: Self-pay | Source: Home / Self Care | Attending: Thoracic Surgery (Cardiothoracic Vascular Surgery)

## 2023-10-30 ENCOUNTER — Inpatient Hospital Stay (HOSPITAL_COMMUNITY)
Admission: RE | Admit: 2023-10-30 | Discharge: 2023-11-03 | DRG: 220 | Disposition: A | Discharge status: Home / Self Care | Payer: Medicare PPO | Attending: Thoracic Surgery (Cardiothoracic Vascular Surgery) | Admitting: Thoracic Surgery (Cardiothoracic Vascular Surgery)

## 2023-10-30 DIAGNOSIS — F17210 Nicotine dependence, cigarettes, uncomplicated: Secondary | ICD-10-CM | POA: Diagnosis present

## 2023-10-30 DIAGNOSIS — F418 Other specified anxiety disorders: Secondary | ICD-10-CM | POA: Diagnosis not present

## 2023-10-30 DIAGNOSIS — Z8616 Personal history of COVID-19: Secondary | ICD-10-CM | POA: Diagnosis not present

## 2023-10-30 DIAGNOSIS — I7121 Aneurysm of the ascending aorta, without rupture: Secondary | ICD-10-CM

## 2023-10-30 DIAGNOSIS — I10 Essential (primary) hypertension: Secondary | ICD-10-CM | POA: Diagnosis present

## 2023-10-30 DIAGNOSIS — J90 Pleural effusion, not elsewhere classified: Secondary | ICD-10-CM | POA: Diagnosis not present

## 2023-10-30 DIAGNOSIS — Z823 Family history of stroke: Secondary | ICD-10-CM | POA: Diagnosis not present

## 2023-10-30 DIAGNOSIS — D6959 Other secondary thrombocytopenia: Secondary | ICD-10-CM | POA: Diagnosis not present

## 2023-10-30 DIAGNOSIS — Z800 Family history of malignant neoplasm of digestive organs: Secondary | ICD-10-CM

## 2023-10-30 DIAGNOSIS — Z8249 Family history of ischemic heart disease and other diseases of the circulatory system: Secondary | ICD-10-CM | POA: Diagnosis not present

## 2023-10-30 DIAGNOSIS — I4891 Unspecified atrial fibrillation: Secondary | ICD-10-CM | POA: Diagnosis not present

## 2023-10-30 DIAGNOSIS — Z79899 Other long term (current) drug therapy: Secondary | ICD-10-CM | POA: Diagnosis not present

## 2023-10-30 DIAGNOSIS — I350 Nonrheumatic aortic (valve) stenosis: Secondary | ICD-10-CM

## 2023-10-30 DIAGNOSIS — Z885 Allergy status to narcotic agent status: Secondary | ICD-10-CM | POA: Diagnosis not present

## 2023-10-30 DIAGNOSIS — M1990 Unspecified osteoarthritis, unspecified site: Secondary | ICD-10-CM | POA: Diagnosis present

## 2023-10-30 DIAGNOSIS — I447 Left bundle-branch block, unspecified: Secondary | ICD-10-CM | POA: Diagnosis present

## 2023-10-30 DIAGNOSIS — Z952 Presence of prosthetic heart valve: Secondary | ICD-10-CM | POA: Diagnosis present

## 2023-10-30 DIAGNOSIS — R110 Nausea: Secondary | ICD-10-CM | POA: Diagnosis not present

## 2023-10-30 DIAGNOSIS — I352 Nonrheumatic aortic (valve) stenosis with insufficiency: Secondary | ICD-10-CM | POA: Diagnosis present

## 2023-10-30 DIAGNOSIS — Z91040 Latex allergy status: Secondary | ICD-10-CM | POA: Diagnosis not present

## 2023-10-30 DIAGNOSIS — I422 Other hypertrophic cardiomyopathy: Secondary | ICD-10-CM | POA: Diagnosis not present

## 2023-10-30 DIAGNOSIS — I351 Nonrheumatic aortic (valve) insufficiency: Secondary | ICD-10-CM

## 2023-10-30 DIAGNOSIS — E785 Hyperlipidemia, unspecified: Secondary | ICD-10-CM | POA: Diagnosis present

## 2023-10-30 DIAGNOSIS — I712 Thoracic aortic aneurysm, without rupture, unspecified: Secondary | ICD-10-CM | POA: Diagnosis not present

## 2023-10-30 DIAGNOSIS — J9811 Atelectasis: Secondary | ICD-10-CM | POA: Diagnosis not present

## 2023-10-30 DIAGNOSIS — D62 Acute posthemorrhagic anemia: Secondary | ICD-10-CM | POA: Diagnosis not present

## 2023-10-30 DIAGNOSIS — K219 Gastro-esophageal reflux disease without esophagitis: Secondary | ICD-10-CM | POA: Diagnosis present

## 2023-10-30 HISTORY — PX: THORACIC AORTIC ANEURYSM REPAIR: SHX799

## 2023-10-30 HISTORY — PX: TEE WITHOUT CARDIOVERSION: SHX5443

## 2023-10-30 HISTORY — PX: AORTIC VALVE REPLACEMENT: SHX41

## 2023-10-30 LAB — CBC
HCT: 28.7 % — ABNORMAL LOW (ref 36.0–46.0)
HCT: 26.8 % — ABNORMAL LOW (ref 36.0–46.0)
Hemoglobin: 9.6 g/dL — ABNORMAL LOW (ref 12.0–15.0)
Hemoglobin: 9.1 g/dL — ABNORMAL LOW (ref 12.0–15.0)
MCH: 31.6 pg (ref 26.0–34.0)
MCH: 31.7 pg (ref 26.0–34.0)
MCHC: 33.4 g/dL (ref 30.0–36.0)
MCHC: 34 g/dL (ref 30.0–36.0)
MCV: 94.4 fL (ref 80.0–100.0)
MCV: 93.4 fL (ref 80.0–100.0)
Platelets: 86 10*3/uL — ABNORMAL LOW (ref 150–400)
Platelets: 70 10*3/uL — ABNORMAL LOW (ref 150–400)
RBC: 3.04 MIL/uL — ABNORMAL LOW (ref 3.87–5.11)
RBC: 2.87 MIL/uL — ABNORMAL LOW (ref 3.87–5.11)
RDW: 12.3 % (ref 11.5–15.5)
RDW: 12.3 % (ref 11.5–15.5)
WBC: 12.8 10*3/uL — ABNORMAL HIGH (ref 4.0–10.5)
WBC: 9.4 10*3/uL (ref 4.0–10.5)
nRBC: 0 % (ref 0.0–0.2)
nRBC: 0 % (ref 0.0–0.2)

## 2023-10-30 LAB — BASIC METABOLIC PANEL WITH GFR
Anion gap: 8 (ref 5–15)
BUN: 9 mg/dL (ref 8–23)
CO2: 21 mmol/L — ABNORMAL LOW (ref 22–32)
Calcium: 6.9 mg/dL — ABNORMAL LOW (ref 8.9–10.3)
Chloride: 108 mmol/L (ref 98–111)
Creatinine, Ser: 0.64 mg/dL (ref 0.44–1.00)
GFR, Estimated: >60 mL/min
Glucose, Bld: 116 mg/dL — ABNORMAL HIGH (ref 70–99)
Potassium: 3.7 mmol/L (ref 3.5–5.1)
Sodium: 137 mmol/L (ref 135–145)

## 2023-10-30 LAB — POCT I-STAT, CHEM 8
BUN: 16 mg/dL (ref 8–23)
BUN: 16 mg/dL (ref 8–23)
BUN: 16 mg/dL (ref 8–23)
BUN: 15 mg/dL (ref 8–23)
BUN: 16 mg/dL (ref 8–23)
Calcium, Ion: 1.2 mmol/L (ref 1.15–1.40)
Calcium, Ion: 1.16 mmol/L (ref 1.15–1.40)
Calcium, Ion: 0.96 mmol/L — ABNORMAL LOW (ref 1.15–1.40)
Calcium, Ion: 0.93 mmol/L — ABNORMAL LOW (ref 1.15–1.40)
Calcium, Ion: 1.19 mmol/L (ref 1.15–1.40)
Chloride: 108 mmol/L (ref 98–111)
Chloride: 106 mmol/L (ref 98–111)
Chloride: 106 mmol/L (ref 98–111)
Chloride: 105 mmol/L (ref 98–111)
Chloride: 108 mmol/L (ref 98–111)
Creatinine, Ser: 0.6 mg/dL (ref 0.44–1.00)
Creatinine, Ser: 0.5 mg/dL (ref 0.44–1.00)
Creatinine, Ser: 0.5 mg/dL (ref 0.44–1.00)
Creatinine, Ser: 0.5 mg/dL (ref 0.44–1.00)
Creatinine, Ser: 0.5 mg/dL (ref 0.44–1.00)
Glucose, Bld: 117 mg/dL — ABNORMAL HIGH (ref 70–99)
Glucose, Bld: 147 mg/dL — ABNORMAL HIGH (ref 70–99)
Glucose, Bld: 131 mg/dL — ABNORMAL HIGH (ref 70–99)
Glucose, Bld: 121 mg/dL — ABNORMAL HIGH (ref 70–99)
Glucose, Bld: 166 mg/dL — ABNORMAL HIGH (ref 70–99)
HCT: 33 % — ABNORMAL LOW (ref 36.0–46.0)
HCT: 31 % — ABNORMAL LOW (ref 36.0–46.0)
HCT: 20 % — ABNORMAL LOW (ref 36.0–46.0)
HCT: 18 % — ABNORMAL LOW (ref 36.0–46.0)
HCT: 20 % — ABNORMAL LOW (ref 36.0–46.0)
Hemoglobin: 11.2 g/dL — ABNORMAL LOW (ref 12.0–15.0)
Hemoglobin: 10.5 g/dL — ABNORMAL LOW (ref 12.0–15.0)
Hemoglobin: 6.8 g/dL — CL (ref 12.0–15.0)
Hemoglobin: 6.1 g/dL — CL (ref 12.0–15.0)
Hemoglobin: 6.8 g/dL — CL (ref 12.0–15.0)
Potassium: 3.6 mmol/L (ref 3.5–5.1)
Potassium: 3.6 mmol/L (ref 3.5–5.1)
Potassium: 4.6 mmol/L (ref 3.5–5.1)
Potassium: 4.2 mmol/L (ref 3.5–5.1)
Potassium: 4.8 mmol/L (ref 3.5–5.1)
Sodium: 142 mmol/L (ref 135–145)
Sodium: 140 mmol/L (ref 135–145)
Sodium: 141 mmol/L (ref 135–145)
Sodium: 139 mmol/L (ref 135–145)
Sodium: 139 mmol/L (ref 135–145)
TCO2: 22 mmol/L (ref 22–32)
TCO2: 21 mmol/L — ABNORMAL LOW (ref 22–32)
TCO2: 26 mmol/L (ref 22–32)
TCO2: 24 mmol/L (ref 22–32)
TCO2: 26 mmol/L (ref 22–32)

## 2023-10-30 LAB — POCT I-STAT 7, (LYTES, BLD GAS, ICA,H+H)
Acid-base deficit: 7 mmol/L — ABNORMAL HIGH (ref 0.0–2.0)
Acid-base deficit: 5 mmol/L — ABNORMAL HIGH (ref 0.0–2.0)
Acid-base deficit: 2 mmol/L (ref 0.0–2.0)
Acid-base deficit: 4 mmol/L — ABNORMAL HIGH (ref 0.0–2.0)
Acid-base deficit: 7 mmol/L — ABNORMAL HIGH (ref 0.0–2.0)
Acid-base deficit: 6 mmol/L — ABNORMAL HIGH (ref 0.0–2.0)
Bicarbonate: 18.9 mmol/L — ABNORMAL LOW (ref 20.0–28.0)
Bicarbonate: 20.5 mmol/L (ref 20.0–28.0)
Bicarbonate: 22.4 mmol/L (ref 20.0–28.0)
Bicarbonate: 21.3 mmol/L (ref 20.0–28.0)
Bicarbonate: 19.5 mmol/L — ABNORMAL LOW (ref 20.0–28.0)
Bicarbonate: 19.9 mmol/L — ABNORMAL LOW (ref 20.0–28.0)
Calcium, Ion: 1.19 mmol/L (ref 1.15–1.40)
Calcium, Ion: 0.87 mmol/L — CL (ref 1.15–1.40)
Calcium, Ion: 0.98 mmol/L — ABNORMAL LOW (ref 1.15–1.40)
Calcium, Ion: 1.2 mmol/L (ref 1.15–1.40)
Calcium, Ion: 1.16 mmol/L (ref 1.15–1.40)
Calcium, Ion: 1.06 mmol/L — ABNORMAL LOW (ref 1.15–1.40)
HCT: 34 % — ABNORMAL LOW (ref 36.0–46.0)
HCT: 20 % — ABNORMAL LOW (ref 36.0–46.0)
HCT: 20 % — ABNORMAL LOW (ref 36.0–46.0)
HCT: 18 % — ABNORMAL LOW (ref 36.0–46.0)
HCT: 27 % — ABNORMAL LOW (ref 36.0–46.0)
HCT: 25 % — ABNORMAL LOW (ref 36.0–46.0)
Hemoglobin: 11.6 g/dL — ABNORMAL LOW (ref 12.0–15.0)
Hemoglobin: 6.8 g/dL — CL (ref 12.0–15.0)
Hemoglobin: 6.8 g/dL — CL (ref 12.0–15.0)
Hemoglobin: 6.1 g/dL — CL (ref 12.0–15.0)
Hemoglobin: 9.2 g/dL — ABNORMAL LOW (ref 12.0–15.0)
Hemoglobin: 8.5 g/dL — ABNORMAL LOW (ref 12.0–15.0)
O2 Saturation: 100 %
O2 Saturation: 100 %
O2 Saturation: 100 %
O2 Saturation: 100 %
O2 Saturation: 93 %
O2 Saturation: 95 %
Patient temperature: 36.2
Patient temperature: 36.6
Potassium: 3.6 mmol/L (ref 3.5–5.1)
Potassium: 3.9 mmol/L (ref 3.5–5.1)
Potassium: 4.4 mmol/L (ref 3.5–5.1)
Potassium: 4.3 mmol/L (ref 3.5–5.1)
Potassium: 4 mmol/L (ref 3.5–5.1)
Potassium: 3.7 mmol/L (ref 3.5–5.1)
Sodium: 142 mmol/L (ref 135–145)
Sodium: 139 mmol/L (ref 135–145)
Sodium: 141 mmol/L (ref 135–145)
Sodium: 141 mmol/L (ref 135–145)
Sodium: 143 mmol/L (ref 135–145)
Sodium: 139 mmol/L (ref 135–145)
TCO2: 20 mmol/L — ABNORMAL LOW (ref 22–32)
TCO2: 22 mmol/L (ref 22–32)
TCO2: 23 mmol/L (ref 22–32)
TCO2: 23 mmol/L (ref 22–32)
TCO2: 21 mmol/L — ABNORMAL LOW (ref 22–32)
TCO2: 21 mmol/L — ABNORMAL LOW (ref 22–32)
pCO2 arterial: 37 mm[Hg] (ref 32–48)
pCO2 arterial: 39.6 mm[Hg] (ref 32–48)
pCO2 arterial: 36.4 mm[Hg] (ref 32–48)
pCO2 arterial: 41.2 mm[Hg] (ref 32–48)
pCO2 arterial: 43 mm[Hg] (ref 32–48)
pCO2 arterial: 37.4 mm[Hg] (ref 32–48)
pH, Arterial: 7.316 — ABNORMAL LOW (ref 7.35–7.45)
pH, Arterial: 7.323 — ABNORMAL LOW (ref 7.35–7.45)
pH, Arterial: 7.397 (ref 7.35–7.45)
pH, Arterial: 7.322 — ABNORMAL LOW (ref 7.35–7.45)
pH, Arterial: 7.26 — ABNORMAL LOW (ref 7.35–7.45)
pH, Arterial: 7.332 — ABNORMAL LOW (ref 7.35–7.45)
pO2, Arterial: 400 mm[Hg] — ABNORMAL HIGH (ref 83–108)
pO2, Arterial: 382 mm[Hg] — ABNORMAL HIGH (ref 83–108)
pO2, Arterial: 299 mm[Hg] — ABNORMAL HIGH (ref 83–108)
pO2, Arterial: 347 mm[Hg] — ABNORMAL HIGH (ref 83–108)
pO2, Arterial: 76 mm[Hg] — ABNORMAL LOW (ref 83–108)
pO2, Arterial: 76 mm[Hg] — ABNORMAL LOW (ref 83–108)

## 2023-10-30 LAB — GLUCOSE, CAPILLARY
Glucose-Capillary: 125 mg/dL — ABNORMAL HIGH (ref 70–99)
Glucose-Capillary: 126 mg/dL — ABNORMAL HIGH (ref 70–99)
Glucose-Capillary: 122 mg/dL — ABNORMAL HIGH (ref 70–99)
Glucose-Capillary: 130 mg/dL — ABNORMAL HIGH (ref 70–99)
Glucose-Capillary: 120 mg/dL — ABNORMAL HIGH (ref 70–99)
Glucose-Capillary: 121 mg/dL — ABNORMAL HIGH (ref 70–99)

## 2023-10-30 LAB — ECHO INTRAOPERATIVE TEE
AR max vel: 1.57 cm2
AV Area VTI: 1.57 cm2
AV Area mean vel: 1.51 cm2
AV Mean grad: 8 mm[Hg]
AV Peak grad: 13.9 mm[Hg]
Ao pk vel: 1.87 m/s
Height: 63 in
P 1/2 time: 238 ms
Weight: 2000 [oz_av]

## 2023-10-30 LAB — PLATELET COUNT: Platelets: 110 10*3/uL — ABNORMAL LOW (ref 150–400)

## 2023-10-30 LAB — POCT I-STAT EG7
Acid-base deficit: 4 mmol/L — ABNORMAL HIGH (ref 0.0–2.0)
Bicarbonate: 21.3 mmol/L (ref 20.0–28.0)
Calcium, Ion: 0.92 mmol/L — ABNORMAL LOW (ref 1.15–1.40)
HCT: 20 % — ABNORMAL LOW (ref 36.0–46.0)
Hemoglobin: 6.8 g/dL — CL (ref 12.0–15.0)
O2 Saturation: 70 %
Potassium: 3.8 mmol/L (ref 3.5–5.1)
Sodium: 141 mmol/L (ref 135–145)
TCO2: 23 mmol/L (ref 22–32)
pCO2, Ven: 40.2 mm[Hg] — ABNORMAL LOW (ref 44–60)
pH, Ven: 7.332 (ref 7.25–7.43)
pO2, Ven: 39 mm[Hg] (ref 32–45)

## 2023-10-30 LAB — MAGNESIUM: Magnesium: 2.8 mg/dL — ABNORMAL HIGH (ref 1.7–2.4)

## 2023-10-30 LAB — APTT: aPTT: 32 s (ref 24–36)

## 2023-10-30 LAB — PROTIME-INR
INR: 1.8 — ABNORMAL HIGH (ref 0.8–1.2)
Prothrombin Time: 20.9 s — ABNORMAL HIGH (ref 11.4–15.2)

## 2023-10-30 LAB — ABO/RH: ABO/RH(D): O POS

## 2023-10-30 LAB — HEMOGLOBIN AND HEMATOCRIT, BLOOD
HCT: 22.2 % — ABNORMAL LOW (ref 36.0–46.0)
Hemoglobin: 7.5 g/dL — ABNORMAL LOW (ref 12.0–15.0)

## 2023-10-30 SURGERY — REPLACEMENT, AORTIC VALVE, OPEN
Anesthesia: General | Site: Chest

## 2023-10-30 MED ORDER — ONDANSETRON HCL 4 MG/2ML IJ SOLN
INTRAMUSCULAR | Status: DC | PRN
  Administered 2023-10-30: 8 mg via INTRAVENOUS

## 2023-10-30 MED ORDER — ORAL CARE MOUTH RINSE: 15.0000 mL | OROMUCOSAL | Status: DC | PRN

## 2023-10-30 MED ORDER — BISACODYL 5 MG PO TBEC
10.0000 mg | DELAYED_RELEASE_TABLET | Freq: Every day | ORAL | Status: DC
  Administered 2023-10-31 – 2023-11-01 (×2): 10 mg via ORAL
  Filled 2023-10-30 (×2): qty 2

## 2023-10-30 MED ORDER — MIDAZOLAM HCL 2 MG/2ML IJ SOLN
INTRAMUSCULAR | Status: AC
  Filled 2023-10-30: qty 2

## 2023-10-30 MED ORDER — SODIUM BICARBONATE 8.4 % IV SOLN
50.0000 meq | Freq: Once | INTRAVENOUS | Status: AC
  Administered 2023-10-30: 50 meq via INTRAVENOUS

## 2023-10-30 MED ORDER — PHENYLEPHRINE 80 MCG/ML (10ML) SYRINGE FOR IV PUSH (FOR BLOOD PRESSURE SUPPORT)
PREFILLED_SYRINGE | INTRAVENOUS | Status: AC
  Filled 2023-10-30: qty 10

## 2023-10-30 MED ORDER — ROCURONIUM BROMIDE 10 MG/ML (PF) SYRINGE
PREFILLED_SYRINGE | INTRAVENOUS | Status: DC | PRN
Start: 2023-10-30 — End: 2023-10-30
  Administered 2023-10-30: 100 mg via INTRAVENOUS
  Administered 2023-10-30: 20 mg via INTRAVENOUS
  Administered 2023-10-30: 100 mg via INTRAVENOUS

## 2023-10-30 MED ORDER — ASPIRIN 81 MG PO CHEW
324.0000 mg | CHEWABLE_TABLET | Freq: Every day | ORAL | Status: DC
  Administered 2023-11-02: 324 mg
  Filled 2023-10-30: qty 4

## 2023-10-30 MED ORDER — CHLORHEXIDINE GLUCONATE CLOTH 2 % EX PADS
6.0000 | MEDICATED_PAD | Freq: Every day | CUTANEOUS | Status: DC
  Administered 2023-10-31 – 2023-11-01 (×2): 6 via TOPICAL

## 2023-10-30 MED ORDER — VASOPRESSIN 20 UNIT/ML IV SOLN
INTRAVENOUS | Status: DC | PRN
  Administered 2023-10-30: 1 [IU] via INTRAVENOUS
  Administered 2023-10-30: .5 [IU] via INTRAVENOUS

## 2023-10-30 MED ORDER — PHENYLEPHRINE 80 MCG/ML (10ML) SYRINGE FOR IV PUSH (FOR BLOOD PRESSURE SUPPORT)
PREFILLED_SYRINGE | INTRAVENOUS | Status: DC | PRN
  Administered 2023-10-30: 80 ug via INTRAVENOUS
  Administered 2023-10-30: 160 ug via INTRAVENOUS

## 2023-10-30 MED ORDER — METOPROLOL TARTRATE 12.5 MG HALF TABLET
12.5000 mg | ORAL_TABLET | Freq: Two times a day (BID) | ORAL | Status: DC
  Administered 2023-10-30: 12.5 mg via ORAL
  Filled 2023-10-30: qty 1

## 2023-10-30 MED ORDER — SODIUM CHLORIDE 0.9 % IV SOLN: 250.0000 mL | INTRAVENOUS | Status: DC

## 2023-10-30 MED ORDER — PROPOFOL 10 MG/ML IV BOLUS
INTRAVENOUS | Status: AC
  Filled 2023-10-30: qty 20

## 2023-10-30 MED ORDER — PROPOFOL 10 MG/ML IV BOLUS
INTRAVENOUS | Status: DC | PRN
  Administered 2023-10-30: 50 mg via INTRAVENOUS

## 2023-10-30 MED ORDER — PANTOPRAZOLE SODIUM 40 MG IV SOLR
40.0000 mg | Freq: Every day | INTRAVENOUS | Status: AC
  Administered 2023-10-30 – 2023-10-31 (×2): 40 mg via INTRAVENOUS
  Filled 2023-10-30 (×2): qty 10

## 2023-10-30 MED ORDER — ACETAMINOPHEN 500 MG PO TABS
1000.0000 mg | ORAL_TABLET | Freq: Four times a day (QID) | ORAL | Status: DC
Start: 2023-10-31 — End: 2023-11-03
  Administered 2023-10-31 – 2023-11-03 (×13): 1000 mg via ORAL
  Filled 2023-10-30 (×14): qty 2

## 2023-10-30 MED ORDER — POTASSIUM CHLORIDE 10 MEQ/50ML IV SOLN: 10.0000 meq | INTRAVENOUS | Status: AC

## 2023-10-30 MED ORDER — PROTAMINE SULFATE 10 MG/ML IV SOLN
INTRAVENOUS | Status: DC | PRN
  Administered 2023-10-30: 100 mg via INTRAVENOUS
  Administered 2023-10-30: 30 mg via INTRAVENOUS
  Administered 2023-10-30: 90 mg via INTRAVENOUS

## 2023-10-30 MED ORDER — EPINEPHRINE HCL 5 MG/250ML IV SOLN IN NS: 0.0000 ug/min | INTRAVENOUS | Status: DC

## 2023-10-30 MED ORDER — LACTATED RINGERS IV SOLN: INTRAVENOUS | Status: AC

## 2023-10-30 MED ORDER — ALBUMIN HUMAN 5 % IV SOLN
250.0000 mL | INTRAVENOUS | Status: DC | PRN
  Administered 2023-10-30 (×3): 12.5 g via INTRAVENOUS
  Filled 2023-10-30: qty 250

## 2023-10-30 MED ORDER — INSULIN REGULAR(HUMAN) IN NACL 100-0.9 UT/100ML-% IV SOLN: INTRAVENOUS | Status: DC

## 2023-10-30 MED ORDER — PLASMA-LYTE A IV SOLN: INTRAVENOUS | Status: DC | PRN

## 2023-10-30 MED ORDER — SUGAMMADEX SODIUM 200 MG/2ML IV SOLN
INTRAVENOUS | Status: AC
  Filled 2023-10-30: qty 2

## 2023-10-30 MED ORDER — VANCOMYCIN HCL IN DEXTROSE 1-5 GM/200ML-% IV SOLN
1000.0000 mg | Freq: Once | INTRAVENOUS | Status: AC
  Administered 2023-10-30: 1000 mg via INTRAVENOUS
  Filled 2023-10-30: qty 200

## 2023-10-30 MED ORDER — ORAL CARE MOUTH RINSE: 15.0000 mL | Freq: Once | OROMUCOSAL | Status: DC

## 2023-10-30 MED ORDER — FENTANYL CITRATE (PF) 250 MCG/5ML IJ SOLN
INTRAMUSCULAR | Status: DC | PRN
  Administered 2023-10-30 (×3): 100 ug via INTRAVENOUS
  Administered 2023-10-30: 50 ug via INTRAVENOUS

## 2023-10-30 MED ORDER — METOPROLOL TARTRATE 25 MG/10 ML ORAL SUSPENSION
12.5000 mg | Freq: Two times a day (BID) | ORAL | Status: DC
Start: 2023-10-30 — End: 2023-11-01

## 2023-10-30 MED ORDER — CHLORHEXIDINE GLUCONATE 0.12 % MT SOLN
15.0000 mL | Freq: Once | OROMUCOSAL | Status: DC
  Filled 2023-10-30: qty 15

## 2023-10-30 MED ORDER — DEXTROSE 50 % IV SOLN: 0.0000 mL | INTRAVENOUS | Status: DC | PRN

## 2023-10-30 MED ORDER — MORPHINE SULFATE (PF) 2 MG/ML IV SOLN
1.0000 mg | INTRAVENOUS | Status: DC | PRN
  Administered 2023-10-30: 2 mg via INTRAVENOUS
  Administered 2023-10-30 (×2): 1 mg via INTRAVENOUS
  Administered 2023-10-31 – 2023-11-01 (×6): 2 mg via INTRAVENOUS
  Filled 2023-10-30 (×8): qty 1

## 2023-10-30 MED ORDER — VASOPRESSIN 20 UNIT/ML IV SOLN
INTRAVENOUS | Status: AC
  Filled 2023-10-30: qty 1

## 2023-10-30 MED ORDER — CALCIUM CHLORIDE 10 % IV SOLN
INTRAVENOUS | Status: DC | PRN
  Administered 2023-10-30 (×4): 200 mg via INTRAVENOUS

## 2023-10-30 MED ORDER — PANTOPRAZOLE SODIUM 40 MG PO TBEC
40.0000 mg | DELAYED_RELEASE_TABLET | Freq: Two times a day (BID) | ORAL | Status: DC
Start: 2023-11-01 — End: 2023-11-03
  Administered 2023-11-01 – 2023-11-03 (×4): 40 mg via ORAL
  Filled 2023-10-30 (×5): qty 1

## 2023-10-30 MED ORDER — SODIUM CHLORIDE (PF) 0.9 % IJ SOLN: OROMUCOSAL | Status: DC | PRN

## 2023-10-30 MED ORDER — OXYCODONE HCL 5 MG PO TABS
5.0000 mg | ORAL_TABLET | ORAL | Status: DC | PRN
  Administered 2023-10-30 – 2023-11-01 (×5): 5 mg via ORAL
  Filled 2023-10-30 (×5): qty 1

## 2023-10-30 MED ORDER — ROSUVASTATIN CALCIUM 5 MG PO TABS
10.0000 mg | ORAL_TABLET | Freq: Every day | ORAL | Status: DC
  Administered 2023-10-31 – 2023-11-03 (×4): 10 mg via ORAL
  Filled 2023-10-30 (×4): qty 2

## 2023-10-30 MED ORDER — ACETAMINOPHEN 160 MG/5ML PO SOLN
1000.0000 mg | Freq: Four times a day (QID) | ORAL | Status: DC
Start: 2023-10-31 — End: 2023-11-03

## 2023-10-30 MED ORDER — NITROGLYCERIN IN D5W 200-5 MCG/ML-% IV SOLN: 0.0000 ug/min | INTRAVENOUS | Status: DC

## 2023-10-30 MED ORDER — CHLORHEXIDINE GLUCONATE 4 % EX SOLN
30.0000 mL | CUTANEOUS | Status: DC
Start: 2023-10-30 — End: 2023-10-30

## 2023-10-30 MED ORDER — LACTATED RINGERS IV SOLN: INTRAVENOUS | Status: DC

## 2023-10-30 MED ORDER — TRAMADOL HCL 50 MG PO TABS
50.0000 mg | ORAL_TABLET | ORAL | Status: DC | PRN
  Administered 2023-10-30: 50 mg via ORAL
  Filled 2023-10-30: qty 1

## 2023-10-30 MED ORDER — SODIUM CHLORIDE 0.9% FLUSH: 3.0000 mL | INTRAVENOUS | Status: DC | PRN

## 2023-10-30 MED ORDER — MAGNESIUM SULFATE 4 GM/100ML IV SOLN
4.0000 g | Freq: Once | INTRAVENOUS | Status: AC
  Administered 2023-10-30: 4 g via INTRAVENOUS
  Filled 2023-10-30: qty 100

## 2023-10-30 MED ORDER — MIDAZOLAM HCL 2 MG/2ML IJ SOLN: 2.0000 mg | INTRAMUSCULAR | Status: DC | PRN

## 2023-10-30 MED ORDER — CHLORHEXIDINE GLUCONATE 0.12 % MT SOLN
15.0000 mL | OROMUCOSAL | Status: AC
  Administered 2023-10-30: 15 mL via OROMUCOSAL
  Filled 2023-10-30: qty 15

## 2023-10-30 MED ORDER — HEMOSTATIC AGENTS (NO CHARGE) OPTIME
TOPICAL | Status: DC | PRN
Start: 2023-10-30 — End: 2023-10-30
  Administered 2023-10-30: 1 via TOPICAL

## 2023-10-30 MED ORDER — SODIUM CHLORIDE 0.9 % IV SOLN: INTRAVENOUS | Status: DC | PRN

## 2023-10-30 MED ORDER — ASPIRIN 325 MG PO TBEC
325.0000 mg | DELAYED_RELEASE_TABLET | Freq: Every day | ORAL | Status: DC
  Administered 2023-10-31 – 2023-11-03 (×3): 325 mg via ORAL
  Filled 2023-10-30 (×3): qty 1

## 2023-10-30 MED ORDER — ONDANSETRON HCL 4 MG/2ML IJ SOLN
4.0000 mg | Freq: Four times a day (QID) | INTRAMUSCULAR | Status: DC | PRN
  Administered 2023-10-30 – 2023-11-01 (×3): 4 mg via INTRAVENOUS
  Filled 2023-10-30 (×3): qty 2

## 2023-10-30 MED ORDER — DEXMEDETOMIDINE HCL IN NACL 400 MCG/100ML IV SOLN: 0.0000 ug/kg/h | INTRAVENOUS | Status: DC

## 2023-10-30 MED ORDER — METOCLOPRAMIDE HCL 5 MG/ML IJ SOLN
10.0000 mg | Freq: Four times a day (QID) | INTRAMUSCULAR | Status: AC
  Administered 2023-10-30 – 2023-10-31 (×6): 10 mg via INTRAVENOUS
  Filled 2023-10-30 (×6): qty 2

## 2023-10-30 MED ORDER — 0.9 % SODIUM CHLORIDE (POUR BTL) OPTIME
TOPICAL | Status: DC | PRN
  Administered 2023-10-30: 5000 mL

## 2023-10-30 MED ORDER — EPHEDRINE SULFATE-NACL 50-0.9 MG/10ML-% IV SOSY
PREFILLED_SYRINGE | INTRAVENOUS | Status: DC | PRN
  Administered 2023-10-30: 10 mg via INTRAVENOUS

## 2023-10-30 MED ORDER — ACETAMINOPHEN 160 MG/5ML PO SOLN
650.0000 mg | Freq: Once | ORAL | Status: AC
  Administered 2023-10-30: 650 mg
  Filled 2023-10-30: qty 20.3

## 2023-10-30 MED ORDER — METOPROLOL TARTRATE 5 MG/5ML IV SOLN
2.5000 mg | INTRAVENOUS | Status: DC | PRN
  Administered 2023-10-30: 5 mg via INTRAVENOUS
  Filled 2023-10-30: qty 5

## 2023-10-30 MED ORDER — MIRTAZAPINE 15 MG PO TABS
45.0000 mg | ORAL_TABLET | Freq: Every day | ORAL | Status: DC
  Administered 2023-10-30 – 2023-11-01 (×3): 45 mg via ORAL
  Filled 2023-10-30: qty 1
  Filled 2023-10-30: qty 3
  Filled 2023-10-30: qty 1
  Filled 2023-10-30: qty 3

## 2023-10-30 MED ORDER — METOPROLOL TARTRATE 12.5 MG HALF TABLET: 12.5000 mg | ORAL_TABLET | Freq: Once | ORAL | Status: DC

## 2023-10-30 MED ORDER — CHLORHEXIDINE GLUCONATE 0.12 % MT SOLN
15.0000 mL | Freq: Once | OROMUCOSAL | Status: AC
  Administered 2023-10-30: 15 mL via OROMUCOSAL

## 2023-10-30 MED ORDER — CEFAZOLIN SODIUM-DEXTROSE 2-4 GM/100ML-% IV SOLN
2.0000 g | Freq: Three times a day (TID) | INTRAVENOUS | Status: AC
  Administered 2023-10-30 – 2023-11-01 (×6): 2 g via INTRAVENOUS
  Filled 2023-10-30 (×6): qty 100

## 2023-10-30 MED ORDER — BISACODYL 10 MG RE SUPP: 10.0000 mg | Freq: Every day | RECTAL | Status: DC

## 2023-10-30 MED ORDER — SUVOREXANT 20 MG PO TABS: 20.0000 mg | ORAL_TABLET | Freq: Every day | ORAL | Status: DC

## 2023-10-30 MED ORDER — LIDOCAINE 2% (20 MG/ML) 5 ML SYRINGE
INTRAMUSCULAR | Status: AC
  Filled 2023-10-30: qty 5

## 2023-10-30 MED ORDER — NOREPINEPHRINE 4 MG/250ML-% IV SOLN: 0.0000 ug/min | INTRAVENOUS | Status: DC

## 2023-10-30 MED ORDER — NITROGLYCERIN 0.2 MG/ML ON CALL CATH LAB
INTRAVENOUS | Status: DC | PRN
  Administered 2023-10-30: 10 ug via INTRAVENOUS

## 2023-10-30 MED ORDER — NOREPINEPHRINE BITARTRATE 1 MG/ML IV SOLN
INTRAVENOUS | Status: DC | PRN
  Administered 2023-10-30 (×2): .5 mL via INTRAVENOUS
  Administered 2023-10-30: 1 mL via INTRAVENOUS

## 2023-10-30 MED ORDER — SODIUM CHLORIDE 0.9% FLUSH
3.0000 mL | Freq: Two times a day (BID) | INTRAVENOUS | Status: DC
  Administered 2023-10-31 – 2023-11-01 (×3): 3 mL via INTRAVENOUS

## 2023-10-30 MED ORDER — FENTANYL CITRATE (PF) 250 MCG/5ML IJ SOLN
INTRAMUSCULAR | Status: AC
  Filled 2023-10-30: qty 5

## 2023-10-30 MED ORDER — POTASSIUM CHLORIDE CRYS ER 20 MEQ PO TBCR
20.0000 meq | EXTENDED_RELEASE_TABLET | ORAL | Status: AC
  Administered 2023-10-30 – 2023-10-31 (×3): 20 meq via ORAL
  Filled 2023-10-30 (×3): qty 1

## 2023-10-30 MED ORDER — ~~LOC~~ CARDIAC SURGERY, PATIENT & FAMILY EDUCATION
Freq: Once | Status: DC
  Filled 2023-10-30: qty 1

## 2023-10-30 MED ORDER — SUGAMMADEX SODIUM 200 MG/2ML IV SOLN
INTRAVENOUS | Status: DC | PRN
  Administered 2023-10-30: 200 mg via INTRAVENOUS

## 2023-10-30 MED ORDER — ALBUMIN HUMAN 5 % IV SOLN: INTRAVENOUS | Status: DC | PRN

## 2023-10-30 MED ORDER — DOCUSATE SODIUM 100 MG PO CAPS
200.0000 mg | ORAL_CAPSULE | Freq: Every day | ORAL | Status: DC
  Administered 2023-10-31 – 2023-11-03 (×3): 200 mg via ORAL
  Filled 2023-10-30 (×4): qty 2

## 2023-10-30 MED ORDER — SODIUM CHLORIDE 0.45 % IV SOLN: INTRAVENOUS | Status: AC | PRN

## 2023-10-30 MED ORDER — PHENYLEPHRINE HCL-NACL 20-0.9 MG/250ML-% IV SOLN: 0.0000 ug/min | INTRAVENOUS | Status: DC

## 2023-10-30 MED ORDER — MIDAZOLAM HCL 2 MG/2ML IJ SOLN
INTRAMUSCULAR | Status: DC | PRN
  Administered 2023-10-30: 1 mg via INTRAVENOUS
  Administered 2023-10-30: 2 mg via INTRAVENOUS
  Administered 2023-10-30: 1 mg via INTRAVENOUS

## 2023-10-30 MED ORDER — ONDANSETRON HCL 4 MG/2ML IJ SOLN
INTRAMUSCULAR | Status: AC
  Filled 2023-10-30: qty 4

## 2023-10-30 MED ORDER — SODIUM CHLORIDE 0.9 % IV SOLN: INTRAVENOUS | Status: AC

## 2023-10-30 MED ORDER — HEPARIN SODIUM (PORCINE) 1000 UNIT/ML IJ SOLN
INTRAMUSCULAR | Status: DC | PRN
  Administered 2023-10-30: 19000 [IU] via INTRAVENOUS

## 2023-10-30 MED ORDER — EPHEDRINE 5 MG/ML INJ
INTRAVENOUS | Status: AC
  Filled 2023-10-30: qty 5

## 2023-10-30 MED ORDER — LIDOCAINE 2% (20 MG/ML) 5 ML SYRINGE
INTRAMUSCULAR | Status: DC | PRN
  Administered 2023-10-30: 100 mg via INTRAVENOUS

## 2023-10-30 MED ORDER — ASPIRIN 81 MG PO CHEW
324.0000 mg | CHEWABLE_TABLET | Freq: Once | ORAL | Status: AC
  Administered 2023-10-30: 324 mg via ORAL
  Filled 2023-10-30: qty 4

## 2023-10-30 MED ORDER — ROCURONIUM BROMIDE 10 MG/ML (PF) SYRINGE
PREFILLED_SYRINGE | INTRAVENOUS | Status: AC
  Filled 2023-10-30: qty 10

## 2023-10-30 MED ORDER — BUPROPION HCL ER (XL) 300 MG PO TB24
300.0000 mg | ORAL_TABLET | ORAL | Status: DC
  Administered 2023-10-31 – 2023-11-03 (×4): 300 mg via ORAL
  Filled 2023-10-30 (×4): qty 1

## 2023-10-30 SURGICAL SUPPLY — 121 items
ADAPTER CARDIO PERF ANTE/RETRO (ADAPTER) ×2 IMPLANT
APPLICATOR COTTON TIP 6 STRL (MISCELLANEOUS) IMPLANT
APPLICATOR COTTON TIP 6IN STRL (MISCELLANEOUS) IMPLANT
BAG DECANTER FOR FLEXI CONT (MISCELLANEOUS) ×2 IMPLANT
BENZOIN TINCTURE PRP APPL 2/3 (GAUZE/BANDAGES/DRESSINGS) IMPLANT
BLADE CLIPPER SURG (BLADE) ×2 IMPLANT
BLADE STERNUM SYSTEM 6 (BLADE) ×2 IMPLANT
BLADE SURG 11 STRL SS (BLADE) IMPLANT
BLADE SURG 15 STRL LF DISP TIS (BLADE) ×2 IMPLANT
CANISTER SUCT 3000ML PPV (MISCELLANEOUS) ×2 IMPLANT
CANN PRFSN .5XCNCT 15X34-48 (MISCELLANEOUS) IMPLANT
CANNULA EZ GLIDE AORTIC 21FR (CANNULA) ×2 IMPLANT
CANNULA GUNDRY RCSP 15FR (MISCELLANEOUS) ×2 IMPLANT
CANNULA MC2 2 STG 36/46 CONN (CANNULA) IMPLANT
CANNULA PRFSN .5XCNCT 15X34-48 (MISCELLANEOUS) IMPLANT
CATH CPB KIT HENDRICKSON (MISCELLANEOUS) ×2 IMPLANT
CATH HEART VENT LEFT (CATHETERS) ×2 IMPLANT
CATH ROBINSON RED A/P 18FR (CATHETERS) ×4 IMPLANT
CATH THORACIC 28FR (CATHETERS) IMPLANT
CATH THORACIC 36FR (CATHETERS) IMPLANT
CATH THORACIC 36FR RT ANG (CATHETERS) ×4 IMPLANT
CAUTERY EYE LOW TEMP 1300F FIN (OPHTHALMIC RELATED) ×2 IMPLANT
CAUTERY HI TEMP FINE TIP 2 (MISCELLANEOUS) IMPLANT
CLIP FOGARTY SPRING 6M (CLIP) IMPLANT
CLIP TI MEDIUM 6 (CLIP) ×2 IMPLANT
CLIP TI WIDE RED SMALL 24 (CLIP) IMPLANT
CNTNR URN SCR LID CUP LEK RST (MISCELLANEOUS) IMPLANT
CONN 3/8X3/8 GISH STERILE (MISCELLANEOUS) IMPLANT
CONN Y 3/8X3/8X3/8 BEN (MISCELLANEOUS) IMPLANT
CONTAINER PROTECT SURGISLUSH (MISCELLANEOUS) ×4 IMPLANT
DEVICE SUT CK QUICK LOAD MINI (Prosthesis & Implant Heart) IMPLANT
DRAIN CHANNEL 28F RND 3/8 FF (WOUND CARE) IMPLANT
DRAPE SRG 135X102X78XABS (DRAPES) ×2 IMPLANT
DRAPE WARM FLUID 44X44 (DRAPES) ×2 IMPLANT
DRSG AQUACEL AG ADV 3.5X10 (GAUZE/BANDAGES/DRESSINGS) IMPLANT
DRSG COVADERM 4X14 (GAUZE/BANDAGES/DRESSINGS) ×2 IMPLANT
ELECT REM PT RETURN 9FT ADLT (ELECTROSURGICAL) ×4 IMPLANT
ELECTRODE REM PT RTRN 9FT ADLT (ELECTROSURGICAL) ×4 IMPLANT
FELT TEFLON 1X6 (MISCELLANEOUS) ×4 IMPLANT
FELT TEFLON 6X6 (MISCELLANEOUS) IMPLANT
GAUZE 4X4 16PLY ~~LOC~~+RFID DBL (SPONGE) ×2 IMPLANT
GAUZE SPONGE 4X4 12PLY STRL (GAUZE/BANDAGES/DRESSINGS) ×4 IMPLANT
GAUZE SPONGE 4X4 12PLY STRL LF (GAUZE/BANDAGES/DRESSINGS) IMPLANT
GLOVE BIOGEL PI IND STRL 7.5 (GLOVE) IMPLANT
GLOVE SS BIOGEL STRL SZ 7.5 (GLOVE) ×2 IMPLANT
GLOVE SURG SIGNA 7.5 PF LTX (GLOVE) ×6 IMPLANT
GLOVE SURG SS PI 6.0 STRL IVOR (GLOVE) IMPLANT
GLOVE SURG SS PI 7.5 STRL IVOR (GLOVE) IMPLANT
GOWN STRL REUS W/ TWL LRG LVL3 (GOWN DISPOSABLE) ×8 IMPLANT
GOWN STRL REUS W/ TWL XL LVL3 (GOWN DISPOSABLE) ×2 IMPLANT
GRAFT WOVEN D/V 30DX30L (Vascular Products) IMPLANT
HEMOSTAT POWDER SURGIFOAM 1G (HEMOSTASIS) ×6 IMPLANT
HEMOSTAT SURGICEL 2X14 (HEMOSTASIS) ×2 IMPLANT
INSERT FOGARTY SM (MISCELLANEOUS) ×2 IMPLANT
INSERT FOGARTY XLG (MISCELLANEOUS) IMPLANT
KIT BASIN OR (CUSTOM PROCEDURE TRAY) ×2 IMPLANT
KIT SUCTION CATH 14FR (SUCTIONS) ×4 IMPLANT
KIT SUT CK MINI COMBO 4X17 (Prosthesis & Implant Heart) IMPLANT
KIT TURNOVER KIT B (KITS) ×2 IMPLANT
LINE VENT (MISCELLANEOUS) IMPLANT
NDL AORTIC AIR ASPIRATING (NEEDLE) IMPLANT
NEEDLE AORTIC AIR ASPIRATING (NEEDLE) ×2 IMPLANT
NS IRRIG 1000ML POUR BTL (IV SOLUTION) ×10 IMPLANT
PACK E OPEN HEART (SUTURE) ×2 IMPLANT
PACK OPEN HEART (CUSTOM PROCEDURE TRAY) ×2 IMPLANT
PAD ARMBOARD 7.5X6 YLW CONV (MISCELLANEOUS) ×4 IMPLANT
POSITIONER HEAD DONUT 9IN (MISCELLANEOUS) ×2 IMPLANT
SEALANT SURG COSEAL 8ML (VASCULAR PRODUCTS) ×2 IMPLANT
SET MPS 3-ND DEL (MISCELLANEOUS) IMPLANT
SPONGE T-LAP 18X18 ~~LOC~~+RFID (SPONGE) ×8 IMPLANT
SPONGE T-LAP 4X18 ~~LOC~~+RFID (SPONGE) ×2 IMPLANT
STAPLER VISISTAT 35W (STAPLE) ×2 IMPLANT
STOPCOCK 4 WAY LG BORE MALE ST (IV SETS) IMPLANT
SUT BONE WAX W31G (SUTURE) ×2 IMPLANT
SUT EB EXC GRN/WHT 2-0 V-5 (SUTURE) ×4 IMPLANT
SUT ETHIBON EXCEL 2-0 V-5 (SUTURE) IMPLANT
SUT ETHIBOND 2 0 SH 36X2 (SUTURE) ×2 IMPLANT
SUT ETHIBOND 2 0 V4 (SUTURE) IMPLANT
SUT ETHIBOND 2 0V4 GREEN (SUTURE) IMPLANT
SUT ETHIBOND 4 0 RB 1 (SUTURE) IMPLANT
SUT ETHIBOND V-5 VALVE (SUTURE) IMPLANT
SUT MNCRL AB 3-0 PS2 18 (SUTURE) IMPLANT
SUT PROLENE 3 0 RB 1 (SUTURE) ×2 IMPLANT
SUT PROLENE 3 0 SH 1 (SUTURE) IMPLANT
SUT PROLENE 3 0 SH DA (SUTURE) ×2 IMPLANT
SUT PROLENE 4 0 SH DA (SUTURE) IMPLANT
SUT PROLENE 4-0 RB1 .5 CRCL 36 (SUTURE) ×4 IMPLANT
SUT PROLENE 5 0 C 1 36 (SUTURE) IMPLANT
SUT PROLENE 6 0 CC (SUTURE) IMPLANT
SUT PROLENE 7 0 BV 1 (SUTURE) IMPLANT
SUT PROLENE 7 0 BV1 MDA (SUTURE) IMPLANT
SUT SILK 1 MH (SUTURE) ×2 IMPLANT
SUT STEEL 6MS V (SUTURE) IMPLANT
SUT STEEL STERNAL CCS#1 18IN (SUTURE) IMPLANT
SUT STEEL SZ 6 DBL 3X14 BALL (SUTURE) IMPLANT
SUT VIC AB 1 CT1 18XCR BRD 8 (SUTURE) IMPLANT
SUT VIC AB 1 CTX 27 (SUTURE) ×4 IMPLANT
SUT VIC AB 1 CTX36XBRD ANBCTR (SUTURE) ×4 IMPLANT
SUT VIC AB 2-0 CT1 TAPERPNT 27 (SUTURE) IMPLANT
SUT VIC AB 2-0 CTX 27 (SUTURE) IMPLANT
SUT VIC AB 2-0 CTX 36 (SUTURE) ×4 IMPLANT
SUT VIC AB 3-0 SH 27X BRD (SUTURE) IMPLANT
SUT VIC AB 3-0 X1 27 (SUTURE) ×4 IMPLANT
SUT VICRYL 4-0 PS2 18IN ABS (SUTURE) IMPLANT
SYR 10ML KIT SKIN ADHESIVE (MISCELLANEOUS) IMPLANT
SYSTEM SAHARA CHEST DRAIN ATS (WOUND CARE) ×2 IMPLANT
TAPE CLOTH SURG 4X10 WHT LF (GAUZE/BANDAGES/DRESSINGS) IMPLANT
TAPE PAPER 2X10 WHT MICROPORE (GAUZE/BANDAGES/DRESSINGS) IMPLANT
TOWEL GREEN STERILE (TOWEL DISPOSABLE) ×2 IMPLANT
TOWEL GREEN STERILE FF (TOWEL DISPOSABLE) ×2 IMPLANT
TRAY CATH LUMEN 1 20CM STRL (SET/KITS/TRAYS/PACK) IMPLANT
TRAY FOL W/BAG SLVR 16FR STRL (SET/KITS/TRAYS/PACK) IMPLANT
TRAY FOLEY SLVR 14FR TEMP STAT (SET/KITS/TRAYS/PACK) ×2 IMPLANT
TRAY FOLEY SLVR 16FR TEMP STAT (SET/KITS/TRAYS/PACK) ×2 IMPLANT
TRAY FOLEY W/BAG SLVR 14FR LF (SET/KITS/TRAYS/PACK) IMPLANT
TUBE CONNECTING 20X1/4 (TUBING) IMPLANT
UNDERPAD 30X36 HEAVY ABSORB (UNDERPADS AND DIAPERS) ×2 IMPLANT
VALVE AORTIC SZ21 INSP/RESIL (Valve) IMPLANT
VENT LEFT HEART 12002 (CATHETERS) ×2 IMPLANT
WATER STERILE IRR 1000ML POUR (IV SOLUTION) ×4 IMPLANT
YANKAUER SUCT BULB TIP NO VENT (SUCTIONS) IMPLANT

## 2023-10-30 NOTE — Hospital Course (Addendum)
History of Present Illness:  Erika Wu is a 78 year old woman with a past medical history significant for aortic insufficiency, aortic stenosis, ascending aneurysm, hypertension, hyperlipidemia, reflux, anxiety, and depression.  She has been followed for several years for an ascending aneurysm.  It has been stable at 4.5 cm.  Also followed by Dr. Peter Swaziland for her AI.  She was evaluated by Dr. Dorris Fetch at which time she admitted to experiencing some issues with shortness of breath, which has since improved.  She has been feeling fatigued more easily.  Denies any orthopnea, PND, or swelling in her legs.  No chest pain.  She recently had an echocardiogram which again showed severe AI and mild to moderate aortic stenosis.  Ejection fraction was 55 to 60%.  Left ventricle dilated with moderate asymmetric left ventricular hypertrophy.  Grade 1 diastolic dysfunction.  Dr. Swaziland did right and left heart catheterization which showed no significant coronary artery disease and normal left ventricular filling pressures and right ventricular pressures.  He was in agreement the patient would require Aortic Valve Replacement.  He also felt that she should undergo replacement of her ascending aorta with her aneurysm measuring 4.5 cm.  The risks and benefits of the procedure were explained to the patient and she was agreeable to proceed.  Hospital Course:  Erika Wu presented to [facility] on 10/30/2023.  She was taken to the operating room and underwent Aortic Valve Replacement with a 21 mm Edwards Resilia Inspiris Valve and Replacement of Ascending Aorta with a 30 mm Gelweave Valsalva graft.  He tolerated the procedure without difficulty and was taken to the SICU in stable condition.   She was extubated the evening of surgery.  She did not require post operative drip support.  Her EKG showed Sinus Bradycardia with new LBBB.  She completed of nausea and excessive belching.  This was felt to be related  to gastric dilatation and she was treated with Reglan and clear liquid diet.  Her chest tubes were removed without difficulty.

## 2023-10-30 NOTE — Progress Notes (Signed)
Echocardiogram 2D Echocardiogram has been performed.  Erika Wu 10/30/2023, 5:58 PM

## 2023-10-30 NOTE — Interval H&P Note (Signed)
History and Physical Interval Note:  10/30/2023 7:26 AM  Erika Wu  has presented today for surgery, with the diagnosis of SEVERE AI TAA.  The various methods of treatment have been discussed with the patient and family. After consideration of risks, benefits and other options for treatment, the patient has consented to  Procedure(s): AORTIC VALVE REPLACEMENT (AVR) (N/A) THORACIC ASCENDING ANEURYSM REPAIR (AAA) (N/A) TRANSESOPHAGEAL ECHOCARDIOGRAM (TEE) (N/A) as a surgical intervention.  The patient's history has been reviewed, patient examined, no change in status, stable for surgery.  I have reviewed the patient's chart and labs.  Questions were answered to the patient's satisfaction.     [name]

## 2023-10-30 NOTE — Anesthesia Postprocedure Evaluation (Signed)
Anesthesia Post Note  Patient: Erika Wu  Procedure(s) Performed: AORTIC VALVE REPLACEMENT (AVR) USING INSPIRIS AORTIC VALVE SIZE ; SEPTAL MYECTOMY EXCISION (Chest) ASCENDING AORTIC ANEURYSM REPAIR (AAA) USING HEMASHIELD PLATINUM GRAFT SIZE TRANSESOPHAGEAL ECHOCARDIOGRAM (TEE)     Patient location during evaluation: PACU Anesthesia Type: General Level of consciousness: awake and alert Pain management: pain level controlled Vital Signs Assessment: post-procedure vital signs reviewed and stable Respiratory status: spontaneous breathing, nonlabored ventilation, respiratory function stable and patient connected to nasal cannula oxygen Cardiovascular status: blood pressure returned to baseline and stable Postop Assessment: no apparent nausea or vomiting Anesthetic complications: no   No notable events documented.  Last Vitals:  Vitals:   10/30/23 0654  BP: 127/61  Pulse: (!) 58  Resp: 18  Temp: 36.9 C  SpO2: 95%    Last Pain:  Vitals:   10/30/23 0727  TempSrc:   PainSc: 0-No pain                 Goree Nation

## 2023-10-30 NOTE — Brief Op Note (Addendum)
10/30/2023  4:41 PM  PATIENT:  Erika Wu  78 y.o. female  PRE-OPERATIVE DIAGNOSIS:  SEVERE AORTIC INSUFFICIENCY, ASCENDING AORTIC ANEURYSM  POST-OPERATIVE DIAGNOSIS:  SEVERE AORTIC INSUFFICIENCY, ASCENDING AORTIC ANEURYSM  PROCEDURE:  Procedure(s): AORTIC VALVE REPLACEMENT (AVR) USING INSPIRIS AORTIC VALVE SIZE ; SEPTAL MYECTOMY EXCISION (N/A) ASCENDING AORTIC ANEURYSM REPAIR (AAA) USING HEMASHIELD PLATINUM GRAFT SIZE (N/A) TRANSESOPHAGEAL ECHOCARDIOGRAM (TEE) (N/A)  SURGEON:  Surgeons and Role:    * [name], MD - Primary  PHYSICIAN ASSISTANT:   ASSISTANTS: Alfonso Patten, MD   ANESTHESIA: \ general  EBL:  830 mL   BLOOD ADMINISTERED:none  DRAINS:  2 Chest Tube(s) in the mediastinum    LOCAL MEDICATIONS USED:  NONE  SPECIMEN:  Source of Specimen:  aneurysm, aortic valve leaflets, septal myocardium  DISPOSITION OF SPECIMEN:  PATHOLOGY  COUNTS:  YES  TOURNIQUET:  * No tourniquets in log *  DICTATION: .Other Dictation: Dictation Number -  PLAN OF CARE: Admit inpatient  PATIENT DISPOSITION:  ICU, extubated, stable   Delay start of Pharmacological VTE agent (>24hrs) due to surgical blood loss or risk of bleeding: yes

## 2023-10-30 NOTE — Anesthesia Procedure Notes (Signed)
Central Venous Catheter Insertion Performed by: Unalakleet Nation, MD, anesthesiologist Start/End2/19/2025 8:00 AM, 10/30/2023 8:10 AM Patient location: Pre-op. Preanesthetic checklist: patient identified, IV checked, site marked, risks and benefits discussed, surgical consent, monitors and equipment checked, pre-op evaluation, timeout performed and anesthesia consent Lidocaine 1% used for infiltration and patient sedated Hand hygiene performed  and maximum sterile barriers used  Catheter size: 8.5 Fr Total catheter length 16. Central line and PA cath was placed.MAC introducer Swan type:thermodilution Procedure performed using ultrasound guided technique. Ultrasound Notes:anatomy identified, needle tip was noted to be adjacent to the nerve/plexus identified, no ultrasound evidence of intravascular and/or intraneural injection and image(s) printed for medical record Attempts: 1 Following insertion, dressing applied and line sutured. Post procedure assessment: blood return through all ports  Patient tolerated the procedure well with no immediate complications.

## 2023-10-30 NOTE — Transfer of Care (Signed)
Immediate Anesthesia Transfer of Care Note  Patient: Erika Wu  Procedure(s) Performed: AORTIC VALVE REPLACEMENT (AVR) USING INSPIRIS AORTIC VALVE SIZE ; SEPTAL MYECTOMY EXCISION (Chest) ASCENDING AORTIC ANEURYSM REPAIR (AAA) USING HEMASHIELD PLATINUM GRAFT SIZE TRANSESOPHAGEAL ECHOCARDIOGRAM (TEE)  Patient Location: SICU  Anesthesia Type:General  Level of Consciousness: awake, alert , and oriented  Airway & Oxygen Therapy: Patient Spontanous Breathing and Patient connected to nasal cannula oxygen  Post-op Assessment: Report given to RN and Post -op Vital signs reviewed and stable  Post vital signs: Reviewed and stable  Last Vitals:  Vitals Value Taken Time  BP 94/71 10/30/23 1409  Temp 36.2 C 10/30/23 1426  Pulse 80 10/30/23 1426  Resp 20 10/30/23 1426  SpO2 95 % 10/30/23 1426  Vitals shown include unfiled device data.  Last Pain:  Vitals:   10/30/23 0727  TempSrc:   PainSc: 0-No pain         Complications: No notable events documented.

## 2023-10-30 NOTE — Anesthesia Procedure Notes (Signed)
Procedure Name: Intubation Date/Time: 10/30/2023 8:50 AM  Performed by: Darryl Nestle, CRNAPre-anesthesia Checklist: Patient identified, Emergency Drugs available, Suction available and Patient being monitored Patient Re-evaluated:Patient Re-evaluated prior to induction Oxygen Delivery Method: Circle system utilized Preoxygenation: Pre-oxygenation with 100% oxygen Induction Type: IV induction Ventilation: Mask ventilation without difficulty Laryngoscope Size: Mac and 3 Grade View: Grade II Tube type: Oral Tube size: 8.0 mm Number of attempts: 1 Airway Equipment and Method: Stylet and Oral airway Placement Confirmation: ETT inserted through vocal cords under direct vision, positive ETCO2 and breath sounds checked- equal and bilateral Secured at: 23 cm Tube secured with: Tape Dental Injury: Teeth and Oropharynx as per pre-operative assessment

## 2023-10-30 NOTE — Anesthesia Procedure Notes (Signed)
Arterial Line Insertion Start/End2/19/2025 7:45 AM, 10/30/2023 7:50 AM Performed by: Darryl Nestle, CRNA, CRNA  Patient location: Pre-op. Preanesthetic checklist: patient identified, IV checked, site marked, risks and benefits discussed, surgical consent, monitors and equipment checked, pre-op evaluation, timeout performed and anesthesia consent Lidocaine 1% used for infiltration Left, radial was placed Catheter size: 20 G Hand hygiene performed  and maximum sterile barriers used   Attempts: 1 Procedure performed without using ultrasound guided technique. Following insertion, dressing applied and Biopatch. Post procedure assessment: normal and unchanged  Patient tolerated the procedure well with no immediate complications.

## 2023-10-30 NOTE — Progress Notes (Signed)
EVENING ROUNDS NOTE :     [address]       [name] [address]             [phone]                 * Day of Surgery * Procedure(s) (LRB): AORTIC VALVE REPLACEMENT (AVR) USING INSPIRIS AORTIC VALVE SIZE ; SEPTAL MYECTOMY EXCISION (N/A) ASCENDING AORTIC ANEURYSM REPAIR (AAA) USING HEMASHIELD PLATINUM GRAFT SIZE (N/A) TRANSESOPHAGEAL ECHOCARDIOGRAM (TEE) (N/A)   Total Length of Stay:  LOS: 0 days  Events:   No events HD stable Minimal CT output     BP 127/61   Pulse 80   Temp (!) 97.3 F (36.3 C)   Resp 12   Ht 5\' 3"  (1.6 m)   Wt 56.7 kg   SpO2 96%   BMI 22.14 kg/m   PAP: (26-44)/(10-25) 29/12 CO:  [3.4 L/min-4.2 L/min] 3.4 L/min CI:  [2.2 L/min/m2-2.7 L/min/m2] 2.2 L/min/m2      sodium chloride 20 mL/hr at 10/30/23 1800   [START ON 10/31/2023] sodium chloride     sodium chloride 20 mL/hr at 10/30/23 1800   albumin human 999 mL/hr at 10/30/23 1800    ceFAZolin (ANCEF) IV Stopped (10/30/23 1537)   dexmedetomidine (PRECEDEX) IV infusion     epinephrine     insulin 0.8 Units/hr (10/30/23 1800)   lactated ringers Stopped (10/30/23 1505)   lactated ringers 20 mL/hr at 10/30/23 1800   nitroGLYCERIN     norepinephrine (LEVOPHED) Adult infusion Stopped (10/30/23 1553)   phenylephrine (NEO-SYNEPHRINE) Adult infusion     vancomycin      No intake/output data recorded.      Latest Ref Rng & Units 10/30/2023    2:29 PM 10/30/2023    2:26 PM 10/30/2023   12:30 PM  CBC  WBC 4.0 - 10.5 K/uL 12.8     Hemoglobin 12.0 - 15.0 g/dL 9.6  9.2  6.1   Hematocrit 36.0 - 46.0 % 28.7  27.0  18.0   Platelets 150 - 400 K/uL 86          Latest Ref Rng & Units 10/30/2023    2:26 PM 10/30/2023   12:30 PM 10/30/2023   12:26 PM  BMP  Glucose 70 - 99 mg/dL   284   BUN 8 - 23 mg/dL   15   Creatinine 1.32 - 1.00 mg/dL   4.40   Sodium 102 - 725 mmol/L 143  141  139   Potassium 3.5 - 5.1 mmol/L 4.0  4.3  4.2   Chloride 98 - 111 mmol/L   108      ABG    Component Value Date/Time   PHART 7.260 (L) 10/30/2023 1426   PCO2ART 43.0 10/30/2023 1426   PO2ART 76 (L) 10/30/2023 1426   HCO3 19.5 (L) 10/30/2023 1426   TCO2 21 (L) 10/30/2023 1426   ACIDBASEDEF 7.0 (H) 10/30/2023 1426   O2SAT 93 10/30/2023 1426       Brynda Greathouse, MD 10/30/2023 6:57 PM

## 2023-10-30 NOTE — Op Note (Signed)
 NAME: Erika Wu, Erika Wu MEDICAL RECORD NO: [id number] ACCOUNT NO: [id number] DATE OF BIRTH: Aug 07, 1946 FACILITY: [facility] LOCATION: [facility] PHYSICIAN: Salvatore Decent. Dorris Fetch, MD  Operative Report   DATE OF PROCEDURE: 10/30/2023  PREOPERATIVE DIAGNOSES:  Severe aortic insufficiency and ascending aortic aneurysm.  POSTOPERATIVE DIAGNOSES: Severe aortic insufficiency, ascending aortic aneurysm, hypertrophic septal myocardium.  PROCEDURE:   Median sternotomy, extracorporeal circulation, Aortic valve replacement using a 21-mm Edwards Inspiris Resilia pericardial valve (model [id number], serial [id number]).   Repair of ascending aortic aneurysm using a 30-mm Hemashield graft and  Septal myomectomy.  SURGEON:  Salvatore Decent. Dorris Fetch, MD  ASSISTANT:  Jayme Cloud, MD, general surgery resident, [facility].  Experienced assistance was necessary for this case due to surgical complexity.  Dr. Mindi Slicker assisted with exposure, retraction of delicate tissue, suctioning, and suture management and wound closure.  I performed all critical portions of the procedure and was present for the entirety of the procedure.  ANESTHESIA:  General.  FINDINGS:  Transesophageal echocardiography revealed severe aortic insufficiency.  There was preserved left ventricular systolic function with some left ventricular dilatation.  Large protuberant septal musculature with only a small gradient.  4.5 cm ascending aneurysm.  Intraoperative- aortic valve leaflets intact.  No significant annular calcification.  Hypertrophic septal muscle.  Large ascending aneurysm tapering to relatively normal prior to the takeoff of the innominate artery.  CLINICAL NOTE: Erika Wu is a 78 year old woman with known aortic insufficiency, mild aortic stenosis, and ascending aneurysm.  She has been followed for the aneurysm and aortic insufficiency.  Recently, she developed fatigue.  Echocardiogram showed severe AI with mild to moderate aortic  stenosis.  Ejection fraction was 55% to 60%.  She had some LV dilatation with asymmetric left ventricular hypertrophy and mild diastolic dysfunction.  She had no significant coronary artery disease.  She was advised to undergo aortic valve replacement and repair of her ascending aneurysm.  It was felt that a root replacement would likely not be necessary but was potential.  The indications, risks, benefits, and alternatives were discussed in detail with the patient.  She understood and accepted the risks and agreed to proceed.  OPERATIVE NOTE: Erika Wu was brought to the operating room on 10/30/2023.  She had induction of general anesthesia.  Intravenous antibiotics were administered.  A Foley catheter was placed.  The chest, abdomen, and legs were prepped and draped in the usual sterile fashion.  Transesophageal echocardiography was performed by Dr. Charlynn Grimes of anesthesia.  Please see his separately dictated note for full details of the procedure, but there was a prominent asymmetric hypertrophied septum.  There was severe aortic insufficiency, preserved left ventricular systolic function.  A time-out was performed.  A median sternotomy was performed.  Hemostasis was achieved.  The sternal retractor was placed and was gradually opened over time.  The patient was fully heparinized.  The fat was dissected off the pericardium.  The innominate vein was identified.  The pericardium was opened.  The heart was displaced low and to the left.  There was significant elongation of the ascending aorta with an aneurysm in its mid portion.  It did taper to normal superiorly.  Pericardial stay sutures were placed.  After confirming adequate anticoagulation with ACT measurement, the aorta was cannulated via concentric 2-0 Ethibond pledgeted pursestring sutures.  This was done on the underside of the aortic arch opposite the takeoff of the innominate artery.  A dual-stage venous cannula was placed via a pursestring suture in  the right atrial appendage and directed  into the inferior vena cava.  Cardiopulmonary bypass was initiated.  Flows were maintained per protocol and the patient was cooled to 32 degrees Celsius.  A left ventricular vent was placed via a pursestring suture in the right superior pulmonary vein and a retrograde cardioplegia cannula was placed via a pursestring suture in the right atrium and directed into the coronary sinus.   Temperature probe was placed in the myocardial septum and a foam pad was placed in the pericardium to insulate the heart.  An aortic root vent was placed via a pursestring suture in the ascending aorta.  The aorta was cross-clamped.  The left ventricle was emptied via the vents.  Cardiac arrest then was achieved with cold retrograde KBC blood cardioplegia and topical iced saline.  Initially, there was no rise in pressure, but with manipulation, the balloon inflated and there was a good pressure rise with cardioplegia administration.  There was a rapid diastolic arrest.  A total of 1250 mL of cardioplegia was administered.  The aorta was transected above the sinotubular junction.  The sinotubular junction obtained its normal configuration and the sinuses of Valsalva appeared to be of normal size.  The aortic valve leaflets were relatively normal in appearance with minimal calcification and there was no significant annular calcification.  The aortic valve leaflets were excised.  There was a large hypertrophic septum, which interfered with sizing the annulus for the aortic valve.  A septal myomectomy was performed removing an area of the septum approximately 2 cm wide and 1 cm in depth.  The annulus sized for a 21-mm valve.  A 21-mm Inspiris Resilia valve was prepared per manufacturer's recommendations.  2-0 Ethibond horizontal mattress sutures were placed in the annulus circumferentially.  A total of 15 sutures were utilized.  The sutures were then passed through the sewing ring of the valve.  The  valve was lowered into place and seated nicely.  The sutures were secured using the COR-KNOT device.  The valve was nicely seated.  Probing the annulus with a fine tip right angle revealed no gaps.  Coronary ostia were identified both prior to and after the valve replacement they were well clear of the annulus with no evidence of disease.  Sinotubular junction sized for a 30-mm Hemashield graft.  It was sewn end-to-end to the proximal aorta at the sinotubular junction.  A Teflon felt strip was used to buttress the aortic side of the anastomosis, which was done with a running 4-0 Prolene suture.  The graft then was cut on a bevel and cut to length.  It was then anastomosed end-to-side to the distal aorta just proximal to the cannulation site.  This was also performed with a running 4-0 Prolene suture using a Teflon felt strip to buttress the aortic side of the anastomosis.  While the distal anastomosis was being performed, the cross-clamp time reached 60 minutes, so a second dose of cardioplegia was administered via the retrograde cannula.  After completion of the anastomosis, a McGoon needle was placed through a pursestring suture in the graft.  The patient was placed into Trendelenburg position.  A reanimation dose of 400 mL and  of cardioplegia was administered via the retrograde cannula.  Deairing was performed and then the aortic cross-clamp was removed.  The total cross-clamp time was 73 minutes.    The patient was in heart block.  She did not require defibrillation.  While rewarming was completed, all anastomoses were inspected for hemostasis and additional sutures were placed as  needed.  The retrograde cardioplegia cannula was removed.  Additional deairing maneuvers were performed and the left ventricular vent was removed.  The patient remained in Trendelenburg position.  Epicardial pacing wires were placed on the right ventricle and right atrium.  When the core temperature reached 37 degrees Celsius, she  was weaned from cardiopulmonary bypass on the first attempt.  The total bypass time was 110 minutes.  The initial cardiac index was approximately 2 liters per minute per meter squared and she remained hemodynamically stable throughout the post-bypass period.  Post-bypass transesophageal echocardiography showed good function of the prosthetic valve with no paravalvular leaks.  There was a 9 mm gradient across the valve, preserved left ventricular function, and no other significant valvular pathology.  A test dose of protamine was administered and was well tolerated.  The atrial and aortic cannulae were removed.  The remainder of protamine was administered without incident.  The chest was irrigated with warm saline.  Hemostasis was achieved.  Two mediastinal chest tubes were placed.  A 36-French chest tube was placed anteriorly and then a 28-French Blake drain was placed along the diaphragmatic surface of the pericardium.  The sternum was closed with a combination of single and double heavy-gauge stainless steel wires.  The pectoralis fascia and subcutaneous tissue and skin were closed in a standard fashion.  All sponge, needle, and instrument counts were correct at the end of the procedure.  The patient remained in the operating room because there was no bed available in the ICU.  She was extubated in the operating room.  She then was transported to the surgical intensive care unit in good condition once a bed was available.   PUS D: 10/30/2023 4:59:25 pm T: 10/30/2023 7:11:00 pm  JOB: [id number]/ [id number]

## 2023-10-31 ENCOUNTER — Inpatient Hospital Stay (HOSPITAL_COMMUNITY): Payer: Medicare PPO

## 2023-10-31 ENCOUNTER — Encounter (HOSPITAL_COMMUNITY): Payer: Self-pay | Admitting: Thoracic Surgery (Cardiothoracic Vascular Surgery)

## 2023-10-31 ENCOUNTER — Other Ambulatory Visit: Payer: Self-pay | Admitting: Cardiology

## 2023-10-31 DIAGNOSIS — Z952 Presence of prosthetic heart valve: Secondary | ICD-10-CM

## 2023-10-31 DIAGNOSIS — I351 Nonrheumatic aortic (valve) insufficiency: Secondary | ICD-10-CM

## 2023-10-31 DIAGNOSIS — I7121 Aneurysm of the ascending aorta, without rupture: Secondary | ICD-10-CM

## 2023-10-31 LAB — CBC
HCT: 26.7 % — ABNORMAL LOW (ref 36.0–46.0)
HCT: 26 % — ABNORMAL LOW (ref 36.0–46.0)
Hemoglobin: 9.1 g/dL — ABNORMAL LOW (ref 12.0–15.0)
Hemoglobin: 8.8 g/dL — ABNORMAL LOW (ref 12.0–15.0)
MCH: 32.2 pg (ref 26.0–34.0)
MCH: 32.1 pg (ref 26.0–34.0)
MCHC: 34.1 g/dL (ref 30.0–36.0)
MCHC: 33.8 g/dL (ref 30.0–36.0)
MCV: 94.3 fL (ref 80.0–100.0)
MCV: 94.9 fL (ref 80.0–100.0)
Platelets: 78 10*3/uL — ABNORMAL LOW (ref 150–400)
Platelets: 77 10*3/uL — ABNORMAL LOW (ref 150–400)
RBC: 2.83 MIL/uL — ABNORMAL LOW (ref 3.87–5.11)
RBC: 2.74 MIL/uL — ABNORMAL LOW (ref 3.87–5.11)
RDW: 12.3 % (ref 11.5–15.5)
RDW: 12.5 % (ref 11.5–15.5)
WBC: 11 10*3/uL — ABNORMAL HIGH (ref 4.0–10.5)
WBC: 13 10*3/uL — ABNORMAL HIGH (ref 4.0–10.5)
nRBC: 0 % (ref 0.0–0.2)
nRBC: 0 % (ref 0.0–0.2)

## 2023-10-31 LAB — GLUCOSE, CAPILLARY
Glucose-Capillary: 105 mg/dL — ABNORMAL HIGH (ref 70–99)
Glucose-Capillary: 106 mg/dL — ABNORMAL HIGH (ref 70–99)
Glucose-Capillary: 115 mg/dL — ABNORMAL HIGH (ref 70–99)
Glucose-Capillary: 117 mg/dL — ABNORMAL HIGH (ref 70–99)
Glucose-Capillary: 117 mg/dL — ABNORMAL HIGH (ref 70–99)
Glucose-Capillary: 136 mg/dL — ABNORMAL HIGH (ref 70–99)
Glucose-Capillary: 138 mg/dL — ABNORMAL HIGH (ref 70–99)
Glucose-Capillary: 148 mg/dL — ABNORMAL HIGH (ref 70–99)

## 2023-10-31 LAB — BASIC METABOLIC PANEL
Anion gap: 9 (ref 5–15)
Anion gap: 8 (ref 5–15)
BUN: 8 mg/dL (ref 8–23)
BUN: 9 mg/dL (ref 8–23)
CO2: 20 mmol/L — ABNORMAL LOW (ref 22–32)
CO2: 21 mmol/L — ABNORMAL LOW (ref 22–32)
Calcium: 6.7 mg/dL — ABNORMAL LOW (ref 8.9–10.3)
Calcium: 6.6 mg/dL — ABNORMAL LOW (ref 8.9–10.3)
Chloride: 105 mmol/L (ref 98–111)
Chloride: 103 mmol/L (ref 98–111)
Creatinine, Ser: 1.05 mg/dL — ABNORMAL HIGH (ref 0.44–1.00)
Creatinine, Ser: 0.74 mg/dL (ref 0.44–1.00)
GFR, Estimated: 55 mL/min — ABNORMAL LOW (ref >60)
GFR, Estimated: >60 mL/min (ref >60)
Glucose, Bld: 107 mg/dL — ABNORMAL HIGH (ref 70–99)
Glucose, Bld: 136 mg/dL — ABNORMAL HIGH (ref 70–99)
Potassium: 3.8 mmol/L (ref 3.5–5.1)
Potassium: 4.6 mmol/L (ref 3.5–5.1)
Sodium: 134 mmol/L — ABNORMAL LOW (ref 135–145)
Sodium: 132 mmol/L — ABNORMAL LOW (ref 135–145)

## 2023-10-31 LAB — SURGICAL PATHOLOGY

## 2023-10-31 LAB — MAGNESIUM
Magnesium: 2.3 mg/dL (ref 1.7–2.4)
Magnesium: 2 mg/dL (ref 1.7–2.4)

## 2023-10-31 MED ORDER — FUROSEMIDE 10 MG/ML IJ SOLN
20.0000 mg | Freq: Two times a day (BID) | INTRAMUSCULAR | Status: DC
  Administered 2023-10-31 – 2023-11-01 (×3): 20 mg via INTRAVENOUS
  Filled 2023-10-31 (×3): qty 2

## 2023-10-31 MED ORDER — INSULIN ASPART 100 UNIT/ML IJ SOLN
0.0000 [IU] | INTRAMUSCULAR | Status: DC
  Administered 2023-10-31 (×3): 2 [IU] via SUBCUTANEOUS

## 2023-10-31 MED ORDER — POTASSIUM CHLORIDE 10 MEQ/50ML IV SOLN
10.0000 meq | INTRAVENOUS | Status: AC
Start: 2023-10-31 — End: 2023-10-31
  Administered 2023-10-31 (×3): 10 meq via INTRAVENOUS
  Filled 2023-10-31 (×3): qty 50

## 2023-10-31 NOTE — TOC Initial Note (Signed)
Transition of Care [facility]) - Initial/Assessment Note    Patient Details  Name: Erika Wu MRN: [id number] Date of Birth: 07-27-1946  Transition of Care [facility]) CM/SW Contact:    Gala Lewandowsky, RN Phone Number: 10/31/2023, 12:18 PM  Clinical Narrative:  Patient was discussed in morning rounds-patient POD-1 AVR. PTA patient was independent from home with spouse. Case Manager will continue to follow for transition of care needs as the patient progresses.                Expected Discharge Plan: Home/Self Care Barriers to Discharge: Continued Medical Work up   Patient Goals and CMS Choice Patient states their goals for this hospitalization and ongoing recovery are:: to return home with spouse.   Expected Discharge Plan and Services   Discharge Planning Services: CM Consult Post Acute Care Choice: NA Living arrangements for the past 2 months: Single Family Home      Prior Living Arrangements/Services Living arrangements for the past 2 months: Single Family Home Lives with:: Spouse Patient language and need for interpreter reviewed:: Yes Do you feel safe going back to the place where you live?: Yes      Need for Family Participation in Patient Care: Yes (Comment) Care giver support system in place?: Yes (comment)   Criminal Activity/Legal Involvement Pertinent to Current Situation/Hospitalization: No - Comment as needed  Permission Sought/Granted Permission sought to share information with : Family Supports, Case Manager   Emotional Assessment Appearance:: Appears stated age Attitude/Demeanor/Rapport: Engaged Affect (typically observed): Appropriate Orientation: : Oriented to Self, Oriented to Place, Oriented to  Time, Oriented to Situation Alcohol / Substance Use: Not Applicable Psych Involvement: No (comment)  Admission diagnosis:  S/P AVR (aortic valve replacement) [Z95.2] Patient Active Problem List   Diagnosis Date Noted   S/P AVR and REPLACEMENT OF  ASCENDING AORTA 10/30/2023   Irritable bowel syndrome with diarrhea 07/09/2023   Small intestinal bacterial overgrowth (SIBO) 07/09/2023   Pancreatic cyst 07/09/2023   Bloating 07/09/2023   Abdominal gas pain 07/09/2023   Dilation of pancreatic duct 07/09/2023   Ascending aortic aneurysm (HCC) 05/28/2023   Abnormal nuclear stress test 07/24/2016   Abnormal EKG 03/24/2015   Aortic insufficiency 03/24/2015   Dyspnea on exertion 03/24/2015   History of colonic polyps 10/20/2013   Heart murmur 10/20/2013   Internal hemorrhoids with complication 10/20/2013   ESOPHAGEAL STRICTURE 08/30/2010   DIARRHEA 07/03/2010   GERD 12/14/2008   DYSPEPSIA 12/14/2008   SINUSITIS 12/06/2008   PCP:  Melida Quitter, MD Pharmacy:   [facility] [phone] [place], [place] - [address] AT [place] [address] OF [address] [place] [address] Phone: [phone] Fax: [phone]  [id number] [place],  - [address] AT [facility] [address] [place] [address] Phone: [phone] Fax: [phone]  Social Drivers of Health (SDOH) Social History: SDOH Screenings   Food Insecurity: No Food Insecurity (10/31/2023)  Housing: Unknown (10/31/2023)  Transportation Needs: No Transportation Needs (10/31/2023)  Utilities: Not At Risk (10/31/2023)  Social Connections: Unknown (10/31/2023)  Tobacco Use: Medium Risk (10/30/2023)   SDOH Interventions:     Readmission Risk Interventions     No data to display

## 2023-10-31 NOTE — Plan of Care (Signed)
   Problem: Activity: Goal: Risk for activity intolerance will decrease Outcome: Progressing   Problem: Nutrition: Goal: Adequate nutrition will be maintained Outcome: Progressing

## 2023-10-31 NOTE — Progress Notes (Signed)
1 Day Post-Op Procedure(s) (LRB): AORTIC VALVE REPLACEMENT (AVR) USING INSPIRIS AORTIC VALVE SIZE ; SEPTAL MYECTOMY EXCISION (N/A) ASCENDING AORTIC ANEURYSM REPAIR (AAA) USING HEMASHIELD PLATINUM GRAFT SIZE (N/A) TRANSESOPHAGEAL ECHOCARDIOGRAM (TEE) (N/A) Subjective: Some incisional pain, nausea and belching  Objective: Vital signs in last 24 hours: Temp:  [96.6 F (35.9 C)-99.1 F (37.3 C)] 98.8 F (37.1 C) (02/20 0700) Pulse Rate:  [75-84] 78 (02/20 0700) Cardiac Rhythm: A-V Sequential paced (02/19 2000) Resp:  [5-23] 15 (02/20 0700) BP: (94-156)/(74-123) 125/80 (02/20 0700) SpO2:  [92 %-99 %] 96 % (02/20 0700) Arterial Line BP: (87-159)/(50-101) 136/77 (02/20 0700) Weight:  [60.6 kg] 60.6 kg (02/20 0500)  Hemodynamic parameters for last 24 hours: PAP: (23-46)/(10-27) 33/19 CO:  [2.7 L/min-4.5 L/min] 4 L/min CI:  [1.7 L/min/m2-2.8 L/min/m2] 2.5 L/min/m2  Intake/Output from previous day: 02/19 0701 - 02/20 0700 In: 6023 [P.O.:240; I.V.:3352.7; Blood:485; IV Piggyback:1945.2] Out: 4280 [Urine:2825; Blood:830; Chest Tube:625] Intake/Output this shift: No intake/output data recorded.  General appearance: alert, cooperative, and no distress Neurologic: intact Heart: regular rate and rhythm Lungs: diminished breath sounds bibasilar Abdomen: mildly distended, tympanitic LUQ, nontender  Lab Results: Recent Labs    10/30/23 2004 10/30/23 2011 10/31/23 0500  WBC 9.4  --  11.0*  HGB 9.1* 8.5* 9.1*  HCT 26.8* 25.0* 26.7*  PLT 70*  --  78*   BMET:  Recent Labs    10/30/23 2004 10/30/23 2011 10/31/23 0500  NA 137 139 134*  K 3.7 3.7 3.8  CL 108  --  105  CO2 21*  --  20*  GLUCOSE 116*  --  107*  BUN 9  --  8  CREATININE 0.64  --  1.05*  CALCIUM 6.9*  --  6.7*    PT/INR:  Recent Labs    10/30/23 1429  LABPROT 20.9*  INR 1.8*   ABG    Component Value Date/Time   PHART 7.332 (L) 10/30/2023 2011   HCO3 19.9 (L) 10/30/2023 2011   TCO2 21 (L)  10/30/2023 2011   ACIDBASEDEF 6.0 (H) 10/30/2023 2011   O2SAT 95 10/30/2023 2011   CBG (last 3)  Recent Labs    10/31/23 0213 10/31/23 0447 10/31/23 0728  GLUCAP 106* 105* 115*    Assessment/Plan: S/P Procedure(s) (LRB): AORTIC VALVE REPLACEMENT (AVR) USING INSPIRIS AORTIC VALVE SIZE ; SEPTAL MYECTOMY EXCISION (N/A) ASCENDING AORTIC ANEURYSM REPAIR (AAA) USING HEMASHIELD PLATINUM GRAFT SIZE (N/A) TRANSESOPHAGEAL ECHOCARDIOGRAM (TEE) (N/A) POD # 1 Looks good NEURO- intact CV- in Sinus brady, new LBBB  DDD paced at 80- will change to AAI  Good hemodynamics- dc Swan and A line  Pericardial valve- ASA, no coumadin RESP- IS for atelectasis RENAL- creatinine 1.05  Diurese ENDO_ CBG well controlled  SSI GI- gastric dilatation on CXR and nausea/ belching  Clears only, Reglan Anemia secondary to ABL- mild, monitor Thrombocytopenia- PLT up slightly to 78K  No enoxaparin Dc chest tubes Cardiac rehab   LOS: 1 day    [name] 10/31/2023

## 2023-10-31 NOTE — Discharge Instructions (Signed)

## 2023-10-31 NOTE — Progress Notes (Signed)
      [address]       [place] [address]             [phone]      Nausea better  BP 110/73   Pulse 80   Temp 99.1 F (37.3 C) (Oral)   Resp 18   Ht 5\' 3"  (1.6 m)   Wt 60.6 kg   SpO2 91%   BMI 23.67 kg/m  2L [place] 94% SAT   Intake/Output Summary (Last 24 hours) at 10/31/2023 1752 Last data filed at 10/31/2023 1600 Gross per 24 hour  Intake 1851.77 ml  Output 3135 ml  Net -1283.23 ml   Creatinine 0.74 Hct 26 PLT 77K stable  Javen Hinderliter C. Dorris Fetch, MD [facility] [phone]

## 2023-10-31 NOTE — Discharge Summary (Addendum)
 [address]       [place] [address]             [phone]    Physician Discharge Summary  Patient ID: Erika Wu MRN: [id number] DOB/AGE: [id number] y.o.  Admit date: 10/30/2023 Discharge date: 11/03/2023  Admission Diagnoses: Severe aortic insufficiency, ascending aortic aneurysm  Patient Active Problem List   Diagnosis Date Noted   Irritable bowel syndrome with diarrhea 07/09/2023   Small intestinal bacterial overgrowth (SIBO) 07/09/2023   Pancreatic cyst 07/09/2023   Bloating 07/09/2023   Abdominal gas pain 07/09/2023   Dilation of pancreatic duct 07/09/2023   Ascending aortic aneurysm (HCC) 05/28/2023   Abnormal nuclear stress test 07/24/2016   Abnormal EKG 03/24/2015   Aortic insufficiency 03/24/2015   Dyspnea on exertion 03/24/2015   History of colonic polyps 10/20/2013   Heart murmur 10/20/2013   Internal hemorrhoids with complication 10/20/2013   ESOPHAGEAL STRICTURE 08/30/2010   DIARRHEA 07/03/2010   GERD 12/14/2008   DYSPEPSIA 12/14/2008   SINUSITIS 12/06/2008   Discharge Diagnoses: Severe aortic insufficiency, ascending aortic aneurysm Patient Active Problem List   Diagnosis Date Noted   S/P AVR and REPLACEMENT OF ASCENDING AORTA 10/30/2023   Irritable bowel syndrome with diarrhea 07/09/2023   Small intestinal bacterial overgrowth (SIBO) 07/09/2023   Pancreatic cyst 07/09/2023   Bloating 07/09/2023   Abdominal gas pain 07/09/2023   Dilation of pancreatic duct 07/09/2023   Ascending aortic aneurysm (HCC) 05/28/2023   Abnormal nuclear stress test 07/24/2016   Abnormal EKG 03/24/2015   Aortic insufficiency 03/24/2015   Dyspnea on exertion 03/24/2015   History of colonic polyps 10/20/2013   Heart murmur 10/20/2013   Internal hemorrhoids with complication 10/20/2013   ESOPHAGEAL STRICTURE 08/30/2010   DIARRHEA 07/03/2010   GERD 12/14/2008   DYSPEPSIA 12/14/2008   SINUSITIS 12/06/2008   Discharged Condition:  stable  History of Present Illness:  Erika Wu is a 78 year old woman with a past medical history significant for aortic insufficiency, aortic stenosis, ascending aneurysm, hypertension, hyperlipidemia, reflux, anxiety, and depression.  She has been followed for several years for an ascending aneurysm.  It has been stable at 4.5 cm.  Also followed by Dr. Peter Swaziland for her AI.  She was evaluated by Dr. Dorris Fetch at which time she admitted to experiencing some issues with shortness of breath, which has since improved.  She has been feeling fatigued more easily.  Denies any orthopnea, PND, or swelling in her legs.  No chest pain.  She recently had an echocardiogram which again showed severe AI and mild to moderate aortic stenosis.  Ejection fraction was 55 to 60%.  Left ventricle dilated with moderate asymmetric left ventricular hypertrophy.  Grade 1 diastolic dysfunction.  Dr. Swaziland did right and left heart catheterization which showed no significant coronary artery disease and normal left ventricular filling pressures and right ventricular pressures.  He was in agreement the patient would require Aortic Valve Replacement.  He also felt that she should undergo replacement of her ascending aorta with her aneurysm measuring 4.5 cm.  The risks and benefits of the procedure were explained to the patient and she was agreeable to proceed.  Hospital Course:  Erika Wu presented to [facility] on 10/30/2023.  She was taken to the operating room and underwent Aortic Valve Replacement with a 21 mm Edwards Resilia Inspiris Valve and Replacement of Ascending Aorta with a 30 mm Gelweave Valsalva graft.  He tolerated the procedure without difficulty  and was taken to the SICU in stable condition.   She was extubated the evening of surgery.  She did not require post operative drip support.  Her EKG showed Sinus Bradycardia with new LBBB.  She completed of nausea and excessive belching.  This was felt to  be related to gastric dilatation and she was treated with Reglan and clear liquid diet.  Her chest tubes were removed without difficulty.  Her blood pressure started to increase.  She was started on Coreg for additional control.  Chest xray showed increase in atelectasis and she was instructed to use a flutter valve.  She as started on diuretics to help facilitate diuresis.  She was maintaining NSR and felt stable for transfer to the progressive care unit.    Consults: None  Significant Diagnostic Studies: cardiac graphics:   Echocardiogram:    IMPRESSIONS     1. Severe aortic valve regurgitation with high flow and probable  mild-moderate aortic valve stenosis given mildly reduced cusp opening.  LVESDi 27 mm/m2, LVESVi 45.9 ml/m2. Linear dimensions have not  significantly changed compared to prior exam on side  by side comparison. The aortic valve is tricuspid. There is mild  calcification of the aortic valve. Aortic valve regurgitation is severe.  Aortic valve area, by VTI measures 1.39 cm. Aortic valve mean gradient  measures 30.0 mmHg. Aortic valve Vmax  measures 3.85 m/s.   2. Left ventricular ejection fraction, by estimation, is 55 to 60%. The  left ventricle has normal function. The left ventricle has no regional  wall motion abnormalities. The left ventricular internal cavity size was  mildly dilated. There is moderate  asymmetric left ventricular hypertrophy. Left ventricular diastolic  parameters are consistent with Grade I diastolic dysfunction (impaired  relaxation).   3. Right ventricular systolic function is normal. The right ventricular  size is normal. There is normal pulmonary artery systolic pressure. The  estimated right ventricular systolic pressure is 20.3 mmHg.   4. Left atrial size was mildly dilated.   5. The mitral valve is degenerative. Mild mitral valve regurgitation. No  evidence of mitral stenosis.   6. Aorta angulation makes basal ventricular septal  hypertrophy appear  more prominent.. Aortic dilatation noted. There is mild-moderate  dilatation of the ascending aorta, measuring 44 mm.   7. The inferior vena cava is normal in size with greater than 50%  respiratory variability, suggesting right atrial pressure of 3 mmHg.   Treatments: surgery:   NAME: Erika Wu, Erika Wu MEDICAL RECORD NO: [id number] ACCOUNT NO: [id number] DATE OF BIRTH: April 21, 1946 FACILITY: [facility] LOCATION: [facility] PHYSICIAN: Salvatore Decent. Dorris Fetch, MD   Operative Report    DATE OF PROCEDURE: 10/30/2023   PREOPERATIVE DIAGNOSES:  Severe aortic insufficiency and ascending aortic aneurysm.   POSTOPERATIVE DIAGNOSES: Severe aortic insufficiency, ascending aortic aneurysm, hypertrophic septal myocardium.   PROCEDURE:  Median sternotomy, extracorporeal circulation, aortic valve replacement using a 21-mm Edwards Inspiris Resilia pericardial valve (model [id number], serial [id number]).  Repair of ascending aortic aneurysm using a 30-mm Hemashield graft and septal  myomectomy.   SURGEON:  Salvatore Decent. Dorris Fetch, MD   ASSISTANT:  Jayme Cloud, MD, general surgery resident, [facility].   Discharge Exam: Blood pressure (!) 140/92, pulse 77, temperature 98.8 F (37.1 C), temperature source Oral, resp. rate 18, height 5\' 3"  (1.6 m), weight 55.4 kg, SpO2 97%.  General appearance: alert, cooperative, and no distress Neurologic: intact Heart: Stable sinus rhythm with no further atrial fibrillation.   Heart sounds are normal, expected soft systolic  murmur consistent with a bioprosthetic valve. Lungs: Breath sounds full and equal, clear to auscultation Abdomen: Soft, no tenderness Extremities: Well-perfused with no peripheral edema Wound:  Sternotomy incision is open to air.  The incision is well-approximated and dry   Discharge Medications:  The patient has been discharged on:   1.Beta Blocker:  Yes [ X  ]                              No   [   ]                               If No, reason:  2.Ace Inhibitor/ARB: Yes [   ]                                     No  [  x  ]                                     If No, reason: Will add in outpatient setting.  3.Statin:   Yes [ X  ]                  No  [   ]                  If No, reason:  4.Ecasa:  Yes  [ X  ]                  No   [   ]                  If No, reason:  Patient had ACS upon admission: No   Plavix/P2Y12 inhibitor: Yes [   ]                                      No  [  X ]     Discharge Instructions     AMB Referral to Cardiac Rehabilitation - Phase II   Complete by: As directed    Diagnosis: Valve Replacement   Valve: Aortic   After initial evaluation and assessments completed: Virtual Based Care may be provided alone or in conjunction with Phase 2 Cardiac Rehab based on patient barriers.: Yes   Intensive Cardiac Rehabilitation (ICR) [facility] location only OR Traditional Cardiac Rehabilitation (TCR) *If criteria for ICR are not met will enroll in TCR [facility] only): Yes      Allergies as of 11/03/2023       Reactions   Latex Itching   Tramadol Nausea And Vomiting   And made her feel bad        Medication List     STOP taking these medications    hydrALAZINE 50 MG tablet Commonly known as: APRESOLINE   valsartan 80 MG tablet Commonly known as: Diovan       TAKE these medications    acetaminophen 325 MG tablet Commonly known as: TYLENOL Take 2 tablets (650 mg total) by mouth every 6 (six) hours as needed.   amLODipine 5 MG tablet Commonly known as: NORVASC TAKE 1 TABLET(5 MG) BY MOUTH  DAILY   aspirin EC 325 MG tablet Take 1 tablet (325 mg total) by mouth daily.   Belsomra 20 MG Tabs Generic drug: Suvorexant Take 20 mg by mouth at bedtime.   buPROPion 150 MG 24 hr tablet Commonly known as: WELLBUTRIN XL Take 300 mg by mouth every morning.   carvedilol 12.5 MG tablet Commonly known as: COREG Take 1 tablet (12.5 mg total) by mouth 2 (two) times daily with a  meal. What changed:  medication strength See the new instructions.   furosemide 40 MG tablet Commonly known as: LASIX Take 1 tablet (40 mg total) by mouth daily for 5 days.   mirtazapine 45 MG tablet Commonly known as: REMERON Take 45 mg by mouth at bedtime.   oxyCODONE 5 MG immediate release tablet Commonly known as: Oxy IR/ROXICODONE Take 1 tablet (5 mg total) by mouth every 6 (six) hours as needed for up to 5 days for severe pain (pain score 7-10).   pantoprazole 40 MG tablet Commonly known as: PROTONIX TAKE 1 TABLET(40 MG) BY MOUTH TWICE DAILY BEFORE A MEAL   potassium chloride SA 20 MEQ tablet Commonly known as: KLOR-CON M Take 1 tablet (20 mEq total) by mouth daily for 5 days.   PROBIOTIC-PREBIOTIC PO Take 1 tablet by mouth daily.   rosuvastatin 10 MG tablet Commonly known as: CRESTOR Take 10 mg by mouth daily.        Follow-up Information     [facility] Follow up on 11/26/2023.   Why: Please get CXR at 2:30 Contact information: [address] [facility] [address]        [facility]alee Course, [place] Follow up on 11/21/2023.   Specialties: Cardiology, Radiology Why: Appointment is at 10:05 Contact information: [address] [place] [address] [phone]         [facility] [place] Follow up on 11/26/2023.   Specialty: Cardiothoracic Surgery Why: Appointment is at 3:30 Contact information: [address] [place], [address] [place] [address] [phone]                Signed:  Leary Roca, PA-C  11/03/2023, 9:26 AM

## 2023-11-01 ENCOUNTER — Inpatient Hospital Stay (HOSPITAL_COMMUNITY): Payer: Medicare PPO

## 2023-11-01 ENCOUNTER — Encounter (HOSPITAL_COMMUNITY): Payer: Self-pay | Admitting: Thoracic Surgery (Cardiothoracic Vascular Surgery)

## 2023-11-01 DIAGNOSIS — Z952 Presence of prosthetic heart valve: Secondary | ICD-10-CM

## 2023-11-01 LAB — CBC
HCT: 24.7 % — ABNORMAL LOW (ref 36.0–46.0)
Hemoglobin: 8.5 g/dL — ABNORMAL LOW (ref 12.0–15.0)
MCH: 32.4 pg (ref 26.0–34.0)
MCHC: 34.4 g/dL (ref 30.0–36.0)
MCV: 94.3 fL (ref 80.0–100.0)
Platelets: 70 10*3/uL — ABNORMAL LOW (ref 150–400)
RBC: 2.62 MIL/uL — ABNORMAL LOW (ref 3.87–5.11)
RDW: 12.3 % (ref 11.5–15.5)
WBC: 11 10*3/uL — ABNORMAL HIGH (ref 4.0–10.5)
nRBC: 0 % (ref 0.0–0.2)

## 2023-11-01 LAB — GLUCOSE, CAPILLARY
Glucose-Capillary: 107 mg/dL — ABNORMAL HIGH (ref 70–99)
Glucose-Capillary: 116 mg/dL — ABNORMAL HIGH (ref 70–99)
Glucose-Capillary: 112 mg/dL — ABNORMAL HIGH (ref 70–99)
Glucose-Capillary: 128 mg/dL — ABNORMAL HIGH (ref 70–99)
Glucose-Capillary: 91 mg/dL (ref 70–99)

## 2023-11-01 LAB — BASIC METABOLIC PANEL
Anion gap: 11 (ref 5–15)
BUN: 8 mg/dL (ref 8–23)
CO2: 21 mmol/L — ABNORMAL LOW (ref 22–32)
Calcium: 6.9 mg/dL — ABNORMAL LOW (ref 8.9–10.3)
Chloride: 103 mmol/L (ref 98–111)
Creatinine, Ser: 0.69 mg/dL (ref 0.44–1.00)
GFR, Estimated: >60 mL/min (ref >60)
Glucose, Bld: 104 mg/dL — ABNORMAL HIGH (ref 70–99)
Potassium: 3.5 mmol/L (ref 3.5–5.1)
Sodium: 135 mmol/L (ref 135–145)

## 2023-11-01 MED ORDER — ALUM & MAG HYDROXIDE-SIMETH 200-200-20 MG/5ML PO SUSP: 15.0000 mL | Freq: Four times a day (QID) | ORAL | Status: DC | PRN

## 2023-11-01 MED ORDER — SODIUM CHLORIDE 0.9% FLUSH: 3.0000 mL | INTRAVENOUS | Status: DC | PRN

## 2023-11-01 MED ORDER — NON FORMULARY: 20.0000 mg | Freq: Every day | Status: DC

## 2023-11-01 MED ORDER — FUROSEMIDE 40 MG PO TABS
40.0000 mg | ORAL_TABLET | Freq: Every day | ORAL | Status: DC
  Administered 2023-11-01 – 2023-11-03 (×3): 40 mg via ORAL
  Filled 2023-11-01 (×3): qty 1

## 2023-11-01 MED ORDER — ~~LOC~~ CARDIAC SURGERY, PATIENT & FAMILY EDUCATION: Freq: Once | Status: AC

## 2023-11-01 MED ORDER — SUVOREXANT 20 MG PO TABS
20.0000 mg | ORAL_TABLET | Freq: Every day | ORAL | Status: DC
  Administered 2023-11-01 – 2023-11-02 (×2): 20 mg via ORAL
  Filled 2023-11-01 (×3): qty 1

## 2023-11-01 MED ORDER — MAGNESIUM HYDROXIDE 400 MG/5ML PO SUSP: 30.0000 mL | Freq: Every day | ORAL | Status: DC | PRN

## 2023-11-01 MED ORDER — SODIUM CHLORIDE 0.9 % IV SOLN: 250.0000 mL | INTRAVENOUS | Status: AC | PRN

## 2023-11-01 MED ORDER — CARVEDILOL 3.125 MG PO TABS
3.1250 mg | ORAL_TABLET | Freq: Two times a day (BID) | ORAL | Status: DC
  Administered 2023-11-01 – 2023-11-02 (×3): 3.125 mg via ORAL
  Filled 2023-11-01 (×3): qty 1

## 2023-11-01 MED ORDER — INSULIN ASPART 100 UNIT/ML IJ SOLN
0.0000 [IU] | Freq: Three times a day (TID) | INTRAMUSCULAR | Status: DC
  Administered 2023-11-01 – 2023-11-02 (×2): 2 [IU] via SUBCUTANEOUS

## 2023-11-01 MED ORDER — METOCLOPRAMIDE HCL 5 MG/ML IJ SOLN
10.0000 mg | Freq: Four times a day (QID) | INTRAMUSCULAR | Status: AC
  Administered 2023-11-01 (×3): 10 mg via INTRAVENOUS
  Filled 2023-11-01 (×3): qty 2

## 2023-11-01 MED ORDER — SODIUM CHLORIDE 0.9% FLUSH
3.0000 mL | Freq: Two times a day (BID) | INTRAVENOUS | Status: DC
  Administered 2023-11-01 – 2023-11-02 (×4): 3 mL via INTRAVENOUS

## 2023-11-01 MED ORDER — POTASSIUM CHLORIDE CRYS ER 10 MEQ PO TBCR
20.0000 meq | EXTENDED_RELEASE_TABLET | Freq: Two times a day (BID) | ORAL | Status: DC
  Administered 2023-11-01 – 2023-11-02 (×3): 20 meq via ORAL
  Filled 2023-11-01 (×6): qty 2

## 2023-11-01 NOTE — Plan of Care (Signed)

## 2023-11-01 NOTE — Progress Notes (Signed)
2 Days Post-Op Procedure(s) (LRB): AORTIC VALVE REPLACEMENT (AVR) USING INSPIRIS AORTIC VALVE SIZE ; SEPTAL MYECTOMY EXCISION (N/A) ASCENDING AORTIC ANEURYSM REPAIR (AAA) USING HEMASHIELD PLATINUM GRAFT SIZE (N/A) TRANSESOPHAGEAL ECHOCARDIOGRAM (TEE) (N/A) Subjective: Didn't sleep well, some incisional pain  Objective: Vital signs in last 24 hours: Temp:  [98.1 F (36.7 C)-99.6 F (37.6 C)] 98.1 F (36.7 C) (02/21 0723) Pulse Rate:  [68-86] 75 (02/21 0700) Cardiac Rhythm: Atrial paced (02/20 2000) Resp:  [11-30] 18 (02/21 0700) BP: (91-135)/(65-111) 114/68 (02/21 0500) SpO2:  [91 %-97 %] 93 % (02/21 0700) Arterial Line BP: (123-152)/(68-84) 123/68 (02/20 1000) Weight:  [58 kg] 58 kg (02/21 0500)  Hemodynamic parameters for last 24 hours: PAP: (31-42)/(17-28) 31/17 CO:  [4 L/min] 4 L/min CI:  [2.5 L/min/m2] 2.5 L/min/m2  Intake/Output from previous day: 02/20 0701 - 02/21 0700 In: 1006.6 [P.O.:350; I.V.:106.7; IV Piggyback:549.8] Out: 3150 [Urine:3150] Intake/Output this shift: No intake/output data recorded.  General appearance: alert, cooperative, and no distress Neurologic: intact Heart: regular rate and rhythm Lungs: diminished breath sounds bibasilar Abdomen: normal findings: soft, non-tender  Lab Results: Recent Labs    10/31/23 1550 11/01/23 0540  WBC 13.0* 11.0*  HGB 8.8* 8.5*  HCT 26.0* 24.7*  PLT 77* 70*   BMET:  Recent Labs    10/31/23 1550 11/01/23 0540  NA 132* 135  K 4.6 3.5  CL 103 103  CO2 21* 21*  GLUCOSE 136* 104*  BUN 9 8  CREATININE 0.74 0.69  CALCIUM 6.6* 6.9*    PT/INR:  Recent Labs    10/30/23 1429  LABPROT 20.9*  INR 1.8*   ABG    Component Value Date/Time   PHART 7.332 (L) 10/30/2023 2011   HCO3 19.9 (L) 10/30/2023 2011   TCO2 21 (L) 10/30/2023 2011   ACIDBASEDEF 6.0 (H) 10/30/2023 2011   O2SAT 95 10/30/2023 2011   CBG (last 3)  Recent Labs    10/31/23 2336 11/01/23 0333 11/01/23 0721  GLUCAP 138*  107* 116*    Assessment/Plan: S/P Procedure(s) (LRB): AORTIC VALVE REPLACEMENT (AVR) USING INSPIRIS AORTIC VALVE SIZE ; SEPTAL MYECTOMY EXCISION (N/A) ASCENDING AORTIC ANEURYSM REPAIR (AAA) USING HEMASHIELD PLATINUM GRAFT SIZE (N/A) TRANSESOPHAGEAL ECHOCARDIOGRAM (TEE) (N/A) POD # 2 NEURO- intact CV- in SR with PACs under pacer  New LBBB Low dose Coreg for BP  ASA,  RESP- has some atelectasis +/- small effusion left base  IS, add flutter RENAL- diuresed well  PO Lasix, supplement K ENDO= CBG mildly elevated  Change SSI to AC and HS Gi- advance diet as tolerated Thrombocytopenia- stable Anemia- monitor Transfer to 4E  LOS: 2 days    [name] 11/01/2023

## 2023-11-01 NOTE — Progress Notes (Signed)
Patient arrived from  [facility] VSS alert and oriented x 4 CCMD notifed, CHG bath completed, Bed in lowest position with call light in reach current plan of care in progress

## 2023-11-02 ENCOUNTER — Inpatient Hospital Stay (HOSPITAL_COMMUNITY): Payer: Medicare PPO

## 2023-11-02 LAB — CBC
HCT: 23.5 % — ABNORMAL LOW (ref 36.0–46.0)
Hemoglobin: 8.1 g/dL — ABNORMAL LOW (ref 12.0–15.0)
MCH: 32 pg (ref 26.0–34.0)
MCHC: 34.5 g/dL (ref 30.0–36.0)
MCV: 92.9 fL (ref 80.0–100.0)
Platelets: 82 10*3/uL — ABNORMAL LOW (ref 150–400)
RBC: 2.53 MIL/uL — ABNORMAL LOW (ref 3.87–5.11)
RDW: 12.5 % (ref 11.5–15.5)
WBC: 11.1 10*3/uL — ABNORMAL HIGH (ref 4.0–10.5)
nRBC: 0 % (ref 0.0–0.2)

## 2023-11-02 LAB — GLUCOSE, CAPILLARY
Glucose-Capillary: 94 mg/dL (ref 70–99)
Glucose-Capillary: 123 mg/dL — ABNORMAL HIGH (ref 70–99)
Glucose-Capillary: 106 mg/dL — ABNORMAL HIGH (ref 70–99)
Glucose-Capillary: 131 mg/dL — ABNORMAL HIGH (ref 70–99)

## 2023-11-02 LAB — BASIC METABOLIC PANEL
Anion gap: 12 (ref 5–15)
BUN: 12 mg/dL (ref 8–23)
CO2: 20 mmol/L — ABNORMAL LOW (ref 22–32)
Calcium: 7 mg/dL — ABNORMAL LOW (ref 8.9–10.3)
Chloride: 103 mmol/L (ref 98–111)
Creatinine, Ser: 0.77 mg/dL (ref 0.44–1.00)
GFR, Estimated: >60 mL/min (ref >60)
Glucose, Bld: 91 mg/dL (ref 70–99)
Potassium: 3.7 mmol/L (ref 3.5–5.1)
Sodium: 135 mmol/L (ref 135–145)

## 2023-11-02 MED ORDER — POTASSIUM CHLORIDE CRYS ER 20 MEQ PO TBCR: 20.0000 meq | EXTENDED_RELEASE_TABLET | Freq: Three times a day (TID) | ORAL | Status: DC

## 2023-11-02 MED ORDER — POTASSIUM CHLORIDE CRYS ER 20 MEQ PO TBCR
20.0000 meq | EXTENDED_RELEASE_TABLET | Freq: Three times a day (TID) | ORAL | Status: DC
  Administered 2023-11-02 – 2023-11-03 (×3): 20 meq via ORAL
  Filled 2023-11-02 (×3): qty 1

## 2023-11-02 MED ORDER — MIRTAZAPINE 30 MG PO TBDP
45.0000 mg | ORAL_TABLET | Freq: Every day | ORAL | Status: DC
  Administered 2023-11-02: 45 mg via ORAL
  Filled 2023-11-02 (×3): qty 1

## 2023-11-02 MED ORDER — CARVEDILOL 12.5 MG PO TABS
12.5000 mg | ORAL_TABLET | Freq: Two times a day (BID) | ORAL | Status: DC
  Administered 2023-11-02 – 2023-11-03 (×2): 12.5 mg via ORAL
  Filled 2023-11-02 (×2): qty 1

## 2023-11-02 NOTE — Progress Notes (Signed)
 CARDIAC REHAB PHASE I   PRE:  Rate/Rhythm: 79 NSR  BP:  Sitting: 110/61      SpO2: 96 RA  MODE:  Ambulation: 1110 ft    POST:  Rate/Rhythm: 83 NSR  BP:  Sitting: 117/79      SpO2: 96 RA  Pt ambulated with standby assistance in the hallway using RW. Pt steady and motivated to increase strength. Pt returned to bed w/o c/p.   Faustino Congress  MS, ACSM-CEP 10:28 AM 11/02/2023    Service time is from 1015 to 1028.

## 2023-11-02 NOTE — Progress Notes (Signed)
 [address]       [place] [address]             [phone]      3 Days Post-Op Procedure(s) (LRB): AORTIC VALVE REPLACEMENT (AVR) USING INSPIRIS AORTIC VALVE SIZE ; SEPTAL MYECTOMY EXCISION (N/A) ASCENDING AORTIC ANEURYSM REPAIR (AAA) USING HEMASHIELD PLATINUM GRAFT SIZE (N/A) TRANSESOPHAGEAL ECHOCARDIOGRAM (TEE) (N/A) Subjective: Resting in bed with family at the bedside.  Awake and alert, no concerns.  She feels she is progressing.  On room air BM yesterday  Objective: Vital signs in last 24 hours: Temp:  [98 F (36.7 C)-98.3 F (36.8 C)] 98.1 F (36.7 C) (02/22 0800) Pulse Rate:  [70-82] 79 (02/22 0800) Cardiac Rhythm: Normal sinus rhythm (02/22 0335) Resp:  [16-20] 18 (02/22 0800) BP: (104-126)/(58-94) 104/58 (02/22 0332) SpO2:  [94 %-98 %] 98 % (02/22 0332) Weight:  [56.7 kg] 56.7 kg (02/22 0459)     Intake/Output from previous day: 02/21 0701 - 02/22 0700 In: 306 [P.O.:300; I.V.:6] Out: 800 [Urine:800] Intake/Output this shift: No intake/output data recorded.  General appearance: alert, cooperative, and no distress Neurologic: intact Heart: Brief run of atrial fibrillation with a rate around 120 at 9:30 PM last night.  Otherwise, stable sinus rhythm with left bundle branch block.  Heart sounds are normal, expected soft systolic murmur consistent with a bioprosthetic valve. Lungs: Breath sounds full and equal, clear to auscultation Abdomen: Soft, no tenderness Extremities: Well-perfused with no peripheral edema Wound:  Sternotomy incision is covered with a dry Aquacel dressing.  Lab Results: Recent Labs    11/01/23 0540 11/02/23 0311  WBC 11.0* 11.1*  HGB 8.5* 8.1*  HCT 24.7* 23.5*  PLT 70* 82*   BMET:  Recent Labs    11/01/23 0540 11/02/23 0311  NA 135 135  K 3.5 3.7  CL 103 103  CO2 21* 20*  GLUCOSE 104* 91  BUN 8 12  CREATININE 0.69 0.77  CALCIUM 6.9* 7.0*    PT/INR:  Recent Labs    10/30/23 1429   LABPROT 20.9*  INR 1.8*   ABG    Component Value Date/Time   PHART 7.332 (L) 10/30/2023 2011   HCO3 19.9 (L) 10/30/2023 2011   TCO2 21 (L) 10/30/2023 2011   ACIDBASEDEF 6.0 (H) 10/30/2023 2011   O2SAT 95 10/30/2023 2011   CBG (last 3)  Recent Labs    11/01/23 1630 11/01/23 2115 11/02/23 0618  GLUCAP 128* 91 94    Assessment/Plan: S/P Procedure(s) (LRB): AORTIC VALVE REPLACEMENT (AVR) USING INSPIRIS AORTIC VALVE SIZE ; SEPTAL MYECTOMY EXCISION (N/A) ASCENDING AORTIC ANEURYSM REPAIR (AAA) USING HEMASHIELD PLATINUM GRAFT SIZE (N/A) TRANSESOPHAGEAL ECHOCARDIOGRAM (TEE) (N/A)  -Postop day 3 bioprosthetic aortic valve replacement for severe aortic insufficiency and repair of ascending aortic aneurysm with a Hemashield graft.  Stable vital signs and hemodynamics.  She is progressing with mobility.  On aspirin, low-dose carvedilol, and rosuvastatin.  -Atrial fibrillation: Very brief episode last night around 930 with a rate around 120.  Will increase her carvedilol to 12.5 mg twice daily (she was on 25 mg twice daily at home) and also give additional potassium supplement to keep K+ above 4.  -Pulm: Normal work of breathing on room air.  Chest x-ray this morning shows small bibasilar effusions with atelectasis.  Encouraging pulmonary hygiene.  -Renal: Normal function.  Current weight equivalent to preop.  Continue Lasix 40 mg daily for small effusions.  -Heme: Mild expected acute blood loss anemia  and thrombocytopenia.  Hematocrit stable, platelet count is trending up.  Monitor  -Disposition: Anticipate discharge to home with her family in 1 to 2 days.   LOS: 3 days    Leary Roca, PA-C 11/02/2023

## 2023-11-03 LAB — CBC
HCT: 24.9 % — ABNORMAL LOW (ref 36.0–46.0)
Hemoglobin: 8.4 g/dL — ABNORMAL LOW (ref 12.0–15.0)
MCH: 32.2 pg (ref 26.0–34.0)
MCHC: 33.7 g/dL (ref 30.0–36.0)
MCV: 95.4 fL (ref 80.0–100.0)
Platelets: 131 10*3/uL — ABNORMAL LOW (ref 150–400)
RBC: 2.61 MIL/uL — ABNORMAL LOW (ref 3.87–5.11)
RDW: 12.7 % (ref 11.5–15.5)
WBC: 8.7 10*3/uL (ref 4.0–10.5)
nRBC: 0 % (ref 0.0–0.2)

## 2023-11-03 LAB — BASIC METABOLIC PANEL
Anion gap: 8 (ref 5–15)
BUN: 16 mg/dL (ref 8–23)
CO2: 21 mmol/L — ABNORMAL LOW (ref 22–32)
Calcium: 7.2 mg/dL — ABNORMAL LOW (ref 8.9–10.3)
Chloride: 107 mmol/L (ref 98–111)
Creatinine, Ser: 0.73 mg/dL (ref 0.44–1.00)
GFR, Estimated: >60 mL/min (ref >60)
Glucose, Bld: 95 mg/dL (ref 70–99)
Potassium: 4.6 mmol/L (ref 3.5–5.1)
Sodium: 136 mmol/L (ref 135–145)

## 2023-11-03 LAB — GLUCOSE, CAPILLARY
Glucose-Capillary: 88 mg/dL (ref 70–99)
Glucose-Capillary: 147 mg/dL — ABNORMAL HIGH (ref 70–99)

## 2023-11-03 MED ORDER — ACETAMINOPHEN 325 MG PO TABS: 650.0000 mg | ORAL_TABLET | Freq: Four times a day (QID) | ORAL | Status: AC | PRN

## 2023-11-03 MED ORDER — CARVEDILOL 12.5 MG PO TABS: 12.5000 mg | ORAL_TABLET | Freq: Two times a day (BID) | ORAL | 2 refills | Status: DC

## 2023-11-03 MED ORDER — OXYCODONE HCL 5 MG PO TABS: 5.0000 mg | ORAL_TABLET | Freq: Four times a day (QID) | ORAL | 0 refills | Status: AC | PRN

## 2023-11-03 MED ORDER — ASPIRIN 325 MG PO TBEC
325.0000 mg | DELAYED_RELEASE_TABLET | Freq: Every day | ORAL | 3 refills | Status: DC
Start: 2023-11-03 — End: 2024-03-17

## 2023-11-03 MED ORDER — FUROSEMIDE 40 MG PO TABS: 40.0000 mg | ORAL_TABLET | Freq: Every day | ORAL | 0 refills | Status: DC

## 2023-11-03 MED ORDER — POTASSIUM CHLORIDE CRYS ER 20 MEQ PO TBCR
20.0000 meq | EXTENDED_RELEASE_TABLET | Freq: Every day | ORAL | 0 refills | Status: DC
Start: 2023-11-03 — End: 2023-11-26

## 2023-11-03 NOTE — Progress Notes (Signed)
 Note plans for discharge home today.  Arrangements have been made for follow-up outpatient echocardiogram and cardiology clinic follow-up.

## 2023-11-03 NOTE — Progress Notes (Signed)
 Patient and daughter both verbalized understanding of instructions. All belongings and paperwork given to patient. All questions answered.

## 2023-11-03 NOTE — Progress Notes (Signed)
 [address]       [place] [address]             [phone]      4 Days Post-Op Procedure(s) (LRB): AORTIC VALVE REPLACEMENT (AVR) USING INSPIRIS AORTIC VALVE SIZE ; SEPTAL MYECTOMY EXCISION (N/A) ASCENDING AORTIC ANEURYSM REPAIR (AAA) USING HEMASHIELD PLATINUM GRAFT SIZE (N/A) TRANSESOPHAGEAL ECHOCARDIOGRAM (TEE) (N/A) Subjective: Resting in bed with family at the bedside.  Awake and alert, no concerns.  Walked over 1100 feet yesterday.  On room air   Objective: Vital signs in last 24 hours: Temp:  [98 F (36.7 C)-98.7 F (37.1 C)] 98.7 F (37.1 C) (02/23 0750) Pulse Rate:  [76-92] 77 (02/23 0750) Cardiac Rhythm: Normal sinus rhythm;Bundle branch block (02/22 1956) Resp:  [18] 18 (02/23 0305) BP: (116-140)/(64-90) 116/64 (02/23 0750) SpO2:  [93 %-97 %] 93 % (02/23 0305) Weight:  [55.4 kg] 55.4 kg (02/23 0615)     Intake/Output from previous day: 02/22 0701 - 02/23 0700 In: 243 [P.O.:240; I.V.:3] Out: -  Intake/Output this shift: No intake/output data recorded.  General appearance: alert, cooperative, and no distress Neurologic: intact Heart: Stable sinus rhythm with no further atrial fibrillation.   Heart sounds are normal, expected soft systolic murmur consistent with a bioprosthetic valve. Lungs: Breath sounds full and equal, clear to auscultation Abdomen: Soft, no tenderness Extremities: Well-perfused with no peripheral edema Wound:  Sternotomy incision is open to air.  The incision is well-approximated and dry.  Lab Results: Recent Labs    11/02/23 0311 11/03/23 0301  WBC 11.1* 8.7  HGB 8.1* 8.4*  HCT 23.5* 24.9*  PLT 82* 131*   BMET:  Recent Labs    11/02/23 0311 11/03/23 0301  NA 135 136  K 3.7 4.6  CL 103 107  CO2 20* 21*  GLUCOSE 91 95  BUN 12 16  CREATININE 0.77 0.73  CALCIUM 7.0* 7.2*    PT/INR:  No results for input(s): "LABPROT", "INR" in the last 72 hours.  ABG    Component Value Date/Time    PHART 7.332 (L) 10/30/2023 2011   HCO3 19.9 (L) 10/30/2023 2011   TCO2 21 (L) 10/30/2023 2011   ACIDBASEDEF 6.0 (H) 10/30/2023 2011   O2SAT 95 10/30/2023 2011   CBG (last 3)  Recent Labs    11/02/23 1657 11/02/23 2115 11/03/23 0610  GLUCAP 106* 131* 88    Assessment/Plan: S/P Procedure(s) (LRB): AORTIC VALVE REPLACEMENT (AVR) USING INSPIRIS AORTIC VALVE SIZE ; SEPTAL MYECTOMY EXCISION (N/A) ASCENDING AORTIC ANEURYSM REPAIR (AAA) USING HEMASHIELD PLATINUM GRAFT SIZE (N/A) TRANSESOPHAGEAL ECHOCARDIOGRAM (TEE) (N/A)  -Postop day 4 bioprosthetic aortic valve replacement for severe aortic insufficiency and repair of ascending aortic aneurysm with a Hemashield graft.  Stable vital signs and cardiac rhythm.  She is progressing with mobility.  On aspirin, carvedilol, and rosuvastatin.   -Atrial fibrillation: Very brief episode on postop day 2 with a rate around 120.  No further A-fib on monitor reviewed.  Will continue her carvedilol to 12.5 mg twice daily (she was on 25 mg twice daily at home).  Potassium 4.6 today  -Pulm: Normal work of breathing on room air.    Encouraging continued work on pulmonary hygiene at home.  -Renal: Normal function.  Current weight equivalent to preop.  Continue Lasix 40 mg daily for a few more days after discharge for small effusions.  -Heme: Mild expected acute blood loss anemia and thrombocytopenia.  Hematocrit stable, platelet count is trending up.  Monitor  -Disposition:  discharge to home today with her family.  Instructions given and follow-up has been arranged.   LOS: 4 days    Leary Roca, PA-C 11/03/2023

## 2023-11-04 ENCOUNTER — Telehealth (HOSPITAL_COMMUNITY): Payer: Self-pay | Admitting: *Deleted

## 2023-11-04 MED FILL — Potassium Chloride Inj 2 mEq/ML: INTRAVENOUS | Qty: 40 | Status: AC

## 2023-11-04 MED FILL — Lidocaine HCl Local Preservative Free (PF) Inj 2%: INTRAMUSCULAR | Qty: 14 | Status: AC

## 2023-11-04 MED FILL — Heparin Sodium (Porcine) Inj 1000 Unit/ML: Qty: 1000 | Status: AC

## 2023-11-08 ENCOUNTER — Other Ambulatory Visit: Payer: Self-pay | Admitting: Physician Assistant

## 2023-11-11 ENCOUNTER — Telehealth: Payer: Self-pay | Admitting: Cardiology

## 2023-11-11 NOTE — Telephone Encounter (Signed)
 Spoke to patient's daughter [place].Stated mother is unable to sleep since her heart surgery 2 weeks ago.She is taking Bellsomra 20 mg at night and it is not working. Stated she is weak but doing well.Advised to call PCP.Keep appointment with Azalee Course PA 3/13 at 10:05 am.I will make Dr.Jordan aware.

## 2023-11-11 NOTE — Telephone Encounter (Signed)
 Daughter called in to say that the patient haven't been sleeping well, since her surgery. Daughter is concern and calling to see what she needs to do. Please advise

## 2023-11-15 NOTE — Progress Notes (Unsigned)
 [address]       Erika Wu [address]             [phone]       HPI: Erika Wu is a 78 year old female with a past medical history of aortic insufficiency, aortic stenosis, ascending aneurysm, hypertension, hyperlipidemia, reflux, anxiety, and depression. The patient returns for routine postoperative follow-up having undergone Aortic Valve Replacement with a 21 mm Edwards Resilia Inspiris Valve and Replacement of the Ascending Aorta with a 30 mm Gelweave Valsalva graft by Dr. Dorris Fetch on 10/30/23. The patient's early postoperative recovery while in the hospital was notable for gastric dilation that resolved with Reglan and a brief episode of atrial fibrillation that did not reoccur and she was not started on anticoagulation. She was discharged in stable condition on 11/03/23.  She was seen by cardiology on 03/13 and was doing well but did have a lack of appetite, difficulty sleeping and dizziness that has lead to a lack of ambulation since surgery. It is believed she has not had further episodes of atrial fibrillation. Her echocardiogram is scheduled on 12/12/23. Today she is here with her daughter. Since hospital discharge the patient reports some weakness and dizziness with sudden movements and standing quickly but she denies LOC and falls. Her BP is around 116-120s when taken at home. She admits she has difficulty taking deep breaths but denies shortness of breath, she denies pain and has not needed narcotic pain medication. She also complains of nightmares and hot sweats especially at night. She feels like she cannot stay hydrated due to this. She states she is not walking as much as she would like to but when asked to specify she is walking around her house all throughout the day she just hasn't been walking in the neighborhood yet.    Current Outpatient Medications  Medication Sig Dispense Refill   acetaminophen (TYLENOL) 325 MG tablet Take 2 tablets (650 mg  total) by mouth every 6 (six) hours as needed.     amLODipine (NORVASC) 5 MG tablet TAKE 1 TABLET(5 MG) BY MOUTH DAILY 90 tablet 3   aspirin EC 325 MG tablet Take 1 tablet (325 mg total) by mouth daily. 100 tablet 3   Bacillus Coagulans-Inulin (PROBIOTIC-PREBIOTIC PO) Take 1 tablet by mouth daily.     buPROPion (WELLBUTRIN XL) 150 MG 24 hr tablet Take 300 mg by mouth every morning.  3   carvedilol (COREG) 12.5 MG tablet Take 1 tablet (12.5 mg total) by mouth 2 (two) times daily with a meal. 60 tablet 2   furosemide (LASIX) 40 MG tablet Take 1 tablet (40 mg total) by mouth daily for 5 days. 5 tablet 0   mirtazapine (REMERON) 45 MG tablet Take 45 mg by mouth at bedtime.     pantoprazole (PROTONIX) 40 MG tablet TAKE 1 TABLET(40 MG) BY MOUTH TWICE DAILY BEFORE A MEAL 180 tablet 3   potassium chloride SA (KLOR-CON M) 20 MEQ tablet Take 1 tablet (20 mEq total) by mouth daily for 5 days. 5 tablet 0   rosuvastatin (CRESTOR) 10 MG tablet Take 10 mg by mouth daily.     Suvorexant (BELSOMRA) 20 MG TABS Take 20 mg by mouth at bedtime.     No current facility-administered medications for this visit.   Vitals: Today's Vitals   11/26/23 1504  BP: 114/78  Pulse: 88  Resp: 18  SpO2: 95%  Weight: 118 lb (53.5 kg)  Height: 5\' 3"  (1.6  m)   Body mass index is 20.9 kg/m.  Physical Exam: General: Alert and oriented, no acute distress Neuro: Grossly intact CV: Regular rate and rhythm, no murmur Pulm: Clear to auscultation bilaterally GI: +BS, nontender, no distension Extremities: No edema BLE Wound: Clean and dry and healing well without sign of infection  Diagnostic Tests: CXR read pending, no infiltrate or pleural effusion seen, possibly small right basilar atelectasis.   Impression/Plan: S/P AVR and aortic replacement: Ms. Schlegel is progressing very well from surgery. She denies pain and is not using anything for pain control. She is ambulating well around her house and is eager to start walking  around the neighborhood. She does admit difficulty getting a deep breath but denies shortness of breath with exertion. Possibly due to atelectasis and she is still using her incentive spirometer which was encouraged, this should improve with time. Her incisions are healing well without sign of infection. She does admit she has only been taking ASA 81mg  daily and I recommended she change this to ASA 325mg  as prescribed for her aortic valve. She does admit to dizziness with quick movements, low blood pressure at times, nightmares and night sweats. I recommended she stay hydrated and add electrolytes such as sodium to her hydration regimen as well as try compression stockings for her dizziness. We also discussed standing up slowly and not making too sudden of movements. Hopefully the dizziness will improve as she becomes more conditioned. The nightmares, night sweats and lack of appetite are to be expected postoperatively and should continue to improve with time. We reviewed positive night time routines and bedtime habits to help with her nightmares. I cleared her to drive today and for cardiac rehab. We also discussed endocarditis prophylaxis. Plan to have the patient return to the clinic as needed.   Jenny Reichmann, PA-C [facility] [phone]

## 2023-11-18 ENCOUNTER — Telehealth (HOSPITAL_COMMUNITY): Payer: Self-pay

## 2023-11-18 NOTE — Telephone Encounter (Signed)
 Pt insurance is active and benefits verified through [facility]. Co-pay $20.00, DED $0.00/$0.00 met, out of pocket $4,000.00/$296.33 met, co-insurance 0%. No pre-authorization required. [employer], 11/18/23 @ 9:25AM, REF#[id number]   How many CR sessions are covered? (36 visits for TCR, 72 visits for ICR)72 Is this a lifetime maximum or an annual maximum? Annual Has the member used any of these services to date? No Is there a time limit (weeks/months) on start of program and/or program completion? No     Will contact patient to see if she is interested in the Cardiac Rehab Program. If interested, patient will need to complete follow up appt. Once completed, patient will be contacted for scheduling upon review by the RN Navigator.

## 2023-11-18 NOTE — Telephone Encounter (Signed)
 Attempted to call patient in regards to Cardiac Rehab - LM on VM

## 2023-11-20 NOTE — Progress Notes (Unsigned)
 Cardiology Office Note:  .   Date:  11/21/2023  ID:  Erika Wu, DOB August 14, 1946, MRN [id number] PCP: Erika Quitter, MD   HeartCare Providers Cardiologist:  Erika Swaziland, MD {  History of Present Illness: Marland Kitchen   Erika Wu is a 78 y.o. 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  October 2017 had Myoview with abnormal apical ischemia prompting cardiac catheterization November 2017 showing minimal nonobstructive CAD with preserved LVEF.  Heart monitor with no significant arrhythmias.  Echocardiogram May 2022 showing progression of AI with moderate aortic stenosis, dilated ascending aorta measuring 39 mm.  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  HIDA scan in 05/2021 was normal.  September 2022 had intermittent palpitations prompting event monitor showing infrequent episodes of NSVT with the longest episode only lasting 3 minutes.  Metoprolol was increased with improvement in symptoms.    More recent/pertinent history: She was seen in clinic January 2025 noted to be more short of breath, echocardiogram from October 2024 showing severe AI, moderate AS with complaints of decreased exercise intolerance.  Now plan for AVR, with replacement of ascending aorta with aneurysm measuring 4.5 cm.  Right left heart catheterization showing nonobstructive CAD with severe AI, proximal aortic aneurysm with normal left and right filling pressures  preserved cardiac output.  She had AVR 10/30/2023 with  TEE showing stable valve with no perivalvular leaks.  Postoperatively did well but EKG now with new LBBB.  Noted to have postoperative atrial fibrillation on day 2, but no recurrences.  Not discharged on anticoagulation.  Otherwise postoperatively did well with no significant complications.  Still waiting to do postoperative echocardiogram.  Today she is here for follow-up and accompanied with her daughter.  She still is recovering and admits to some mild complaints of dizziness and nightmares.  Difficulty sleeping.  We discussed getting as much sunlight exposure, resetting her circadian rhythm and overall good habits of sleep hygiene and exercise.  They also admit though she has not been eating very much since the procedure and likely dehydrated.  No episodes of passing out, falls.  She reports a mild improvement in functional status but has been very sedentary since the procedure still.  Mild constipation, discussed increasing hydration and fiber intake.  ROS: Denies: Chest pain, shortness of breath, orthopnea, peripheral edema, palpitations, decreased exercise intolerance, palpitations.   Studies Reviewed: .        Cardiac Studies & Procedures   ______________________________________________________________________________________________ CARDIAC CATHETERIZATION  CARDIAC CATHETERIZATION 10/03/2023  Narrative Nonobstructive CAD Severe aortic insufficiency Proximal aortic aneurysm Normal LV filling pressures. PCWP 15/12 mean 13 mm Hg Normal right heart pressures. PAP 28/14, mean 22 mm Hg Normal cardiac output 3.53 L/min, index 2.28  Findings Coronary Findings Diagnostic  Dominance: Right  Left Main Vessel was injected. Vessel is normal in caliber. Vessel is angiographically normal.  Left Anterior Descending Prox LAD to Mid LAD lesion is 30% stenosed. The lesion is calcified.  Left Circumflex Vessel was injected. Vessel is  normal  in caliber. Vessel is angiographically normal.  Right Coronary Artery Prox RCA lesion is 20% stenosed.  Intervention  No interventions have been documented.   CARDIAC CATHETERIZATION  CARDIAC CATHETERIZATION 07/24/2016  Narrative  The left ventricular systolic function is normal.  LV end diastolic pressure is normal.  The left ventricular ejection fraction is 55-65% by visual estimate.  Prox LAD lesion, 20 %stenosed.  Prox RCA to Mid RCA lesion, 10 %stenosed.  1. Mild nonobstructive CAD 2. Normal LV function 3. Normal LVEDP  Plan: medical management.  Findings Coronary Findings Diagnostic  Dominance: Right  Left Main Vessel was injected. Vessel is normal in caliber. Vessel is angiographically normal.  Left Anterior Descending The lesion is mildly calcified.  Left Circumflex Vessel was injected. Vessel is normal in caliber. Vessel is angiographically normal.  Right Coronary Artery  Intervention  No interventions have been documented.   STRESS TESTS  MYOCARDIAL PERFUSION IMAGING 07/13/2016  Narrative  Nuclear stress EF: 60%.  The left ventricular ejection fraction is normal (55-65%).  There was no ST segment deviation noted during stress.  There is a small defect of mild severity present in the apical lateral and apical location. The defect is reversible and is consistent with a small area of ischemia although shifting breast attenuation artifact. is also a possiblity.  This is an intermediate risk study due to apical reversible defect.   ECHOCARDIOGRAM  ECHOCARDIOGRAM COMPLETE 06/26/2023  Narrative ECHOCARDIOGRAM REPORT    Patient Name:   Erika Wu Date of Exam: 06/26/2023 Medical Rec #:  [id number]       Height:       63.0 in Accession #:    [id number]      Weight:       122.0 lb Date of Birth:  1946/08/28        BSA:          1.567 m Patient Age:    77 years        BP:           138/69 mmHg Patient Gender: F                HR:           52 bpm. Exam Location:  [address]  Procedure: 2D Echo, Cardiac Doppler, Color Doppler, 3D Echo and Strain Analysis  Indications:    I35.9* Nonrheumatic aortic valve disease, unspecified  History:        Patient has prior history of Echocardiogram examinations, most recent 08/15/2022. CAD, Abnormal ECG, Signs/Symptoms:Syncope, Chest Pain and Murmur; Risk Factors:Hypertension, Dyslipidemia and Former Smoker. Aneurysm of ascending aorta without rupture. Dyspnea on exertion. Palpitations.  Sonographer:    Cathie Beams RCS Referring Phys: [phone] Erika M Wu  IMPRESSIONS   1. Severe aortic valve regurgitation with high flow and probable mild-moderate aortic valve stenosis given mildly reduced cusp opening. LVESDi 27 mm/m2, LVESVi 45.9 ml/m2. Linear dimensions have not significantly changed compared to prior exam on side by side comparison. The aortic valve is tricuspid. There is mild calcification of the aortic valve. Aortic valve regurgitation is severe. Aortic valve area, by VTI measures 1.39 cm. Aortic valve mean gradient measures 30.0 mmHg. Aortic valve Vmax measures 3.85 m/s. 2. Left ventricular ejection fraction, by estimation, is 55 to 60%. The left ventricle has normal function. The left ventricle has no regional wall motion abnormalities. The left ventricular internal cavity size was mildly dilated. There is moderate asymmetric left ventricular hypertrophy. Left ventricular diastolic parameters are consistent  with Grade I diastolic dysfunction (impaired relaxation). 3. Right ventricular systolic function is normal. The right ventricular size is normal. There is normal pulmonary artery systolic pressure. The estimated right ventricular systolic pressure is 20.3 mmHg. 4. Left atrial size was mildly dilated. 5. The mitral valve is degenerative. Mild mitral valve regurgitation. No evidence of mitral stenosis. 6. Aorta angulation makes basal ventricular septal  hypertrophy appear more prominent.. Aortic dilatation noted. There is mild-moderate dilatation of the ascending aorta, measuring 44 mm. 7. The inferior vena cava is normal in size with greater than 50% respiratory variability, suggesting right atrial pressure of 3 mmHg.  FINDINGS Left Ventricle: Left ventricular ejection fraction, by estimation, is 55 to 60%. The left ventricle has normal function. The left ventricle has no regional wall motion abnormalities. Global longitudinal strain performed but not reported based on interpreter judgement due to suboptimal tracking. 3D ejection fraction reviewed and evaluated as part of the interpretation. Alternate measurement of EF is felt to be most reflective of LV function. The left ventricular internal cavity size was mildly dilated. There is moderate asymmetric left ventricular hypertrophy. Left ventricular diastolic parameters are consistent with Grade I diastolic dysfunction (impaired relaxation).  Right Ventricle: The right ventricular size is normal. No increase in right ventricular wall thickness. Right ventricular systolic function is normal. There is normal pulmonary artery systolic pressure. The tricuspid regurgitant velocity is 2.08 m/s, and with an assumed right atrial pressure of 3 mmHg, the estimated right ventricular systolic pressure is 20.3 mmHg.  Left Atrium: Left atrial size was mildly dilated.  Right Atrium: Right atrial size was normal in size.  Pericardium: Trivial pericardial effusion is present.  Mitral Valve: The mitral valve is degenerative in appearance. Mild to moderate mitral annular calcification. Mild mitral valve regurgitation. No evidence of mitral valve stenosis.  Tricuspid Valve: The tricuspid valve is normal in structure. Tricuspid valve regurgitation is mild . No evidence of tricuspid stenosis.  Aortic Valve: Severe aortic valve regurgitation with high flow and probable mild-moderate aortic valve stenosis given  mildly reduced cusp opening. LVESDi 27 mm/m2, LVESVi 45.9 ml/m2. Linear dimensions have not significantly changed compared to prior exam on side by side comparison. The aortic valve is tricuspid. There is mild calcification of the aortic valve. Aortic valve regurgitation is severe. Aortic regurgitation PHT measures 458 msec. Aortic valve mean gradient measures 30.0 mmHg. Aortic valve peak gradient measures 59.3 mmHg. Aortic valve area, by VTI measures 1.39 cm.  Pulmonic Valve: The pulmonic valve was normal in structure. Pulmonic valve regurgitation is not visualized. No evidence of pulmonic stenosis.  Aorta: Aorta angulation makes basal ventricular septal hypertrophy appear more prominent. Aortic dilatation noted. There is mild-moderate dilatation of the ascending aorta, measuring 44 mm.  Venous: The inferior vena cava is normal in size with greater than 50% respiratory variability, suggesting right atrial pressure of 3 mmHg.  IAS/Shunts: The atrial septum is grossly normal.   LEFT VENTRICLE PLAX 2D LVIDd:         5.70 cm      Diastology LVIDs:         4.30 cm      LV e' medial:    5 cm/s LV PW:         1.00 cm      LV E/e' medial: LV IVS:        0.80 cm      LV e' lateral:   5 cm/s LVOT diam:     2.20 cm  LV E/e' lateral: LV SV:         124 LV SV Index:   79 LVOT Area:     3.80 cm  3D Volume EF: LV Volumes (MOD)            3D EF:        62 % LV vol d, MOD A2C: 212.9 ml LV EDV:       174 ml LV vol d, MOD A4C: 171.3 ml LV ESV:       65 ml LV vol s, MOD A2C: 84.0 ml  LV SV:        108 ml LV vol s, MOD A4C: 60.1 ml LV SV MOD A2C:     128.9 ml LV SV MOD A4C:     171.3 ml LV SV MOD BP:      118.4 ml  RIGHT VENTRICLE RV Basal diam:  2.60 cm RV S prime:     12.10 cm/s TAPSE (M-mode): 2.0 cm RVSP:           20.3 mmHg  LEFT ATRIUM             Index        RIGHT ATRIUM           Index LA diam:        3.60 cm 2.30 cm/m   RA Pressure: 3.00 mmHg LA Vol (A2C):   47.1 ml 30.05 ml/m   RA Area:     14.00 cm LA Vol (A4C):   54.5 ml 34.77 ml/m  RA Volume:   30.00 ml  19.14 ml/m LA Biplane Vol: 52.5 ml 33.50 ml/m AORTIC VALVE AV Area (Vmax):    1.29 cm AV Area (Vmean):   1.37 cm AV Area (VTI):     1.39 cm AV Vmax:           385.00 cm/s AV Vmean:          237.000 cm/s AV VTI:            0.897 m AV Peak Grad:      59.3 mmHg AV Mean Grad:      30.0 mmHg LVOT Vmax:         131.00 cm/s LVOT Vmean:        85.600 cm/s LVOT VTI:          0.327 m LVOT/AV VTI ratio: 0.36 AI PHT:            458 msec  AORTA Ao Root diam: 3.90 cm Ao Asc diam:  4.40 cm  MITRAL VALVE               TRICUSPID VALVE MV Area (PHT): 2.80 cm    TR Peak grad:   17.3 mmHg MV Decel Time: 271 msec    TR Vmax:        208.00 cm/s MR Peak grad: 58.2 mmHg    Estimated RAP:  3.00 mmHg MR Mean grad: 23.5 mmHg    RVSP:           20.3 mmHg MR Vmax:      381.50 cm/s MR Vmean:     217.0 cm/s   SHUNTS MV E velocity: 56.20 cm/s  Systemic VTI:  0.33 m MV A velocity: 90.40 cm/s  Systemic Diam: 2.20 cm MV E/A ratio:  0.62  Weston Brass MD Electronically signed by Weston Brass MD Signature Date/Time: 06/29/2023/2:21:27 PM    Final   TEE  ECHO INTRAOPERATIVE TEE 10/30/2023  Narrative *  INTRAOPERATIVE TRANSESOPHAGEAL REPORT *    Patient Name:   Erika Wu Date of Exam: 10/30/2023 Medical Rec #:  [id number]       Height:       63.0 in Accession #:    [id number]      Weight:       125.0 lb Date of Birth:  11-01-1945        BSA:          1.58 m Patient Age:    77 years        BP: Patient Gender: F               HR: Exam Location:  Anesthesiology  Transesophogeal exam was perform intraoperatively during surgical procedure. Patient was closely monitored under general anesthesia during the entirety of examination.  Indications:     AVR Performing Phys: Anice Paganini DO  Complications: No known complications during this procedure. POST-OP IMPRESSIONS _ Left Ventricle: The left ventriclar  function is normal following separation from CPB. _ Right Ventricle: The right ventriclar function is normal following separation from CPB. _ Aorta: There is no dissection. _ Left Atrial Appendage: There is no clot in the LAA. _ Aortic Valve: There is a 21mm Inspiris valve in the aortic position. The valve is well seated with no paravalvular leak. Mean PG . There is no LVOT obstruction or flow acceleration noted at the septal knuckle. There is no shut visualized by color flow doppler interrogation of the basal interventricular septal wall following myectomy. _ Mitral Valve: The mitral valve appears unchanged from pre-bypass. Trace MR. _ Tricuspid Valve: The tricuspid valve appears unchanged from pre-bypass. Mild TR. _ Pulmonic Valve: The pulmonic valve appears unchanged from pre-bypass. _ Interatrial Septum: The interatrial septum appears unchanged from pre-bypass. _ Pericardium: The pericardium appears unchanged from pre-bypass.  PRE-OP FINDINGS Left Ventricle: The left ventricle has normal systolic function, with an ejection fraction of 55-60%. The cavity size was mildly dilated. There is prominent hypertrophy of the basal septal wall in the area of the LVOT, with flow acceleration noted during systole.   Right Ventricle: The right ventricle has normal systolic function. The cavity was normal.  Left Atrium: No left atrial/left atrial appendage thrombus was detected.  Interatrial Septum: There is no evidence of a patent foramen ovale.  Pericardium: There is no evidence of pericardial effusion. There is no pleural effusion.  Mitral Valve: The mitral valve is normal in structure. Some mitral annular calcification around the posterior leaflet. Mitral valve regurgitation is trivial by color flow Doppler.  Tricuspid Valve: The tricuspid valve was normal in structure. Tricuspid valve regurgitation is trivial by color flow Doppler.  Aortic Valve: The aortic valve is tricuspid.  Aortic valve regurgitation is severe by color flow Doppler. There is holodiastolic flow reversal in the descending thoracic aorta.   Pulmonic Valve: The pulmonic valve was normal in structure. Pulmonic valve regurgitation is trivial by color flow Doppler.   Aorta: The aortic root and ascending aorta are dilated. There is effacement of the sinotubular junction.  +--------------+--------++ LEFT VENTRICLE         +--------------+--------++ PLAX 2D                +--------------+--------++ LVOT diam:    1.70 cm  +--------------+--------++ LVOT Area:    2.27 cm +--------------+--------++                        +--------------+--------++  +------------------+------------++ AORTIC VALVE                   +------------------+------------++  AV Area (Vmax):   1.57 cm     +------------------+------------++ AV Area (Vmean):  1.51 cm     +------------------+------------++ AV Area (VTI):    1.57 cm     +------------------+------------++ AV Vmax:          186.50 cm/s  +------------------+------------++ AV Vmean:         125.000 cm/s +------------------+------------++ AV VTI:           0.324 m      +------------------+------------++ AV Peak Grad:     13.9 mmHg    +------------------+------------++ AV Mean Grad:     8.0 mmHg     +------------------+------------++ LVOT Vmax:        129.10 cm/s  +------------------+------------++ LVOT Vmean:       83.350 cm/s  +------------------+------------++ LVOT VTI:         0.225 m      +------------------+------------++ LVOT/AV VTI ratio:0.69         +------------------+------------++ AR PHT:           238 msec     +------------------+------------++  +--------------+-------++ AORTA                 +--------------+-------++ Ao Sinus diam:4.30 cm +--------------+-------++ Ao STJ diam:  4.0 cm  +--------------+-------++ Ao Asc diam:  4.40  cm +--------------+-------++   +--------------+-------+ SHUNTS                +--------------+-------+ Systemic VTI: 0.22 m  +--------------+-------+ Systemic Diam:1.70 cm +--------------+-------+   Anice Paganini Electronically signed by Anice Paganini Signature Date/Time: 10/30/2023/5:16:29 PM    Final  MONITORS  LONG TERM MONITOR-LIVE TELEMETRY (3-14 DAYS) 06/30/2021  Narrative  Normal sinus rhythm  6 episode of nonsustained VT 4-7 beats  Episodes of SVT- appear reentrant with sudden onset and termination. longest lasting 2 minutes and 54 seconds at rate 156 bpm   Patch Wear Time:  14 days and 0 hours (2022-09-29T15:04:27-0400 to 2022-10-13T15:04:27-0400)  Patient had a min HR of 50 bpm, max HR of 188 bpm, and avg HR of 69 bpm. Predominant underlying rhythm was Sinus Rhythm. 6 Ventricular Tachycardia runs occurred, the run with the fastest interval lasting 4 beats with a max rate of 188 bpm, the longest lasting 7 beats with an avg rate of 128 bpm. 18 Supraventricular Tachycardia runs occurred, the run with the fastest interval lasting 2 mins 54 secs with a max rate of 156 bpm (avg 143 bpm); the run with the fastest interval was also the longest. Supraventricular Tachycardia was detected within +/- 45 seconds of symptomatic patient event(s). Isolated SVEs were rare (<1.0%), SVE Couplets were rare (<1.0%), and SVE Triplets were rare (<1.0%). Isolated VEs were rare (<1.0%, 2958), VE Couplets were rare (<1.0%, 115), and VE Triplets were rare (<1.0%, 7).       ______________________________________________________________________________________________      Risk Assessment/Calculations:             Physical Exam:   VS:  BP 110/76 (BP Location: Right Leg, Patient Position: Sitting, Cuff Size: Normal)   Pulse 96   Ht 5\' 3"  (1.6 m)   Wt 118 lb (53.5 kg)   SpO2 97%   BMI 20.90 kg/m    Wt Readings from Last 3 Encounters:  11/21/23 118 lb (53.5 kg)  11/03/23 122  lb 1.6 oz (55.4 kg)  10/28/23 124 lb 11.2 oz (56.6 kg)    GEN: Well nourished, well developed in no acute distress NECK: No JVD; No carotid bruits CARDIAC: RRR, no murmurs,  rubs, gallops RESPIRATORY:  Clear to auscultation without rales, wheezing or rhonchi  ABDOMEN: Soft, non-tender, non-distended EXTREMITIES:  No edema; No deformity   ASSESSMENT AND PLAN: .    AVR with ascending aortic aneurysm repair 10/2023 (21 mm Edwards Resilia Inspiris valve, septal myomectomy, ascending AA repair with a 30 mm Hemashield platinum graft)  Septal myectomy Postoperatively still in recovery phase but overall doing well.  Biggest issue is some dizziness/sleep likely related to poor p.o. hydration/intake and sleep.  Talked about sunlight exposure and good sleep hygiene, staying hydrated. Has appt with CT surg 11/26/2023. Continue with cardiac rehab if okay with them.  6-week echocardiogram scheduled for 12/12/18/2025.  Intraoperative TEE showing well-seated valve with no leaks Spoke with CT surgery continue aspirin 325mg  daily for at least 3 months, then can drop to 81mg .   Postoperative atrial fibrillation Reportedly brief episode postop day 2 with no recurrences. Not on DOAC. Continue to monitor.   Hypertension Well-controlled. Continue carvedilol 12.5 mg twice daily, amlodipine 5 mg  Hyperlipidemia Managed by PCP.  On rosuvastatin 10 mg.  LDL 58 in April 2024.   Cardiac Rehabilitation Eligibility Assessment         Dispo: 4 months   Signed, Abagail Kitchens, PA-C

## 2023-11-21 ENCOUNTER — Ambulatory Visit: Payer: Medicare PPO | Attending: Physician Assistant | Admitting: Cardiology

## 2023-11-21 ENCOUNTER — Encounter: Payer: Self-pay | Admitting: Physician Assistant

## 2023-11-21 ENCOUNTER — Telehealth (HOSPITAL_COMMUNITY): Payer: Self-pay

## 2023-11-21 VITALS — BP 110/76 | HR 96 | Ht 63.0 in | Wt 118.0 lb

## 2023-11-21 DIAGNOSIS — Z8679 Personal history of other diseases of the circulatory system: Secondary | ICD-10-CM

## 2023-11-21 DIAGNOSIS — E785 Hyperlipidemia, unspecified: Secondary | ICD-10-CM | POA: Diagnosis not present

## 2023-11-21 DIAGNOSIS — Z952 Presence of prosthetic heart valve: Secondary | ICD-10-CM | POA: Diagnosis not present

## 2023-11-21 DIAGNOSIS — I10 Essential (primary) hypertension: Secondary | ICD-10-CM | POA: Diagnosis not present

## 2023-11-21 DIAGNOSIS — Z9889 Other specified postprocedural states: Secondary | ICD-10-CM

## 2023-11-21 NOTE — Telephone Encounter (Signed)
 Patient called regarding cardiac rehab. Advised patient we will call to schedule after she has been cleared.

## 2023-11-21 NOTE — Patient Instructions (Signed)
 Medication Instructions:  NO CHANGES *If you need a refill on your cardiac medications before your next appointment, please call your pharmacy*   Lab Work: NO LABS If you have labs (blood work) drawn today and your tests are completely normal, you will receive your results only by: MyChart Message (if you have MyChart) OR A paper copy in the mail If you have any lab test that is abnormal or we need to change your treatment, we will call you to review the results.   Testing/Procedures: NO TESTING   Follow-Up: At [facility], you and your health needs are our priority.  As part of our continuing mission to provide you with exceptional heart care, we have created designated Provider Care Teams.  These Care Teams include your primary Cardiologist (physician) and Advanced Practice Providers (APPs -  Physician Assistants and Nurse Practitioners) who all work together to provide you with the care you need, when you need it.  Your next appointment:   4 month(s)  Provider:   Peter Swaziland, MD  Other Instructions

## 2023-11-25 ENCOUNTER — Other Ambulatory Visit: Payer: Self-pay | Admitting: Thoracic Surgery (Cardiothoracic Vascular Surgery)

## 2023-11-25 DIAGNOSIS — I7121 Aneurysm of the ascending aorta, without rupture: Secondary | ICD-10-CM

## 2023-11-26 ENCOUNTER — Ambulatory Visit
Admission: RE | Admit: 2023-11-26 | Discharge: 2023-11-26 | Disposition: A | Discharge status: Home / Self Care | Source: Ambulatory Visit | Attending: Thoracic Surgery (Cardiothoracic Vascular Surgery) | Admitting: Thoracic Surgery (Cardiothoracic Vascular Surgery)

## 2023-11-26 ENCOUNTER — Encounter: Payer: Self-pay | Admitting: Physician Assistant

## 2023-11-26 ENCOUNTER — Ambulatory Visit (INDEPENDENT_AMBULATORY_CARE_PROVIDER_SITE_OTHER): Payer: Self-pay | Admitting: Physician Assistant

## 2023-11-26 VITALS — BP 114/78 | HR 88 | Resp 18 | Ht 63.0 in | Wt 118.0 lb

## 2023-11-26 DIAGNOSIS — I7121 Aneurysm of the ascending aorta, without rupture: Secondary | ICD-10-CM

## 2023-11-26 DIAGNOSIS — Z952 Presence of prosthetic heart valve: Secondary | ICD-10-CM

## 2023-11-26 DIAGNOSIS — J90 Pleural effusion, not elsewhere classified: Secondary | ICD-10-CM | POA: Diagnosis not present

## 2023-11-26 NOTE — Patient Instructions (Addendum)
 You may return to driving an automobile as long as you are no longer requiring oral narcotic pain relievers during the daytime.  It would be wise to start driving only short distances during the daylight and gradually increase from there as you feel comfortable.  You are encouraged to enroll and participate in the outpatient cardiac rehab program beginning as soon as practical.  Continue to avoid any heavy lifting or strenuous use of your arms or shoulders for at least a total of three months from the time of surgery.  After three months you may gradually increase how much you lift or otherwise use your arms or chest as tolerated, with limits based upon whether or not activities lead to the return of significant discomfort.  Endocarditis is a potentially serious infection of heart valves or inside lining of the heart.  It occurs more commonly in patients with diseased heart valves (such as patient's with aortic or mitral valve disease) and in patients who have undergone heart valve repair or replacement.  Certain surgical and dental procedures may put you at risk, such as dental cleaning, other dental procedures, or any surgery involving the respiratory, urinary, gastrointestinal tract, gallbladder or prostate gland.   To minimize your chances for develooping endocarditis, maintain good oral health and seek prompt medical attention for any infections involving the mouth, teeth, gums, skin or urinary tract.    Always notify your doctor or dentist about your underlying heart valve condition before having any invasive procedures. You will need to take antibiotics before certain procedures, including all routine dental cleanings or other dental procedures.  Your cardiologist or dentist should prescribe these antibiotics for you to be taken ahead of time.

## 2023-11-29 ENCOUNTER — Telehealth (HOSPITAL_COMMUNITY): Payer: Self-pay

## 2023-11-29 NOTE — Telephone Encounter (Signed)
 Called pt to confirm appt for 1330 on 3/24. No answer, left message.    [name], MS, ACSM-CEP 11/29/2023 4:57 PM

## 2023-11-29 NOTE — Telephone Encounter (Signed)
 Called patient to see if she was interested in participating in the Cardiac Rehab Program. Patient will come in for orientation on 12/02/23 @ 130PM and will attend the 145PM exercise class.  Pensions consultant.

## 2023-12-02 ENCOUNTER — Encounter (HOSPITAL_COMMUNITY)
Admission: RE | Admit: 2023-12-02 | Discharge: 2023-12-02 | Disposition: A | Discharge status: Home / Self Care | Source: Ambulatory Visit | Attending: Cardiology | Admitting: Cardiology

## 2023-12-02 VITALS — BP 106/70 | HR 82 | Ht 63.0 in | Wt 120.8 lb

## 2023-12-02 DIAGNOSIS — Z952 Presence of prosthetic heart valve: Secondary | ICD-10-CM | POA: Insufficient documentation

## 2023-12-02 DIAGNOSIS — Z48812 Encounter for surgical aftercare following surgery on the circulatory system: Secondary | ICD-10-CM | POA: Diagnosis not present

## 2023-12-02 NOTE — Progress Notes (Signed)
 Cardiac Rehab Medication Review   Does the patient  feel that his/her medications are working for him/her? Yes    Has the patient been experiencing any side effects to the medications prescribed? No  Does the patient measure his/her own blood pressure or blood glucose at home?   Yes  Does the patient have any problems obtaining medications due to transportation or finances?   No  Understanding of regimen: excellent Understanding of indications: excellent Potential of compliance: excellent    Comments: Erika Wu understands her medications/regime well. She checks her BP and SpO2 daily.    [name], MS, ACSM-CEP 12/02/2023 2:33 PM ,

## 2023-12-02 NOTE — Progress Notes (Signed)
 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 (Patient taking differently: Take 325 mg by mouth daily. Patient reports only taking 81mg ), Disp: 100 tablet, Rfl: 3   Bacillus Coagulans-Inulin (PROBIOTIC-PREBIOTIC PO), Take 1 tablet by mouth daily., Disp: , Rfl:    buPROPion (WELLBUTRIN XL) 150 MG 24 hr tablet, Take 300 mg by mouth every morning., Disp: , Rfl: 3   carvedilol (COREG) 12.5 MG tablet, Take 1 tablet (12.5 mg total) by mouth 2 (two) times daily with a meal., Disp: 60 tablet, Rfl: 2   mirtazapine (REMERON) 45 MG tablet, Take 45 mg by mouth at bedtime., Disp: , Rfl:    pantoprazole (PROTONIX) 40 MG tablet, TAKE 1 TABLET(40 MG) BY MOUTH TWICE DAILY BEFORE A MEAL, Disp: 180 tablet, Rfl: 3   rosuvastatin (CRESTOR) 10 MG tablet, Take 10 mg by mouth daily., Disp: , Rfl:    Suvorexant (BELSOMRA) 20 MG TABS, Take 20 mg by mouth at bedtime., Disp: , Rfl:    temazepam (RESTORIL) 15 MG capsule, Take 15 mg by mouth at bedtime as needed for sleep., Disp: , Rfl:   Past Medical History: Past Medical History:  Diagnosis Date   Allergy     Anxiety    Arthritis    Chest pain    a. 03/2015 Lexiscan MV: EF 59%, no ischemia/infarct.   Colon polyps    COVID-19 virus infection    09-22-2020, 10/2022 fully vax'd   Depression    Family history of adverse reaction to anesthesia    daughter has PONV   GERD (gastroesophageal reflux disease)    Heart murmur    Hemorrhoids    Hyperlipidemia    Hypertension    Moderate aortic insufficiency    a. 03/2015 Echo: EF 55-60%, no rwma, mild LVH, Gr 1 DD, mild AS, mod AI (bicuspid AoV by report, poorly visualized on this study), Asc Ao diam 44mm, triv MR.    Tobacco Use: Social History   Tobacco Use  Smoking Status Former   Current packs/day: 0.50   Average packs/day: 0.5 packs/day for 15.0 years (7.5 ttl pk-yrs)   Types: Cigarettes  Smokeless Tobacco Never    Labs: Review Flowsheet       Latest Ref Rng & Units 07/19/2016 10/03/2023 10/28/2023 10/30/2023  Labs for ITP Cardiac and Pulmonary Rehab  Cholestrol <200 mg/dL 629  - - -  LDL (calc) mg/dL 89  - - -  HDL-C >52 mg/dL 86  - - -  Trlycerides <150 mg/dL 77  - - -  Hemoglobin W4X 4.8 - 5.6 % - - 5.2  -  PH,  Arterial 7.35 - 7.45 - 7.367  - 7.332  7.260  7.322  7.397  7.323  7.316   PCO2 arterial 32 - 48 mmHg - 35.8  - 37.4  43.0  41.2  36.4  39.6  37.0   Bicarbonate 20.0 - 28.0 mmol/L - 21.6  21.6  20.5  - 19.9  19.5  21.3  22.4  21.3  20.5  18.9   TCO2 22 - 32 mmol/L - 23  23  22   - 21  21  23  24  26  23  26  23  22  21  20  22    Acid-base deficit 0.0 - 2.0 mmol/L - 4.0  4.0  4.0  - 6.0  7.0  4.0  2.0  4.0  5.0  7.0   O2 Saturation % - 57  63  92  - 95  93  100  100  70  100  100     Details       Multiple values from one day are sorted in reverse-chronological order         Capillary Blood Glucose: Lab Results  Component Value Date   GLUCAP 147 (H) 11/03/2023   GLUCAP 88 11/03/2023   GLUCAP 131 (H) 11/02/2023   GLUCAP 106 (H) 11/02/2023   GLUCAP 123 (H) 11/02/2023     Exercise Target Goals: Exercise Program  Goal: Individual exercise prescription set using results from initial 6 min walk test and THRR while considering  patient's activity barriers and safety.   Exercise Prescription Goal: Initial exercise prescription builds to 30-45 minutes a day of aerobic activity, 2-3 days per week.  Home exercise guidelines will be given to patient during program as part of exercise prescription that the participant will acknowledge.  Activity Barriers & Risk Stratification:  Activity Barriers & Cardiac Risk Stratification - 12/02/23 1432       Activity Barriers & Cardiac Risk Stratification   Activity Barriers Neck/Spine Problems;Balance Concerns;Deconditioning;Other (comment)    Comments sternal precautions    Cardiac Risk Stratification High   <5 METs on            6 Minute Walk:  6 Minute Walk     Row Name 12/02/23 1541         6 Minute Walk   Phase Initial     Distance 1200 feet     Walk Time 6 minutes     # of Rest Breaks 0     MPH 2.27     METS 2.5     RPE 13     Perceived Dyspnea  0     VO2 Peak 8.76     Symptoms No     Resting HR 82 bpm     Resting BP 106/70     Resting Oxygen Saturation  99 %     Exercise Oxygen Saturation  during 6 min walk 99 %     Max Ex. HR 93 bpm     Max Ex. BP 122/70     2 Minute Post BP 108/68              Oxygen Initial Assessment:   Oxygen Re-Evaluation:   Oxygen Discharge (Final Oxygen Re-Evaluation):   Initial Exercise Prescription:  Initial Exercise Prescription - 12/02/23 1500       Date of Initial Exercise RX and Referring Provider   Date 12/02/23    Referring Provider Peter [place], MD    Expected Discharge Date  02/26/24      NuStep   Level 1    SPM 70    Minutes 15    METs 2      Track   Laps 15    Minutes 15    METs 2      Prescription Details   Frequency (times per week) 3    Duration Progress to 30 minutes of continuous aerobic without signs/symptoms of physical distress      Intensity   THRR  40-80% of Max Heartrate 56-115    Ratings of Perceived Exertion 11-13    Perceived Dyspnea 0-4      Progression   Progression Continue progressive overload as per policy without signs/symptoms or physical distress.      Resistance Training   Training Prescription Yes    Weight 2    Reps 10-15             Perform Capillary Blood Glucose checks as needed.  Exercise Prescription Changes:   Exercise Comments:   Exercise Goals and Review:   Exercise Goals     Row Name 12/02/23 1316             Exercise Goals   Increase Physical Activity Yes       Intervention Provide advice, education, support and counseling about physical activity/exercise needs.;Develop an individualized exercise prescription for aerobic and resistive training based on initial evaluation findings, risk stratification, comorbidities and participant's personal goals.       Expected Outcomes Short Term: Attend rehab on a regular basis to increase amount of physical activity.;Long Term: Exercising regularly at least 3-5 days a week.;Long Term: Add in home exercise to make exercise part of routine and to increase amount of physical activity.       Increase Strength and Stamina Yes       Intervention Provide advice, education, support and counseling about physical activity/exercise needs.;Develop an individualized exercise prescription for aerobic and resistive training based on initial evaluation findings, risk stratification, comorbidities and participant's personal goals.       Expected Outcomes Short Term: Perform resistance training exercises routinely during rehab and add in resistance training at home;Short Term: Increase workloads from initial exercise prescription for resistance, speed, and METs.;Long Term: Improve cardiorespiratory fitness, muscular endurance and strength as measured by increased METs and functional capacity ( )       Able to understand and use rate of perceived exertion (RPE) scale Yes        Intervention Provide education and explanation on how to use RPE scale       Expected Outcomes Short Term: Able to use RPE daily in rehab to express subjective intensity level;Long Term:  Able to use RPE to guide intensity level when exercising independently       Knowledge and understanding of Target Heart Rate Range (THRR) Yes       Intervention Provide education and explanation of THRR including how the numbers were predicted and where they are located for reference       Expected Outcomes Short Term: Able to state/look up THRR;Short Term: Able to use daily as guideline for intensity in rehab;Long Term: Able to use THRR to govern intensity when exercising independently       Understanding of Exercise Prescription Yes       Intervention Provide education, explanation, and written materials on patient's individual exercise prescription       Expected Outcomes Short Term: Able to explain program exercise prescription;Long Term: Able to explain  home exercise prescription to exercise independently                Exercise Goals Re-Evaluation :   Discharge Exercise Prescription (Final Exercise Prescription Changes):   Nutrition:  Target Goals: Understanding of nutrition guidelines, daily intake of sodium 1500mg , cholesterol 200mg , calories 30% from fat and 7% or less from saturated fats, daily to have 5 or more servings of fruits and vegetables.  Biometrics:  Pre Biometrics - 12/02/23 1315       Pre Biometrics   Waist Circumference 33 inches    Hip Circumference 34 inches    Waist to Hip Ratio 0.97 %    Triceps Skinfold 10 mm    % Body Fat 30.5 %    Grip Strength 16 kg    Flexibility 15 in    Single Leg Stand 17.4 seconds              Nutrition Therapy Plan and Nutrition Goals:   Nutrition Assessments:  MEDIFICTS Score Key: >=70 Need to make dietary changes  40-70 Heart Healthy Diet <= 40 Therapeutic Level Cholesterol Diet    Picture Your Plate  Scores: <16 Unhealthy dietary pattern with much room for improvement. 41-50 Dietary pattern unlikely to meet recommendations for good health and room for improvement. 51-60 More healthful dietary pattern, with some room for improvement.  >60 Healthy dietary pattern, although there may be some specific behaviors that could be improved.    Nutrition Goals Re-Evaluation:   Nutrition Goals Re-Evaluation:   Nutrition Goals Discharge (Final Nutrition Goals Re-Evaluation):   Psychosocial: Target Goals: Acknowledge presence or absence of significant depression and/or stress, maximize coping skills, provide positive support system. Participant is able to verbalize types and ability to use techniques and skills needed for reducing stress and depression.  Initial Review & Psychosocial Screening:  Initial Psych Review & Screening - 12/02/23 1425       Initial Review   Current issues with Current Depression;Current Anxiety/Panic;Current Sleep Concerns;Current Stress Concerns    Source of Stress Concerns Chronic Illness    Comments Kenzi shared that she has had some feelings of anxiety, depression, and stress since her surgery. She is taking medication which she says is helping, but she has been feeling down since her surgery. She has had trouble sleeping, stating that she has had nightmares and night sweating that sometimes requires her to change clothes 2-3x a night. She feels anxious at times because she lives alone is is worried something may happen. Support offered, Taylon denies any need for additional resources at this time.      Family Dynamics   Good Support System? Yes   Friends in town     Barriers   Psychosocial barriers to participate in program The patient should benefit from training in stress management and relaxation.      Screening Interventions   Interventions Encouraged to exercise;Provide feedback about the scores to participant;To provide support and resources with  identified psychosocial needs    Expected Outcomes Long Term goal: The participant improves quality of Life and PHQ9 Scores as seen by post scores and/or verbalization of changes;Short Term goal: Identification and review with participant of any Quality of Life or Depression concerns found by scoring the questionnaire.;Long Term Goal: Stressors or current issues are controlled or eliminated.;Short Term goal: Utilizing psychosocial counselor, staff and physician to assist with identification of specific Stressors or current issues interfering with healing process. Setting desired goal for each stressor or current issue identified.  Quality of Life Scores:  Quality of Life - 12/02/23 1542       Quality of Life   Select Quality of Life      Quality of Life Scores   Health/Function Pre 22 %    Socioeconomic Pre 27.86 %    Psych/Spiritual Pre 20.86 %    Family Pre 26.4 %    GLOBAL Pre 23.62 %            Scores of 19 and below usually indicate a poorer quality of life in these areas.  A difference of  2-3 points is a clinically meaningful difference.  A difference of 2-3 points in the total score of the Quality of Life Index has been associated with significant improvement in overall quality of life, self-image, physical symptoms, and general health in studies assessing change in quality of life.  PHQ-9: Review Flowsheet       12/02/2023  Depression screen PHQ 2/9  Decreased Interest 2  Down, Depressed, Hopeless 1  PHQ - 2 Score 3  Altered sleeping 3  Tired, decreased energy 2  Change in appetite 2  Feeling bad or failure about yourself  1  Trouble concentrating 1  Moving slowly or fidgety/restless 0  Suicidal thoughts 0  PHQ-9 Score 12  Difficult doing work/chores Somewhat difficult   Interpretation of Total Score  Total Score Depression Severity:  1-4 = Minimal depression, 5-9 = Mild depression, 10-14 = Moderate depression, 15-19 = Moderately severe  depression, 20-27 = Severe depression   Psychosocial Evaluation and Intervention:   Psychosocial Re-Evaluation:   Psychosocial Discharge (Final Psychosocial Re-Evaluation):   Vocational Rehabilitation: Provide vocational rehab assistance to qualifying candidates.   Vocational Rehab Evaluation & Intervention:  Vocational Rehab - 12/02/23 1432       Initial Vocational Rehab Evaluation & Intervention   Assessment shows need for Vocational Rehabilitation No   Retired Runner, broadcasting/film/video            Education: Education Goals: Education classes will be provided on a weekly basis, covering required topics. Participant will state understanding/return demonstration of topics presented.     Core Videos: Exercise    Move It!  Clinical staff conducted group or individual video education with verbal and written material and guidebook.  Patient learns the recommended Pritikin exercise program. Exercise with the goal of living a long, healthy life. Some of the health benefits of exercise include controlled diabetes, healthier blood pressure levels, improved cholesterol levels, improved heart and lung capacity, improved sleep, and better body composition. Everyone should speak with their doctor before starting or changing an exercise routine.  Biomechanical Limitations Clinical staff conducted group or individual video education with verbal and written material and guidebook.  Patient learns how biomechanical limitations can impact exercise and how we can mitigate and possibly overcome limitations to have an impactful and balanced exercise routine.  Body Composition Clinical staff conducted group or individual video education with verbal and written material and guidebook.  Patient learns that body composition (ratio of muscle mass to fat mass) is a key component to assessing overall fitness, rather than body weight alone. Increased fat mass, especially visceral belly fat, can put [place] at increased  risk for metabolic syndrome, type 2 diabetes, heart disease, and even death. It is recommended to combine diet and exercise (cardiovascular and resistance training) to improve your body composition. Seek guidance from your physician and exercise physiologist before implementing an exercise routine.  Exercise Action Plan Clinical staff conducted group or individual  video education with verbal and written material and guidebook.  Patient learns the recommended strategies to achieve and enjoy long-term exercise adherence, including variety, self-motivation, self-efficacy, and positive decision making. Benefits of exercise include fitness, good health, weight management, more energy, better sleep, less stress, and overall well-being.  Medical   Heart Disease Risk Reduction Clinical staff conducted group or individual video education with verbal and written material and guidebook.  Patient learns our heart is our most vital organ as it circulates oxygen, nutrients, white blood cells, and hormones throughout the entire body, and carries waste away. Data supports a plant-based eating plan like the Pritikin Program for its effectiveness in slowing progression of and reversing heart disease. The video provides a number of recommendations to address heart disease.   Metabolic Syndrome and Belly Fat  Clinical staff conducted group or individual video education with verbal and written material and guidebook.  Patient learns what metabolic syndrome is, how it leads to heart disease, and how one can reverse it and keep it from coming back. You have metabolic syndrome if you have 3 of the following 5 criteria: abdominal obesity, high blood pressure, high triglycerides, low HDL cholesterol, and high blood sugar.  Hypertension and Heart Disease Clinical staff conducted group or individual video education with verbal and written material and guidebook.  Patient learns that high blood pressure, or hypertension, is  very common in the [place]. Hypertension is largely due to excessive salt intake, but other important risk factors include being overweight, physical inactivity, drinking too much alcohol, smoking, and not eating enough potassium from fruits and vegetables. High blood pressure is a leading risk factor for heart attack, stroke, congestive heart failure, dementia, kidney failure, and premature death. Long-term effects of excessive salt intake include stiffening of the arteries and thickening of heart muscle and organ damage. Recommendations include ways to reduce hypertension and the risk of heart disease.  Diseases of Our Time - Focusing on Diabetes Clinical staff conducted group or individual video education with verbal and written material and guidebook.  Patient learns why the best way to stop diseases of our time is prevention, through food and other lifestyle changes. Medicine (such as prescription pills and surgeries) is often only a Band-Aid on the problem, not a long-term solution. Most common diseases of our time include obesity, type 2 diabetes, hypertension, heart disease, and cancer. The Pritikin Program is recommended and has been proven to help reduce, reverse, and/or prevent the damaging effects of metabolic syndrome.  Nutrition   Overview of the Pritikin Eating Plan  Clinical staff conducted group or individual video education with verbal and written material and guidebook.  Patient learns about the Pritikin Eating Plan for disease risk reduction. The Pritikin Eating Plan emphasizes a wide variety of unrefined, minimally-processed carbohydrates, like fruits, vegetables, whole grains, and legumes. Go, Caution, and Stop food choices are explained. Plant-based and lean animal proteins are emphasized. Rationale provided for low sodium intake for blood pressure control, low added sugars for blood sugar stabilization, and low added fats and oils for coronary artery disease risk reduction and  weight management.  Calorie Density  Clinical staff conducted group or individual video education with verbal and written material and guidebook.  Patient learns about calorie density and how it impacts the Pritikin Eating Plan. Knowing the characteristics of the food you choose will help you decide whether those foods will lead to weight gain or weight loss, and whether you want to consume more or less of them.  Weight loss is usually a side effect of the Pritikin Eating Plan because of its focus on low calorie-dense foods.  Label Reading  Clinical staff conducted group or individual video education with verbal and written material and guidebook.  Patient learns about the Pritikin recommended label reading guidelines and corresponding recommendations regarding calorie density, added sugars, sodium content, and whole grains.  Dining Out - Part 1  Clinical staff conducted group or individual video education with verbal and written material and guidebook.  Patient learns that restaurant meals can be sabotaging because they can be so high in calories, fat, sodium, and/or sugar. Patient learns recommended strategies on how to positively address this and avoid unhealthy pitfalls.  Facts on Fats  Clinical staff conducted group or individual video education with verbal and written material and guidebook.  Patient learns that lifestyle modifications can be just as effective, if not more so, as many medications for lowering your risk of heart disease. A Pritikin lifestyle can help to reduce your risk of inflammation and atherosclerosis (cholesterol build-up, or plaque, in the artery walls). Lifestyle interventions such as dietary choices and physical activity address the cause of atherosclerosis. A review of the types of fats and their impact on blood cholesterol levels, along with dietary recommendations to reduce fat intake is also included.  Nutrition Action Plan  Clinical staff conducted group or  individual video education with verbal and written material and guidebook.  Patient learns how to incorporate Pritikin recommendations into their lifestyle. Recommendations include planning and keeping personal health goals in mind as an important part of their success.  Healthy Mind-Set    Healthy Minds, Bodies, Hearts  Clinical staff conducted group or individual video education with verbal and written material and guidebook.  Patient learns how to identify when they are stressed. Video will discuss the impact of that stress, as well as the many benefits of stress management. Patient will also be introduced to stress management techniques. The way we think, act, and feel has an impact on our hearts.  How Our Thoughts Can Heal Our Hearts  Clinical staff conducted group or individual video education with verbal and written material and guidebook.  Patient learns that negative thoughts can cause depression and anxiety. This can result in negative lifestyle behavior and serious health problems. Cognitive behavioral therapy is an effective method to help control our thoughts in order to change and improve our emotional outlook.  Additional Videos:  Exercise    Improving Performance  Clinical staff conducted group or individual video education with verbal and written material and guidebook.  Patient learns to use a non-linear approach by alternating intensity levels and lengths of time spent exercising to help burn more calories and lose more body fat. Cardiovascular exercise helps improve heart health, metabolism, hormonal balance, blood sugar control, and recovery from fatigue. Resistance training improves strength, endurance, balance, coordination, reaction time, metabolism, and muscle mass. Flexibility exercise improves circulation, posture, and balance. Seek guidance from your physician and exercise physiologist before implementing an exercise routine and learn your capabilities and proper form for  all exercise.  Introduction to Yoga  Clinical staff conducted group or individual video education with verbal and written material and guidebook.  Patient learns about yoga, a discipline of the coming together of mind, breath, and body. The benefits of yoga include improved flexibility, improved range of motion, better posture and core strength, increased lung function, weight loss, and positive self-image. Yoga's heart health benefits include lowered blood pressure, healthier heart rate,  decreased cholesterol and triglyceride levels, improved immune function, and reduced stress. Seek guidance from your physician and exercise physiologist before implementing an exercise routine and learn your capabilities and proper form for all exercise.  Medical   Aging: Enhancing Your Quality of Life  Clinical staff conducted group or individual video education with verbal and written material and guidebook.  Patient learns key strategies and recommendations to stay in good physical health and enhance quality of life, such as prevention strategies, having an advocate, securing a Health Care Proxy and Power of Attorney, and keeping a list of medications and system for tracking them. It also discusses how to avoid risk for bone loss.  Biology of Weight Control  Clinical staff conducted group or individual video education with verbal and written material and guidebook.  Patient learns that weight gain occurs because we consume more calories than we burn (eating more, moving less). Even if your body weight is normal, you may have higher ratios of fat compared to muscle mass. Too much body fat puts you at increased risk for cardiovascular disease, heart attack, stroke, type 2 diabetes, and obesity-related cancers. In addition to exercise, following the Pritikin Eating Plan can help reduce your risk.  Decoding Lab Results  Clinical staff conducted group or individual video education with verbal and written material and  guidebook.  Patient learns that lab test reflects one measurement whose values change over time and are influenced by many factors, including medication, stress, sleep, exercise, food, hydration, pre-existing medical conditions, and more. It is recommended to use the knowledge from this video to become more involved with your lab results and evaluate your numbers to speak with your doctor.   Diseases of Our Time - Overview  Clinical staff conducted group or individual video education with verbal and written material and guidebook.  Patient learns that according to the CDC, 50% to 70% of chronic diseases (such as obesity, type 2 diabetes, elevated lipids, hypertension, and heart disease) are avoidable through lifestyle improvements including healthier food choices, listening to satiety cues, and increased physical activity.  Sleep Disorders Clinical staff conducted group or individual video education with verbal and written material and guidebook.  Patient learns how good quality and duration of sleep are important to overall health and well-being. Patient also learns about sleep disorders and how they impact health along with recommendations to address them, including discussing with a physician.  Nutrition  Dining Out - Part 2 Clinical staff conducted group or individual video education with verbal and written material and guidebook.  Patient learns how to plan ahead and communicate in order to maximize their dining experience in a healthy and nutritious manner. Included are recommended food choices based on the type of restaurant the patient is visiting.   Fueling a Banker conducted group or individual video education with verbal and written material and guidebook.  There is a strong connection between our food choices and our health. Diseases like obesity and type 2 diabetes are very prevalent and are in large-part due to lifestyle choices. The Pritikin Eating Plan  provides plenty of food and hunger-curbing satisfaction. It is easy to follow, affordable, and helps reduce health risks.  Menu Workshop  Clinical staff conducted group or individual video education with verbal and written material and guidebook.  Patient learns that restaurant meals can sabotage health goals because they are often packed with calories, fat, sodium, and sugar. Recommendations include strategies to plan ahead and to communicate with the manager,  chef, or server to help order a healthier meal.  Planning Your Eating Strategy  Clinical staff conducted group or individual video education with verbal and written material and guidebook.  Patient learns about the Pritikin Eating Plan and its benefit of reducing the risk of disease. The Pritikin Eating Plan does not focus on calories. Instead, it emphasizes high-quality, nutrient-rich foods. By knowing the characteristics of the foods, we choose, we can determine their calorie density and make informed decisions.  Targeting Your Nutrition Priorities  Clinical staff conducted group or individual video education with verbal and written material and guidebook.  Patient learns that lifestyle habits have a tremendous impact on disease risk and progression. This video provides eating and physical activity recommendations based on your personal health goals, such as reducing LDL cholesterol, losing weight, preventing or controlling type 2 diabetes, and reducing high blood pressure.  Vitamins and Minerals  Clinical staff conducted group or individual video education with verbal and written material and guidebook.  Patient learns different ways to obtain key vitamins and minerals, including through a recommended healthy diet. It is important to discuss all supplements you take with your doctor.   Healthy Mind-Set    Smoking Cessation  Clinical staff conducted group or individual video education with verbal and written material and guidebook.   Patient learns that cigarette smoking and tobacco addiction pose a serious health risk which affects millions of people. Stopping smoking will significantly reduce the risk of heart disease, lung disease, and many forms of cancer. Recommended strategies for quitting are covered, including working with your doctor to develop a successful plan.  Culinary   Becoming a Set designer conducted group or individual video education with verbal and written material and guidebook.  Patient learns that cooking at home can be healthy, cost-effective, quick, and puts them in control. Keys to cooking healthy recipes will include looking at your recipe, assessing your equipment needs, planning ahead, making it simple, choosing cost-effective seasonal ingredients, and limiting the use of added fats, salts, and sugars.  Cooking - Breakfast and Snacks  Clinical staff conducted group or individual video education with verbal and written material and guidebook.  Patient learns how important breakfast is to satiety and nutrition through the entire day. Recommendations include key foods to eat during breakfast to help stabilize blood sugar levels and to prevent overeating at meals later in the day. Planning ahead is also a key component.  Cooking - Educational psychologist conducted group or individual video education with verbal and written material and guidebook.  Patient learns eating strategies to improve overall health, including an approach to cook more at home. Recommendations include thinking of animal protein as a side on your plate rather than center stage and focusing instead on lower calorie dense options like vegetables, fruits, whole grains, and plant-based proteins, such as beans. Making sauces in large quantities to freeze for later and leaving the skin on your vegetables are also recommended to maximize your experience.  Cooking - Healthy Salads and Dressing Clinical staff  conducted group or individual video education with verbal and written material and guidebook.  Patient learns that vegetables, fruits, whole grains, and legumes are the foundations of the Pritikin Eating Plan. Recommendations include how to incorporate each of these in flavorful and healthy salads, and how to create homemade salad dressings. Proper handling of ingredients is also covered. Cooking - Soups and Desserts  Cooking - Soups and Desserts Clinical staff conducted group or  individual video education with verbal and written material and guidebook.  Patient learns that Pritikin soups and desserts make for easy, nutritious, and delicious snacks and meal components that are low in sodium, fat, sugar, and calorie density, while high in vitamins, minerals, and filling fiber. Recommendations include simple and healthy ideas for soups and desserts.   Overview     The Pritikin Solution Program Overview Clinical staff conducted group or individual video education with verbal and written material and guidebook.  Patient learns that the results of the Pritikin Program have been documented in more than 100 articles published in peer-reviewed journals, and the benefits include reducing risk factors for (and, in some cases, even reversing) high cholesterol, high blood pressure, type 2 diabetes, obesity, and more! An overview of the three key pillars of the Pritikin Program will be covered: eating well, doing regular exercise, and having a healthy mind-set.  WORKSHOPS  Exercise: Exercise Basics: Building Your Action Plan Clinical staff led group instruction and group discussion with PowerPoint presentation and patient guidebook. To enhance the learning environment the use of posters, models and videos may be added. At the conclusion of this workshop, patients will comprehend the difference between physical activity and exercise, as well as the benefits of incorporating both, into their routine. Patients will  understand the FITT (Frequency, Intensity, Time, and Type) principle and how to use it to build an exercise action plan. In addition, safety concerns and other considerations for exercise and cardiac rehab will be addressed by the presenter. The purpose of this lesson is to promote a comprehensive and effective weekly exercise routine in order to improve patients' overall level of fitness.   Managing Heart Disease: Your Path to a Healthier Heart Clinical staff led group instruction and group discussion with PowerPoint presentation and patient guidebook. To enhance the learning environment the use of posters, models and videos may be added.At the conclusion of this workshop, patients will understand the anatomy and physiology of the heart. Additionally, they will understand how Pritikin's three pillars impact the risk factors, the progression, and the management of heart disease.  The purpose of this lesson is to provide a high-level overview of the heart, heart disease, and how the Pritikin lifestyle positively impacts risk factors.  Exercise Biomechanics Clinical staff led group instruction and group discussion with PowerPoint presentation and patient guidebook. To enhance the learning environment the use of posters, models and videos may be added. Patients will learn how the structural parts of their bodies function and how these functions impact their daily activities, movement, and exercise. Patients will learn how to promote a neutral spine, learn how to manage pain, and identify ways to improve their physical movement in order to promote healthy living. The purpose of this lesson is to expose patients to common physical limitations that impact physical activity. Participants will learn practical ways to adapt and manage aches and pains, and to minimize their effect on regular exercise. Patients will learn how to maintain good posture while sitting, walking, and lifting.  Balance Training  and Fall Prevention  Clinical staff led group instruction and group discussion with PowerPoint presentation and patient guidebook. To enhance the learning environment the use of posters, models and videos may be added. At the conclusion of this workshop, patients will understand the importance of their sensorimotor skills (vision, proprioception, and the vestibular system) in maintaining their ability to balance as they age. Patients will apply a variety of balancing exercises that are appropriate for their current level  of function. Patients will understand the common causes for poor balance, possible solutions to these problems, and ways to modify their physical environment in order to minimize their fall risk. The purpose of this lesson is to teach patients about the importance of maintaining balance as they age and ways to minimize their risk of falling.  WORKSHOPS   Nutrition:  Fueling a Ship broker led group instruction and group discussion with PowerPoint presentation and patient guidebook. To enhance the learning environment the use of posters, models and videos may be added. Patients will review the foundational principles of the Pritikin Eating Plan and understand what constitutes a serving size in each of the food groups. Patients will also learn Pritikin-friendly foods that are better choices when away from home and review make-ahead meal and snack options. Calorie density will be reviewed and applied to three nutrition priorities: weight maintenance, weight loss, and weight gain. The purpose of this lesson is to reinforce (in a group setting) the key concepts around what patients are recommended to eat and how to apply these guidelines when away from home by planning and selecting Pritikin-friendly options. Patients will understand how calorie density may be adjusted for different weight management goals.  Mindful Eating  Clinical staff led group instruction and group  discussion with PowerPoint presentation and patient guidebook. To enhance the learning environment the use of posters, models and videos may be added. Patients will briefly review the concepts of the Pritikin Eating Plan and the importance of low-calorie dense foods. The concept of mindful eating will be introduced as well as the importance of paying attention to internal hunger signals. Triggers for non-hunger eating and techniques for dealing with triggers will be explored. The purpose of this lesson is to provide patients with the opportunity to review the basic principles of the Pritikin Eating Plan, discuss the value of eating mindfully and how to measure internal cues of hunger and fullness using the Hunger Scale. Patients will also discuss reasons for non-hunger eating and learn strategies to use for controlling emotional eating.  Targeting Your Nutrition Priorities Clinical staff led group instruction and group discussion with PowerPoint presentation and patient guidebook. To enhance the learning environment the use of posters, models and videos may be added. Patients will learn how to determine their genetic susceptibility to disease by reviewing their family history. Patients will gain insight into the importance of diet as part of an overall healthy lifestyle in mitigating the impact of genetics and other environmental insults. The purpose of this lesson is to provide patients with the opportunity to assess their personal nutrition priorities by looking at their family history, their own health history and current risk factors. Patients will also be able to discuss ways of prioritizing and modifying the Pritikin Eating Plan for their highest risk areas  Menu  Clinical staff led group instruction and group discussion with PowerPoint presentation and patient guidebook. To enhance the learning environment the use of posters, models and videos may be added. Using menus brought in from [employer],  or printed from [employer], patients will apply the Pritikin dining out guidelines that were presented in the [employer] video. Patients will also be able to practice these guidelines in a variety of provided scenarios. The purpose of this lesson is to provide patients with the opportunity to practice hands-on learning of the Pritikin Dining Out guidelines with actual menus and practice scenarios.  Label Reading Clinical staff led group instruction and group discussion  with PowerPoint presentation and patient guidebook. To enhance the learning environment the use of posters, models and videos may be added. Patients will review and discuss the Pritikin label reading guidelines presented in Pritikin's Label Reading Educational series video. Using fool labels brought in from local grocery stores and markets, patients will apply the label reading guidelines and determine if the packaged food meet the Pritikin guidelines. The purpose of this lesson is to provide patients with the opportunity to review, discuss, and practice hands-on learning of the Pritikin Label Reading guidelines with actual packaged food labels. Cooking School  Pritikin's [employer] are designed to teach patients ways to prepare quick, simple, and affordable recipes at home. The importance of nutrition's role in chronic disease risk reduction is reflected in its emphasis in the overall Pritikin program. By learning how to prepare essential core Pritikin Eating Plan recipes, patients will increase control over what they eat; be able to customize the flavor of foods without the use of added salt, sugar, or fat; and improve the quality of the food they consume. By learning a set of core recipes which are easily assembled, quickly prepared, and affordable, patients are more likely to prepare more healthy foods at home. These workshops focus on convenient breakfasts, simple entres, side dishes, and desserts which  can be prepared with minimal effort and are consistent with nutrition recommendations for cardiovascular risk reduction. Cooking [employer] are taught by a Armed forces logistics/support/administrative officer (RD) who has been trained by the [employer]. The chef or RD has a clear understanding of the importance of minimizing - if not completely eliminating - added fat, sugar, and sodium in recipes. Throughout the series of Cooking School Workshop sessions, patients will learn about healthy ingredients and efficient methods of cooking to build confidence in their capability to prepare    Cooking School weekly topics:  Adding Flavor- Sodium-Free  Fast and Healthy Breakfasts  Powerhouse Plant-Based Proteins  Satisfying Salads and Dressings  Simple Sides and Sauces  International Cuisine-Spotlight on the [employer] Zones  Delicious Desserts  Savory Soups  [employer] - Meals in a Astronomer Appetizers and Snacks  Comforting Weekend Breakfasts  One-Pot Wonders   Fast Evening Meals  Landscape architect Your Pritikin Plate  WORKSHOPS   Healthy Mindset (Psychosocial):  Focused Goals, Sustainable Changes Clinical staff led group instruction and group discussion with PowerPoint presentation and patient guidebook. To enhance the learning environment the use of posters, models and videos may be added. Patients will be able to apply effective goal setting strategies to establish at least one personal goal, and then take consistent, meaningful action toward that goal. They will learn to identify common barriers to achieving personal goals and develop strategies to overcome them. Patients will also gain an understanding of how our mind-set can impact our ability to achieve goals and the importance of cultivating a positive and growth-oriented mind-set. The purpose of this lesson is to provide patients with a deeper understanding of how to set and achieve personal goals, as well as the tools  and strategies needed to overcome common obstacles which may arise along the way.  From Head to Heart: The Power of a Healthy Outlook  Clinical staff led group instruction and group discussion with PowerPoint presentation and patient guidebook. To enhance the learning environment the use of posters, models and videos may be added. Patients will be able to recognize and describe the impact of emotions and mood on physical health.  They will discover the importance of self-care and explore self-care practices which may work for them. Patients will also learn how to utilize the 4 C's to cultivate a healthier outlook and better manage stress and challenges. The purpose of this lesson is to demonstrate to patients how a healthy outlook is an essential part of maintaining good health, especially as they continue their cardiac rehab journey.  Healthy Sleep for a Healthy Heart Clinical staff led group instruction and group discussion with PowerPoint presentation and patient guidebook. To enhance the learning environment the use of posters, models and videos may be added. At the conclusion of this workshop, patients will be able to demonstrate knowledge of the importance of sleep to overall health, well-being, and quality of life. They will understand the symptoms of, and treatments for, common sleep disorders. Patients will also be able to identify daytime and nighttime behaviors which impact sleep, and they will be able to apply these tools to help manage sleep-related challenges. The purpose of this lesson is to provide patients with a general overview of sleep and outline the importance of quality sleep. Patients will learn about a few of the most common sleep disorders. Patients will also be introduced to the concept of "sleep hygiene," and discover ways to self-manage certain sleeping problems through simple daily behavior changes. Finally, the workshop will motivate patients by clarifying the links between  quality sleep and their goals of heart-healthy living.   Recognizing and Reducing Stress Clinical staff led group instruction and group discussion with PowerPoint presentation and patient guidebook. To enhance the learning environment the use of posters, models and videos may be added. At the conclusion of this workshop, patients will be able to understand the types of stress reactions, differentiate between acute and chronic stress, and recognize the impact that chronic stress has on their health. They will also be able to apply different coping mechanisms, such as reframing negative self-talk. Patients will have the opportunity to practice a variety of stress management techniques, such as deep abdominal breathing, progressive muscle relaxation, and/or guided imagery.  The purpose of this lesson is to educate patients on the role of stress in their lives and to provide healthy techniques for coping with it.  Learning Barriers/Preferences:  Learning Barriers/Preferences - 12/02/23 1432       Learning Barriers/Preferences   Learning Barriers Sight;Hearing   glasses/hearing aids   Learning Preferences Audio;Computer/Internet;Group Instruction;Individual Instruction;Pictoral;Skilled Demonstration;Verbal Instruction;Video;Written Material             Education Topics:  Knowledge Questionnaire Score:  Knowledge Questionnaire Score - 12/02/23 1432       Knowledge Questionnaire Score   Pre Score 22/24             Core Components/Risk Factors/Patient Goals at Admission:  Personal Goals and Risk Factors at Admission - 12/02/23 1432       Core Components/Risk Factors/Patient Goals on Admission    Weight Management Yes;Weight Gain    Intervention Weight Management: Develop a combined nutrition and exercise program designed to reach desired caloric intake, while maintaining appropriate intake of nutrient and fiber, sodium and fats, and appropriate energy expenditure required for the  weight goal.;Weight Management: Provide education and appropriate resources to help participant work on and attain dietary goals.    Expected Outcomes Short Term: Continue to assess and modify interventions until short term weight is achieved;Long Term: Adherence to nutrition and physical activity/exercise program aimed toward attainment of established weight goal;Understanding recommendations for meals to include 15-35%  energy as protein, 25-35% energy from fat, 35-60% energy from carbohydrates, less than 200mg  of dietary cholesterol, 20-35 gm of total fiber daily;Understanding of distribution of calorie intake throughout the day with the consumption of 4-5 meals/snacks;Weight Gain: Understanding of general recommendations for a high calorie, high protein meal plan that promotes weight gain by distributing calorie intake throughout the day with the consumption for 4-5 meals, snacks, and/or supplements    Hypertension Yes    Intervention Provide education on lifestyle modifcations including regular physical activity/exercise, weight management, moderate sodium restriction and increased consumption of fresh fruit, vegetables, and low fat dairy, alcohol moderation, and smoking cessation.;Monitor prescription use compliance.    Expected Outcomes Short Term: Continued assessment and intervention until BP is < 140/59mm HG in hypertensive participants. < 130/62mm HG in hypertensive participants with diabetes, heart failure or chronic kidney disease.;Long Term: Maintenance of blood pressure at goal levels.    Lipids Yes    Intervention Provide education and support for participant on nutrition & aerobic/resistive exercise along with prescribed medications to achieve LDL 70mg , HDL >40mg .    Expected Outcomes Short Term: Participant states understanding of desired cholesterol values and is compliant with medications prescribed. Participant is following exercise prescription and nutrition guidelines.;Long Term:  Cholesterol controlled with medications as prescribed, with individualized exercise RX and with personalized nutrition plan. Value goals: LDL < 70mg , HDL > 40 mg.    Stress Yes    Intervention Offer individual and/or small group education and counseling on adjustment to heart disease, stress management and health-related lifestyle change. Teach and support self-help strategies.;Refer participants experiencing significant psychosocial distress to appropriate mental health specialists for further evaluation and treatment. When possible, include family members and significant others in education/counseling sessions.    Expected Outcomes Short Term: Participant demonstrates changes in health-related behavior, relaxation and other stress management skills, ability to obtain effective social support, and compliance with psychotropic medications if prescribed.;Long Term: Emotional wellbeing is indicated by absence of clinically significant psychosocial distress or social isolation.             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 Introduction to [facility] education/intensive cardiac rehab. Initial orientation packet reviewed with patient.                Comments: Participant attended orientation for the cardiac rehabilitation program on  12/02/2023  to perform initial intake and exercise walk test. Patient introduced to the [facility] education and orientation packet was reviewed. Completed 6-minute walk test, measurements, initial ITP, and exercise prescription. Vital signs stable. Telemetry-normal sinus rhythm, asymptomatic. Pt stated she felt "wobbly" when standing after , standing BP checked 114/66. Pt stated "wobbly" feeling resolved after a few seconds, stating she had not  eaten or drank water today. Pt encouraged to eat and drink at least 8-16oz of water before class when she returns for exercise.   Service time was from 1320 to 1509.  [name], MS, ACSM-CEP 12/02/2023 3:49 PM

## 2023-12-09 ENCOUNTER — Encounter (HOSPITAL_COMMUNITY)
Admission: RE | Admit: 2023-12-09 | Discharge: 2023-12-09 | Disposition: A | Discharge status: Home / Self Care | Source: Ambulatory Visit | Attending: Cardiology

## 2023-12-09 DIAGNOSIS — Z952 Presence of prosthetic heart valve: Secondary | ICD-10-CM

## 2023-12-09 DIAGNOSIS — Z48812 Encounter for surgical aftercare following surgery on the circulatory system: Secondary | ICD-10-CM | POA: Diagnosis not present

## 2023-12-09 NOTE — Progress Notes (Signed)
 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 (Patient taking differently: Take 325 mg by mouth daily. Patient reports only taking 81mg ), Disp: 100 tablet, Rfl: 3   Bacillus Coagulans-Inulin (PROBIOTIC-PREBIOTIC PO), Take 1 tablet by mouth daily., Disp: , Rfl:    buPROPion (WELLBUTRIN XL) 150 MG 24 hr tablet, Take 300 mg by mouth every morning., Disp: , Rfl: 3   carvedilol (COREG) 12.5 MG tablet, Take 1 tablet (12.5 mg total) by mouth 2 (two) times daily with a meal., Disp: 60 tablet, Rfl: 2   mirtazapine (REMERON) 45 MG tablet, Take 45 mg by mouth at bedtime., Disp: , Rfl:    pantoprazole (PROTONIX) 40 MG tablet, TAKE 1 TABLET(40 MG) BY MOUTH TWICE DAILY BEFORE A MEAL, Disp: 180 tablet, Rfl: 3   rosuvastatin (CRESTOR) 10 MG tablet, Take 10 mg by mouth daily., Disp: , Rfl:    Suvorexant (BELSOMRA) 20 MG TABS, Take 20 mg by mouth at bedtime., Disp: , Rfl:    temazepam (RESTORIL) 15 MG capsule, Take 15 mg by mouth at bedtime as needed for sleep., Disp: , Rfl:   Past Medical History: Past Medical History:  Diagnosis Date   Allergy     Anxiety    Arthritis    Chest pain    a. 03/2015 Lexiscan MV: EF 59%, no ischemia/infarct.   Colon polyps    COVID-19 virus infection    09-22-2020, 10/2022 fully vax'd   Depression    Family history of adverse reaction to anesthesia    daughter has PONV   GERD (gastroesophageal reflux disease)    Heart murmur    Hemorrhoids    Hyperlipidemia    Hypertension    Moderate aortic insufficiency    a. 03/2015 Echo: EF 55-60%, no rwma, mild LVH, Gr 1 DD, mild AS, mod AI (bicuspid AoV by report, poorly visualized on this study), Asc Ao diam 44mm, triv MR.    Tobacco Use: Social History   Tobacco Use  Smoking Status Former   Current packs/day: 0.50   Average packs/day: 0.5 packs/day for 15.0 years (7.5 ttl pk-yrs)   Types: Cigarettes  Smokeless Tobacco Never    Labs: Review Flowsheet       Latest Ref Rng & Units 07/19/2016 10/03/2023 10/28/2023 10/30/2023  Labs for ITP Cardiac and Pulmonary Rehab  Cholestrol <200 mg/dL 409  - - -  LDL (calc) mg/dL 89  - - -  HDL-C >81 mg/dL 86  - - -  Trlycerides <150 mg/dL 77  - - -  Hemoglobin X9J 4.8 - 5.6 % - - 5.2  -  PH,  Arterial 7.35 - 7.45 - 7.367  - 7.332  7.260  7.322  7.397  7.323  7.316   PCO2 arterial 32 - 48 mmHg - 35.8  - 37.4  43.0  41.2  36.4  39.6  37.0   Bicarbonate 20.0 - 28.0 mmol/L - 21.6  21.6  20.5  - 19.9  19.5  21.3  22.4  21.3  20.5  18.9   TCO2 22 - 32 mmol/L - 23  23  22   - 21  21  23  24  26  23  26  23  22  21  20  22    Acid-base deficit 0.0 - 2.0 mmol/L - 4.0  4.0  4.0  - 6.0  7.0  4.0  2.0  4.0  5.0  7.0   O2 Saturation % - 57  63  92  - 95  93  100  100  70  100  100     Details       Multiple values from one day are sorted in reverse-chronological order         Capillary Blood Glucose: Lab Results  Component Value Date   GLUCAP 147 (H) 11/03/2023   GLUCAP 88 11/03/2023   GLUCAP 131 (H) 11/02/2023   GLUCAP 106 (H) 11/02/2023   GLUCAP 123 (H) 11/02/2023     Exercise Target Goals: Exercise Program  Goal: Individual exercise prescription set using results from initial 6 min walk test and THRR while considering  patient's activity barriers and safety.   Exercise Prescription Goal: Initial exercise prescription builds to 30-45 minutes a day of aerobic activity, 2-3 days per week.  Home exercise guidelines will be given to patient during program as part of exercise prescription that the participant will acknowledge.  Activity Barriers & Risk Stratification:  Activity Barriers & Cardiac Risk Stratification - 12/02/23 1432       Activity Barriers & Cardiac Risk Stratification   Activity Barriers Neck/Spine Problems;Balance Concerns;Deconditioning;Other (comment)    Comments sternal precautions    Cardiac Risk Stratification High   <5 METs on            6 Minute Walk:  6 Minute Walk     Row Name 12/02/23 1541         6 Minute Walk   Phase Initial     Distance 1200 feet     Walk Time 6 minutes     # of Rest Breaks 0     MPH 2.27     METS 2.5     RPE 13     Perceived Dyspnea  0     VO2 Peak 8.76     Symptoms No     Resting HR 82 bpm     Resting BP 106/70     Resting Oxygen Saturation  99 %     Exercise Oxygen Saturation  during 6 min walk 99 %     Max Ex. HR 93 bpm     Max Ex. BP 122/70     2 Minute Post BP 108/68              Oxygen Initial Assessment:   Oxygen Re-Evaluation:   Oxygen Discharge (Final Oxygen Re-Evaluation):   Initial Exercise Prescription:  Initial Exercise Prescription - 12/02/23 1500       Date of Initial Exercise RX and Referring Provider   Date 12/02/23    Referring Provider Peter [place], MD    Expected Discharge Date  02/26/24      NuStep   Level 1    SPM 70    Minutes 15    METs 2      Track   Laps 15    Minutes 15    METs 2      Prescription Details   Frequency (times per week) 3    Duration Progress to 30 minutes of continuous aerobic without signs/symptoms of physical distress      Intensity   THRR  40-80% of Max Heartrate 56-115    Ratings of Perceived Exertion 11-13    Perceived Dyspnea 0-4      Progression   Progression Continue progressive overload as per policy without signs/symptoms or physical distress.      Resistance Training   Training Prescription Yes    Weight 2    Reps 10-15             Perform Capillary Blood Glucose checks as needed.  Exercise Prescription Changes:   Exercise Prescription Changes     Row Name 12/09/23 1623             Response to Exercise   Blood Pressure (Admit) 102/60       Blood Pressure (Exercise) 110/68       Blood Pressure (Exit) 102/60       Heart Rate (Admit) 85 bpm       Heart Rate (Exercise) 90 bpm       Heart Rate (Exit) 85 bpm       Rating of Perceived Exertion (Exercise) 11       Perceived Dyspnea (Exercise) 0       Symptoms 0       Comments Pt first day in the Prit[facility]in ICR program       Duration Progress to 30 minutes of  aerobic without signs/symptoms of physical distress       Intensity THRR unchanged         Progression   Progression Continue to progress workloads to maintain intensity without signs/symptoms of physical distress.       Average METs 2.02  EST. Pt did not report laps or MET's         Resistance Training   Training Prescription Yes       Weight 2       Reps 10-15       Time 10 Minutes         NuStep   Level 1       Minutes 15       METs --  Did not report MET's         Track   Laps 8       Minutes 15       METs --  Did not report laps. Lost count                Exercise Comments:   Exercise Comments     Row Name 12/09/23 1629           Exercise Comments Pt tolerated exercise well with and average MET level of 2.02. This is an estimate becasue MET's were not reported. Pt is off to a good start and is learning her THRR, RPE and ExRx.                Exercise Goals and Review:   Exercise Goals     Row Name 12/02/23 1316  Exercise Goals   Increase  Physical Activity Yes       Intervention Provide advice, education, support and counseling about physical activity/exercise needs.;Develop an individualized exercise prescription for aerobic and resistive training based on initial evaluation findings, risk stratification, comorbidities and participant's personal goals.       Expected Outcomes Short Term: Attend rehab on a regular basis to increase amount of physical activity.;Long Term: Exercising regularly at least 3-5 days a week.;Long Term: Add in home exercise to make exercise part of routine and to increase amount of physical activity.       Increase Strength and Stamina Yes       Intervention Provide advice, education, support and counseling about physical activity/exercise needs.;Develop an individualized exercise prescription for aerobic and resistive training based on initial evaluation findings, risk stratification, comorbidities and participant's personal goals.       Expected Outcomes Short Term: Perform resistance training exercises routinely during rehab and add in resistance training at home;Short Term: Increase workloads from initial exercise prescription for resistance, speed, and METs.;Long Term: Improve cardiorespiratory fitness, muscular endurance and strength as measured by increased METs and functional capacity ( )       Able to understand and use rate of perceived exertion (RPE) scale Yes       Intervention Provide education and explanation on how to use RPE scale       Expected Outcomes Short Term: Able to use RPE daily in rehab to express subjective intensity level;Long Term:  Able to use RPE to guide intensity level when exercising independently       Knowledge and understanding of Target Heart Rate Range (THRR) Yes       Intervention Provide education and explanation of THRR including how the numbers were predicted and where they are located for reference       Expected Outcomes Short Term: Able to state/look up THRR;Short  Term: Able to use daily as guideline for intensity in rehab;Long Term: Able to use THRR to govern intensity when exercising independently       Understanding of Exercise Prescription Yes       Intervention Provide education, explanation, and written materials on patient's individual exercise prescription       Expected Outcomes Short Term: Able to explain program exercise prescription;Long Term: Able to explain home exercise prescription to exercise independently                Exercise Goals Re-Evaluation :  Exercise Goals Re-Evaluation     Row Name 12/09/23 1627             Exercise Goal Re-Evaluation   Exercise Goals Review Increase Physical Activity;Understanding of Exercise Prescription;Increase Strength and Stamina;Knowledge and understanding of Target Heart Rate Range (THRR);Able to understand and use rate of perceived exertion (RPE) scale       Comments Pt tolerated exercise well with and average MET level of 2.02. This is an estimate becasue MET's were not reported. Pt is off to a good start and is learning her THRR, RPE and ExRx       Expected Outcomes Will continue to monitor pt and progress workloads as tolerated without sign or symptom                Discharge Exercise Prescription (Final Exercise Prescription Changes):  Exercise Prescription Changes - 12/09/23 1623       Response to Exercise   Blood Pressure (Admit) 102/60    Blood Pressure (Exercise) 110/68    Blood  Pressure (Exit) 102/60    Heart Rate (Admit) 85 bpm    Heart Rate (Exercise) 90 bpm    Heart Rate (Exit) 85 bpm    Rating of Perceived Exertion (Exercise) 11    Perceived Dyspnea (Exercise) 0    Symptoms 0    Comments Pt first day in the [facility] ICR program    Duration Progress to 30 minutes of  aerobic without signs/symptoms of physical distress    Intensity THRR unchanged      Progression   Progression Continue to progress workloads to maintain intensity without signs/symptoms of  physical distress.    Average METs 2.02   EST. Pt did not report laps or MET's     Resistance Training   Training Prescription Yes    Weight 2    Reps 10-15    Time 10 Minutes      NuStep   Level 1    Minutes 15    METs --   Did not report MET's     Track   Laps 8    Minutes 15    METs --   Did not report laps. Lost count            Nutrition:  Target Goals: Understanding of nutrition guidelines, daily intake of sodium 1500mg , cholesterol 200mg , calories 30% from fat and 7% or less from saturated fats, daily to have 5 or more servings of fruits and vegetables.  Biometrics:  Pre Biometrics - 12/02/23 1315       Pre Biometrics   Waist Circumference 33 inches    Hip Circumference 34 inches    Waist to Hip Ratio 0.97 %    Triceps Skinfold 10 mm    % Body Fat 30.5 %    Grip Strength 16 kg    Flexibility 15 in    Single Leg Stand 17.4 seconds              Nutrition Therapy Plan and Nutrition Goals:  Nutrition Therapy & Goals - 12/09/23 1541       Nutrition Therapy   Diet Heart Healthy Diet    Drug/Food Interactions Statins/Certain Fruits      Personal Nutrition Goals   Nutrition Goal Patient to identify strategies for reducing cardiovascular risk by attending the [facility] education and nutrition series weekly.    Personal Goal #2 Patient to improve diet quality by using the plate method as a guide for meal planning to include lean protein/plant protein, fruits, vegetables, whole grains, nonfat dairy as part of a well-balanced diet.    Comments Eleftheria has medical history of AV replacement, HTN, CAD, hyperlipidemia. LDL is well controlled. She reports no nutrition concerns at this time. She continues regular follow-up with GI. Patient will benefit from participation in intensive cardiac rehab for nutrition, exercise, and lifestyle modification.      Intervention Plan   Intervention Prescribe, educate and counsel regarding individualized specific dietary  modifications aiming towards targeted core components such as weight, hypertension, lipid management, diabetes, heart failure and other comorbidities.;Nutrition handout(s) given to patient.    Expected Outcomes Short Term Goal: Understand basic principles of dietary content, such as calories, fat, sodium, cholesterol and nutrients.;Long Term Goal: Adherence to prescribed nutrition plan.             Nutrition Assessments:  MEDIFICTS Score Key: >=70 Need to make dietary changes  40-70 Heart Healthy Diet <= 40 Therapeutic Level Cholesterol Diet    Picture Your Plate Scores: <13 Unhealthy dietary  pattern with much room for improvement. 41-50 Dietary pattern unlikely to meet recommendations for good health and room for improvement. 51-60 More healthful dietary pattern, with some room for improvement.  >60 Healthy dietary pattern, although there may be some specific behaviors that could be improved.    Nutrition Goals Re-Evaluation:  Nutrition Goals Re-Evaluation     Row Name 12/09/23 1541             Goals   Current Weight 120 lb 13 oz (54.8 kg)       Comment A1c WNL, LDL 53       Expected Outcome Flo has medical history of AV replacement, HTN, CAD, hyperlipidemia. LDL is well controlled. She reports no nutrition concerns at this time. She continues regular follow-up with GI. Patient will benefit from participation in intensive cardiac rehab for nutrition, exercise, and lifestyle modification.                Nutrition Goals Re-Evaluation:  Nutrition Goals Re-Evaluation     Row Name 12/09/23 1541             Goals   Current Weight 120 lb 13 oz (54.8 kg)       Comment A1c WNL, LDL 53       Expected Outcome Kemara has medical history of AV replacement, HTN, CAD, hyperlipidemia. LDL is well controlled. She reports no nutrition concerns at this time. She continues regular follow-up with GI. Patient will benefit from participation in intensive cardiac rehab for  nutrition, exercise, and lifestyle modification.                Nutrition Goals Discharge (Final Nutrition Goals Re-Evaluation):  Nutrition Goals Re-Evaluation - 12/09/23 1541       Goals   Current Weight 120 lb 13 oz (54.8 kg)    Comment A1c WNL, LDL 53    Expected Outcome Amerika has medical history of AV replacement, HTN, CAD, hyperlipidemia. LDL is well controlled. She reports no nutrition concerns at this time. She continues regular follow-up with GI. Patient will benefit from participation in intensive cardiac rehab for nutrition, exercise, and lifestyle modification.             Psychosocial: Target Goals: Acknowledge presence or absence of significant depression and/or stress, maximize coping skills, provide positive support system. Participant is able to verbalize types and ability to use techniques and skills needed for reducing stress and depression.  Initial Review & Psychosocial Screening:  Initial Psych Review & Screening - 12/02/23 1425       Initial Review   Current issues with Current Depression;Current Anxiety/Panic;Current Sleep Concerns;Current Stress Concerns    Source of Stress Concerns Chronic Illness    Comments Eveny shared that she has had some feelings of anxiety, depression, and stress since her surgery. She is taking medication which she says is helping, but she has been feeling down since her surgery. She has had trouble sleeping, stating that she has had nightmares and night sweating that sometimes requires her to change clothes 2-3x a night. She feels anxious at times because she lives alone is is worried something may happen. Support offered, Jashiya denies any need for additional resources at this time.      Family Dynamics   Good Support System? Yes   Friends in town     Barriers   Psychosocial barriers to participate in program The patient should benefit from training in stress management and relaxation.      Screening Interventions  Interventions Encouraged to exercise;Provide feedback about the scores to participant;To provide support and resources with identified psychosocial needs    Expected Outcomes Long Term goal: The participant improves quality of Life and PHQ9 Scores as seen by post scores and/or verbalization of changes;Short Term goal: Identification and review with participant of any Quality of Life or Depression concerns found by scoring the questionnaire.;Long Term Goal: Stressors or current issues are controlled or eliminated.;Short Term goal: Utilizing psychosocial counselor, staff and physician to assist with identification of specific Stressors or current issues interfering with healing process. Setting desired goal for each stressor or current issue identified.             Quality of Life Scores:  Quality of Life - 12/02/23 1542       Quality of Life   Select Quality of Life      Quality of Life Scores   Health/Function Pre 22 %    Socioeconomic Pre 27.86 %    Psych/Spiritual Pre 20.86 %    Family Pre 26.4 %    GLOBAL Pre 23.62 %            Scores of 19 and below usually indicate a poorer quality of life in these areas.  A difference of  2-3 points is a clinically meaningful difference.  A difference of 2-3 points in the total score of the Quality of Life Index has been associated with significant improvement in overall quality of life, self-image, physical symptoms, and general health in studies assessing change in quality of life.  PHQ-9: Review Flowsheet       12/02/2023  Depression screen PHQ 2/9  Decreased Interest 2  Down, Depressed, Hopeless 1  PHQ - 2 Score 3  Altered sleeping 3  Tired, decreased energy 2  Change in appetite 2  Feeling bad or failure about yourself  1  Trouble concentrating 1  Moving slowly or fidgety/restless 0  Suicidal thoughts 0  PHQ-9 Score 12  Difficult doing work/chores Somewhat difficult   Interpretation of Total Score  Total Score Depression  Severity:  1-4 = Minimal depression, 5-9 = Mild depression, 10-14 = Moderate depression, 15-19 = Moderately severe depression, 20-27 = Severe depression   Psychosocial Evaluation and Intervention:   Psychosocial Re-Evaluation:   Psychosocial Discharge (Final Psychosocial Re-Evaluation):   Vocational Rehabilitation: Provide vocational rehab assistance to qualifying candidates.   Vocational Rehab Evaluation & Intervention:  Vocational Rehab - 12/02/23 1432       Initial Vocational Rehab Evaluation & Intervention   Assessment shows need for Vocational Rehabilitation No   Retired Runner, broadcasting/film/video            Education: Education Goals: Education classes will be provided on a weekly basis, covering required topics. Participant will state understanding/return demonstration of topics presented.    Education     Row Name 12/09/23 1400     Education   Cardiac Education Topics [facility]in   Psychologist, forensic Exercise Education   Exercise Education Biomechanial Limitations   Instruction Review Code 1- Verbalizes Understanding   Class Start Time 1400   Class Stop Time 1440   Class Time Calculation (min) 40 min            Core Videos: Exercise    Move It!  Clinical staff conducted group or individual video education with verbal and written material and guidebook.  Patient learns the recommended [facility]ritikin exercise program. Exercise  with the goal of living a long, healthy life. Some of the health benefits of exercise include controlled diabetes, healthier blood pressure levels, improved cholesterol levels, improved heart and lung capacity, improved sleep, and better body composition. Everyone should speak with their doctor before starting or changing an exercise routine.  Biomechanical Limitations Clinical staff conducted group or individual video education with verbal and written material and guidebook.  Patient learns how  biomechanical limitations can impact exercise and how we can mitigate and possibly overcome limitations to have an impactful and balanced exercise routine.  Body Composition Clinical staff conducted group or individual video education with verbal and written material and guidebook.  Patient learns that body composition (ratio of muscle mass to fat mass) is a key component to assessing overall fitness, rather than body weight alone. Increased fat mass, especially visceral belly fat, can put [place] at increased risk for metabolic syndrome, type 2 diabetes, heart disease, and even death. It is recommended to combine diet and exercise (cardiovascular and resistance training) to improve your body composition. Seek guidance from your physician and exercise physiologist before implementing an exercise routine.  Exercise Action Plan Clinical staff conducted group or individual video education with verbal and written material and guidebook.  Patient learns the recommended strategies to achieve and enjoy long-term exercise adherence, including variety, self-motivation, self-efficacy, and positive decision making. Benefits of exercise include fitness, good health, weight management, more energy, better sleep, less stress, and overall well-being.  Medical   Heart Disease Risk Reduction Clinical staff conducted group or individual video education with verbal and written material and guidebook.  Patient learns our heart is our most vital organ as it circulates oxygen, nutrients, white blood cells, and hormones throughout the entire body, and carries waste away. Data supports a plant-based eating plan like the Pritikin Program for its effectiveness in slowing progression of and reversing heart disease. The video provides a number of recommendations to address heart disease.   Metabolic Syndrome and Belly Fat  Clinical staff conducted group or individual video education with verbal and written material and guidebook.   Patient learns what metabolic syndrome is, how it leads to heart disease, and how one can reverse it and keep it from coming back. You have metabolic syndrome if you have 3 of the following 5 criteria: abdominal obesity, high blood pressure, high triglycerides, low HDL cholesterol, and high blood sugar.  Hypertension and Heart Disease Clinical staff conducted group or individual video education with verbal and written material and guidebook.  Patient learns that high blood pressure, or hypertension, is very common in the [place]. Hypertension is largely due to excessive salt intake, but other important risk factors include being overweight, physical inactivity, drinking too much alcohol, smoking, and not eating enough potassium from fruits and vegetables. High blood pressure is a leading risk factor for heart attack, stroke, congestive heart failure, dementia, kidney failure, and premature death. Long-term effects of excessive salt intake include stiffening of the arteries and thickening of heart muscle and organ damage. Recommendations include ways to reduce hypertension and the risk of heart disease.  Diseases of Our Time - Focusing on Diabetes Clinical staff conducted group or individual video education with verbal and written material and guidebook.  Patient learns why the best way to stop diseases of our time is prevention, through food and other lifestyle changes. Medicine (such as prescription pills and surgeries) is often only a Band-Aid on the problem, not a long-term solution. Most common diseases of our time  include obesity, type 2 diabetes, hypertension, heart disease, and cancer. The Pritikin Program is recommended and has been proven to help reduce, reverse, and/or prevent the damaging effects of metabolic syndrome.  Nutrition   Overview of the Pritikin Eating Plan  Clinical staff conducted group or individual video education with verbal and written material and guidebook.  Patient  learns about the Pritikin Eating Plan for disease risk reduction. The Pritikin Eating Plan emphasizes a wide variety of unrefined, minimally-processed carbohydrates, like fruits, vegetables, whole grains, and legumes. Go, Caution, and Stop food choices are explained. Plant-based and lean animal proteins are emphasized. Rationale provided for low sodium intake for blood pressure control, low added sugars for blood sugar stabilization, and low added fats and oils for coronary artery disease risk reduction and weight management.  Calorie Density  Clinical staff conducted group or individual video education with verbal and written material and guidebook.  Patient learns about calorie density and how it impacts the Pritikin Eating Plan. Knowing the characteristics of the food you choose will help you decide whether those foods will lead to weight gain or weight loss, and whether you want to consume more or less of them. Weight loss is usually a side effect of the Pritikin Eating Plan because of its focus on low calorie-dense foods.  Label Reading  Clinical staff conducted group or individual video education with verbal and written material and guidebook.  Patient learns about the Pritikin recommended label reading guidelines and corresponding recommendations regarding calorie density, added sugars, sodium content, and whole grains.  Dining Out - Part 1  Clinical staff conducted group or individual video education with verbal and written material and guidebook.  Patient learns that restaurant meals can be sabotaging because they can be so high in calories, fat, sodium, and/or sugar. Patient learns recommended strategies on how to positively address this and avoid unhealthy pitfalls.  Facts on Fats  Clinical staff conducted group or individual video education with verbal and written material and guidebook.  Patient learns that lifestyle modifications can be just as effective, if not more so, as many  medications for lowering your risk of heart disease. A Pritikin lifestyle can help to reduce your risk of inflammation and atherosclerosis (cholesterol build-up, or plaque, in the artery walls). Lifestyle interventions such as dietary choices and physical activity address the cause of atherosclerosis. A review of the types of fats and their impact on blood cholesterol levels, along with dietary recommendations to reduce fat intake is also included.  Nutrition Action Plan  Clinical staff conducted group or individual video education with verbal and written material and guidebook.  Patient learns how to incorporate Pritikin recommendations into their lifestyle. Recommendations include planning and keeping personal health goals in mind as an important part of their success.  Healthy Mind-Set    Healthy Minds, Bodies, Hearts  Clinical staff conducted group or individual video education with verbal and written material and guidebook.  Patient learns how to identify when they are stressed. Video will discuss the impact of that stress, as well as the many benefits of stress management. Patient will also be introduced to stress management techniques. The way we think, act, and feel has an impact on our hearts.  How Our Thoughts Can Heal Our Hearts  Clinical staff conducted group or individual video education with verbal and written material and guidebook.  Patient learns that negative thoughts can cause depression and anxiety. This can result in negative lifestyle behavior and serious health problems. Cognitive behavioral therapy  is an effective method to help control our thoughts in order to change and improve our emotional outlook.  Additional Videos:  Exercise    Improving Performance  Clinical staff conducted group or individual video education with verbal and written material and guidebook.  Patient learns to use a non-linear approach by alternating intensity levels and lengths of time spent  exercising to help burn more calories and lose more body fat. Cardiovascular exercise helps improve heart health, metabolism, hormonal balance, blood sugar control, and recovery from fatigue. Resistance training improves strength, endurance, balance, coordination, reaction time, metabolism, and muscle mass. Flexibility exercise improves circulation, posture, and balance. Seek guidance from your physician and exercise physiologist before implementing an exercise routine and learn your capabilities and proper form for all exercise.  Introduction to Yoga  Clinical staff conducted group or individual video education with verbal and written material and guidebook.  Patient learns about yoga, a discipline of the coming together of mind, breath, and body. The benefits of yoga include improved flexibility, improved range of motion, better posture and core strength, increased lung function, weight loss, and positive self-image. Yoga's heart health benefits include lowered blood pressure, healthier heart rate, decreased cholesterol and triglyceride levels, improved immune function, and reduced stress. Seek guidance from your physician and exercise physiologist before implementing an exercise routine and learn your capabilities and proper form for all exercise.  Medical   Aging: Enhancing Your Quality of Life  Clinical staff conducted group or individual video education with verbal and written material and guidebook.  Patient learns key strategies and recommendations to stay in good physical health and enhance quality of life, such as prevention strategies, having an advocate, securing a Health Care Proxy and Power of Attorney, and keeping a list of medications and system for tracking them. It also discusses how to avoid risk for bone loss.  Biology of Weight Control  Clinical staff conducted group or individual video education with verbal and written material and guidebook.  Patient learns that weight gain occurs  because we consume more calories than we burn (eating more, moving less). Even if your body weight is normal, you may have higher ratios of fat compared to muscle mass. Too much body fat puts you at increased risk for cardiovascular disease, heart attack, stroke, type 2 diabetes, and obesity-related cancers. In addition to exercise, following the Pritikin Eating Plan can help reduce your risk.  Decoding Lab Results  Clinical staff conducted group or individual video education with verbal and written material and guidebook.  Patient learns that lab test reflects one measurement whose values change over time and are influenced by many factors, including medication, stress, sleep, exercise, food, hydration, pre-existing medical conditions, and more. It is recommended to use the knowledge from this video to become more involved with your lab results and evaluate your numbers to speak with your doctor.   Diseases of Our Time - Overview  Clinical staff conducted group or individual video education with verbal and written material and guidebook.  Patient learns that according to the CDC, 50% to 70% of chronic diseases (such as obesity, type 2 diabetes, elevated lipids, hypertension, and heart disease) are avoidable through lifestyle improvements including healthier food choices, listening to satiety cues, and increased physical activity.  Sleep Disorders Clinical staff conducted group or individual video education with verbal and written material and guidebook.  Patient learns how good quality and duration of sleep are important to overall health and well-being. Patient also learns about sleep disorders and  how they impact health along with recommendations to address them, including discussing with a physician.  Nutrition  Dining Out - Part 2 Clinical staff conducted group or individual video education with verbal and written material and guidebook.  Patient learns how to plan ahead and communicate in  order to maximize their dining experience in a healthy and nutritious manner. Included are recommended food choices based on the type of restaurant the patient is visiting.   Fueling a Banker conducted group or individual video education with verbal and written material and guidebook.  There is a strong connection between our food choices and our health. Diseases like obesity and type 2 diabetes are very prevalent and are in large-part due to lifestyle choices. The Pritikin Eating Plan provides plenty of food and hunger-curbing satisfaction. It is easy to follow, affordable, and helps reduce health risks.  Menu Workshop  Clinical staff conducted group or individual video education with verbal and written material and guidebook.  Patient learns that restaurant meals can sabotage health goals because they are often packed with calories, fat, sodium, and sugar. Recommendations include strategies to plan ahead and to communicate with the manager, chef, or server to help order a healthier meal.  Planning Your Eating Strategy  Clinical staff conducted group or individual video education with verbal and written material and guidebook.  Patient learns about the Pritikin Eating Plan and its benefit of reducing the risk of disease. The Pritikin Eating Plan does not focus on calories. Instead, it emphasizes high-quality, nutrient-rich foods. By knowing the characteristics of the foods, we choose, we can determine their calorie density and make informed decisions.  Targeting Your Nutrition Priorities  Clinical staff conducted group or individual video education with verbal and written material and guidebook.  Patient learns that lifestyle habits have a tremendous impact on disease risk and progression. This video provides eating and physical activity recommendations based on your personal health goals, such as reducing LDL cholesterol, losing weight, preventing or controlling type 2  diabetes, and reducing high blood pressure.  Vitamins and Minerals  Clinical staff conducted group or individual video education with verbal and written material and guidebook.  Patient learns different ways to obtain key vitamins and minerals, including through a recommended healthy diet. It is important to discuss all supplements you take with your doctor.   Healthy Mind-Set    Smoking Cessation  Clinical staff conducted group or individual video education with verbal and written material and guidebook.  Patient learns that cigarette smoking and tobacco addiction pose a serious health risk which affects millions of people. Stopping smoking will significantly reduce the risk of heart disease, lung disease, and many forms of cancer. Recommended strategies for quitting are covered, including working with your doctor to develop a successful plan.  Culinary   Becoming a Set designer conducted group or individual video education with verbal and written material and guidebook.  Patient learns that cooking at home can be healthy, cost-effective, quick, and puts them in control. Keys to cooking healthy recipes will include looking at your recipe, assessing your equipment needs, planning ahead, making it simple, choosing cost-effective seasonal ingredients, and limiting the use of added fats, salts, and sugars.  Cooking - Breakfast and Snacks  Clinical staff conducted group or individual video education with verbal and written material and guidebook.  Patient learns how important breakfast is to satiety and nutrition through the entire day. Recommendations include key foods to eat during breakfast to  help stabilize blood sugar levels and to prevent overeating at meals later in the day. Planning ahead is also a key component.  Cooking - Educational psychologist conducted group or individual video education with verbal and written material and guidebook.  Patient learns eating  strategies to improve overall health, including an approach to cook more at home. Recommendations include thinking of animal protein as a side on your plate rather than center stage and focusing instead on lower calorie dense options like vegetables, fruits, whole grains, and plant-based proteins, such as beans. Making sauces in large quantities to freeze for later and leaving the skin on your vegetables are also recommended to maximize your experience.  Cooking - Healthy Salads and Dressing Clinical staff conducted group or individual video education with verbal and written material and guidebook.  Patient learns that vegetables, fruits, whole grains, and legumes are the foundations of the Pritikin Eating Plan. Recommendations include how to incorporate each of these in flavorful and healthy salads, and how to create homemade salad dressings. Proper handling of ingredients is also covered. Cooking - Soups and [employer] - Soups and Desserts Clinical staff conducted group or individual video education with verbal and written material and guidebook.  Patient learns that Pritikin soups and desserts make for easy, nutritious, and delicious snacks and meal components that are low in sodium, fat, sugar, and calorie density, while high in vitamins, minerals, and filling fiber. Recommendations include simple and healthy ideas for soups and desserts.   Overview     The Pritikin Solution Program Overview Clinical staff conducted group or individual video education with verbal and written material and guidebook.  Patient learns that the results of the Pritikin Program have been documented in more than 100 articles published in peer-reviewed journals, and the benefits include reducing risk factors for (and, in some cases, even reversing) high cholesterol, high blood pressure, type 2 diabetes, obesity, and more! An overview of the three key pillars of the Pritikin Program will be covered: eating well, doing  regular exercise, and having a healthy mind-set.  WORKSHOPS  Exercise: Exercise Basics: Building Your Action Plan Clinical staff led group instruction and group discussion with PowerPoint presentation and patient guidebook. To enhance the learning environment the use of posters, models and videos may be added. At the conclusion of this workshop, patients will comprehend the difference between physical activity and exercise, as well as the benefits of incorporating both, into their routine. Patients will understand the FITT (Frequency, Intensity, Time, and Type) principle and how to use it to build an exercise action plan. In addition, safety concerns and other considerations for exercise and cardiac rehab will be addressed by the presenter. The purpose of this lesson is to promote a comprehensive and effective weekly exercise routine in order to improve patients' overall level of fitness.   Managing Heart Disease: Your Path to a Healthier Heart Clinical staff led group instruction and group discussion with PowerPoint presentation and patient guidebook. To enhance the learning environment the use of posters, models and videos may be added.At the conclusion of this workshop, patients will understand the anatomy and physiology of the heart. Additionally, they will understand how Pritikin's three pillars impact the risk factors, the progression, and the management of heart disease.  The purpose of this lesson is to provide a high-level overview of the heart, heart disease, and how the Pritikin lifestyle positively impacts risk factors.  Exercise Biomechanics Clinical staff led group instruction and group discussion  with PowerPoint presentation and patient guidebook. To enhance the learning environment the use of posters, models and videos may be added. Patients will learn how the structural parts of their bodies function and how these functions impact their daily activities, movement, and exercise.  Patients will learn how to promote a neutral spine, learn how to manage pain, and identify ways to improve their physical movement in order to promote healthy living. The purpose of this lesson is to expose patients to common physical limitations that impact physical activity. Participants will learn practical ways to adapt and manage aches and pains, and to minimize their effect on regular exercise. Patients will learn how to maintain good posture while sitting, walking, and lifting.  Balance Training and Fall Prevention  Clinical staff led group instruction and group discussion with PowerPoint presentation and patient guidebook. To enhance the learning environment the use of posters, models and videos may be added. At the conclusion of this workshop, patients will understand the importance of their sensorimotor skills (vision, proprioception, and the vestibular system) in maintaining their ability to balance as they age. Patients will apply a variety of balancing exercises that are appropriate for their current level of function. Patients will understand the common causes for poor balance, possible solutions to these problems, and ways to modify their physical environment in order to minimize their fall risk. The purpose of this lesson is to teach patients about the importance of maintaining balance as they age and ways to minimize their risk of falling.  WORKSHOPS   Nutrition:  Fueling a Ship broker led group instruction and group discussion with PowerPoint presentation and patient guidebook. To enhance the learning environment the use of posters, models and videos may be added. Patients will review the foundational principles of the Pritikin Eating Plan and understand what constitutes a serving size in each of the food groups. Patients will also learn Pritikin-friendly foods that are better choices when away from home and review make-ahead meal and snack options. Calorie  density will be reviewed and applied to three nutrition priorities: weight maintenance, weight loss, and weight gain. The purpose of this lesson is to reinforce (in a group setting) the key concepts around what patients are recommended to eat and how to apply these guidelines when away from home by planning and selecting Pritikin-friendly options. Patients will understand how calorie density may be adjusted for different weight management goals.  Mindful Eating  Clinical staff led group instruction and group discussion with PowerPoint presentation and patient guidebook. To enhance the learning environment the use of posters, models and videos may be added. Patients will briefly review the concepts of the Pritikin Eating Plan and the importance of low-calorie dense foods. The concept of mindful eating will be introduced as well as the importance of paying attention to internal hunger signals. Triggers for non-hunger eating and techniques for dealing with triggers will be explored. The purpose of this lesson is to provide patients with the opportunity to review the basic principles of the Pritikin Eating Plan, discuss the value of eating mindfully and how to measure internal cues of hunger and fullness using the Hunger Scale. Patients will also discuss reasons for non-hunger eating and learn strategies to use for controlling emotional eating.  Targeting Your Nutrition Priorities Clinical staff led group instruction and group discussion with PowerPoint presentation and patient guidebook. To enhance the learning environment the use of posters, models and videos may be added. Patients will learn how to determine their genetic susceptibility  to disease by reviewing their family history. Patients will gain insight into the importance of diet as part of an overall healthy lifestyle in mitigating the impact of genetics and other environmental insults. The purpose of this lesson is to provide patients with the  opportunity to assess their personal nutrition priorities by looking at their family history, their own health history and current risk factors. Patients will also be able to discuss ways of prioritizing and modifying the Pritikin Eating Plan for their highest risk areas  Menu  Clinical staff led group instruction and group discussion with PowerPoint presentation and patient guidebook. To enhance the learning environment the use of posters, models and videos may be added. Using menus brought in from [employer], or printed from [employer], patients will apply the Pritikin dining out guidelines that were presented in the [employer] video. Patients will also be able to practice these guidelines in a variety of provided scenarios. The purpose of this lesson is to provide patients with the opportunity to practice hands-on learning of the Pritikin Dining Out guidelines with actual menus and practice scenarios.  Label Reading Clinical staff led group instruction and group discussion with PowerPoint presentation and patient guidebook. To enhance the learning environment the use of posters, models and videos may be added. Patients will review and discuss the Pritikin label reading guidelines presented in Pritikin's Label Reading Educational series video. Using fool labels brought in from local grocery stores and markets, patients will apply the label reading guidelines and determine if the packaged food meet the Pritikin guidelines. The purpose of this lesson is to provide patients with the opportunity to review, discuss, and practice hands-on learning of the Pritikin Label Reading guidelines with actual packaged food labels. Cooking School  Pritikin's [employer] are designed to teach patients ways to prepare quick, simple, and affordable recipes at home. The importance of nutrition's role in chronic disease risk reduction is reflected in its emphasis in the overall  Pritikin program. By learning how to prepare essential core Pritikin Eating Plan recipes, patients will increase control over what they eat; be able to customize the flavor of foods without the use of added salt, sugar, or fat; and improve the quality of the food they consume. By learning a set of core recipes which are easily assembled, quickly prepared, and affordable, patients are more likely to prepare more healthy foods at home. These workshops focus on convenient breakfasts, simple entres, side dishes, and desserts which can be prepared with minimal effort and are consistent with nutrition recommendations for cardiovascular risk reduction. Cooking [employer] are taught by a Armed forces logistics/support/administrative officer (RD) who has been trained by the [employer]. The chef or RD has a clear understanding of the importance of minimizing - if not completely eliminating - added fat, sugar, and sodium in recipes. Throughout the series of Cooking School Workshop sessions, patients will learn about healthy ingredients and efficient methods of cooking to build confidence in their capability to prepare    Cooking School weekly topics:  Adding Flavor- Sodium-Free  Fast and Healthy Breakfasts  Powerhouse Plant-Based Proteins  Satisfying Salads and Dressings  Simple Sides and Sauces  International Cuisine-Spotlight on the [employer] Zones  Delicious Desserts  Savory Soups  [employer] - Meals in a Astronomer Appetizers and Snacks  Comforting Weekend Breakfasts  One-Pot Wonders   Fast Evening Meals  Landscape architect Your Pritikin Plate  WORKSHOPS   Healthy Mindset (  Psychosocial):  Focused Goals, Sustainable Changes Clinical staff led group instruction and group discussion with PowerPoint presentation and patient guidebook. To enhance the learning environment the use of posters, models and videos may be added. Patients will be able to apply effective goal setting strategies  to establish at least one personal goal, and then take consistent, meaningful action toward that goal. They will learn to identify common barriers to achieving personal goals and develop strategies to overcome them. Patients will also gain an understanding of how our mind-set can impact our ability to achieve goals and the importance of cultivating a positive and growth-oriented mind-set. The purpose of this lesson is to provide patients with a deeper understanding of how to set and achieve personal goals, as well as the tools and strategies needed to overcome common obstacles which may arise along the way.  From Head to Heart: The Power of a Healthy Outlook  Clinical staff led group instruction and group discussion with PowerPoint presentation and patient guidebook. To enhance the learning environment the use of posters, models and videos may be added. Patients will be able to recognize and describe the impact of emotions and mood on physical health. They will discover the importance of self-care and explore self-care practices which may work for them. Patients will also learn how to utilize the 4 C's to cultivate a healthier outlook and better manage stress and challenges. The purpose of this lesson is to demonstrate to patients how a healthy outlook is an essential part of maintaining good health, especially as they continue their cardiac rehab journey.  Healthy Sleep for a Healthy Heart Clinical staff led group instruction and group discussion with PowerPoint presentation and patient guidebook. To enhance the learning environment the use of posters, models and videos may be added. At the conclusion of this workshop, patients will be able to demonstrate knowledge of the importance of sleep to overall health, well-being, and quality of life. They will understand the symptoms of, and treatments for, common sleep disorders. Patients will also be able to identify daytime and nighttime behaviors which impact  sleep, and they will be able to apply these tools to help manage sleep-related challenges. The purpose of this lesson is to provide patients with a general overview of sleep and outline the importance of quality sleep. Patients will learn about a few of the most common sleep disorders. Patients will also be introduced to the concept of "sleep hygiene," and discover ways to self-manage certain sleeping problems through simple daily behavior changes. Finally, the workshop will motivate patients by clarifying the links between quality sleep and their goals of heart-healthy living.   Recognizing and Reducing Stress Clinical staff led group instruction and group discussion with PowerPoint presentation and patient guidebook. To enhance the learning environment the use of posters, models and videos may be added. At the conclusion of this workshop, patients will be able to understand the types of stress reactions, differentiate between acute and chronic stress, and recognize the impact that chronic stress has on their health. They will also be able to apply different coping mechanisms, such as reframing negative self-talk. Patients will have the opportunity to practice a variety of stress management techniques, such as deep abdominal breathing, progressive muscle relaxation, and/or guided imagery.  The purpose of this lesson is to educate patients on the role of stress in their lives and to provide healthy techniques for coping with it.  Learning Barriers/Preferences:  Learning Barriers/Preferences - 12/02/23 1432  Learning Barriers/Preferences   Learning Barriers Sight;Hearing   glasses/hearing aids   Academic librarian;Computer/Internet;Group Instruction;Individual Instruction;Pictoral;Skilled Demonstration;Verbal Instruction;Video;Written Material             Education Topics:  Knowledge Questionnaire Score:  Knowledge Questionnaire Score - 12/02/23 1432       Knowledge Questionnaire  Score   Pre Score 22/24             Core Components/Risk Factors/Patient Goals at Admission:  Personal Goals and Risk Factors at Admission - 12/02/23 1432       Core Components/Risk Factors/Patient Goals on Admission    Weight Management Yes    Intervention Weight Management: Develop a combined nutrition and exercise program designed to reach desired caloric intake, while maintaining appropriate intake of nutrient and fiber, sodium and fats, and appropriate energy expenditure required for the weight goal.;Weight Management: Provide education and appropriate resources to help participant work on and attain dietary goals.    Expected Outcomes Short Term: Continue to assess and modify interventions until short term weight is achieved;Long Term: Adherence to nutrition and physical activity/exercise program aimed toward attainment of established weight goal;Understanding recommendations for meals to include 15-35% energy as protein, 25-35% energy from fat, 35-60% energy from carbohydrates, less than 200mg  of dietary cholesterol, 20-35 gm of total fiber daily;Understanding of distribution of calorie intake throughout the day with the consumption of 4-5 meals/snacks    Hypertension Yes    Intervention Provide education on lifestyle modifcations including regular physical activity/exercise, weight management, moderate sodium restriction and increased consumption of fresh fruit, vegetables, and low fat dairy, alcohol moderation, and smoking cessation.;Monitor prescription use compliance.    Expected Outcomes Short Term: Continued assessment and intervention until BP is < 140/41mm HG in hypertensive participants. < 130/64mm HG in hypertensive participants with diabetes, heart failure or chronic kidney disease.;Long Term: Maintenance of blood pressure at goal levels.    Lipids Yes    Intervention Provide education and support for participant on nutrition & aerobic/resistive exercise along with prescribed  medications to achieve LDL 70mg , HDL >40mg .    Expected Outcomes Short Term: Participant states understanding of desired cholesterol values and is compliant with medications prescribed. Participant is following exercise prescription and nutrition guidelines.;Long Term: Cholesterol controlled with medications as prescribed, with individualized exercise RX and with personalized nutrition plan. Value goals: LDL < 70mg , HDL > 40 mg.    Stress Yes    Intervention Offer individual and/or small group education and counseling on adjustment to heart disease, stress management and health-related lifestyle change. Teach and support self-help strategies.;Refer participants experiencing significant psychosocial distress to appropriate mental health specialists for further evaluation and treatment. When possible, include family members and significant others in education/counseling sessions.    Expected Outcomes Short Term: Participant demonstrates changes in health-related behavior, relaxation and other stress management skills, ability to obtain effective social support, and compliance with psychotropic medications if prescribed.;Long Term: Emotional wellbeing is indicated by absence of clinically significant psychosocial distress or social isolation.             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 Introduction to pr[facility]ikin education/intensive cardiac rehab. Initial orientation packet reviewed with patient. 30 Day ITP Review. [place] started cardiac rehab on 12/09/23. Violet Seabury did well with  exercise.               Comments: Pt started cardiac rehab today.  Pt tolerated light exercise without difficulty. VSS, telemetry-Sinus Rhythm, bundle branch block, asymptomatic.  Medication list  reconciled. Pt denies barriers to medicaiton compliance.  PSYCHOSOCIAL ASSESSMENT:  PHQ-12. Will review PHQ 9 in the upcoming week   Pt enjoys spending time with her grandchildren, art classes, listening to music and playing the piano..   Pt oriented to exercise equipment and routine.    Understanding verbalized. Thayer Headings RN BSN

## 2023-12-11 ENCOUNTER — Encounter (HOSPITAL_COMMUNITY)
Admission: RE | Admit: 2023-12-11 | Discharge: 2023-12-11 | Disposition: A | Discharge status: Home / Self Care | Source: Ambulatory Visit | Attending: Cardiology | Admitting: Cardiology

## 2023-12-11 DIAGNOSIS — Z952 Presence of prosthetic heart valve: Secondary | ICD-10-CM

## 2023-12-12 ENCOUNTER — Ambulatory Visit (HOSPITAL_COMMUNITY): Payer: Medicare PPO

## 2023-12-13 ENCOUNTER — Encounter (HOSPITAL_COMMUNITY)
Admission: RE | Admit: 2023-12-13 | Discharge: 2023-12-13 | Disposition: A | Discharge status: Home / Self Care | Source: Ambulatory Visit | Attending: Cardiology | Admitting: Cardiology

## 2023-12-13 DIAGNOSIS — Z952 Presence of prosthetic heart valve: Secondary | ICD-10-CM | POA: Diagnosis not present

## 2023-12-16 ENCOUNTER — Encounter (HOSPITAL_COMMUNITY)
Admission: RE | Admit: 2023-12-16 | Discharge: 2023-12-16 | Disposition: A | Discharge status: Home / Self Care | Source: Ambulatory Visit | Attending: Cardiology

## 2023-12-16 DIAGNOSIS — Z952 Presence of prosthetic heart valve: Secondary | ICD-10-CM

## 2023-12-18 ENCOUNTER — Encounter (HOSPITAL_COMMUNITY)
Admission: RE | Admit: 2023-12-18 | Discharge: 2023-12-18 | Disposition: A | Discharge status: Home / Self Care | Source: Ambulatory Visit | Attending: Cardiology | Admitting: Cardiology

## 2023-12-18 DIAGNOSIS — Z952 Presence of prosthetic heart valve: Secondary | ICD-10-CM | POA: Diagnosis not present

## 2023-12-20 ENCOUNTER — Ambulatory Visit (HOSPITAL_COMMUNITY)
Admission: RE | Admit: 2023-12-20 | Discharge: 2023-12-20 | Disposition: A | Discharge status: Home / Self Care | Source: Ambulatory Visit | Attending: Cardiology | Admitting: Cardiology

## 2023-12-20 ENCOUNTER — Encounter (HOSPITAL_COMMUNITY)

## 2023-12-20 DIAGNOSIS — I351 Nonrheumatic aortic (valve) insufficiency: Secondary | ICD-10-CM | POA: Insufficient documentation

## 2023-12-20 DIAGNOSIS — I7121 Aneurysm of the ascending aorta, without rupture: Secondary | ICD-10-CM | POA: Insufficient documentation

## 2023-12-20 DIAGNOSIS — I517 Cardiomegaly: Secondary | ICD-10-CM | POA: Insufficient documentation

## 2023-12-20 DIAGNOSIS — Z952 Presence of prosthetic heart valve: Secondary | ICD-10-CM | POA: Diagnosis not present

## 2023-12-20 DIAGNOSIS — I340 Nonrheumatic mitral (valve) insufficiency: Secondary | ICD-10-CM | POA: Diagnosis not present

## 2023-12-20 LAB — ECHOCARDIOGRAM COMPLETE
AR max vel: 1.17 cm2
AV Area VTI: 1.13 cm2
AV Area mean vel: 1.07 cm2
AV Mean grad: 7.4 mmHg
AV Peak grad: 12.8 mmHg
Ao pk vel: 1.79 m/s
Area-P 1/2: 3.74 cm2
Calc EF: 50.8 %
MV VTI: 1.59 cm2
S' Lateral: 2.5 cm
Single Plane A2C EF: 52.7 %
Single Plane A4C EF: 49.9 %

## 2023-12-20 NOTE — Progress Notes (Signed)
  Echocardiogram 2D Echocardiogram has been performed.  Ocie Doyne RDCS 12/20/2023, 11:37 AM

## 2023-12-23 ENCOUNTER — Encounter (HOSPITAL_COMMUNITY)
Admission: RE | Admit: 2023-12-23 | Discharge: 2023-12-23 | Disposition: A | Discharge status: Home / Self Care | Source: Ambulatory Visit | Attending: Cardiology | Admitting: Cardiology

## 2023-12-23 ENCOUNTER — Encounter (HOSPITAL_COMMUNITY)

## 2023-12-23 DIAGNOSIS — Z952 Presence of prosthetic heart valve: Secondary | ICD-10-CM

## 2023-12-25 ENCOUNTER — Encounter (HOSPITAL_COMMUNITY)

## 2023-12-25 ENCOUNTER — Encounter (HOSPITAL_COMMUNITY)
Admission: RE | Admit: 2023-12-25 | Discharge: 2023-12-25 | Disposition: A | Discharge status: Home / Self Care | Source: Ambulatory Visit | Attending: Cardiology | Admitting: Cardiology

## 2023-12-25 DIAGNOSIS — Z952 Presence of prosthetic heart valve: Secondary | ICD-10-CM

## 2023-12-25 NOTE — Progress Notes (Signed)
 CARDIAC REHAB PHASE 2  Reviewed home exercise with pt today. Pt is tolerating exercise well. Pt will continue to exercise on her own by walking, stretches, exercise ball and aerobic workout videos for 15-30 minutes per session 1-2 days a week in addition to the 3 days in CRP2. Advised pt on THRR, RPE scale, hydration and temperature/humidity precautions. Reinforced S/S to stop exercise and when to call MD vs 911. Encouraged warm up cool down and stretches with exercise sessions. Pt verbalized understanding, all questions were answered and pt was given a copy to take home.    Darl Edu ACSM-CEP 12/25/2023 4:36 PM

## 2023-12-27 ENCOUNTER — Encounter (HOSPITAL_COMMUNITY)

## 2023-12-27 ENCOUNTER — Encounter (HOSPITAL_COMMUNITY)
Admission: RE | Admit: 2023-12-27 | Discharge: 2023-12-27 | Disposition: A | Discharge status: Home / Self Care | Source: Ambulatory Visit | Attending: Cardiology | Admitting: Cardiology

## 2023-12-27 DIAGNOSIS — Z952 Presence of prosthetic heart valve: Secondary | ICD-10-CM | POA: Diagnosis not present

## 2023-12-30 ENCOUNTER — Encounter (HOSPITAL_COMMUNITY)
Admission: RE | Admit: 2023-12-30 | Discharge: 2023-12-30 | Disposition: A | Discharge status: Home / Self Care | Source: Ambulatory Visit | Attending: Cardiology

## 2023-12-30 DIAGNOSIS — Z952 Presence of prosthetic heart valve: Secondary | ICD-10-CM

## 2024-01-01 ENCOUNTER — Encounter (HOSPITAL_COMMUNITY)
Admission: RE | Admit: 2024-01-01 | Discharge: 2024-01-01 | Disposition: A | Discharge status: Home / Self Care | Source: Ambulatory Visit | Attending: Cardiology | Admitting: Cardiology

## 2024-01-01 DIAGNOSIS — Z952 Presence of prosthetic heart valve: Secondary | ICD-10-CM

## 2024-01-01 NOTE — Progress Notes (Signed)
 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 (Patient taking differently: Take 325 mg by mouth daily. Patient reports only taking 81mg ), Disp: 100 tablet, Rfl: 3   Bacillus Coagulans-Inulin (PROBIOTIC-PREBIOTIC PO), Take 1 tablet by mouth daily., Disp: , Rfl:    buPROPion  (WELLBUTRIN  XL) 150 MG 24 hr tablet, Take 300 mg by mouth every morning., Disp: , Rfl: 3   carvedilol  (COREG ) 12.5 MG tablet, Take 1 tablet (12.5 mg total) by mouth 2 (two) times daily with a meal., Disp: 60 tablet, Rfl: 2   mirtazapine  (REMERON ) 45 MG tablet, Take 45 mg by mouth at bedtime., Disp: , Rfl:    pantoprazole  (PROTONIX ) 40 MG tablet, TAKE 1 TABLET(40 MG) BY MOUTH TWICE DAILY BEFORE A MEAL, Disp: 180 tablet, Rfl: 3   rosuvastatin  (CRESTOR ) 10 MG tablet, Take 10 mg by mouth daily., Disp: , Rfl:    Suvorexant  (BELSOMRA ) 20 MG TABS, Take 20 mg by mouth at bedtime., Disp: , Rfl:    temazepam (RESTORIL) 15 MG capsule, Take 15 mg by mouth at bedtime as needed for sleep., Disp: , Rfl:   Past Medical History: Past Medical History:  Diagnosis Date   Allergy     Anxiety    Arthritis    Chest pain    a. 03/2015 Lexiscan  MV: EF 59%, no ischemia/infarct.   Colon polyps    COVID-19 virus infection    09-22-2020, 10/2022 fully vax'd   Depression    Family history of adverse reaction to anesthesia    daughter has PONV   GERD (gastroesophageal reflux disease)    Heart murmur    Hemorrhoids    Hyperlipidemia    Hypertension    Moderate aortic insufficiency    a. 03/2015 Echo: EF 55-60%, no rwma, mild LVH, Gr 1 DD, mild AS, mod AI (bicuspid AoV by report, poorly visualized on this study), Asc Ao diam 44mm, triv MR.    Tobacco Use: Social History   Tobacco Use  Smoking Status Former   Current packs/day: 0.50   Average packs/day: 0.5 packs/day for 15.0 years (7.5 ttl pk-yrs)   Types: Cigarettes  Smokeless Tobacco Never    Labs: Review Flowsheet       Latest Ref Rng & Units 07/19/2016 10/03/2023 10/28/2023 10/30/2023  Labs for ITP Cardiac and Pulmonary Rehab  Cholestrol <200 mg/dL 409  - - -  LDL (calc) mg/dL 89  - - -  HDL-C >81 mg/dL 86  - - -  Trlycerides <150 mg/dL 77  - - -  Hemoglobin X9J 4.8 - 5.6 % - - 5.2  -  PH,  Arterial 7.35 - 7.45 - 7.367  - 7.332  7.260  7.322  7.397  7.323  7.316   PCO2 arterial 32 - 48 mmHg - 35.8  - 37.4  43.0  41.2  36.4  39.6  37.0   Bicarbonate 20.0 - 28.0 mmol/L - 21.6  21.6  20.5  - 19.9  19.5  21.3  22.4  21.3  20.5  18.9   TCO2 22 - 32 mmol/L - 23  23  22   - 21  21  23  24  26  23  26  23  22  21  20  22    Acid-base deficit 0.0 - 2.0 mmol/L - 4.0  4.0  4.0  - 6.0  7.0  4.0  2.0  4.0  5.0  7.0   O2 Saturation % - 57  63  92  - 95  93  100  100  70  100  100     Details       Multiple values from one day are sorted in reverse-chronological order         Capillary Blood Glucose: Lab Results  Component Value Date   GLUCAP 147 (H) 11/03/2023   GLUCAP 88 11/03/2023   GLUCAP 131 (H) 11/02/2023   GLUCAP 106 (H) 11/02/2023   GLUCAP 123 (H) 11/02/2023     Exercise Target Goals: Exercise Program  Goal: Individual exercise prescription set using results from initial 6 min walk test and THRR while considering  patient's activity barriers and safety.   Exercise Prescription Goal: Initial exercise prescription builds to 30-45 minutes a day of aerobic activity, 2-3 days per week.  Home exercise guidelines will be given to patient during program as part of exercise prescription that the participant will acknowledge.  Activity Barriers & Risk Stratification:  Activity Barriers & Cardiac Risk Stratification - 12/02/23 1432       Activity Barriers & Cardiac Risk Stratification   Activity Barriers Neck/Spine Problems;Balance Concerns;Deconditioning;Other (comment)    Comments sternal precautions    Cardiac Risk Stratification High   <5 METs on            6 Minute Walk:  6 Minute Walk     Row Name 12/02/23 1541         6 Minute Walk   Phase Initial     Distance 1200 feet     Walk Time 6 minutes     # of Rest Breaks 0     MPH 2.27     METS 2.5     RPE 13     Perceived Dyspnea  0     VO2 Peak 8.76     Symptoms No     Resting HR 82 bpm     Resting BP 106/70     Resting Oxygen Saturation  99 %     Exercise Oxygen Saturation  during 6 min walk 99 %     Max Ex. HR 93 bpm     Max Ex. BP 122/70     2 Minute Post BP 108/68              Oxygen Initial Assessment:   Oxygen Re-Evaluation:   Oxygen Discharge (Final Oxygen Re-Evaluation):   Initial Exercise Prescription:  Initial Exercise Prescription - 12/02/23 1500       Date of Initial Exercise RX and Referring Provider   Date 12/02/23    Referring Provider Peter Swaz[place], MD    Expected Discharge Date  02/26/24      NuStep   Level 1    SPM 70    Minutes 15    METs 2      Track   Laps 15    Minutes 15    METs 2      Prescription Details   Frequency (times per week) 3    Duration Progress to 30 minutes of continuous aerobic without signs/symptoms of physical distress      Intensity   THRR  40-80% of Max Heartrate 56-115    Ratings of Perceived Exertion 11-13    Perceived Dyspnea 0-4      Progression   Progression Continue progressive overload as per policy without signs/symptoms or physical distress.      Resistance Training   Training Prescription Yes    Weight 2    Reps 10-15             Perform Capillary Blood Glucose checks as needed.  Exercise Prescription Changes:   Exercise Prescription Changes     Row Name 12/09/23 1623 12/25/23 1628           Response to Exercise   Blood Pressure (Admit) 102/60 108/68      Blood Pressure (Exercise) 110/68 102/58      Blood Pressure (Exit) 102/60 108/70      Heart Rate (Admit) 85 bpm 84 bpm      Heart Rate (Exercise) 90 bpm 108 bpm      Heart Rate (Exit) 85 bpm 85 bpm      Rating of Perceived Exertion (Exercise) 11 12.5      Perceived Dyspnea (Exercise) 0 0      Symptoms 0 0      Comments Pt first day in the [facility] program Reviewed MET's, goals and home ExRx      Duration Progress to 30 minutes of  aerobic without signs/symptoms of physical distress Progress to 30 minutes of  aerobic without signs/symptoms of physical distress      Intensity THRR unchanged THRR unchanged        Progression   Progression Continue to progress workloads to maintain intensity without signs/symptoms of physical distress. Continue to progress workloads to maintain intensity without signs/symptoms of physical distress.      Average METs 2.02  EST. Pt did not report laps or MET's 2.02        Resistance Training   Training Prescription Yes Yes      Weight 2 2      Reps 10-15 10-15      Time 10 Minutes 10 Minutes        NuStep   Level 1 1      SPM -- 99      Minutes 15 15      METs --  Did not report MET's 2.1        Track   Laps 8 16      Minutes 15 15      METs --  Did not report laps. Lost count 3.03        Home Exercise Plan   Plans to continue exercise at -- Home (comment)      Frequency -- Add 2 additional  days to program exercise sessions.      Initial Home Exercises Provided -- 12/25/23               Exercise Comments:   Exercise Comments     Row Name 12/09/23 1629 12/25/23 1635  Exercise Comments Pt tolerated exercise well with and average MET level of 2.02. This is an estimate becasue MET's were not reported. Pt is off to a good start and is learning her THRR, RPE and ExRx. Reviewed MET's, goals and home ExRx. Pt tolerated exercise well with and average MET level of 2.57. She is doing well and progressing MET's and laps on the track. She is very happt with her progress and is gaining stamina and the ability to walk more. She will continue to exercise on her own by walking, doing aerobic workout videos, stretches and using an exercise ball for 15-30 mins 1-2 days               Exercise Goals and Review:   Exercise Goals     Row Name 12/02/23 1316             Exercise Goals   Increase Physical Activity Yes       Intervention Provide advice, education, support and counseling about physical activity/exercise needs.;Develop an individualized exercise prescription for aerobic and resistive training based on initial evaluation findings, risk stratification, comorbidities and participant's personal goals.       Expected Outcomes Short Term: Attend rehab on a regular basis to increase amount of physical activity.;Long Term: Exercising regularly at least 3-5 days a week.;Long Term: Add in home exercise to make exercise part of routine and to increase amount of physical activity.       Increase Strength and Stamina Yes       Intervention Provide advice, education, support and counseling about physical activity/exercise needs.;Develop an individualized exercise prescription for aerobic and resistive training based on initial evaluation findings, risk stratification, comorbidities and participant's personal goals.       Expected Outcomes Short Term: Perform resistance training  exercises routinely during rehab and add in resistance training at home;Short Term: Increase workloads from initial exercise prescription for resistance, speed, and METs.;Long Term: Improve cardiorespiratory fitness, muscular endurance and strength as measured by increased METs and functional capacity ( )       Able to understand and use rate of perceived exertion (RPE) scale Yes       Intervention Provide education and explanation on how to use RPE scale       Expected Outcomes Short Term: Able to use RPE daily in rehab to express subjective intensity level;Long Term:  Able to use RPE to guide intensity level when exercising independently       Knowledge and understanding of Target Heart Rate Range (THRR) Yes       Intervention Provide education and explanation of THRR including how the numbers were predicted and where they are located for reference       Expected Outcomes Short Term: Able to state/look up THRR;Short Term: Able to use daily as guideline for intensity in rehab;Long Term: Able to use THRR to govern intensity when exercising independently       Understanding of Exercise Prescription Yes       Intervention Provide education, explanation, and written materials on patient's individual exercise prescription       Expected Outcomes Short Term: Able to explain program exercise prescription;Long Term: Able to explain home exercise prescription to exercise independently                Exercise Goals Re-Evaluation :  Exercise Goals Re-Evaluation     Row Name 12/09/23 1627 12/25/23 1632           Exercise Goal Re-Evaluation  Exercise Goals Review Increase Physical Activity;Understanding of Exercise Prescription;Increase Strength and Stamina;Knowledge and understanding of Target Heart Rate Range (THRR);Able to understand and use rate of perceived exertion (RPE) scale Increase Physical Activity;Understanding of Exercise Prescription;Increase Strength and Stamina;Knowledge and  understanding of Target Heart Rate Range (THRR);Able to understand and use rate of perceived exertion (RPE) scale      Comments Pt tolerated exercise well with and average MET level of 2.02. This is an estimate becasue MET's were not reported. Pt is off to a good start and is learning her THRR, RPE and ExRx Reviewed MET's, goals and home ExRx. Pt tolerated exercise well with and average MET level of 2.57. She is doing well and progressing MET's and laps on the track. She is very happt with her progress and is gaining stamina and the ability to walk more. She will continue to exercise on her own by walking, doing aerobic workout videos, stretches and using an exercise ball for 15-30 mins 1-2 days      Expected Outcomes Will continue to monitor pt and progress workloads as tolerated without sign or symptom Will continue to monitor pt and progress workloads as tolerated without sign or symptom               Discharge Exercise Prescription (Final Exercise Prescription Changes):  Exercise Prescription Changes - 12/25/23 1628       Response to Exercise   Blood Pressure (Admit) 108/68    Blood Pressure (Exercise) 102/58    Blood Pressure (Exit) 108/70    Heart Rate (Admit) 84 bpm    Heart Rate (Exercise) 108 bpm    Heart Rate (Exit) 85 bpm    Rating of Perceived Exertion (Exercise) 12.5    Perceived Dyspnea (Exercise) 0    Symptoms 0    Comments Reviewed MET's, goals and home ExRx    Duration Progress to 30 minutes of  aerobic without signs/symptoms of physical distress    Intensity THRR unchanged      Progression   Progression Continue to progress workloads to maintain intensity without signs/symptoms of physical distress.    Average METs 2.02      Resistance Training   Training Prescription Yes    Weight 2    Reps 10-15    Time 10 Minutes      NuStep   Level 1    SPM 99    Minutes 15    METs 2.1      Track   Laps 16    Minutes 15    METs 3.03      Home Exercise Plan    Plans to continue exercise at Home (comment)    Frequency Add 2 additional days to program exercise sessions.    Initial Home Exercises Provided 12/25/23             Nutrition:  Target Goals: Understanding of nutrition guidelines, daily intake of sodium 1500mg , cholesterol 200mg , calories 30% from fat and 7% or less from saturated fats, daily to have 5 or more servings of fruits and vegetables.  Biometrics:  Pre Biometrics - 12/02/23 1315       Pre Biometrics   Waist Circumference 33 inches    Hip Circumference 34 inches    Waist to Hip Ratio 0.97 %    Triceps Skinfold 10 mm    % Body Fat 30.5 %    Grip Strength 16 kg    Flexibility 15 in    Single Leg Stand 17.4 seconds  Nutrition Therapy Plan and Nutrition Goals:  Nutrition Therapy & Goals - 12/09/23 1541       Nutrition Therapy   Diet Heart Healthy Diet    Drug/Food Interactions Statins/Certain Fruits      Personal Nutrition Goals   Nutrition Goal Patient to identify strategies for reducing cardiovascular risk by attending the [facility] education and nutrition series weekly.    Personal Goal #2 Patient to improve diet quality by using the plate method as a guide for meal planning to include lean protein/plant protein, fruits, vegetables, whole grains, nonfat dairy as part of a well-balanced diet.    Comments Jaleiah has medical history of AV replacement, HTN, CAD, hyperlipidemia. LDL is well controlled. She reports no nutrition concerns at this time. She continues regular follow-up with GI. Patient will benefit from participation in intensive cardiac rehab for nutrition, exercise, and lifestyle modification.      Intervention Plan   Intervention Prescribe, educate and counsel regarding individualized specific dietary modifications aiming towards targeted core components such as weight, hypertension, lipid management, diabetes, heart failure and other comorbidities.;Nutrition handout(s) given to patient.     Expected Outcomes Short Term Goal: Understand basic principles of dietary content, such as calories, fat, sodium, cholesterol and nutrients.;Long Term Goal: Adherence to prescribed nutrition plan.             Nutrition Assessments:  MEDIFICTS Score Key: >=70 Need to make dietary changes  40-70 Heart Healthy Diet <= 40 Therapeutic Level Cholesterol Diet    Picture Your Plate Scores: <16 Unhealthy dietary pattern with much room for improvement. 41-50 Dietary pattern unlikely to meet recommendations for good health and room for improvement. 51-60 More healthful dietary pattern, with some room for improvement.  >60 Healthy dietary pattern, although there may be some specific behaviors that could be improved.    Nutrition Goals Re-Evaluation:  Nutrition Goals Re-Evaluation     Row Name 12/09/23 1541             Goals   Current Weight 120 lb 13 oz (54.8 kg)       Comment A1c WNL, LDL 53       Expected Outcome Leela has medical history of AV replacement, HTN, CAD, hyperlipidemia. LDL is well controlled. She reports no nutrition concerns at this time. She continues regular follow-up with GI. Patient will benefit from participation in intensive cardiac rehab for nutrition, exercise, and lifestyle modification.                Nutrition Goals Re-Evaluation:  Nutrition Goals Re-Evaluation     Row Name 12/09/23 1541             Goals   Current Weight 120 lb 13 oz (54.8 kg)       Comment A1c WNL, LDL 53       Expected Outcome Lafaye has medical history of AV replacement, HTN, CAD, hyperlipidemia. LDL is well controlled. She reports no nutrition concerns at this time. She continues regular follow-up with GI. Patient will benefit from participation in intensive cardiac rehab for nutrition, exercise, and lifestyle modification.                Nutrition Goals Discharge (Final Nutrition Goals Re-Evaluation):  Nutrition Goals Re-Evaluation - 12/09/23 1541       Goals    Current Weight 120 lb 13 oz (54.8 kg)    Comment A1c WNL, LDL 53    Expected Outcome Zadia has medical history of AV replacement, HTN, CAD, hyperlipidemia. LDL  is well controlled. She reports no nutrition concerns at this time. She continues regular follow-up with GI. Patient will benefit from participation in intensive cardiac rehab for nutrition, exercise, and lifestyle modification.             Psychosocial: Target Goals: Acknowledge presence or absence of significant depression and/or stress, maximize coping skills, provide positive support system. Participant is able to verbalize types and ability to use techniques and skills needed for reducing stress and depression.  Initial Review & Psychosocial Screening:  Initial Psych Review & Screening - 12/02/23 1425       Initial Review   Current issues with Current Depression;Current Anxiety/Panic;Current Sleep Concerns;Current Stress Concerns    Source of Stress Concerns Chronic Illness    Comments Magdalene shared that she has had some feelings of anxiety, depression, and stress since her surgery. She is taking medication which she says is helping, but she has been feeling down since her surgery. She has had trouble sleeping, stating that she has had nightmares and night sweating that sometimes requires her to change clothes 2-3x a night. She feels anxious at times because she lives alone is is worried something may happen. Support offered, Dagny denies any need for additional resources at this time.      Family Dynamics   Good Support System? Yes   Friends in town     Barriers   Psychosocial barriers to participate in program The patient should benefit from training in stress management and relaxation.      Screening Interventions   Interventions Encouraged to exercise;Provide feedback about the scores to participant;To provide support and resources with identified psychosocial needs    Expected Outcomes Long Term goal: The participant  improves quality of Life and PHQ9 Scores as seen by post scores and/or verbalization of changes;Short Term goal: Identification and review with participant of any Quality of Life or Depression concerns found by scoring the questionnaire.;Long Term Goal: Stressors or current issues are controlled or eliminated.;Short Term goal: Utilizing psychosocial counselor, staff and physician to assist with identification of specific Stressors or current issues interfering with healing process. Setting desired goal for each stressor or current issue identified.             Quality of Life Scores:  Quality of Life - 12/02/23 1542       Quality of Life   Select Quality of Life      Quality of Life Scores   Health/Function Pre 22 %    Socioeconomic Pre 27.86 %    Psych/Spiritual Pre 20.86 %    Family Pre 26.4 %    GLOBAL Pre 23.62 %            Scores of 19 and below usually indicate a poorer quality of life in these areas.  A difference of  2-3 points is a clinically meaningful difference.  A difference of 2-3 points in the total score of the Quality of Life Index has been associated with significant improvement in overall quality of life, self-image, physical symptoms, and general health in studies assessing change in quality of life.  PHQ-9: Review Flowsheet       12/02/2023  Depression screen PHQ 2/9  Decreased Interest 2  Down, Depressed, Hopeless 1  PHQ - 2 Score 3  Altered sleeping 3  Tired, decreased energy 2  Change in appetite 2  Feeling bad or failure about yourself  1  Trouble concentrating 1  Moving slowly or fidgety/restless 0  Suicidal thoughts  0  PHQ-9 Score 12  Difficult doing work/chores Somewhat difficult   Interpretation of Total Score  Total Score Depression Severity:  1-4 = Minimal depression, 5-9 = Mild depression, 10-14 = Moderate depression, 15-19 = Moderately severe depression, 20-27 = Severe depression   Psychosocial Evaluation and  Intervention:   Psychosocial Re-Evaluation:  Psychosocial Re-Evaluation     Row Name 01/01/24 [phone]             Psychosocial Re-Evaluation   Current issues with Current Depression;Current Anxiety/Panic;Current Sleep Concerns;Current Stress Concerns       Comments PH9 Reviewed. Jacie reports that she is not feeling as depressed. Melora reports that she is sleeping better. Priti says that she will reach out for counseling if needed.       Expected Outcomes Johnnisha will have controlled or decreased stress upon completion of cardiac rehab       Interventions Stress management education;Encouraged to attend Cardiac Rehabilitation for the exercise;Relaxation education       Continue Psychosocial Services  Follow up required by staff         Initial Review   Source of Stress Concerns Chronic Illness       Comments Will continue to monitor and offer support as needed                Psychosocial Discharge (Final Psychosocial Re-Evaluation):  Psychosocial Re-Evaluation - 01/01/24 0836       Psychosocial Re-Evaluation   Current issues with Current Depression;Current Anxiety/Panic;Current Sleep Concerns;Current Stress Concerns    Comments PH9 Reviewed. Darien reports that she is not feeling as depressed. Omunique reports that she is sleeping better. Whittany says that she will reach out for counseling if needed.    Expected Outcomes Bettejane will have controlled or decreased stress upon completion of cardiac rehab    Interventions Stress management education;Encouraged to attend Cardiac Rehabilitation for the exercise;Relaxation education    Continue Psychosocial Services  Follow up required by staff      Initial Review   Source of Stress Concerns Chronic Illness    Comments Will continue to monitor and offer support as needed             Vocational Rehabilitation: Provide vocational rehab assistance to qualifying candidates.   Vocational Rehab Evaluation & Intervention:  Vocational Rehab  - 12/02/23 1432       Initial Vocational Rehab Evaluation & Intervention   Assessment shows need for Vocational Rehabilitation No   Retired Runner, broadcasting/film/video            Education: Education Goals: Education classes will be provided on a weekly basis, covering required topics. Participant will state understanding/return demonstration of topics presented.    Education     Row Name 12/09/23 1400     Education   Cardiac Education Topics [facility]   Psychologist, forensic Exercise Education   Exercise Education Biomechanial Limitations   Instruction Review Code 1- Verbalizes Understanding   Class Start Time 1400   Class Stop Time 1440   Class Time Calculation (min) 40 min    Row Name 12/11/23 1600     Education   Cardiac Education Topics [facility]   Customer service manager   Weekly Topic Fast Evening Meals   Instruction Review Code 1- Verbalizes Understanding   Class Start Time 1400   Class Stop Time 1440  Class Time Calculation (min) 40 min    Row Name 12/13/23 1400     Education   Cardiac Education Topics [facility]   Select Core Videos     Core Videos   Educator Dietitian   Select Nutrition   Nutrition Vitamins and Minerals   Instruction Review Code 1- Verbalizes Understanding   Class Start Time 1400   Class Stop Time 1440   Class Time Calculation (min) 40 min    Row Name 12/16/23 1500     Education   Cardiac Education Topics Pritikin   Glass blower/designer Nutrition   Nutrition Workshop Fueling a Forensic psychologist   Instruction Review Code 1- Teaching laboratory technician Start Time 1400   Class Stop Time 1440   Class Time Calculation (min) 40 min    Row Name 12/18/23 1400     Education   Cardiac Education Topics Pritikin   Customer service manager   Weekly Topic [employer]  Cuisine- Spotlight on the [employer] Zones   Instruction Review Code 1- Verbalizes Understanding   Class Start Time 1400   Class Stop Time 1440   Class Time Calculation (min) 40 min    Row Name 12/23/23 1700     Education   Cardiac Education Topics Pritikin   [employer]     Workshops   Educator Exercise Physiologist   [employer] Psychosocial   Psychosocial Workshop Healthy Sleep for a Healthy Heart   Instruction Review Code 1- Verbalizes Understanding   Class Start Time 1403   Class Stop Time 1457   Class Time Calculation (min) 54 min    Row Name 12/25/23 1400     Education   Cardiac Education Topics Pritikin   Customer service manager   Weekly Topic Simple Sides and Sauces   Instruction Review Code 1- Verbalizes Understanding   Class Start Time 1355   Class Stop Time 1431   Class Time Calculation (min) 36 min    Row Name 12/27/23 1500     Education   Cardiac Education Topics Pritikin   Nurse, children's Exercise Physiologist   [employer] Psychosocial   Psychosocial How Our Thoughts Can Heal Our Hearts   Instruction Review Code 1- Verbalizes Understanding   Class Start Time 1400   Class Stop Time 1440   Class Time Calculation (min) 40 min    Row Name 12/30/23 1500     Education   Cardiac Education Topics Pritikin   [employer] Workshops     Workshops   Educator Exercise Physiologist   Select Exercise   Exercise Workshop Managing Heart Disease: Your Path to a Healthier Heart   Instruction Review Code 1- Verbalizes Understanding   Class Start Time 1400   Class Stop Time 1448   Class Time Calculation (min) 48 min            Core Videos: Exercise    Move It!  Clinical staff conducted group or individual video education with verbal and written material and guidebook.  Patient learns the recommended Pritikin exercise program. Exercise with the goal of living a long, healthy life. Some of the  health benefits of exercise include controlled diabetes, healthier blood pressure levels, improved cholesterol levels, improved heart and lung capacity, improved sleep, and better body composition. Everyone  should speak with their doctor before starting or changing an exercise routine.  Biomechanical Limitations Clinical staff conducted group or individual video education with verbal and written material and guidebook.  Patient learns how biomechanical limitations can impact exercise and how we can mitigate and possibly overcome limitations to have an impactful and balanced exercise routine.  Body Composition Clinical staff conducted group or individual video education with verbal and written material and guidebook.  Patient learns that body composition (ratio of muscle mass to fat mass) is a key component to assessing overall fitness, rather than body weight alone. Increased fat mass, especially visceral belly fat, can put us  at increased risk for metabolic syndrome, type 2 diabetes, heart disease, and even death. It is recommended to combine diet and exercise (cardiovascular and resistance training) to improve your body composition. Seek guidance from your physician and exercise physiologist before implementing an exercise routine.  Exercise Action Plan Clinical staff conducted group or individual video education with verbal and written material and guidebook.  Patient learns the recommended strategies to achieve and enjoy long-term exercise adherence, including variety, self-motivation, self-efficacy, and positive decision making. Benefits of exercise include fitness, good health, weight management, more energy, better sleep, less stress, and overall well-being.  Medical   Heart Disease Risk Reduction Clinical staff conducted group or individual video education with verbal and written material and guidebook.  Patient learns our heart is our most vital organ as it circulates oxygen, nutrients,  white blood cells, and hormones throughout the entire body, and carries waste away. Data supports a plant-based eating plan like the Pritikin Program for its effectiveness in slowing progression of and reversing heart disease. The video provides a number of recommendations to address heart disease.   Metabolic Syndrome and Belly Fat  Clinical staff conducted group or individual video education with verbal and written material and guidebook.  Patient learns what metabolic syndrome is, how it leads to heart disease, and how one can reverse it and keep it from coming back. You have metabolic syndrome if you have 3 of the following 5 criteria: abdominal obesity, high blood pressure, high triglycerides, low HDL cholesterol, and high blood sugar.  Hypertension and Heart Disease Clinical staff conducted group or individual video education with verbal and written material and guidebook.  Patient learns that high blood pressure, or hypertension, is very common in the [place] . Hypertension is largely due to excessive salt intake, but other important risk factors include being overweight, physical inactivity, drinking too much alcohol, smoking, and not eating enough potassium from fruits and vegetables. High blood pressure is a leading risk factor for heart attack, stroke, congestive heart failure, dementia, kidney failure, and premature death. Long-term effects of excessive salt intake include stiffening of the arteries and thickening of heart muscle and organ damage. Recommendations include ways to reduce hypertension and the risk of heart disease.  Diseases of Our Time - Focusing on Diabetes Clinical staff conducted group or individual video education with verbal and written material and guidebook.  Patient learns why the best way to stop diseases of our time is prevention, through food and other lifestyle changes. Medicine (such as prescription pills and surgeries) is often only a Band-Aid on the problem,  not a long-term solution. Most common diseases of our time include obesity, type 2 diabetes, hypertension, heart disease, and cancer. The Pritikin Program is recommended and has been proven to help reduce, reverse, and/or prevent the damaging effects of metabolic syndrome.  Nutrition   Overview of the  Pritikin Eating Plan  Clinical staff conducted group or individual video education with verbal and written material and guidebook.  Patient learns about the Pritikin Eating Plan for disease risk reduction. The Pritikin Eating Plan emphasizes a wide variety of unrefined, minimally-processed carbohydrates, like fruits, vegetables, whole grains, and legumes. Go, Caution, and Stop food choices are explained. Plant-based and lean animal proteins are emphasized. Rationale provided for low sodium intake for blood pressure control, low added sugars for blood sugar stabilization, and low added fats and oils for coronary artery disease risk reduction and weight management.  Calorie Density  Clinical staff conducted group or individual video education with verbal and written material and guidebook.  Patient learns about calorie density and how it impacts the Pritikin Eating Plan. Knowing the characteristics of the food you choose will help you decide whether those foods will lead to weight gain or weight loss, and whether you want to consume more or less of them. Weight loss is usually a side effect of the Pritikin Eating Plan because of its focus on low calorie-dense foods.  Label Reading  Clinical staff conducted group or individual video education with verbal and written material and guidebook.  Patient learns about the Pritikin recommended label reading guidelines and corresponding recommendations regarding calorie density, added sugars, sodium content, and whole grains.  Dining Out - Part 1  Clinical staff conducted group or individual video education with verbal and written material and guidebook.  Patient  learns that restaurant meals can be sabotaging because they can be so high in calories, fat, sodium, and/or sugar. Patient learns recommended strategies on how to positively address this and avoid unhealthy pitfalls.  Facts on Fats  Clinical staff conducted group or individual video education with verbal and written material and guidebook.  Patient learns that lifestyle modifications can be just as effective, if not more so, as many medications for lowering your risk of heart disease. A Pritikin lifestyle can help to reduce your risk of inflammation and atherosclerosis (cholesterol build-up, or plaque, in the artery walls). Lifestyle interventions such as dietary choices and physical activity address the cause of atherosclerosis. A review of the types of fats and their impact on blood cholesterol levels, along with dietary recommendations to reduce fat intake is also included.  Nutrition Action Plan  Clinical staff conducted group or individual video education with verbal and written material and guidebook.  Patient learns how to incorporate Pritikin recommendations into their lifestyle. Recommendations include planning and keeping personal health goals in mind as an important part of their success.  Healthy Mind-Set    Healthy Minds, Bodies, Hearts  Clinical staff conducted group or individual video education with verbal and written material and guidebook.  Patient learns how to identify when they are stressed. Video will discuss the impact of that stress, as well as the many benefits of stress management. Patient will also be introduced to stress management techniques. The way we think, act, and feel has an impact on our hearts.  How Our Thoughts Can Heal Our Hearts  Clinical staff conducted group or individual video education with verbal and written material and guidebook.  Patient learns that negative thoughts can cause depression and anxiety. This can result in negative lifestyle behavior and  serious health problems. Cognitive behavioral therapy is an effective method to help control our thoughts in order to change and improve our emotional outlook.  Additional Videos:  Exercise    Improving Performance  Clinical staff conducted group or individual video education with  verbal and written material and guidebook.  Patient learns to use a non-linear approach by alternating intensity levels and lengths of time spent exercising to help burn more calories and lose more body fat. Cardiovascular exercise helps improve heart health, metabolism, hormonal balance, blood sugar control, and recovery from fatigue. Resistance training improves strength, endurance, balance, coordination, reaction time, metabolism, and muscle mass. Flexibility exercise improves circulation, posture, and balance. Seek guidance from your physician and exercise physiologist before implementing an exercise routine and learn your capabilities and proper form for all exercise.  Introduction to Yoga  Clinical staff conducted group or individual video education with verbal and written material and guidebook.  Patient learns about yoga, a discipline of the coming together of mind, breath, and body. The benefits of yoga include improved flexibility, improved range of motion, better posture and core strength, increased lung function, weight loss, and positive self-image. Yoga's heart health benefits include lowered blood pressure, healthier heart rate, decreased cholesterol and triglyceride levels, improved immune function, and reduced stress. Seek guidance from your physician and exercise physiologist before implementing an exercise routine and learn your capabilities and proper form for all exercise.  Medical   Aging: Enhancing Your Quality of Life  Clinical staff conducted group or individual video education with verbal and written material and guidebook.  Patient learns key strategies and recommendations to stay in good physical  health and enhance quality of life, such as prevention strategies, having an advocate, securing a Health Care Proxy and Power of Attorney, and keeping a list of medications and system for tracking them. It also discusses how to avoid risk for bone loss.  Biology of Weight Control  Clinical staff conducted group or individual video education with verbal and written material and guidebook.  Patient learns that weight gain occurs because we consume more calories than we burn (eating more, moving less). Even if your body weight is normal, you may have higher ratios of fat compared to muscle mass. Too much body fat puts you at increased risk for cardiovascular disease, heart attack, stroke, type 2 diabetes, and obesity-related cancers. In addition to exercise, following the Pritikin Eating Plan can help reduce your risk.  Decoding Lab Results  Clinical staff conducted group or individual video education with verbal and written material and guidebook.  Patient learns that lab test reflects one measurement whose values change over time and are influenced by many factors, including medication, stress, sleep, exercise, food, hydration, pre-existing medical conditions, and more. It is recommended to use the knowledge from this video to become more involved with your lab results and evaluate your numbers to speak with your doctor.   Diseases of Our Time - Overview  Clinical staff conducted group or individual video education with verbal and written material and guidebook.  Patient learns that according to the CDC, 50% to 70% of chronic diseases (such as obesity, type 2 diabetes, elevated lipids, hypertension, and heart disease) are avoidable through lifestyle improvements including healthier food choices, listening to satiety cues, and increased physical activity.  Sleep Disorders Clinical staff conducted group or individual video education with verbal and written material and guidebook.  Patient learns how  good quality and duration of sleep are important to overall health and well-being. Patient also learns about sleep disorders and how they impact health along with recommendations to address them, including discussing with a physician.  Nutrition  Dining Out - Part 2 Clinical staff conducted group or individual video education with verbal and written material and guidebook.  Patient learns how to plan ahead and communicate in order to maximize their dining experience in a healthy and nutritious manner. Included are recommended food choices based on the type of restaurant the patient is visiting.   Fueling a Banker conducted group or individual video education with verbal and written material and guidebook.  There is a strong connection between our food choices and our health. Diseases like obesity and type 2 diabetes are very prevalent and are in large-part due to lifestyle choices. The Pritikin Eating Plan provides plenty of food and hunger-curbing satisfaction. It is easy to follow, affordable, and helps reduce health risks.  Menu Workshop  Clinical staff conducted group or individual video education with verbal and written material and guidebook.  Patient learns that restaurant meals can sabotage health goals because they are often packed with calories, fat, sodium, and sugar. Recommendations include strategies to plan ahead and to communicate with the manager, chef, or server to help order a healthier meal.  Planning Your Eating Strategy  Clinical staff conducted group or individual video education with verbal and written material and guidebook.  Patient learns about the Pritikin Eating Plan and its benefit of reducing the risk of disease. The Pritikin Eating Plan does not focus on calories. Instead, it emphasizes high-quality, nutrient-rich foods. By knowing the characteristics of the foods, we choose, we can determine their calorie density and make informed  decisions.  Targeting Your Nutrition Priorities  Clinical staff conducted group or individual video education with verbal and written material and guidebook.  Patient learns that lifestyle habits have a tremendous impact on disease risk and progression. This video provides eating and physical activity recommendations based on your personal health goals, such as reducing LDL cholesterol, losing weight, preventing or controlling type 2 diabetes, and reducing high blood pressure.  Vitamins and Minerals  Clinical staff conducted group or individual video education with verbal and written material and guidebook.  Patient learns different ways to obtain key vitamins and minerals, including through a recommended healthy diet. It is important to discuss all supplements you take with your doctor.   Healthy Mind-Set    Smoking Cessation  Clinical staff conducted group or individual video education with verbal and written material and guidebook.  Patient learns that cigarette smoking and tobacco addiction pose a serious health risk which affects millions of people. Stopping smoking will significantly reduce the risk of heart disease, lung disease, and many forms of cancer. Recommended strategies for quitting are covered, including working with your doctor to develop a successful plan.  Culinary   Becoming a Set designer conducted group or individual video education with verbal and written material and guidebook.  Patient learns that cooking at home can be healthy, cost-effective, quick, and puts them in control. Keys to cooking healthy recipes will include looking at your recipe, assessing your equipment needs, planning ahead, making it simple, choosing cost-effective seasonal ingredients, and limiting the use of added fats, salts, and sugars.  Cooking - Breakfast and Snacks  Clinical staff conducted group or individual video education with verbal and written material and guidebook.   Patient learns how important breakfast is to satiety and nutrition through the entire day. Recommendations include key foods to eat during breakfast to help stabilize blood sugar levels and to prevent overeating at meals later in the day. Planning ahead is also a key component.  Cooking - Educational psychologist conducted group or individual video education with verbal  and written material and guidebook.  Patient learns eating strategies to improve overall health, including an approach to cook more at home. Recommendations include thinking of animal protein as a side on your plate rather than center stage and focusing instead on lower calorie dense options like vegetables, fruits, whole grains, and plant-based proteins, such as beans. Making sauces in large quantities to freeze for later and leaving the skin on your vegetables are also recommended to maximize your experience.  Cooking - Healthy Salads and Dressing Clinical staff conducted group or individual video education with verbal and written material and guidebook.  Patient learns that vegetables, fruits, whole grains, and legumes are the foundations of the Pritikin Eating Plan. Recommendations include how to incorporate each of these in flavorful and healthy salads, and how to create homemade salad dressings. Proper handling of ingredients is also covered. Cooking - Soups and [employer] - Soups and Desserts Clinical staff conducted group or individual video education with verbal and written material and guidebook.  Patient learns that Pritikin soups and desserts make for easy, nutritious, and delicious snacks and meal components that are low in sodium, fat, sugar, and calorie density, while high in vitamins, minerals, and filling fiber. Recommendations include simple and healthy ideas for soups and desserts.   Overview     The Pritikin Solution Program Overview Clinical staff conducted group or individual video education with  verbal and written material and guidebook.  Patient learns that the results of the Pritikin Program have been documented in more than 100 articles published in peer-reviewed journals, and the benefits include reducing risk factors for (and, in some cases, even reversing) high cholesterol, high blood pressure, type 2 diabetes, obesity, and more! An overview of the three key pillars of the Pritikin Program will be covered: eating well, doing regular exercise, and having a healthy mind-set.  WORKSHOPS  Exercise: Exercise Basics: Building Your Action Plan Clinical staff led group instruction and group discussion with PowerPoint presentation and patient guidebook. To enhance the learning environment the use of posters, models and videos may be added. At the conclusion of this workshop, patients will comprehend the difference between physical activity and exercise, as well as the benefits of incorporating both, into their routine. Patients will understand the FITT (Frequency, Intensity, Time, and Type) principle and how to use it to build an exercise action plan. In addition, safety concerns and other considerations for exercise and cardiac rehab will be addressed by the presenter. The purpose of this lesson is to promote a comprehensive and effective weekly exercise routine in order to improve patients' overall level of fitness.   Managing Heart Disease: Your Path to a Healthier Heart Clinical staff led group instruction and group discussion with PowerPoint presentation and patient guidebook. To enhance the learning environment the use of posters, models and videos may be added.At the conclusion of this workshop, patients will understand the anatomy and physiology of the heart. Additionally, they will understand how Pritikin's three pillars impact the risk factors, the progression, and the management of heart disease.  The purpose of this lesson is to provide a high-level overview of the heart, heart  disease, and how the Pritikin lifestyle positively impacts risk factors.  Exercise Biomechanics Clinical staff led group instruction and group discussion with PowerPoint presentation and patient guidebook. To enhance the learning environment the use of posters, models and videos may be added. Patients will learn how the structural parts of their bodies function and how these functions impact their daily  activities, movement, and exercise. Patients will learn how to promote a neutral spine, learn how to manage pain, and identify ways to improve their physical movement in order to promote healthy living. The purpose of this lesson is to expose patients to common physical limitations that impact physical activity. Participants will learn practical ways to adapt and manage aches and pains, and to minimize their effect on regular exercise. Patients will learn how to maintain good posture while sitting, walking, and lifting.  Balance Training and Fall Prevention  Clinical staff led group instruction and group discussion with PowerPoint presentation and patient guidebook. To enhance the learning environment the use of posters, models and videos may be added. At the conclusion of this workshop, patients will understand the importance of their sensorimotor skills (vision, proprioception, and the vestibular system) in maintaining their ability to balance as they age. Patients will apply a variety of balancing exercises that are appropriate for their current level of function. Patients will understand the common causes for poor balance, possible solutions to these problems, and ways to modify their physical environment in order to minimize their fall risk. The purpose of this lesson is to teach patients about the importance of maintaining balance as they age and ways to minimize their risk of falling.  WORKSHOPS   Nutrition:  Fueling a Ship broker led group instruction and group  discussion with PowerPoint presentation and patient guidebook. To enhance the learning environment the use of posters, models and videos may be added. Patients will review the foundational principles of the Pritikin Eating Plan and understand what constitutes a serving size in each of the food groups. Patients will also learn Pritikin-friendly foods that are better choices when away from home and review make-ahead meal and snack options. Calorie density will be reviewed and applied to three nutrition priorities: weight maintenance, weight loss, and weight gain. The purpose of this lesson is to reinforce (in a group setting) the key concepts around what patients are recommended to eat and how to apply these guidelines when away from home by planning and selecting [facility]in-friendly options. Patients will understand how calorie density may be adjusted for different weight management goals.  Mindful Eating  Clinical staff led group instruction and group discussion with PowerPoint presentation and patient guidebook. To enhance the learning environment the use of posters, models and videos may be added. Patients will briefly review the concepts of the [facility] Eating Plan and the importance of low-calorie dense foods. The concept of mindful eating will be introduced as well as the importance of paying attention to internal hunger signals. Triggers for non-hunger eating and techniques for dealing with triggers will be explored. The purpose of this lesson is to provide patients with the opportunity to review the basic principles of the [facility] Eating Plan, discuss the value of eating mindfully and how to measure internal cues of hunger and fullness using the Hunger Scale. Patients will also discuss reasons for non-hunger eating and learn strategies to use for controlling emotional eating.  Targeting Your Nutrition Priorities Clinical staff led group instruction and group discussion with PowerPoint presentation and  patient guidebook. To enhance the learning environment the use of posters, models and videos may be added. Patients will learn how to determine their genetic susceptibility to disease by reviewing their family history. Patients will gain insight into the importance of diet as part of an overall healthy lifestyle in mitigating the impact of genetics and other environmental insults. The purpose of this lesson is  to provide patients with the opportunity to assess their personal nutrition priorities by looking at their family history, their own health history and current risk factors. Patients will also be able to discuss ways of prioritizing and modifying the [facility] Eating Plan for their highest risk areas  Menu  Clinical staff led group instruction and group discussion with PowerPoint presentation and patient guidebook. To enhance the learning environment the use of posters, models and videos may be added. Using menus brought in from [employer], or printed from [employer], patients will apply the [facility] dining out guidelines that were presented in the [employer] video. Patients will also be able to practice these guidelines in a variety of provided scenarios. The purpose of this lesson is to provide patients with the opportunity to practice hands-on learning of the [facility] Dining Out guidelines with actual menus and practice scenarios.  Label Reading Clinical staff led group instruction and group discussion with PowerPoint presentation and patient guidebook. To enhance the learning environment the use of posters, models and videos may be added. Patients will review and discuss the [facility] label reading guidelines presented in [facility]'s Label Reading Educational series video. Using fool labels brought in from local grocery stores and markets, patients will apply the label reading guidelines and determine if the packaged food meet the [facility] guidelines. The purpose of this  lesson is to provide patients with the opportunity to review, discuss, and practice hands-on learning of the [facility] Label Reading guidelines with actual packaged food labels. Cooking School  [facility]'s [employer] are designed to teach patients ways to prepare quick, simple, and affordable recipes at home. The importance of nutrition's role in chronic disease risk reduction is reflected in its emphasis in the overall [facility] program. By learning how to prepare essential core [facility] Eating Plan recipes, patients will increase control over what they eat; be able to customize the flavor of foods without the use of added salt, sugar, or fat; and improve the quality of the food they consume. By learning a set of core recipes which are easily assembled, quickly prepared, and affordable, patients are more likely to prepare more healthy foods at home. These workshops focus on convenient breakfasts, simple entres, side dishes, and desserts which can be prepared with minimal effort and are consistent with nutrition recommendations for cardiovascular risk reduction. Cooking [employer] are taught by a Armed forces logistics/support/administrative officer (RD) who has been trained by the [employer]. The chef or RD has a clear understanding of the importance of minimizing - if not completely eliminating - added fat, sugar, and sodium in recipes. Throughout the series of Cooking School Workshop sessions, patients will learn about healthy ingredients and efficient methods of cooking to build confidence in their capability to prepare    Cooking School weekly topics:  Adding Flavor- Sodium-Free  Fast and Healthy Breakfasts  Powerhouse Plant-Based Proteins  Satisfying Salads and Dressings  Simple Sides and Sauces  International Cuisine-Spotlight on the [employer] Zones  Delicious Desserts  Savory Soups  [employer] - Meals in a Astronomer Appetizers and Snacks  Comforting Weekend Breakfasts  One-Pot  Wonders   Fast Evening Meals  Landscape architect Your Pritikin Plate  WORKSHOPS   Healthy Mindset (Psychosocial):  Focused Goals, Sustainable Changes Clinical staff led group instruction and group discussion with PowerPoint presentation and patient guidebook. To enhance the learning environment the use of posters, models and videos may be added. Patients will be able  to apply effective goal setting strategies to establish at least one personal goal, and then take consistent, meaningful action toward that goal. They will learn to identify common barriers to achieving personal goals and develop strategies to overcome them. Patients will also gain an understanding of how our mind-set can impact our ability to achieve goals and the importance of cultivating a positive and growth-oriented mind-set. The purpose of this lesson is to provide patients with a deeper understanding of how to set and achieve personal goals, as well as the tools and strategies needed to overcome common obstacles which may arise along the way.  From Head to Heart: The Power of a Healthy Outlook  Clinical staff led group instruction and group discussion with PowerPoint presentation and patient guidebook. To enhance the learning environment the use of posters, models and videos may be added. Patients will be able to recognize and describe the impact of emotions and mood on physical health. They will discover the importance of self-care and explore self-care practices which may work for them. Patients will also learn how to utilize the 4 C's to cultivate a healthier outlook and better manage stress and challenges. The purpose of this lesson is to demonstrate to patients how a healthy outlook is an essential part of maintaining good health, especially as they continue their cardiac rehab journey.  Healthy Sleep for a Healthy Heart Clinical staff led group instruction and group discussion with PowerPoint presentation and  patient guidebook. To enhance the learning environment the use of posters, models and videos may be added. At the conclusion of this workshop, patients will be able to demonstrate knowledge of the importance of sleep to overall health, well-being, and quality of life. They will understand the symptoms of, and treatments for, common sleep disorders. Patients will also be able to identify daytime and nighttime behaviors which impact sleep, and they will be able to apply these tools to help manage sleep-related challenges. The purpose of this lesson is to provide patients with a general overview of sleep and outline the importance of quality sleep. Patients will learn about a few of the most common sleep disorders. Patients will also be introduced to the concept of "sleep hygiene," and discover ways to self-manage certain sleeping problems through simple daily behavior changes. Finally, the workshop will motivate patients by clarifying the links between quality sleep and their goals of heart-healthy living.   Recognizing and Reducing Stress Clinical staff led group instruction and group discussion with PowerPoint presentation and patient guidebook. To enhance the learning environment the use of posters, models and videos may be added. At the conclusion of this workshop, patients will be able to understand the types of stress reactions, differentiate between acute and chronic stress, and recognize the impact that chronic stress has on their health. They will also be able to apply different coping mechanisms, such as reframing negative self-talk. Patients will have the opportunity to practice a variety of stress management techniques, such as deep abdominal breathing, progressive muscle relaxation, and/or guided imagery.  The purpose of this lesson is to educate patients on the role of stress in their lives and to provide healthy techniques for coping with it.  Learning Barriers/Preferences:  Learning  Barriers/Preferences - 12/02/23 1432       Learning Barriers/Preferences   Learning Barriers Sight;Hearing   glasses/hearing aids   Learning Preferences Audio;Computer/Internet;Group Instruction;Individual Instruction;Pictoral;Skilled Demonstration;Verbal Instruction;Video;Written Material             Education Topics:  Knowledge Questionnaire Score:  Knowledge Questionnaire Score - 12/02/23 1432       Knowledge Questionnaire Score   Pre Score 22/24             Core Components/Risk Factors/Patient Goals at Admission:  Personal Goals and Risk Factors at Admission - 12/02/23 1432       Core Components/Risk Factors/Patient Goals on Admission    Weight Management Yes    Intervention Weight Management: Develop a combined nutrition and exercise program designed to reach desired caloric intake, while maintaining appropriate intake of nutrient and fiber, sodium and fats, and appropriate energy expenditure required for the weight goal.;Weight Management: Provide education and appropriate resources to help participant work on and attain dietary goals.    Expected Outcomes Short Term: Continue to assess and modify interventions until short term weight is achieved;Long Term: Adherence to nutrition and physical activity/exercise program aimed toward attainment of established weight goal;Understanding recommendations for meals to include 15-35% energy as protein, 25-35% energy from fat, 35-60% energy from carbohydrates, less than 200mg  of dietary cholesterol, 20-35 gm of total fiber daily;Understanding of distribution of calorie intake throughout the day with the consumption of 4-5 meals/snacks    Hypertension Yes    Intervention Provide education on lifestyle modifcations including regular physical activity/exercise, weight management, moderate sodium restriction and increased consumption of fresh fruit, vegetables, and low fat dairy, alcohol moderation, and smoking cessation.;Monitor  prescription use compliance.    Expected Outcomes Short Term: Continued assessment and intervention until BP is < 140/74mm HG in hypertensive participants. < 130/48mm HG in hypertensive participants with diabetes, heart failure or chronic kidney disease.;Long Term: Maintenance of blood pressure at goal levels.    Lipids Yes    Intervention Provide education and support for participant on nutrition & aerobic/resistive exercise along with prescribed medications to achieve LDL 70mg , HDL >40mg .    Expected Outcomes Short Term: Participant states understanding of desired cholesterol values and is compliant with medications prescribed. Participant is following exercise prescription and nutrition guidelines.;Long Term: Cholesterol controlled with medications as prescribed, with individualized exercise RX and with personalized nutrition plan. Value goals: LDL < 70mg , HDL > 40 mg.    Stress Yes    Intervention Offer individual and/or small group education and counseling on adjustment to heart disease, stress management and health-related lifestyle change. Teach and support self-help strategies.;Refer participants experiencing significant psychosocial distress to appropriate mental health specialists for further evaluation and treatment. When possible, include family members and significant others in education/counseling sessions.    Expected Outcomes Short Term: Participant demonstrates changes in health-related behavior, relaxation and other stress management skills, ability to obtain effective social support, and compliance with psychotropic medications if prescribed.;Long Term: Emotional wellbeing is indicated by absence of clinically significant psychosocial distress or social isolation.             Core Components/Risk Factors/Patient Goals Review:   Goals and Risk Factor Review     Row Name 01/01/24 [phone]             Core Components/Risk Factors/Patient Goals Review   Personal Goals Review  Weight Management/Obesity;Hypertension;Lipids;Stress       Review Cimberly has been doing well with exercise at cardiac rehab. Vital signs have been stable. Donn has gained 3 kg since starting the program       Expected Outcomes Aira will continue to participate in cardiac rehab for exercise nutrtion and lifestyle modifications                Core Components/Risk Factors/Patient  Goals at Discharge (Final Review):   Goals and Risk Factor Review - 01/01/24 0842       Core Components/Risk Factors/Patient Goals Review   Personal Goals Review Weight Management/Obesity;Hypertension;Lipids;Stress    Review Jaidon has been doing well with exercise at cardiac rehab. Vital signs have been stable. Viviann has gained 3 kg since starting the program    Expected Outcomes Autumm will continue to participate in cardiac rehab for exercise nutrtion and lifestyle modifications             ITP Comments:  ITP Comments     Row Name 12/02/23 1315 12/09/23 1515 01/01/24 0834       ITP Comments Dr. Gaylyn Keas medical director. Introduction to [facility]itikin education/intensive cardiac rehab. Initial orientation packet reviewed with patient. 30 Day ITP Review. Artha started cardiac rehab on 12/09/23. Celita did well with exercise. 30 Day ITP Review. Aaleyah has good attendance and participation with exercise at cardiac rehab              Comments: See ITP comments.Monte Antonio RN BSN

## 2024-01-03 ENCOUNTER — Encounter (HOSPITAL_COMMUNITY): Admission: RE | Admit: 2024-01-03 | Source: Ambulatory Visit

## 2024-01-03 DIAGNOSIS — I2510 Atherosclerotic heart disease of native coronary artery without angina pectoris: Secondary | ICD-10-CM | POA: Diagnosis not present

## 2024-01-03 DIAGNOSIS — I10 Essential (primary) hypertension: Secondary | ICD-10-CM | POA: Diagnosis not present

## 2024-01-03 DIAGNOSIS — R7989 Other specified abnormal findings of blood chemistry: Secondary | ICD-10-CM | POA: Diagnosis not present

## 2024-01-03 DIAGNOSIS — M810 Age-related osteoporosis without current pathological fracture: Secondary | ICD-10-CM | POA: Diagnosis not present

## 2024-01-06 ENCOUNTER — Encounter (HOSPITAL_COMMUNITY)
Admission: RE | Admit: 2024-01-06 | Discharge: 2024-01-06 | Disposition: A | Discharge status: Home / Self Care | Source: Ambulatory Visit | Attending: Cardiology | Admitting: Cardiology

## 2024-01-06 DIAGNOSIS — Z952 Presence of prosthetic heart valve: Secondary | ICD-10-CM | POA: Diagnosis not present

## 2024-01-08 ENCOUNTER — Encounter (HOSPITAL_COMMUNITY)
Admission: RE | Admit: 2024-01-08 | Discharge: 2024-01-08 | Disposition: A | Discharge status: Home / Self Care | Source: Ambulatory Visit | Attending: Cardiology

## 2024-01-08 DIAGNOSIS — F331 Major depressive disorder, recurrent, moderate: Secondary | ICD-10-CM | POA: Diagnosis not present

## 2024-01-08 DIAGNOSIS — Z952 Presence of prosthetic heart valve: Secondary | ICD-10-CM | POA: Diagnosis not present

## 2024-01-09 DIAGNOSIS — K8689 Other specified diseases of pancreas: Secondary | ICD-10-CM | POA: Diagnosis not present

## 2024-01-09 DIAGNOSIS — I7121 Aneurysm of the ascending aorta, without rupture: Secondary | ICD-10-CM | POA: Diagnosis not present

## 2024-01-09 DIAGNOSIS — F331 Major depressive disorder, recurrent, moderate: Secondary | ICD-10-CM | POA: Diagnosis not present

## 2024-01-09 DIAGNOSIS — I2510 Atherosclerotic heart disease of native coronary artery without angina pectoris: Secondary | ICD-10-CM | POA: Diagnosis not present

## 2024-01-09 DIAGNOSIS — M810 Age-related osteoporosis without current pathological fracture: Secondary | ICD-10-CM | POA: Diagnosis not present

## 2024-01-09 DIAGNOSIS — Z1331 Encounter for screening for depression: Secondary | ICD-10-CM | POA: Diagnosis not present

## 2024-01-09 DIAGNOSIS — F418 Other specified anxiety disorders: Secondary | ICD-10-CM | POA: Diagnosis not present

## 2024-01-09 DIAGNOSIS — Z0000 Encounter for general adult medical examination without abnormal findings: Secondary | ICD-10-CM | POA: Diagnosis not present

## 2024-01-09 DIAGNOSIS — I119 Hypertensive heart disease without heart failure: Secondary | ICD-10-CM | POA: Diagnosis not present

## 2024-01-09 DIAGNOSIS — I471 Supraventricular tachycardia, unspecified: Secondary | ICD-10-CM | POA: Diagnosis not present

## 2024-01-09 DIAGNOSIS — Z1339 Encounter for screening examination for other mental health and behavioral disorders: Secondary | ICD-10-CM | POA: Diagnosis not present

## 2024-01-10 ENCOUNTER — Telehealth (HOSPITAL_COMMUNITY): Payer: Self-pay

## 2024-01-10 ENCOUNTER — Encounter (HOSPITAL_COMMUNITY)
Admission: RE | Admit: 2024-01-10 | Discharge: 2024-01-10 | Disposition: A | Discharge status: Home / Self Care | Source: Ambulatory Visit | Attending: Cardiology | Admitting: Cardiology

## 2024-01-10 DIAGNOSIS — Z952 Presence of prosthetic heart valve: Secondary | ICD-10-CM | POA: Insufficient documentation

## 2024-01-10 NOTE — Telephone Encounter (Signed)
 CARDIAC REHAB PHASE 2  Erika Wu let me know she would not be able to attend [facility] next week for travel, appt are canceled in epic   Darl Edu ACSM-CEP 01/10/2024 5:28 PM

## 2024-01-13 ENCOUNTER — Encounter (HOSPITAL_COMMUNITY)

## 2024-01-13 ENCOUNTER — Encounter (HOSPITAL_COMMUNITY)
Admission: RE | Admit: 2024-01-13 | Discharge: 2024-01-13 | Disposition: A | Discharge status: Home / Self Care | Source: Ambulatory Visit | Attending: Cardiology | Admitting: Cardiology

## 2024-01-13 DIAGNOSIS — Z952 Presence of prosthetic heart valve: Secondary | ICD-10-CM | POA: Diagnosis not present

## 2024-01-15 ENCOUNTER — Telehealth: Payer: Self-pay | Admitting: Gastroenterology

## 2024-01-15 ENCOUNTER — Encounter (HOSPITAL_COMMUNITY)

## 2024-01-15 ENCOUNTER — Encounter (HOSPITAL_COMMUNITY)
Admission: RE | Admit: 2024-01-15 | Discharge: 2024-01-15 | Disposition: A | Discharge status: Home / Self Care | Source: Ambulatory Visit | Attending: Cardiology | Admitting: Cardiology

## 2024-01-15 DIAGNOSIS — Z952 Presence of prosthetic heart valve: Secondary | ICD-10-CM | POA: Diagnosis not present

## 2024-01-15 NOTE — Telephone Encounter (Signed)
 Patient requesting a call to discuss rescheduling 09/16/23 endoscopy. Please advise, thank you.

## 2024-01-16 NOTE — Telephone Encounter (Signed)
 EUS has been set up for 03/16/24 11 am at [facility] with GM   EUS scheduled, pt instructed and medications reviewed.  Patient instructions mailed to home.  Patient to call with any questions or concerns.

## 2024-01-17 ENCOUNTER — Encounter (HOSPITAL_COMMUNITY)

## 2024-01-20 ENCOUNTER — Encounter (HOSPITAL_COMMUNITY): Admission: RE | Admit: 2024-01-20 | Source: Ambulatory Visit

## 2024-01-22 ENCOUNTER — Encounter (HOSPITAL_COMMUNITY)

## 2024-01-24 ENCOUNTER — Encounter (HOSPITAL_COMMUNITY): Admission: RE | Admit: 2024-01-24 | Source: Ambulatory Visit

## 2024-01-27 ENCOUNTER — Encounter (HOSPITAL_COMMUNITY)
Admission: RE | Admit: 2024-01-27 | Discharge: 2024-01-27 | Disposition: A | Discharge status: Home / Self Care | Source: Ambulatory Visit | Attending: Cardiology | Admitting: Cardiology

## 2024-01-27 DIAGNOSIS — Z952 Presence of prosthetic heart valve: Secondary | ICD-10-CM

## 2024-01-28 NOTE — Telephone Encounter (Signed)
 Left message on machine to call back

## 2024-01-28 NOTE — Telephone Encounter (Signed)
 Inbound call from patient requesting to reschedule 7/7 procedure due to needing to be seen with Cardiologist first. Patient requesting a call back. Please advise, thank you.

## 2024-01-29 ENCOUNTER — Encounter (HOSPITAL_COMMUNITY)
Admission: RE | Admit: 2024-01-29 | Discharge: 2024-01-29 | Disposition: A | Discharge status: Home / Self Care | Source: Ambulatory Visit | Attending: Cardiology | Admitting: Cardiology

## 2024-01-29 DIAGNOSIS — Z952 Presence of prosthetic heart valve: Secondary | ICD-10-CM

## 2024-01-29 NOTE — Telephone Encounter (Signed)
 I spoke with the pt and she would like to see Dr Brice Campi before her EUS. I have set her up to 8/1 to see Dr Brice Campi and the EUS moved to 8/5 at 915.  All information given to the pt

## 2024-01-29 NOTE — Telephone Encounter (Signed)
 Left message on machine to call back

## 2024-01-29 NOTE — Progress Notes (Signed)
 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 (Patient taking differently: Take 325 mg by mouth daily. Patient reports only taking 81mg ), Disp: 100 tablet, Rfl: 3   Bacillus Coagulans-Inulin (PROBIOTIC-PREBIOTIC PO), Take 1 tablet by mouth daily., Disp: , Rfl:    buPROPion  (WELLBUTRIN  XL) 150 MG 24 hr tablet, Take 300 mg by mouth every morning., Disp: , Rfl: 3   carvedilol  (COREG ) 12.5 MG tablet, Take 1 tablet (12.5 mg total) by mouth 2 (two) times daily with a meal., Disp: 60 tablet, Rfl: 2   mirtazapine  (REMERON ) 45 MG tablet, Take 45 mg by mouth at bedtime., Disp: , Rfl:    pantoprazole  (PROTONIX ) 40 MG tablet, TAKE 1 TABLET(40 MG) BY MOUTH TWICE DAILY BEFORE A MEAL, Disp: 180 tablet, Rfl: 3   rosuvastatin  (CRESTOR ) 10 MG tablet, Take 10 mg by mouth daily., Disp: , Rfl:    Suvorexant  (BELSOMRA ) 20 MG TABS, Take 20 mg by mouth at bedtime., Disp: , Rfl:    temazepam (RESTORIL) 15 MG capsule, Take 15 mg by mouth at bedtime as needed for sleep., Disp: , Rfl:   Past Medical History: Past Medical History:  Diagnosis Date   Allergy     Anxiety    Arthritis    Chest pain    a. 03/2015 Lexiscan  MV: EF 59%, no ischemia/infarct.   Colon polyps    COVID-19 virus infection    09-22-2020, 10/2022 fully vax'd   Depression    Family history of adverse reaction to anesthesia    daughter has PONV   GERD (gastroesophageal reflux disease)    Heart murmur    Hemorrhoids    Hyperlipidemia    Hypertension    Moderate aortic insufficiency    a. 03/2015 Echo: EF 55-60%, no rwma, mild LVH, Gr 1 DD, mild AS, mod AI (bicuspid AoV by report, poorly visualized on this study), Asc Ao diam 44mm, triv MR.    Tobacco Use: Social History   Tobacco Use  Smoking Status Former   Current packs/day: 0.50   Average packs/day: 0.5 packs/day for 15.0 years (7.5 ttl pk-yrs)   Types: Cigarettes  Smokeless Tobacco Never    Labs: Review Flowsheet       Latest Ref Rng & Units 07/19/2016 10/03/2023 10/28/2023 10/30/2023  Labs for ITP Cardiac and Pulmonary Rehab  Cholestrol <200 mg/dL 621  - - -  LDL (calc) mg/dL 89  - - -  HDL-C >30 mg/dL 86  - - -  Trlycerides <150 mg/dL 77  - - -  Hemoglobin Q6V 4.8 - 5.6 % - - 5.2  -  PH,  Arterial 7.35 - 7.45 - 7.367  - 7.332  7.260  7.322  7.397  7.323  7.316   PCO2 arterial 32 - 48 mmHg - 35.8  - 37.4  43.0  41.2  36.4  39.6  37.0   Bicarbonate 20.0 - 28.0 mmol/L - 21.6  21.6  20.5  - 19.9  19.5  21.3  22.4  21.3  20.5  18.9   TCO2 22 - 32 mmol/L - 23  23  22   - 21  21  23  24  26  23  26  23  22  21  20  22    Acid-base deficit 0.0 - 2.0 mmol/L - 4.0  4.0  4.0  - 6.0  7.0  4.0  2.0  4.0  5.0  7.0   O2 Saturation % - 57  63  92  - 95  93  100  100  70  100  100     Details       Multiple values from one day are sorted in reverse-chronological order         Capillary Blood Glucose: Lab Results  Component Value Date   GLUCAP 147 (H) 11/03/2023   GLUCAP 88 11/03/2023   GLUCAP 131 (H) 11/02/2023   GLUCAP 106 (H) 11/02/2023   GLUCAP 123 (H) 11/02/2023     Exercise Target Goals: Exercise Program  Goal: Individual exercise prescription set using results from initial 6 min walk test and THRR while considering  patient's activity barriers and safety.   Exercise Prescription Goal: Initial exercise prescription builds to 30-45 minutes a day of aerobic activity, 2-3 days per week.  Home exercise guidelines will be given to patient during program as part of exercise prescription that the participant will acknowledge.  Activity Barriers & Risk Stratification:  Activity Barriers & Cardiac Risk Stratification - 12/02/23 1432       Activity Barriers & Cardiac Risk Stratification   Activity Barriers Neck/Spine Problems;Balance Concerns;Deconditioning;Other (comment)    Comments sternal precautions    Cardiac Risk Stratification High   <5 METs on            6 Minute Walk:  6 Minute Walk     Row Name 12/02/23 1541         6 Minute Walk   Phase Initial     Distance 1200 feet     Walk Time 6 minutes     # of Rest Breaks 0     MPH 2.27     METS 2.5     RPE 13     Perceived Dyspnea  0     VO2 Peak 8.76     Symptoms No     Resting HR 82 bpm     Resting BP 106/70     Resting Oxygen Saturation  99 %     Exercise Oxygen Saturation  during 6 min walk 99 %     Max Ex. HR 93 bpm     Max Ex. BP 122/70     2 Minute Post BP 108/68              Oxygen Initial Assessment:   Oxygen Re-Evaluation:   Oxygen Discharge (Final Oxygen Re-Evaluation):   Initial Exercise Prescription:  Initial Exercise Prescription - 12/02/23 1500       Date of Initial Exercise RX and Referring Provider   Date 12/02/23    Referring Provider Peter [place], MD    Expected Discharge Date  02/26/24      NuStep   Level 1    SPM 70    Minutes 15    METs 2      Track   Laps 15    Minutes 15    METs 2      Prescription Details   Frequency (times per week) 3    Duration Progress to 30 minutes of continuous aerobic without signs/symptoms of physical distress      Intensity   THRR  40-80% of Max Heartrate 56-115    Ratings of Perceived Exertion 11-13    Perceived Dyspnea 0-4      Progression   Progression Continue progressive overload as per policy without signs/symptoms or physical distress.      Resistance Training   Training Prescription Yes    Weight 2    Reps 10-15             Perform Capillary Blood Glucose checks as needed.  Exercise Prescription Changes:   Exercise Prescription Changes     Row Name 12/09/23 1623 12/25/23 1628 01/06/24 1655         Response to Exercise   Blood Pressure (Admit) 102/60 108/68 110/60     Blood Pressure (Exercise) 110/68 102/58 --     Blood Pressure (Exit) 102/60 108/70 102/60     Heart Rate (Admit) 85 bpm 84 bpm 73 bpm     Heart Rate (Exercise) 90 bpm 108 bpm 118 bpm     Heart Rate (Exit) 85 bpm 85 bpm 79 bpm     Rating of Perceived Exertion (Exercise) 11 12.5 13     Perceived Dyspnea (Exercise) 0 0 0     Symptoms 0 0 0     Comments Pt first day in the [employer] program Reviewed MET's, goals and home ExRx Reviewed MET's     Duration Progress to 30 minutes of  aerobic without signs/symptoms of physical distress Progress to 30 minutes of  aerobic without signs/symptoms of physical distress Progress to 30 minutes of  aerobic without signs/symptoms of physical distress     Intensity THRR unchanged THRR unchanged THRR unchanged       Progression   Progression Continue to progress workloads to maintain intensity without signs/symptoms of physical distress. Continue to progress workloads to maintain intensity without signs/symptoms of physical distress. Continue to progress workloads to maintain intensity without signs/symptoms of physical distress.     Average METs 2.02  EST. Pt did not report laps or MET's 2.02 2.84       Resistance Training   Training Prescription Yes Yes Yes     Weight 2 2 2      Reps 10-15 10-15 10-15     Time 10 Minutes 10 Minutes 10 Minutes       NuStep   Level 1 1 2      SPM -- 99 118      Minutes 15 15 15      METs --  Did not report MET's 2.1 2.5       Track   Laps 8 16 17      Minutes 15 15 15      METs --  Did not report laps. Lost count 3.03 3.17       Home Exercise Plan   Plans to continue exercise at -- Home (comment) Home (comment)     Frequency -- Add 2 additional days to program exercise sessions. Add 2 additional days to program exercise sessions.  Initial Home Exercises Provided -- 12/25/23 12/25/23              Exercise Comments:   Exercise Comments     Row Name 12/09/23 1629 12/25/23 1635 01/06/24 1656 01/24/24 1711     Exercise Comments Pt tolerated exercise well with and average MET level of 2.02. This is an estimate becasue MET's were not reported. Pt is off to a good start and is learning her THRR, RPE and ExRx. Reviewed MET's, goals and home ExRx. Pt tolerated exercise well with and average MET level of 2.57. She is doing well and progressing MET's and laps on the track. She is very happt with her progress and is gaining stamina and the ability to walk more. She will continue to exercise on her own by walking, doing aerobic workout videos, stretches and using an exercise ball for 15-30 mins 1-2 days Reviewed MET's. Pt tolerated exercise well with and average MET level of 2.57. Pt is doing well and progressing MET's. Patient has been absent, will review education when she returns, may be visiting family at the beach             Exercise Goals and Review:   Exercise Goals     Row Name 12/02/23 1316             Exercise Goals   Increase Physical Activity Yes       Intervention Provide advice, education, support and counseling about physical activity/exercise needs.;Develop an individualized exercise prescription for aerobic and resistive training based on initial evaluation findings, risk stratification, comorbidities and participant's personal goals.       Expected Outcomes Short Term: Attend rehab on a regular basis to increase  amount of physical activity.;Long Term: Exercising regularly at least 3-5 days a week.;Long Term: Add in home exercise to make exercise part of routine and to increase amount of physical activity.       Increase Strength and Stamina Yes       Intervention Provide advice, education, support and counseling about physical activity/exercise needs.;Develop an individualized exercise prescription for aerobic and resistive training based on initial evaluation findings, risk stratification, comorbidities and participant's personal goals.       Expected Outcomes Short Term: Perform resistance training exercises routinely during rehab and add in resistance training at home;Short Term: Increase workloads from initial exercise prescription for resistance, speed, and METs.;Long Term: Improve cardiorespiratory fitness, muscular endurance and strength as measured by increased METs and functional capacity ( )       Able to understand and use rate of perceived exertion (RPE) scale Yes       Intervention Provide education and explanation on how to use RPE scale       Expected Outcomes Short Term: Able to use RPE daily in rehab to express subjective intensity level;Long Term:  Able to use RPE to guide intensity level when exercising independently       Knowledge and understanding of Target Heart Rate Range (THRR) Yes       Intervention Provide education and explanation of THRR including how the numbers were predicted and where they are located for reference       Expected Outcomes Short Term: Able to state/look up THRR;Short Term: Able to use daily as guideline for intensity in rehab;Long Term: Able to use THRR to govern intensity when exercising independently       Understanding of Exercise Prescription Yes       Intervention Provide education, explanation, and written materials  on patient's individual exercise prescription       Expected Outcomes Short Term: Able to explain program exercise prescription;Long Term:  Able to explain home exercise prescription to exercise independently                Exercise Goals Re-Evaluation :  Exercise Goals Re-Evaluation     Row Name 12/09/23 1627 12/25/23 1632           Exercise Goal Re-Evaluation   Exercise Goals Review Increase Physical Activity;Understanding of Exercise Prescription;Increase Strength and Stamina;Knowledge and understanding of Target Heart Rate Range (THRR);Able to understand and use rate of perceived exertion (RPE) scale Increase Physical Activity;Understanding of Exercise Prescription;Increase Strength and Stamina;Knowledge and understanding of Target Heart Rate Range (THRR);Able to understand and use rate of perceived exertion (RPE) scale      Comments Pt tolerated exercise well with and average MET level of 2.02. This is an estimate becasue MET's were not reported. Pt is off to a good start and is learning her THRR, RPE and ExRx Reviewed MET's, goals and home ExRx. Pt tolerated exercise well with and average MET level of 2.57. She is doing well and progressing MET's and laps on the track. She is very happt with her progress and is gaining stamina and the ability to walk more. She will continue to exercise on her own by walking, doing aerobic workout videos, stretches and using an exercise ball for 15-30 mins 1-2 days      Expected Outcomes Will continue to monitor pt and progress workloads as tolerated without sign or symptom Will continue to monitor pt and progress workloads as tolerated without sign or symptom               Discharge Exercise Prescription (Final Exercise Prescription Changes):  Exercise Prescription Changes - 01/06/24 1655       Response to Exercise   Blood Pressure (Admit) 110/60    Blood Pressure (Exit) 102/60    Heart Rate (Admit) 73 bpm    Heart Rate (Exercise) 118 bpm    Heart Rate (Exit) 79 bpm    Rating of Perceived Exertion (Exercise) 13    Perceived Dyspnea (Exercise) 0    Symptoms 0    Comments  Reviewed MET's    Duration Progress to 30 minutes of  aerobic without signs/symptoms of physical distress    Intensity THRR unchanged      Progression   Progression Continue to progress workloads to maintain intensity without signs/symptoms of physical distress.    Average METs 2.84      Resistance Training   Training Prescription Yes    Weight 2    Reps 10-15    Time 10 Minutes      NuStep   Level 2    SPM 118    Minutes 15    METs 2.5      Track   Laps 17    Minutes 15    METs 3.17      Home Exercise Plan   Plans to continue exercise at Home (comment)    Frequency Add 2 additional days to program exercise sessions.    Initial Home Exercises Provided 12/25/23             Nutrition:  Target Goals: Understanding of nutrition guidelines, daily intake of sodium 1500mg , cholesterol 200mg , calories 30% from fat and 7% or less from saturated fats, daily to have 5 or more servings of fruits and vegetables.  Biometrics:  Pre Biometrics -  12/02/23 1315       Pre Biometrics   Waist Circumference 33 inches    Hip Circumference 34 inches    Waist to Hip Ratio 0.97 %    Triceps Skinfold 10 mm    % Body Fat 30.5 %    Grip Strength 16 kg    Flexibility 15 in    Single Leg Stand 17.4 seconds              Nutrition Therapy Plan and Nutrition Goals:  Nutrition Therapy & Goals - 01/10/24 1442       Nutrition Therapy   Diet Heart Healthy Diet    Drug/Food Interactions Statins/Certain Fruits      Personal Nutrition Goals   Nutrition Goal Patient to identify strategies for reducing cardiovascular risk by attending the [employer] education and nutrition series weekly.   goal in progress.   Personal Goal #2 Patient to improve diet quality by using the plate method as a guide for meal planning to include lean protein/plant protein, fruits, vegetables, whole grains, nonfat dairy as part of a well-balanced diet.   goal in progress.   Comments Goals in progress. Juletta has  medical history of AV replacement, HTN, CAD, hyperlipidemia. LDL is well controlled. She reports no nutrition concerns at this time. She continues regular follow-up with GI. She is up 4.8# since starting with our program; BMI remains appropriate for age. Patient will benefit from participation in intensive cardiac rehab for nutrition, exercise, and lifestyle modification.      Intervention Plan   Intervention Prescribe, educate and counsel regarding individualized specific dietary modifications aiming towards targeted core components such as weight, hypertension, lipid management, diabetes, heart failure and other comorbidities.;Nutrition handout(s) given to patient.    Expected Outcomes Short Term Goal: Understand basic principles of dietary content, such as calories, fat, sodium, cholesterol and nutrients.;Long Term Goal: Adherence to prescribed nutrition plan.             Nutrition Assessments:  MEDIFICTS Score Key: >=70 Need to make dietary changes  40-70 Heart Healthy Diet <= 40 Therapeutic Level Cholesterol Diet    Picture Your Plate Scores: <54 Unhealthy dietary pattern with much room for improvement. 41-50 Dietary pattern unlikely to meet recommendations for good health and room for improvement. 51-60 More healthful dietary pattern, with some room for improvement.  >60 Healthy dietary pattern, although there may be some specific behaviors that could be improved.    Nutrition Goals Re-Evaluation:  Nutrition Goals Re-Evaluation     Row Name 12/09/23 1541 01/10/24 1442           Goals   Current Weight 120 lb 13 oz (54.8 kg) 125 lb 10.6 oz (57 kg)      Comment A1c WNL, LDL 53 A1c WNL, LDL 53      Expected Outcome Jerene has medical history of AV replacement, HTN, CAD, hyperlipidemia. LDL is well controlled. She reports no nutrition concerns at this time. She continues regular follow-up with GI. Patient will benefit from participation in intensive cardiac rehab for nutrition,  exercise, and lifestyle modification. Goals in progress. Rosabel has medical history of AV replacement, HTN, CAD, hyperlipidemia. LDL is well controlled. She reports no nutrition concerns at this time. She continues regular follow-up with GI. She is up 4.8# since starting with our program; BMI remains appropriate for age. Patient will benefit from participation in intensive cardiac rehab for nutrition, exercise, and lifestyle modification.  Nutrition Goals Re-Evaluation:  Nutrition Goals Re-Evaluation     Row Name 12/09/23 1541 01/10/24 1442           Goals   Current Weight 120 lb 13 oz (54.8 kg) 125 lb 10.6 oz (57 kg)      Comment A1c WNL, LDL 53 A1c WNL, LDL 53      Expected Outcome Amarie has medical history of AV replacement, HTN, CAD, hyperlipidemia. LDL is well controlled. She reports no nutrition concerns at this time. She continues regular follow-up with GI. Patient will benefit from participation in intensive cardiac rehab for nutrition, exercise, and lifestyle modification. Goals in progress. Valyncia has medical history of AV replacement, HTN, CAD, hyperlipidemia. LDL is well controlled. She reports no nutrition concerns at this time. She continues regular follow-up with GI. She is up 4.8# since starting with our program; BMI remains appropriate for age. Patient will benefit from participation in intensive cardiac rehab for nutrition, exercise, and lifestyle modification.               Nutrition Goals Discharge (Final Nutrition Goals Re-Evaluation):  Nutrition Goals Re-Evaluation - 01/10/24 1442       Goals   Current Weight 125 lb 10.6 oz (57 kg)    Comment A1c WNL, LDL 53    Expected Outcome Goals in progress. Rea has medical history of AV replacement, HTN, CAD, hyperlipidemia. LDL is well controlled. She reports no nutrition concerns at this time. She continues regular follow-up with GI. She is up 4.8# since starting with our program; BMI remains appropriate  for age. Patient will benefit from participation in intensive cardiac rehab for nutrition, exercise, and lifestyle modification.             Psychosocial: Target Goals: Acknowledge presence or absence of significant depression and/or stress, maximize coping skills, provide positive support system. Participant is able to verbalize types and ability to use techniques and skills needed for reducing stress and depression.  Initial Review & Psychosocial Screening:  Initial Psych Review & Screening - 12/02/23 1425       Initial Review   Current issues with Current Depression;Current Anxiety/Panic;Current Sleep Concerns;Current Stress Concerns    Source of Stress Concerns Chronic Illness    Comments Dailah shared that she has had some feelings of anxiety, depression, and stress since her surgery. She is taking medication which she says is helping, but she has been feeling down since her surgery. She has had trouble sleeping, stating that she has had nightmares and night sweating that sometimes requires her to change clothes 2-3x a night. She feels anxious at times because she lives alone is is worried something may happen. Support offered, Elisama denies any need for additional resources at this time.      Family Dynamics   Good Support System? Yes   Friends in town     Barriers   Psychosocial barriers to participate in program The patient should benefit from training in stress management and relaxation.      Screening Interventions   Interventions Encouraged to exercise;Provide feedback about the scores to participant;To provide support and resources with identified psychosocial needs    Expected Outcomes Long Term goal: The participant improves quality of Life and PHQ9 Scores as seen by post scores and/or verbalization of changes;Short Term goal: Identification and review with participant of any Quality of Life or Depression concerns found by scoring the questionnaire.;Long Term Goal: Stressors  or current issues are controlled or eliminated.;Short Term goal: Utilizing psychosocial  counselor, staff and physician to assist with identification of specific Stressors or current issues interfering with healing process. Setting desired goal for each stressor or current issue identified.             Quality of Life Scores:  Quality of Life - 12/02/23 1542       Quality of Life   Select Quality of Life      Quality of Life Scores   Health/Function Pre 22 %    Socioeconomic Pre 27.86 %    Psych/Spiritual Pre 20.86 %    Family Pre 26.4 %    GLOBAL Pre 23.62 %            Scores of 19 and below usually indicate a poorer quality of life in these areas.  A difference of  2-3 points is a clinically meaningful difference.  A difference of 2-3 points in the total score of the Quality of Life Index has been associated with significant improvement in overall quality of life, self-image, physical symptoms, and general health in studies assessing change in quality of life.  PHQ-9: Review Flowsheet       12/02/2023  Depression screen PHQ 2/9  Decreased Interest 2  Down, Depressed, Hopeless 1  PHQ - 2 Score 3  Altered sleeping 3  Tired, decreased energy 2  Change in appetite 2  Feeling bad or failure about yourself  1  Trouble concentrating 1  Moving slowly or fidgety/restless 0  Suicidal thoughts 0  PHQ-9 Score 12  Difficult doing work/chores Somewhat difficult   Interpretation of Total Score  Total Score Depression Severity:  1-4 = Minimal depression, 5-9 = Mild depression, 10-14 = Moderate depression, 15-19 = Moderately severe depression, 20-27 = Severe depression   Psychosocial Evaluation and Intervention:   Psychosocial Re-Evaluation:  Psychosocial Re-Evaluation     Row Name 01/01/24 0836 01/29/24 1108           Psychosocial Re-Evaluation   Current issues with Current Depression;Current Anxiety/Panic;Current Sleep Concerns;Current Stress Concerns Current  Depression;Current Anxiety/Panic;Current Sleep Concerns;Current Stress Concerns      Comments PH9 Reviewed. Naira reports that she is not feeling as depressed. Lorren reports that she is sleeping better. Jillian says that she will reach out for counseling if needed. Anarie has not voiced any increased concerns or stressors during exercise at cardiac rehab.      Expected Outcomes Clatie will have controlled or decreased stress upon completion of cardiac rehab Andres will have controlled or decreased stress upon completion of cardiac rehab      Interventions Stress management education;Encouraged to attend Cardiac Rehabilitation for the exercise;Relaxation education Stress management education;Encouraged to attend Cardiac Rehabilitation for the exercise;Relaxation education      Continue Psychosocial Services  Follow up required by staff No Follow up required        Initial Review   Source of Stress Concerns Chronic Illness Chronic Illness      Comments Will continue to monitor and offer support as needed Will continue to monitor and offer support as needed               Psychosocial Discharge (Final Psychosocial Re-Evaluation):  Psychosocial Re-Evaluation - 01/29/24 1108       Psychosocial Re-Evaluation   Current issues with Current Depression;Current Anxiety/Panic;Current Sleep Concerns;Current Stress Concerns    Comments Keyira has not voiced any increased concerns or stressors during exercise at cardiac rehab.    Expected Outcomes Danea will have controlled or decreased stress upon completion  of cardiac rehab    Interventions Stress management education;Encouraged to attend Cardiac Rehabilitation for the exercise;Relaxation education    Continue Psychosocial Services  No Follow up required      Initial Review   Source of Stress Concerns Chronic Illness    Comments Will continue to monitor and offer support as needed             Vocational Rehabilitation: Provide vocational rehab  assistance to qualifying candidates.   Vocational Rehab Evaluation & Intervention:  Vocational Rehab - 12/02/23 1432       Initial Vocational Rehab Evaluation & Intervention   Assessment shows need for Vocational Rehabilitation No   Retired Runner, broadcasting/film/video            Education: Education Goals: Education classes will be provided on a weekly basis, covering required topics. Participant will state understanding/return demonstration of topics presented.    Education     Row Name 12/09/23 1400     Education   Cardiac Education Topics Pritikin   Psychologist, forensic Exercise Education   Exercise Education Biomechanial Limitations   Instruction Review Code 1- Verbalizes Understanding   Class Start Time 1400   Class Stop Time 1440   Class Time Calculation (min) 40 min    Row Name 12/11/23 1600     Education   Cardiac Education Topics Pritikin   Customer service manager   Weekly Topic Fast Evening Meals   Instruction Review Code 1- Verbalizes Understanding   Class Start Time 1400   Class Stop Time 1440   Class Time Calculation (min) 40 min    Row Name 12/13/23 1400     Education   Cardiac Education Topics Pritikin   Licensed conveyancer Nutrition   Nutrition Vitamins and Minerals   Instruction Review Code 1- Verbalizes Understanding   Class Start Time 1400   Class Stop Time 1440   Class Time Calculation (min) 40 min    Row Name 12/16/23 1500     Education   Cardiac Education Topics Pritikin   Glass blower/designer Nutrition   Nutrition Workshop Fueling a Forensic psychologist   Instruction Review Code 1- Teaching laboratory technician Start Time 1400   Class Stop Time 1440   Class Time Calculation (min) 40 min    Row Name 12/18/23 1400     Education   Cardiac Education Topics Pritikin    Customer service manager   Weekly Topic International Cuisine- Spotlight on the Blue Zones   Instruction Review Code 1- Verbalizes Understanding   Class Start Time 1400   Class Stop Time 1440   Class Time Calculation (min) 40 min    Row Name 12/23/23 1700     Education   Cardiac Education Topics Pritikin   Select Workshops     Workshops   Educator Exercise Physiologist   Select Psychosocial   Psychosocial Workshop Healthy Sleep for a Healthy Heart   Instruction Review Code 1- Verbalizes Understanding   Class Start Time 1403   Class Stop Time 1457   Class Time Calculation (min) 54 min    Row Name 12/25/23 1400     Education  Cardiac Education Topics Pritikin   Customer service manager   Weekly Topic Simple Sides and Sauces   Instruction Review Code 1- Verbalizes Understanding   Class Start Time 1355   Class Stop Time 1431   Class Time Calculation (min) 36 min    Row Name 12/27/23 1500     Education   Cardiac Education Topics Pritikin   Select Core Videos     Core Videos   Educator Exercise Physiologist   Select Psychosocial   Psychosocial How Our Thoughts Can Heal Our Hearts   Instruction Review Code 1- Verbalizes Understanding   Class Start Time 1400   Class Stop Time 1440   Class Time Calculation (min) 40 min    Row Name 12/30/23 1500     Education   Cardiac Education Topics Pritikin   Select Workshops     Workshops   Educator Exercise Physiologist   Select Exercise   Exercise Workshop Managing Heart Disease: Your Path to a Healthier Heart   Instruction Review Code 1- Verbalizes Understanding   Class Start Time 1400   Class Stop Time 1448   Class Time Calculation (min) 48 min    Row Name 01/01/24 1500     Education   Cardiac Education Topics Pritikin   Customer service manager   Weekly Topic Powerhouse Plant-Based Proteins    Instruction Review Code 1- Verbalizes Understanding   Class Start Time 1400   Class Stop Time 1435   Class Time Calculation (min) 35 min    Row Name 01/06/24 1400     Education   Cardiac Education Topics Pritikin   Geographical information systems officer Psychosocial   Psychosocial Workshop From Head to Heart: The Power of a Healthy Outlook   Instruction Review Code 1- Verbalizes Understanding   Class Start Time 1400   Class Stop Time 1449   Class Time Calculation (min) 49 min    Row Name 01/08/24 1600     Education   Cardiac Education Topics Pritikin   Customer service manager   Weekly Topic Tasty Appetizers and Snacks   Instruction Review Code 1- Verbalizes Understanding   Class Start Time 1400   Class Stop Time 1445   Class Time Calculation (min) 45 min    Row Name 01/10/24 1400     Education   Cardiac Education Topics Pritikin   Hospital doctor Education   General Education Heart Disease Risk Reduction   Instruction Review Code 1- Verbalizes Understanding   Class Start Time 1400   Class Stop Time 1445   Class Time Calculation (min) 45 min    Row Name 01/13/24 1600     Education   Cardiac Education Topics Pritikin   Nurse, children's Exercise Physiologist   Select Psychosocial   Psychosocial Healthy Minds, Bodies, Hearts   Instruction Review Code 1- Verbalizes Understanding   Class Start Time 1410   Class Stop Time 1445   Class Time Calculation (min) 35 min    Row Name 01/27/24 1500     Education   Cardiac Education Topics Pritikin   Psychologist, sport and exercise     Core  Museum/gallery exhibitions officer Education Metabolic Syndrome and Belly Fat   Instruction Review Code 1- Verbalizes Understanding   Class Start Time 1355   Class Stop Time  1440   Class Time Calculation (min) 45 min            Core Videos: Exercise    Move It!  Clinical staff conducted group or individual video education with verbal and written material and guidebook.  Patient learns the recommended Pritikin exercise program. Exercise with the goal of living a long, healthy life. Some of the health benefits of exercise include controlled diabetes, healthier blood pressure levels, improved cholesterol levels, improved heart and lung capacity, improved sleep, and better body composition. Everyone should speak with their doctor before starting or changing an exercise routine.  Biomechanical Limitations Clinical staff conducted group or individual video education with verbal and written material and guidebook.  Patient learns how biomechanical limitations can impact exercise and how we can mitigate and possibly overcome limitations to have an impactful and balanced exercise routine.  Body Composition Clinical staff conducted group or individual video education with verbal and written material and guidebook.  Patient learns that body composition (ratio of muscle mass to fat mass) is a key component to assessing overall fitness, rather than body weight alone. Increased fat mass, especially visceral belly fat, can put us  at increased risk for metabolic syndrome, type 2 diabetes, heart disease, and even death. It is recommended to combine diet and exercise (cardiovascular and resistance training) to improve your body composition. Seek guidance from your physician and exercise physiologist before implementing an exercise routine.  Exercise Action Plan Clinical staff conducted group or individual video education with verbal and written material and guidebook.  Patient learns the recommended strategies to achieve and enjoy long-term exercise adherence, including variety, self-motivation, self-efficacy, and positive decision making. Benefits of exercise include fitness,  good health, weight management, more energy, better sleep, less stress, and overall well-being.  Medical   Heart Disease Risk Reduction Clinical staff conducted group or individual video education with verbal and written material and guidebook.  Patient learns our heart is our most vital organ as it circulates oxygen, nutrients, white blood cells, and hormones throughout the entire body, and carries waste away. Data supports a plant-based eating plan like the Pritikin Program for its effectiveness in slowing progression of and reversing heart disease. The video provides a number of recommendations to address heart disease.   Metabolic Syndrome and Belly Fat  Clinical staff conducted group or individual video education with verbal and written material and guidebook.  Patient learns what metabolic syndrome is, how it leads to heart disease, and how one can reverse it and keep it from coming back. You have metabolic syndrome if you have 3 of the following 5 criteria: abdominal obesity, high blood pressure, high triglycerides, low HDL cholesterol, and high blood sugar.  Hypertension and Heart Disease Clinical staff conducted group or individual video education with verbal and written material and guidebook.  Patient learns that high blood pressure, or hypertension, is very common in the [place] . Hypertension is largely due to excessive salt intake, but other important risk factors include being overweight, physical inactivity, drinking too much alcohol, smoking, and not eating enough potassium from fruits and vegetables. High blood pressure is a leading risk factor for heart attack, stroke, congestive heart failure, dementia, kidney failure, and premature death. Long-term effects of excessive salt intake include stiffening of  the arteries and thickening of heart muscle and organ damage. Recommendations include ways to reduce hypertension and the risk of heart disease.  Diseases of Our Time -  Focusing on Diabetes Clinical staff conducted group or individual video education with verbal and written material and guidebook.  Patient learns why the best way to stop diseases of our time is prevention, through food and other lifestyle changes. Medicine (such as prescription pills and surgeries) is often only a Band-Aid on the problem, not a long-term solution. Most common diseases of our time include obesity, type 2 diabetes, hypertension, heart disease, and cancer. The Pritikin Program is recommended and has been proven to help reduce, reverse, and/or prevent the damaging effects of metabolic syndrome.  Nutrition   Overview of the Pritikin Eating Plan  Clinical staff conducted group or individual video education with verbal and written material and guidebook.  Patient learns about the Pritikin Eating Plan for disease risk reduction. The Pritikin Eating Plan emphasizes a wide variety of unrefined, minimally-processed carbohydrates, like fruits, vegetables, whole grains, and legumes. Go, Caution, and Stop food choices are explained. Plant-based and lean animal proteins are emphasized. Rationale provided for low sodium intake for blood pressure control, low added sugars for blood sugar stabilization, and low added fats and oils for coronary artery disease risk reduction and weight management.  Calorie Density  Clinical staff conducted group or individual video education with verbal and written material and guidebook.  Patient learns about calorie density and how it impacts the Pritikin Eating Plan. Knowing the characteristics of the food you choose will help you decide whether those foods will lead to weight gain or weight loss, and whether you want to consume more or less of them. Weight loss is usually a side effect of the Pritikin Eating Plan because of its focus on low calorie-dense foods.  Label Reading  Clinical staff conducted group or individual video education with verbal and written  material and guidebook.  Patient learns about the Pritikin recommended label reading guidelines and corresponding recommendations regarding calorie density, added sugars, sodium content, and whole grains.  Dining Out - Part 1  Clinical staff conducted group or individual video education with verbal and written material and guidebook.  Patient learns that restaurant meals can be sabotaging because they can be so high in calories, fat, sodium, and/or sugar. Patient learns recommended strategies on how to positively address this and avoid unhealthy pitfalls.  Facts on Fats  Clinical staff conducted group or individual video education with verbal and written material and guidebook.  Patient learns that lifestyle modifications can be just as effective, if not more so, as many medications for lowering your risk of heart disease. A Pritikin lifestyle can help to reduce your risk of inflammation and atherosclerosis (cholesterol build-up, or plaque, in the artery walls). Lifestyle interventions such as dietary choices and physical activity address the cause of atherosclerosis. A review of the types of fats and their impact on blood cholesterol levels, along with dietary recommendations to reduce fat intake is also included.  Nutrition Action Plan  Clinical staff conducted group or individual video education with verbal and written material and guidebook.  Patient learns how to incorporate Pritikin recommendations into their lifestyle. Recommendations include planning and keeping personal health goals in mind as an important part of their success.  Healthy Mind-Set    Healthy Minds, Bodies, Hearts  Clinical staff conducted group or individual video education with verbal and written material and guidebook.  Patient learns how to identify  when they are stressed. Video will discuss the impact of that stress, as well as the many benefits of stress management. Patient will also be introduced to stress management  techniques. The way we think, act, and feel has an impact on our hearts.  How Our Thoughts Can Heal Our Hearts  Clinical staff conducted group or individual video education with verbal and written material and guidebook.  Patient learns that negative thoughts can cause depression and anxiety. This can result in negative lifestyle behavior and serious health problems. Cognitive behavioral therapy is an effective method to help control our thoughts in order to change and improve our emotional outlook.  Additional Videos:  Exercise    Improving Performance  Clinical staff conducted group or individual video education with verbal and written material and guidebook.  Patient learns to use a non-linear approach by alternating intensity levels and lengths of time spent exercising to help burn more calories and lose more body fat. Cardiovascular exercise helps improve heart health, metabolism, hormonal balance, blood sugar control, and recovery from fatigue. Resistance training improves strength, endurance, balance, coordination, reaction time, metabolism, and muscle mass. Flexibility exercise improves circulation, posture, and balance. Seek guidance from your physician and exercise physiologist before implementing an exercise routine and learn your capabilities and proper form for all exercise.  Introduction to Yoga  Clinical staff conducted group or individual video education with verbal and written material and guidebook.  Patient learns about yoga, a discipline of the coming together of mind, breath, and body. The benefits of yoga include improved flexibility, improved range of motion, better posture and core strength, increased lung function, weight loss, and positive self-image. Yoga's heart health benefits include lowered blood pressure, healthier heart rate, decreased cholesterol and triglyceride levels, improved immune function, and reduced stress. Seek guidance from your physician and exercise  physiologist before implementing an exercise routine and learn your capabilities and proper form for all exercise.  Medical   Aging: Enhancing Your Quality of Life  Clinical staff conducted group or individual video education with verbal and written material and guidebook.  Patient learns key strategies and recommendations to stay in good physical health and enhance quality of life, such as prevention strategies, having an advocate, securing a Health Care Proxy and Power of Attorney, and keeping a list of medications and system for tracking them. It also discusses how to avoid risk for bone loss.  Biology of Weight Control  Clinical staff conducted group or individual video education with verbal and written material and guidebook.  Patient learns that weight gain occurs because we consume more calories than we burn (eating more, moving less). Even if your body weight is normal, you may have higher ratios of fat compared to muscle mass. Too much body fat puts you at increased risk for cardiovascular disease, heart attack, stroke, type 2 diabetes, and obesity-related cancers. In addition to exercise, following the Pritikin Eating Plan can help reduce your risk.  Decoding Lab Results  Clinical staff conducted group or individual video education with verbal and written material and guidebook.  Patient learns that lab test reflects one measurement whose values change over time and are influenced by many factors, including medication, stress, sleep, exercise, food, hydration, pre-existing medical conditions, and more. It is recommended to use the knowledge from this video to become more involved with your lab results and evaluate your numbers to speak with your doctor.   Diseases of Our Time - Overview  Clinical staff conducted group or individual video education  with verbal and written material and guidebook.  Patient learns that according to the CDC, 50% to 70% of chronic diseases (such as obesity,  type 2 diabetes, elevated lipids, hypertension, and heart disease) are avoidable through lifestyle improvements including healthier food choices, listening to satiety cues, and increased physical activity.  Sleep Disorders Clinical staff conducted group or individual video education with verbal and written material and guidebook.  Patient learns how good quality and duration of sleep are important to overall health and well-being. Patient also learns about sleep disorders and how they impact health along with recommendations to address them, including discussing with a physician.  Nutrition  Dining Out - Part 2 Clinical staff conducted group or individual video education with verbal and written material and guidebook.  Patient learns how to plan ahead and communicate in order to maximize their dining experience in a healthy and nutritious manner. Included are recommended food choices based on the type of restaurant the patient is visiting.   Fueling a Banker conducted group or individual video education with verbal and written material and guidebook.  There is a strong connection between our food choices and our health. Diseases like obesity and type 2 diabetes are very prevalent and are in large-part due to lifestyle choices. The Pritikin Eating Plan provides plenty of food and hunger-curbing satisfaction. It is easy to follow, affordable, and helps reduce health risks.  Menu Workshop  Clinical staff conducted group or individual video education with verbal and written material and guidebook.  Patient learns that restaurant meals can sabotage health goals because they are often packed with calories, fat, sodium, and sugar. Recommendations include strategies to plan ahead and to communicate with the manager, chef, or server to help order a healthier meal.  Planning Your Eating Strategy  Clinical staff conducted group or individual video education with verbal and written  material and guidebook.  Patient learns about the Pritikin Eating Plan and its benefit of reducing the risk of disease. The Pritikin Eating Plan does not focus on calories. Instead, it emphasizes high-quality, nutrient-rich foods. By knowing the characteristics of the foods, we choose, we can determine their calorie density and make informed decisions.  Targeting Your Nutrition Priorities  Clinical staff conducted group or individual video education with verbal and written material and guidebook.  Patient learns that lifestyle habits have a tremendous impact on disease risk and progression. This video provides eating and physical activity recommendations based on your personal health goals, such as reducing LDL cholesterol, losing weight, preventing or controlling type 2 diabetes, and reducing high blood pressure.  Vitamins and Minerals  Clinical staff conducted group or individual video education with verbal and written material and guidebook.  Patient learns different ways to obtain key vitamins and minerals, including through a recommended healthy diet. It is important to discuss all supplements you take with your doctor.   Healthy Mind-Set    Smoking Cessation  Clinical staff conducted group or individual video education with verbal and written material and guidebook.  Patient learns that cigarette smoking and tobacco addiction pose a serious health risk which affects millions of people. Stopping smoking will significantly reduce the risk of heart disease, lung disease, and many forms of cancer. Recommended strategies for quitting are covered, including working with your doctor to develop a successful plan.  Culinary   Becoming a Set designer conducted group or individual video education with verbal and written material and guidebook.  Patient learns that cooking  at home can be healthy, cost-effective, quick, and puts them in control. Keys to cooking healthy recipes will  include looking at your recipe, assessing your equipment needs, planning ahead, making it simple, choosing cost-effective seasonal ingredients, and limiting the use of added fats, salts, and sugars.  Cooking - Breakfast and Snacks  Clinical staff conducted group or individual video education with verbal and written material and guidebook.  Patient learns how important breakfast is to satiety and nutrition through the entire day. Recommendations include key foods to eat during breakfast to help stabilize blood sugar levels and to prevent overeating at meals later in the day. Planning ahead is also a key component.  Cooking - Educational psychologist conducted group or individual video education with verbal and written material and guidebook.  Patient learns eating strategies to improve overall health, including an approach to cook more at home. Recommendations include thinking of animal protein as a side on your plate rather than center stage and focusing instead on lower calorie dense options like vegetables, fruits, whole grains, and plant-based proteins, such as beans. Making sauces in large quantities to freeze for later and leaving the skin on your vegetables are also recommended to maximize your experience.  Cooking - Healthy Salads and Dressing Clinical staff conducted group or individual video education with verbal and written material and guidebook.  Patient learns that vegetables, fruits, whole grains, and legumes are the foundations of the Pritikin Eating Plan. Recommendations include how to incorporate each of these in flavorful and healthy salads, and how to create homemade salad dressings. Proper handling of ingredients is also covered. Cooking - Soups and [employer] - Soups and Desserts Clinical staff conducted group or individual video education with verbal and written material and guidebook.  Patient learns that Pritikin soups and desserts make for easy, nutritious, and  delicious snacks and meal components that are low in sodium, fat, sugar, and calorie density, while high in vitamins, minerals, and filling fiber. Recommendations include simple and healthy ideas for soups and desserts.   Overview     The Pritikin Solution Program Overview Clinical staff conducted group or individual video education with verbal and written material and guidebook.  Patient learns that the results of the Pritikin Program have been documented in more than 100 articles published in peer-reviewed journals, and the benefits include reducing risk factors for (and, in some cases, even reversing) high cholesterol, high blood pressure, type 2 diabetes, obesity, and more! An overview of the three key pillars of the Pritikin Program will be covered: eating well, doing regular exercise, and having a healthy mind-set.  WORKSHOPS  Exercise: Exercise Basics: Building Your Action Plan Clinical staff led group instruction and group discussion with PowerPoint presentation and patient guidebook. To enhance the learning environment the use of posters, models and videos may be added. At the conclusion of this workshop, patients will comprehend the difference between physical activity and exercise, as well as the benefits of incorporating both, into their routine. Patients will understand the FITT (Frequency, Intensity, Time, and Type) principle and how to use it to build an exercise action plan. In addition, safety concerns and other considerations for exercise and cardiac rehab will be addressed by the presenter. The purpose of this lesson is to promote a comprehensive and effective weekly exercise routine in order to improve patients' overall level of fitness.   Managing Heart Disease: Your Path to a Healthier Heart Clinical staff led group instruction and group discussion with  PowerPoint presentation and patient guidebook. To enhance the learning environment the use of posters, models and videos  may be added.At the conclusion of this workshop, patients will understand the anatomy and physiology of the heart. Additionally, they will understand how Pritikin's three pillars impact the risk factors, the progression, and the management of heart disease.  The purpose of this lesson is to provide a high-level overview of the heart, heart disease, and how the Pritikin lifestyle positively impacts risk factors.  Exercise Biomechanics Clinical staff led group instruction and group discussion with PowerPoint presentation and patient guidebook. To enhance the learning environment the use of posters, models and videos may be added. Patients will learn how the structural parts of their bodies function and how these functions impact their daily activities, movement, and exercise. Patients will learn how to promote a neutral spine, learn how to manage pain, and identify ways to improve their physical movement in order to promote healthy living. The purpose of this lesson is to expose patients to common physical limitations that impact physical activity. Participants will learn practical ways to adapt and manage aches and pains, and to minimize their effect on regular exercise. Patients will learn how to maintain good posture while sitting, walking, and lifting.  Balance Training and Fall Prevention  Clinical staff led group instruction and group discussion with PowerPoint presentation and patient guidebook. To enhance the learning environment the use of posters, models and videos may be added. At the conclusion of this workshop, patients will understand the importance of their sensorimotor skills (vision, proprioception, and the vestibular system) in maintaining their ability to balance as they age. Patients will apply a variety of balancing exercises that are appropriate for their current level of function. Patients will understand the common causes for poor balance, possible solutions to these  problems, and ways to modify their physical environment in order to minimize their fall risk. The purpose of this lesson is to teach patients about the importance of maintaining balance as they age and ways to minimize their risk of falling.  WORKSHOPS   Nutrition:  Fueling a Ship broker led group instruction and group discussion with PowerPoint presentation and patient guidebook. To enhance the learning environment the use of posters, models and videos may be added. Patients will review the foundational principles of the Pritikin Eating Plan and understand what constitutes a serving size in each of the food groups. Patients will also learn Pritikin-friendly foods that are better choices when away from home and review make-ahead meal and snack options. Calorie density will be reviewed and applied to three nutrition priorities: weight maintenance, weight loss, and weight gain. The purpose of this lesson is to reinforce (in a group setting) the key concepts around what patients are recommended to eat and how to apply these guidelines when away from home by planning and selecting Pritikin-friendly options. Patients will understand how calorie density may be adjusted for different weight management goals.  Mindful Eating  Clinical staff led group instruction and group discussion with PowerPoint presentation and patient guidebook. To enhance the learning environment the use of posters, models and videos may be added. Patients will briefly review the concepts of the Pritikin Eating Plan and the importance of low-calorie dense foods. The concept of mindful eating will be introduced as well as the importance of paying attention to internal hunger signals. Triggers for non-hunger eating and techniques for dealing with triggers will be explored. The purpose of this lesson is to provide patients with  the opportunity to review the basic principles of the Pritikin Eating Plan, discuss the value of  eating mindfully and how to measure internal cues of hunger and fullness using the Hunger Scale. Patients will also discuss reasons for non-hunger eating and learn strategies to use for controlling emotional eating.  Targeting Your Nutrition Priorities Clinical staff led group instruction and group discussion with PowerPoint presentation and patient guidebook. To enhance the learning environment the use of posters, models and videos may be added. Patients will learn how to determine their genetic susceptibility to disease by reviewing their family history. Patients will gain insight into the importance of diet as part of an overall healthy lifestyle in mitigating the impact of genetics and other environmental insults. The purpose of this lesson is to provide patients with the opportunity to assess their personal nutrition priorities by looking at their family history, their own health history and current risk factors. Patients will also be able to discuss ways of prioritizing and modifying the Pritikin Eating Plan for their highest risk areas  Menu  Clinical staff led group instruction and group discussion with PowerPoint presentation and patient guidebook. To enhance the learning environment the use of posters, models and videos may be added. Using menus brought in from [employer], or printed from [employer], patients will apply the Pritikin dining out guidelines that were presented in the [employer] video. Patients will also be able to practice these guidelines in a variety of provided scenarios. The purpose of this lesson is to provide patients with the opportunity to practice hands-on learning of the Pritikin Dining Out guidelines with actual menus and practice scenarios.  Label Reading Clinical staff led group instruction and group discussion with PowerPoint presentation and patient guidebook. To enhance the learning environment the use of posters, models and videos may be  added. Patients will review and discuss the Pritikin label reading guidelines presented in Pritikin's Label Reading Educational series video. Using fool labels brought in from local grocery stores and markets, patients will apply the label reading guidelines and determine if the packaged food meet the Pritikin guidelines. The purpose of this lesson is to provide patients with the opportunity to review, discuss, and practice hands-on learning of the Pritikin Label Reading guidelines with actual packaged food labels. Cooking School  Pritikin's [employer] are designed to teach patients ways to prepare quick, simple, and affordable recipes at home. The importance of nutrition's role in chronic disease risk reduction is reflected in its emphasis in the overall Pritikin program. By learning how to prepare essential core Pritikin Eating Plan recipes, patients will increase control over what they eat; be able to customize the flavor of foods without the use of added salt, sugar, or fat; and improve the quality of the food they consume. By learning a set of core recipes which are easily assembled, quickly prepared, and affordable, patients are more likely to prepare more healthy foods at home. These workshops focus on convenient breakfasts, simple entres, side dishes, and desserts which can be prepared with minimal effort and are consistent with nutrition recommendations for cardiovascular risk reduction. Cooking [employer] are taught by a Armed forces logistics/support/administrative officer (RD) who has been trained by the [employer]. The chef or RD has a clear understanding of the importance of minimizing - if not completely eliminating - added fat, sugar, and sodium in recipes. Throughout the series of Cooking School Workshop sessions, patients will learn about healthy ingredients and efficient methods  of cooking to build confidence in their capability to prepare    Cooking School weekly topics:  Adding  Flavor- Sodium-Free  Fast and Healthy Breakfasts  Powerhouse Plant-Based Proteins  Satisfying Salads and Dressings  Simple Sides and Sauces  International Cuisine-Spotlight on the [employer] Zones  Delicious Desserts  Savory Soups  [employer] - Meals in a Snap  Tasty Appetizers and Snacks  Comforting Weekend Breakfasts  One-Pot Wonders   Fast Evening Meals  Landscape architect Your Pritikin Plate  WORKSHOPS   Healthy Mindset (Psychosocial):  Focused Goals, Sustainable Changes Clinical staff led group instruction and group discussion with PowerPoint presentation and patient guidebook. To enhance the learning environment the use of posters, models and videos may be added. Patients will be able to apply effective goal setting strategies to establish at least one personal goal, and then take consistent, meaningful action toward that goal. They will learn to identify common barriers to achieving personal goals and develop strategies to overcome them. Patients will also gain an understanding of how our mind-set can impact our ability to achieve goals and the importance of cultivating a positive and growth-oriented mind-set. The purpose of this lesson is to provide patients with a deeper understanding of how to set and achieve personal goals, as well as the tools and strategies needed to overcome common obstacles which may arise along the way.  From Head to Heart: The Power of a Healthy Outlook  Clinical staff led group instruction and group discussion with PowerPoint presentation and patient guidebook. To enhance the learning environment the use of posters, models and videos may be added. Patients will be able to recognize and describe the impact of emotions and mood on physical health. They will discover the importance of self-care and explore self-care practices which may work for them. Patients will also learn how to utilize the 4 C's to cultivate a healthier outlook and better  manage stress and challenges. The purpose of this lesson is to demonstrate to patients how a healthy outlook is an essential part of maintaining good health, especially as they continue their cardiac rehab journey.  Healthy Sleep for a Healthy Heart Clinical staff led group instruction and group discussion with PowerPoint presentation and patient guidebook. To enhance the learning environment the use of posters, models and videos may be added. At the conclusion of this workshop, patients will be able to demonstrate knowledge of the importance of sleep to overall health, well-being, and quality of life. They will understand the symptoms of, and treatments for, common sleep disorders. Patients will also be able to identify daytime and nighttime behaviors which impact sleep, and they will be able to apply these tools to help manage sleep-related challenges. The purpose of this lesson is to provide patients with a general overview of sleep and outline the importance of quality sleep. Patients will learn about a few of the most common sleep disorders. Patients will also be introduced to the concept of "sleep hygiene," and discover ways to self-manage certain sleeping problems through simple daily behavior changes. Finally, the workshop will motivate patients by clarifying the links between quality sleep and their goals of heart-healthy living.   Recognizing and Reducing Stress Clinical staff led group instruction and group discussion with PowerPoint presentation and patient guidebook. To enhance the learning environment the use of posters, models and videos may be added. At the conclusion of this workshop, patients will be able to understand the types of stress reactions, differentiate between acute and chronic stress,  and recognize the impact that chronic stress has on their health. They will also be able to apply different coping mechanisms, such as reframing negative self-talk. Patients will have the opportunity  to practice a variety of stress management techniques, such as deep abdominal breathing, progressive muscle relaxation, and/or guided imagery.  The purpose of this lesson is to educate patients on the role of stress in their lives and to provide healthy techniques for coping with it.  Learning Barriers/Preferences:  Learning Barriers/Preferences - 12/02/23 1432       Learning Barriers/Preferences   Learning Barriers Sight;Hearing   glasses/hearing aids   Learning Preferences Audio;Computer/Internet;Group Instruction;Individual Instruction;Pictoral;Skilled Demonstration;Verbal Instruction;Video;Written Material             Education Topics:  Knowledge Questionnaire Score:  Knowledge Questionnaire Score - 12/02/23 1432       Knowledge Questionnaire Score   Pre Score 22/24             Core Components/Risk Factors/Patient Goals at Admission:  Personal Goals and Risk Factors at Admission - 12/02/23 1432       Core Components/Risk Factors/Patient Goals on Admission    Weight Management Yes    Intervention Weight Management: Develop a combined nutrition and exercise program designed to reach desired caloric intake, while maintaining appropriate intake of nutrient and fiber, sodium and fats, and appropriate energy expenditure required for the weight goal.;Weight Management: Provide education and appropriate resources to help participant work on and attain dietary goals.    Expected Outcomes Short Term: Continue to assess and modify interventions until short term weight is achieved;Long Term: Adherence to nutrition and physical activity/exercise program aimed toward attainment of established weight goal;Understanding recommendations for meals to include 15-35% energy as protein, 25-35% energy from fat, 35-60% energy from carbohydrates, less than 200mg  of dietary cholesterol, 20-35 gm of total fiber daily;Understanding of distribution of calorie intake throughout the day with the  consumption of 4-5 meals/snacks    Hypertension Yes    Intervention Provide education on lifestyle modifcations including regular physical activity/exercise, weight management, moderate sodium restriction and increased consumption of fresh fruit, vegetables, and low fat dairy, alcohol moderation, and smoking cessation.;Monitor prescription use compliance.    Expected Outcomes Short Term: Continued assessment and intervention until BP is < 140/1mm HG in hypertensive participants. < 130/52mm HG in hypertensive participants with diabetes, heart failure or chronic kidney disease.;Long Term: Maintenance of blood pressure at goal levels.    Lipids Yes    Intervention Provide education and support for participant on nutrition & aerobic/resistive exercise along with prescribed medications to achieve LDL 70mg , HDL >40mg .    Expected Outcomes Short Term: Participant states understanding of desired cholesterol values and is compliant with medications prescribed. Participant is following exercise prescription and nutrition guidelines.;Long Term: Cholesterol controlled with medications as prescribed, with individualized exercise RX and with personalized nutrition plan. Value goals: LDL < 70mg , HDL > 40 mg.    Stress Yes    Intervention Offer individual and/or small group education and counseling on adjustment to heart disease, stress management and health-related lifestyle change. Teach and support self-help strategies.;Refer participants experiencing significant psychosocial distress to appropriate mental health specialists for further evaluation and treatment. When possible, include family members and significant others in education/counseling sessions.    Expected Outcomes Short Term: Participant demonstrates changes in health-related behavior, relaxation and other stress management skills, ability to obtain effective social support, and compliance with psychotropic medications if prescribed.;Long Term: Emotional  wellbeing is indicated by absence of clinically  significant psychosocial distress or social isolation.             Core Components/Risk Factors/Patient Goals Review:   Goals and Risk Factor Review     Row Name 01/01/24 2440 01/29/24 1112           Core Components/Risk Factors/Patient Goals Review   Personal Goals Review Weight Management/Obesity;Hypertension;Lipids;Stress Weight Management/Obesity;Hypertension;Lipids;Stress      Review Cayle has been doing well with exercise at cardiac rehab. Vital signs have been stable. Raniah has gained 3 kg since starting the program Khaleelah continues to do  well with exercise at cardiac rehab. Vital signs remain stable. Coraima has gained 2.2 kg since starting the program      Expected Outcomes Lamerle will continue to participate in cardiac rehab for exercise nutrtion and lifestyle modifications Mikhayla will continue to participate in cardiac rehab for exercise nutrtion and lifestyle modifications               Core Components/Risk Factors/Patient Goals at Discharge (Final Review):   Goals and Risk Factor Review - 01/29/24 1112       Core Components/Risk Factors/Patient Goals Review   Personal Goals Review Weight Management/Obesity;Hypertension;Lipids;Stress    Review Jalyn continues to do  well with exercise at cardiac rehab. Vital signs remain stable. Jerusalen has gained 2.2 kg since starting the program    Expected Outcomes Anjoli will continue to participate in cardiac rehab for exercise nutrtion and lifestyle modifications             ITP Comments:  ITP Comments     Row Name 12/02/23 1315 12/09/23 1515 01/01/24 0834 01/29/24 1106     ITP Comments Dr. Gaylyn Keas medical director. Introduction to pritikin education/intensive cardiac rehab. Initial orientation packet reviewed with patient. 30 Day ITP Review. Shametra started cardiac rehab on 12/09/23. Darlis did well with exercise. 30 Day ITP Review. Milisa has good attendance and participation  with exercise at cardiac rehab 30 Day ITP Review. Lakishia continues to have  good attendance and participation with exercise at cardiac rehab. Ranelle was absent from cardiac rehab due to being out of town/ Vacation             Comments: See ITP comments.Monte Antonio RN BSN

## 2024-01-31 ENCOUNTER — Encounter (HOSPITAL_COMMUNITY)
Admission: RE | Admit: 2024-01-31 | Discharge: 2024-01-31 | Disposition: A | Discharge status: Home / Self Care | Source: Ambulatory Visit | Attending: Cardiology | Admitting: Cardiology

## 2024-01-31 DIAGNOSIS — Z952 Presence of prosthetic heart valve: Secondary | ICD-10-CM | POA: Diagnosis not present

## 2024-02-05 ENCOUNTER — Encounter (HOSPITAL_COMMUNITY)
Admission: RE | Admit: 2024-02-05 | Discharge: 2024-02-05 | Disposition: A | Discharge status: Home / Self Care | Source: Ambulatory Visit | Attending: Cardiology | Admitting: Cardiology

## 2024-02-05 DIAGNOSIS — Z952 Presence of prosthetic heart valve: Secondary | ICD-10-CM | POA: Diagnosis not present

## 2024-02-07 ENCOUNTER — Encounter (HOSPITAL_COMMUNITY)
Admission: RE | Admit: 2024-02-07 | Discharge: 2024-02-07 | Disposition: A | Discharge status: Home / Self Care | Source: Ambulatory Visit | Attending: Cardiology | Admitting: Cardiology

## 2024-02-07 DIAGNOSIS — Z952 Presence of prosthetic heart valve: Secondary | ICD-10-CM | POA: Diagnosis not present

## 2024-02-10 ENCOUNTER — Other Ambulatory Visit: Payer: Self-pay | Admitting: Physician Assistant

## 2024-02-10 ENCOUNTER — Encounter (HOSPITAL_COMMUNITY)
Admission: RE | Admit: 2024-02-10 | Discharge: 2024-02-10 | Disposition: A | Discharge status: Home / Self Care | Source: Ambulatory Visit | Attending: Cardiology | Admitting: Cardiology

## 2024-02-10 DIAGNOSIS — Z48812 Encounter for surgical aftercare following surgery on the circulatory system: Secondary | ICD-10-CM | POA: Insufficient documentation

## 2024-02-10 DIAGNOSIS — Z952 Presence of prosthetic heart valve: Secondary | ICD-10-CM | POA: Diagnosis not present

## 2024-02-12 ENCOUNTER — Encounter (HOSPITAL_COMMUNITY)
Admission: RE | Admit: 2024-02-12 | Discharge: 2024-02-12 | Disposition: A | Discharge status: Home / Self Care | Source: Ambulatory Visit | Attending: Cardiology | Admitting: Cardiology

## 2024-02-13 ENCOUNTER — Telehealth: Payer: Self-pay | Admitting: Cardiology

## 2024-02-13 MED ORDER — CARVEDILOL 12.5 MG PO TABS: 12.5000 mg | ORAL_TABLET | Freq: Two times a day (BID) | ORAL | 3 refills | Status: AC

## 2024-02-13 NOTE — Telephone Encounter (Signed)
*  STAT* If patient is at the pharmacy, call can be transferred to refill team.   1. Which medications need to be refilled? (please list name of each medication and dose if known)   carvedilol  (COREG ) 12.5 MG tablet    4. Which pharmacy/location (including street and city if local pharmacy) is medication to be sent to?  [id number] - [place], [place] - [address] AT [facility] OF [address]     5. Do they need a 30 day or 90 day supply? 90

## 2024-02-13 NOTE — Telephone Encounter (Signed)
 Pt's medication was sent to pt's pharmacy as requested. Confirmation received.

## 2024-02-14 ENCOUNTER — Encounter (HOSPITAL_COMMUNITY): Admission: RE | Admit: 2024-02-14 | Source: Ambulatory Visit

## 2024-02-17 ENCOUNTER — Encounter (HOSPITAL_COMMUNITY)
Admission: RE | Admit: 2024-02-17 | Discharge: 2024-02-17 | Disposition: A | Discharge status: Home / Self Care | Source: Ambulatory Visit | Attending: Cardiology | Admitting: Cardiology

## 2024-02-17 DIAGNOSIS — Z952 Presence of prosthetic heart valve: Secondary | ICD-10-CM

## 2024-02-17 DIAGNOSIS — Z48812 Encounter for surgical aftercare following surgery on the circulatory system: Secondary | ICD-10-CM | POA: Diagnosis not present

## 2024-02-18 ENCOUNTER — Telehealth: Payer: Self-pay | Admitting: Cardiology

## 2024-02-18 NOTE — Telephone Encounter (Signed)
Returned call to patient left message on personal voice mail to call back. 

## 2024-02-18 NOTE — Telephone Encounter (Signed)
 Pt c/o medication issue:  1. Name of Medication:   carvedilol  (COREG ) 12.5 MG tablet   2. How are you currently taking this medication (dosage and times per day)?   As prescribed  3. Are you having a reaction (difficulty breathing--STAT)?   4. What is your medication issue?   Patient wants a call back to confirm if she should still be taking this medication and the dosage.

## 2024-02-18 NOTE — Telephone Encounter (Signed)
 Returned call to pt- no answer, unable to leave VM- no DPR on file

## 2024-02-18 NOTE — Telephone Encounter (Signed)
 Called patient left message on personal voice mail to call back.

## 2024-02-19 ENCOUNTER — Encounter (HOSPITAL_COMMUNITY)
Admission: RE | Admit: 2024-02-19 | Discharge: 2024-02-19 | Disposition: A | Discharge status: Home / Self Care | Source: Ambulatory Visit | Attending: Cardiology | Admitting: Cardiology

## 2024-02-19 DIAGNOSIS — Z48812 Encounter for surgical aftercare following surgery on the circulatory system: Secondary | ICD-10-CM | POA: Diagnosis not present

## 2024-02-19 DIAGNOSIS — Z952 Presence of prosthetic heart valve: Secondary | ICD-10-CM

## 2024-02-19 DIAGNOSIS — W460XXA Contact with hypodermic needle, initial encounter: Secondary | ICD-10-CM | POA: Diagnosis not present

## 2024-02-19 DIAGNOSIS — Z7721 Contact with and (suspected) exposure to potentially hazardous body fluids: Secondary | ICD-10-CM | POA: Diagnosis not present

## 2024-02-19 NOTE — Telephone Encounter (Signed)
 Left message for pt to call.

## 2024-02-20 NOTE — Telephone Encounter (Signed)
 Left message for patient to continue carvedilol  and to call with questions.

## 2024-02-21 ENCOUNTER — Encounter (HOSPITAL_COMMUNITY)
Admission: RE | Admit: 2024-02-21 | Discharge: 2024-02-21 | Disposition: A | Discharge status: Home / Self Care | Source: Ambulatory Visit | Attending: Cardiology | Admitting: Cardiology

## 2024-02-21 DIAGNOSIS — Z952 Presence of prosthetic heart valve: Secondary | ICD-10-CM

## 2024-02-21 DIAGNOSIS — Z48812 Encounter for surgical aftercare following surgery on the circulatory system: Secondary | ICD-10-CM | POA: Diagnosis not present

## 2024-02-24 ENCOUNTER — Encounter (HOSPITAL_COMMUNITY): Admission: RE | Admit: 2024-02-24 | Source: Ambulatory Visit

## 2024-02-24 DIAGNOSIS — L814 Other melanin hyperpigmentation: Secondary | ICD-10-CM | POA: Diagnosis not present

## 2024-02-24 DIAGNOSIS — D2372 Other benign neoplasm of skin of left lower limb, including hip: Secondary | ICD-10-CM | POA: Diagnosis not present

## 2024-02-24 DIAGNOSIS — L821 Other seborrheic keratosis: Secondary | ICD-10-CM | POA: Diagnosis not present

## 2024-02-26 ENCOUNTER — Encounter (HOSPITAL_COMMUNITY)
Admission: RE | Admit: 2024-02-26 | Discharge: 2024-02-26 | Disposition: A | Discharge status: Home / Self Care | Source: Ambulatory Visit | Attending: Cardiology | Admitting: Cardiology

## 2024-02-26 VITALS — Ht 63.0 in | Wt 125.7 lb

## 2024-02-26 DIAGNOSIS — Z952 Presence of prosthetic heart valve: Secondary | ICD-10-CM

## 2024-02-26 DIAGNOSIS — Z48812 Encounter for surgical aftercare following surgery on the circulatory system: Secondary | ICD-10-CM | POA: Diagnosis not present

## 2024-02-26 NOTE — Progress Notes (Signed)
 Discharge Progress Report  Patient Details  Name: Erika Wu MRN: [id number] Date of Birth: October 25, 1945 Referring Provider:   Flowsheet Row INTENSIVE CARDIAC REHAB ORIENT from 12/02/2023 in [facility]  Referring Provider Peter [place], MD     Number of Visits: 57  Reason for Discharge:  Patient reached a stable level of exercise. Patient independent in their exercise. Patient has met program and personal goals.  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  ADL UCSD:   Initial Exercise Prescription:  Initial Exercise Prescription - 12/02/23 1500       Date of Initial Exercise RX and Referring Provider   Date 12/02/23    Referring Provider Peter [place], MD    Expected Discharge Date 02/26/24      NuStep   Level 1    SPM 70    Minutes 15    METs 2      Track   Laps 15    Minutes 15    METs 2      Prescription Details   Frequency (times per week) 3    Duration Progress to 30 minutes of continuous aerobic without signs/symptoms of physical distress      Intensity   THRR 40-80% of Max Heartrate 56-115    Ratings of Perceived Exertion 11-13    Perceived Dyspnea 0-4      Progression   Progression Continue progressive overload as per policy without signs/symptoms or physical distress.      Resistance Training   Training Prescription Yes    Weight 2    Reps 10-15          Discharge Exercise Prescription (Final Exercise Prescription Changes):  Exercise Prescription Changes - 02/26/24 1627       Response to Exercise   Blood Pressure (Admit) 96/60    Blood Pressure (Exercise) 120/64    Blood Pressure (Exit) 94/68    Heart Rate (Admit) 64 bpm    Heart Rate (Exercise) 93 bpm    Heart Rate  (Exit) 61 bpm    Rating of Perceived Exertion (Exercise) 13    Perceived Dyspnea (Exercise) 1    Symptoms 0    Comments Pt graduated teh [facility] program    Duration Progress to 30 minutes of  aerobic without signs/symptoms of physical distress    Intensity THRR unchanged      Progression   Progression Continue to progress workloads to maintain intensity without signs/symptoms of physical distress.    Average METs 3.29      Resistance Training   Training Prescription No    Weight 2    Reps 10-15    Time 10 Minutes      NuStep   Level 3    SPM 99    Minutes 15    METs 2.5      Track   Laps --   Post 2168ft   Minutes 6    METs 4.08      Home Exercise Plan   Plans to continue exercise at Home (comment)    Frequency Add 2 additional days to program exercise sessions.    Initial Home Exercises Provided 12/25/23          Functional  Capacity:  6 Minute Walk     Row Name 12/02/23 1541 02/26/24 1641       6 Minute Walk   Phase Initial Discharge    Distance 1200 feet 2125 feet    Distance % Change -- 77.08 %    Distance Feet Change -- 925 ft    Walk Time 6 minutes 6 minutes    # of Rest Breaks 0 0    MPH 2.27 4.02    METS 2.5 4.06    RPE 13 13    Perceived Dyspnea  0 0    VO2 Peak 8.76 14.2    Symptoms No No    Resting HR 82 bpm 64 bpm    Resting BP 106/70 96/60    Resting Oxygen Saturation  99 % --    Exercise Oxygen Saturation  during 6 min walk 99 % --    Max Ex. HR 93 bpm 93 bpm    Max Ex. BP 122/70 120/64    2 Minute Post BP 108/68 --       Psychological, QOL, Others - Outcomes: PHQ 2/9:    02/26/2024    4:10 PM 12/02/2023    2:25 PM  Depression screen PHQ 2/9  Decreased Interest 1 2  Down, Depressed, Hopeless 1 1  PHQ - 2 Score 2 3  Altered sleeping 0 3  Tired, decreased energy 1 2  Change in appetite 0 2  Feeling bad or failure about yourself  0 1  Trouble concentrating 1 1  Moving slowly or fidgety/restless 0 0  Suicidal thoughts  0 0  PHQ-9 Score 4 12  Difficult doing work/chores Somewhat difficult Somewhat difficult    Quality of Life:  Quality of Life - 02/26/24 1405       Quality of Life   Select Quality of Life      Quality of Life Scores   Health/Function Post 25.29 %    Socioeconomic Post 28.07 %    Psych/Spiritual Post 22.71 %    Family Post 30 %    GLOBAL Post 26.05 %          Personal Goals: Goals established at orientation with interventions provided to work toward goal.  Personal Goals and Risk Factors at Admission - 12/02/23 1432       Core Components/Risk Factors/Patient Goals on Admission    Weight Management Yes    Intervention Weight Management: Develop a combined nutrition and exercise program designed to reach desired caloric intake, while maintaining appropriate intake of nutrient and fiber, sodium and fats, and appropriate energy expenditure required for the weight goal.;Weight Management: Provide education and appropriate resources to help participant work on and attain dietary goals.    Expected Outcomes Short Term: Continue to assess and modify interventions until short term weight is achieved;Long Term: Adherence to nutrition and physical activity/exercise program aimed toward attainment of established weight goal;Understanding recommendations for meals to include 15-35% energy as protein, 25-35% energy from fat, 35-60% energy from carbohydrates, less than 200mg  of dietary cholesterol, 20-35 gm of total fiber daily;Understanding of distribution of calorie intake throughout the day with the consumption of 4-5 meals/snacks    Hypertension Yes    Intervention Provide education on lifestyle modifcations including regular physical activity/exercise, weight management, moderate sodium restriction and increased consumption of fresh fruit, vegetables, and low fat dairy, alcohol moderation, and smoking cessation.;Monitor prescription use compliance.    Expected Outcomes Short Term: Continued  assessment and intervention until BP is < 140/93mm  HG in hypertensive participants. < 130/63mm HG in hypertensive participants with diabetes, heart failure or chronic kidney disease.;Long Term: Maintenance of blood pressure at goal levels.    Lipids Yes    Intervention Provide education and support for participant on nutrition & aerobic/resistive exercise along with prescribed medications to achieve LDL 70mg , HDL >40mg .    Expected Outcomes Short Term: Participant states understanding of desired cholesterol values and is compliant with medications prescribed. Participant is following exercise prescription and nutrition guidelines.;Long Term: Cholesterol controlled with medications as prescribed, with individualized exercise RX and with personalized nutrition plan. Value goals: LDL < 70mg , HDL > 40 mg.    Stress Yes    Intervention Offer individual and/or small group education and counseling on adjustment to heart disease, stress management and health-related lifestyle change. Teach and support self-help strategies.;Refer participants experiencing significant psychosocial distress to appropriate mental health specialists for further evaluation and treatment. When possible, include family members and significant others in education/counseling sessions.    Expected Outcomes Short Term: Participant demonstrates changes in health-related behavior, relaxation and other stress management skills, ability to obtain effective social support, and compliance with psychotropic medications if prescribed.;Long Term: Emotional wellbeing is indicated by absence of clinically significant psychosocial distress or social isolation.           Personal Goals Discharge:  Goals and Risk Factor Review     Row Name 01/01/24 9157 01/29/24 1112 02/26/24 1011         Core Components/Risk Factors/Patient Goals Review   Personal Goals Review Weight Management/Obesity;Hypertension;Lipids;Stress Weight  Management/Obesity;Hypertension;Lipids;Stress Weight Management/Obesity;Hypertension;Lipids;Stress     Review Erika Wu has been doing well with exercise at cardiac rehab. Vital signs have been stable. Erika Wu has gained 3 kg since starting the program Erika Wu continues to do  well with exercise at cardiac rehab. Vital signs remain stable. Erika Wu has gained 2.2 kg since starting the program Erika Wu continues to do  well with exercise at cardiac rehab. Vital signs remain stable. Erika Wu has gained 2.3 kg since starting the program. Erika Wu has increased her workloads     Expected Outcomes Erika Wu will continue to participate in cardiac rehab for exercise nutrtion and lifestyle modifications Erika Wu will continue to participate in cardiac rehab for exercise nutrtion and lifestyle modifications Erika Wu will continue to participate in cardiac rehab for exercise nutrtion and lifestyle modifications        Exercise Goals and Review:  Exercise Goals     Row Name 12/02/23 1316             Exercise Goals   Increase Physical Activity Yes       Intervention Provide advice, education, support and counseling about physical activity/exercise needs.;Develop an individualized exercise prescription for aerobic and resistive training based on initial evaluation findings, risk stratification, comorbidities and participant's personal goals.       Expected Outcomes Short Term: Attend rehab on a regular basis to increase amount of physical activity.;Long Term: Exercising regularly at least 3-5 days a week.;Long Term: Add in home exercise to make exercise part of routine and to increase amount of physical activity.       Increase Strength and Stamina Yes       Intervention Provide advice, education, support and counseling about physical activity/exercise needs.;Develop an individualized exercise prescription for aerobic and resistive training based on initial evaluation findings, risk stratification, comorbidities and participant's  personal goals.       Expected Outcomes Short Term: Perform resistance training exercises routinely during rehab and add in  resistance training at home;Short Term: Increase workloads from initial exercise prescription for resistance, speed, and METs.;Long Term: Improve cardiorespiratory fitness, muscular endurance and strength as measured by increased METs and functional capacity ( )       Able to understand and use rate of perceived exertion (RPE) scale Yes       Intervention Provide education and explanation on how to use RPE scale       Expected Outcomes Short Term: Able to use RPE daily in rehab to express subjective intensity level;Long Term:  Able to use RPE to guide intensity level when exercising independently       Knowledge and understanding of Target Heart Rate Range (THRR) Yes       Intervention Provide education and explanation of THRR including how the numbers were predicted and where they are located for reference       Expected Outcomes Short Term: Able to state/look up THRR;Short Term: Able to use daily as guideline for intensity in rehab;Long Term: Able to use THRR to govern intensity when exercising independently       Understanding of Exercise Prescription Yes       Intervention Provide education, explanation, and written materials on patient's individual exercise prescription       Expected Outcomes Short Term: Able to explain program exercise prescription;Long Term: Able to explain home exercise prescription to exercise independently          Exercise Goals Re-Evaluation:  Exercise Goals Re-Evaluation     Row Name 12/09/23 1627 12/25/23 1632 01/29/24 1631 02/26/24 1631       Exercise Goal Re-Evaluation   Exercise Goals Review Increase Physical Activity;Understanding of Exercise Prescription;Increase Strength and Stamina;Knowledge and understanding of Target Heart Rate Range (THRR);Able to understand and use rate of perceived exertion (RPE) scale Increase Physical  Activity;Understanding of Exercise Prescription;Increase Strength and Stamina;Knowledge and understanding of Target Heart Rate Range (THRR);Able to understand and use rate of perceived exertion (RPE) scale Increase Physical Activity;Understanding of Exercise Prescription;Increase Strength and Stamina;Knowledge and understanding of Target Heart Rate Range (THRR);Able to understand and use rate of perceived exertion (RPE) scale Increase Physical Activity;Understanding of Exercise Prescription;Increase Strength and Stamina;Knowledge and understanding of Target Heart Rate Range (THRR);Able to understand and use rate of perceived exertion (RPE) scale    Comments Pt tolerated exercise well with and average MET level of 2.02. This is an estimate becasue MET's were not reported. Pt is off to a good start and is learning her THRR, RPE and ExRx Reviewed MET's, goals and home ExRx. Pt tolerated exercise well with and average MET level of 2.57. She is doing well and progressing MET's and laps on the track. She is very happt with her progress and is gaining stamina and the ability to walk more. She will continue to exercise on her own by walking, doing aerobic workout videos, stretches and using an exercise ball for 15-30 mins 1-2 days Reviewed MET's and goals. Pt tolerated exercise well with and average MET level of 3.07. She is doing well and progressing MET's and laps on the track. She feels good with her progress so far and feels an increase and stamina. She also felt good on her vacation to see her family. Pt graduated the [employer]. Pt tolerated exercise well with and average MET level of 3.29. Pt will continue to exercise by walking, doing workout videos and using her exercise ball 5-7 days 30-39mins. She did very well on her post walk test and almost doubled her  distance. Her original was 1261ft and her post was 2149ft, she increased by 975ft    Expected Outcomes Will continue to monitor pt and progress  workloads as tolerated without sign or symptom Will continue to monitor pt and progress workloads as tolerated without sign or symptom Will continue to monitor pt and progress workloads as tolerated without sign or symptom Will continue to monitor pt and progress workloads as tolerated without sign or symptom       Nutrition & Weight - Outcomes:  Pre Biometrics - 12/02/23 1315       Pre Biometrics   Waist Circumference 33 inches    Hip Circumference 34 inches    Waist to Hip Ratio 0.97 %    Triceps Skinfold 10 mm    % Body Fat 30.5 %    Grip Strength 16 kg    Flexibility 15 in    Single Leg Stand 17.4 seconds          Post Biometrics - 02/26/24 1637        Post  Biometrics   Height 5' 3 (1.6 m)    Weight 57 kg    Waist Circumference 32.5 inches    Hip Circumference 38 inches    Waist to Hip Ratio 0.86 %    BMI (Calculated) 22.27    Triceps Skinfold 13 mm    % Body Fat 32.1 %    Grip Strength 15 kg    Flexibility 15.5 in    Single Leg Stand 5.5 seconds          Nutrition:  Nutrition Therapy & Goals - 02/26/24 1559       Nutrition Therapy   Diet Heart Healthy Diet    Drug/Food Interactions Statins/Certain Fruits      Personal Nutrition Goals   Nutrition Goal Patient to identify strategies for reducing cardiovascular risk by attending the [facility] education and nutrition series weekly.   goal in progress.   Personal Goal #2 Patient to improve diet quality by using the plate method as a guide for meal planning to include lean protein/plant protein, fruits, vegetables, whole grains, nonfat dairy as part of a well-balanced diet.   goal in progress.   Comments Goals in progress. Ayeisha has medical history of AV replacement, HTN, CAD, hyperlipidemia. She has attended the [facility] education/nutrition series regularly. LDL is well controlled. She reports no nutrition concerns at this time. She continues regular follow-up with GI. She is up 4.8# since starting with our  program; BMI remains appropriate for age. Patient will benefit from adherence to nutrition, exercise, and lifestyle modification.      Intervention Plan   Intervention Prescribe, educate and counsel regarding individualized specific dietary modifications aiming towards targeted core components such as weight, hypertension, lipid management, diabetes, heart failure and other comorbidities.;Nutrition handout(s) given to patient.    Expected Outcomes Short Term Goal: Understand basic principles of dietary content, such as calories, fat, sodium, cholesterol and nutrients.;Long Term Goal: Adherence to prescribed nutrition plan.          Nutrition Discharge:  Nutrition Assessments - 02/26/24 1409       Rate Your Plate Scores   Post Score 46          Education Questionnaire Score:  Knowledge Questionnaire Score - 02/26/24 1411       Knowledge Questionnaire Score   Post Score 21/24          Goals reviewed with patient; copy given to patient.Pt graduates from  Intensive/Traditional  cardiac rehab program today with completion of  51 exercise and education sessions. Pt maintained good attendance and progressed nicely during their participation in rehab as evidenced by increased MET level. Erika Wu increased her distance on her post exercise walk test by 925 feet.   Medication list reconciled. Repeat  PHQ score- 4 .  Pt has made significant lifestyle changes and should be commended for their success. Erika Wu achieved her goals during cardiac rehab.   Pt plans to continue exercise walking for 30 minutes 5 days a week and dancing. Erika Wu says that participating in cardiac rehab has been very helpful. We are proud of Erika Wu's progress!Hadassah Elpidio Quan RN BSN

## 2024-02-26 NOTE — Progress Notes (Signed)
 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 (Patient taking differently: Take 325 mg by mouth daily. Patient reports only taking 81mg ), Disp: 100 tablet, Rfl: 3   Bacillus Coagulans-Inulin (PROBIOTIC-PREBIOTIC PO), Take 1 tablet by mouth daily., Disp: , Rfl:    buPROPion  (WELLBUTRIN  XL) 150 MG 24 hr tablet, Take 300 mg by mouth every morning., Disp: , Rfl: 3   carvedilol  (COREG ) 12.5 MG tablet, Take 1 tablet (12.5 mg total) by mouth 2 (two) times daily with a meal., Disp: 180 tablet, Rfl: 3   mirtazapine  (REMERON ) 45 MG tablet, Take 45 mg by mouth at bedtime., Disp: , Rfl:    pantoprazole  (PROTONIX ) 40 MG tablet, TAKE 1 TABLET(40 MG) BY MOUTH TWICE DAILY BEFORE A MEAL, Disp: 180 tablet, Rfl: 3   rosuvastatin  (CRESTOR ) 10 MG tablet, Take 10 mg by mouth daily., Disp: , Rfl:    Suvorexant  (BELSOMRA ) 20 MG TABS, Take 20 mg by mouth at bedtime., Disp: , Rfl:    temazepam (RESTORIL) 15 MG capsule, Take 15 mg by mouth at bedtime as needed for sleep., Disp: , Rfl:   Past Medical History: Past Medical History:  Diagnosis Date   Allergy    Anxiety     Arthritis    Chest pain    a. 03/2015 Lexiscan  MV: EF 59%, no ischemia/infarct.   Colon polyps    COVID-19 virus infection    09-22-2020, 10/2022 fully vax'd   Depression    Family history of adverse reaction to anesthesia    daughter has PONV   GERD (gastroesophageal reflux disease)    Heart murmur    Hemorrhoids    Hyperlipidemia    Hypertension    Moderate aortic insufficiency    a. 03/2015 Echo: EF 55-60%, no rwma, mild LVH, Gr 1 DD, mild AS, mod AI (bicuspid AoV by report, poorly visualized on this study), Asc Ao diam 44mm, triv MR.    Tobacco Use: Social History   Tobacco Use  Smoking Status Former   Current packs/day: 0.50   Average packs/day: 0.5 packs/day for 15.0 years (7.5 ttl pk-yrs)   Types: Cigarettes  Smokeless Tobacco Never    Labs: Review Flowsheet       Latest Ref Rng & Units 07/19/2016 10/03/2023 10/28/2023 10/30/2023  Labs for ITP Cardiac and Pulmonary Rehab  Cholestrol <200 mg/dL 782  - - -  LDL (calc) mg/dL 89  - - -  HDL-C >95 mg/dL 86  - - -  Trlycerides <150 mg/dL 77  - - -  Hemoglobin A2Z 4.8 - 5.6 % - - 5.2  -  PH, Arterial 7.35 - 7.45 - 7.367  -  7.332  7.260  7.322  7.397  7.323  7.316   PCO2 arterial 32 - 48 mmHg - 35.8  - 37.4  43.0  41.2  36.4  39.6  37.0   Bicarbonate 20.0 - 28.0 mmol/L - 21.6  21.6  20.5  - 19.9  19.5  21.3  22.4  21.3  20.5  18.9   TCO2 22 - 32 mmol/L - 23  23  22   - 21  21  23  24  26  23  26  23  22  21  20  22    Acid-base deficit 0.0 - 2.0 mmol/L - 4.0  4.0  4.0  - 6.0  7.0  4.0  2.0  4.0  5.0  7.0   O2 Saturation % - 57  63  92  - 95  93  100  100  70  100  100     Details       Multiple values from one day are sorted in reverse-chronological order         Capillary Blood Glucose: Lab Results  Component Value Date   GLUCAP 147 (H) 11/03/2023   GLUCAP 88 11/03/2023   GLUCAP 131 (H) 11/02/2023   GLUCAP 106 (H) 11/02/2023   GLUCAP 123 (H) 11/02/2023     Exercise Target Goals: Exercise Program  Goal: Individual exercise prescription set using results from initial 6 min walk test and THRR while considering  patient's activity barriers and safety.   Exercise Prescription Goal: Initial exercise prescription builds to 30-45 minutes a day of aerobic activity, 2-3 days per week.  Home exercise guidelines will be given to patient during program as part of exercise prescription that the participant will acknowledge.  Activity Barriers & Risk Stratification:  Activity Barriers & Cardiac Risk Stratification - 12/02/23 1432       Activity Barriers & Cardiac Risk Stratification   Activity Barriers Neck/Spine Problems;Balance Concerns;Deconditioning;Other (comment)    Comments sternal precautions    Cardiac Risk Stratification High   <5 METs on         6 Minute Walk:  6 Minute Walk     Row Name 12/02/23 1541         6 Minute Walk   Phase Initial     Distance 1200 feet     Walk Time 6 minutes     # of Rest Breaks 0     MPH 2.27     METS 2.5     RPE 13     Perceived Dyspnea  0     VO2 Peak 8.76     Symptoms No     Resting HR 82 bpm     Resting BP 106/70     Resting Oxygen Saturation  99 %     Exercise Oxygen Saturation  during 6 min walk 99 %     Max Ex. HR 93 bpm     Max Ex. BP 122/70     2 Minute Post BP 108/68        Oxygen Initial Assessment:   Oxygen Re-Evaluation:   Oxygen Discharge (Final Oxygen Re-Evaluation):   Initial Exercise Prescription:  Initial Exercise Prescription - 12/02/23 1500       Date of Initial Exercise RX and Referring Provider   Date 12/02/23    Referring Provider Peter [place], MD    Expected Discharge Date 02/26/24      NuStep   Level 1    SPM 70  Minutes 15    METs 2      Track   Laps 15    Minutes 15    METs 2      Prescription Details   Frequency (times per week) 3    Duration Progress to 30 minutes of continuous aerobic without signs/symptoms of physical distress      Intensity   THRR 40-80% of Max  Heartrate 56-115    Ratings of Perceived Exertion 11-13    Perceived Dyspnea 0-4      Progression   Progression Continue progressive overload as per policy without signs/symptoms or physical distress.      Resistance Training   Training Prescription Yes    Weight 2    Reps 10-15          Perform Capillary Blood Glucose checks as needed.  Exercise Prescription Changes:   Exercise Prescription Changes     Row Name 12/09/23 1623 12/25/23 1628 01/06/24 1655 01/29/24 1629 02/17/24 1600     Response to Exercise   Blood Pressure (Admit) 102/60 108/68 110/60 110/60 96/50   Blood Pressure (Exercise) 110/68 102/58 -- -- --   Blood Pressure (Exit) 102/60 108/70 102/60 102/60 98/60   Heart Rate (Admit) 85 bpm 84 bpm 73 bpm 79 bpm 61 bpm   Heart Rate (Exercise) 90 bpm 108 bpm 118 bpm 100 bpm 93 bpm   Heart Rate (Exit) 85 bpm 85 bpm 79 bpm 85 bpm 66 bpm   Rating of Perceived Exertion (Exercise) 11 12.5 13 13 13    Perceived Dyspnea (Exercise) 0 0 0 0 0   Symptoms 0 0 0 0 0   Comments Pt first day in the [employer] program Reviewed MET's, goals and home ExRx Reviewed MET's Reviewed MET's and goals Reviewed METs   Duration Progress to 30 minutes of  aerobic without signs/symptoms of physical distress Progress to 30 minutes of  aerobic without signs/symptoms of physical distress Progress to 30 minutes of  aerobic without signs/symptoms of physical distress Progress to 30 minutes of  aerobic without signs/symptoms of physical distress Progress to 30 minutes of  aerobic without signs/symptoms of physical distress   Intensity THRR unchanged THRR unchanged THRR unchanged THRR unchanged THRR unchanged     Progression   Progression Continue to progress workloads to maintain intensity without signs/symptoms of physical distress. Continue to progress workloads to maintain intensity without signs/symptoms of physical distress. Continue to progress workloads to maintain intensity without signs/symptoms  of physical distress. Continue to progress workloads to maintain intensity without signs/symptoms of physical distress. Continue to progress workloads to maintain intensity without signs/symptoms of physical distress.   Average METs 2.02  EST. Pt did not report laps or MET's 2.02 2.84 3.07 2.68     Resistance Training   Training Prescription Yes Yes Yes No Yes   Weight 2 2 2 2 2    Reps 10-15 10-15 10-15 10-15 10-15   Time 10 Minutes 10 Minutes 10 Minutes 10 Minutes 10 Minutes     NuStep   Level 1 1 2 2 3    SPM -- 99 118 137 74   Minutes 15 15 15 15 15    METs --  Did not report MET's 2.1 2.5 2.7 1.8     Track   Laps 8 16 17 19 20    Minutes 15 15 15 15 15    METs --  Did not report laps. Lost count 3.03 3.17 3.43 3.55     Home Exercise Plan  Plans to continue exercise at -- Home (comment) Home (comment) Home (comment) Home (comment)   Frequency -- Add 2 additional days to program exercise sessions. Add 2 additional days to program exercise sessions. Add 2 additional days to program exercise sessions. Add 2 additional days to program exercise sessions.   Initial Home Exercises Provided -- 12/25/23 12/25/23 12/25/23 12/25/23      Exercise Comments:   Exercise Comments     Row Name 12/09/23 1629 12/25/23 1635 01/06/24 1656 01/24/24 1711 01/29/24 1633   Exercise Comments Pt tolerated exercise well with and average MET level of 2.02. This is an estimate becasue MET's were not reported. Pt is off to a good start and is learning her THRR, RPE and ExRx. Reviewed MET's, goals and home ExRx. Pt tolerated exercise well with and average MET level of 2.57. She is doing well and progressing MET's and laps on the track. She is very happt with her progress and is gaining stamina and the ability to walk more. She will continue to exercise on her own by walking, doing aerobic workout videos, stretches and using an exercise ball for 15-30 mins 1-2 days Reviewed MET's. Pt tolerated exercise well with and  average MET level of 2.57. Pt is doing well and progressing MET's. Patient has been absent, will review education when she returns, may be visiting family at the beach Reviewed MET's and goals. Pt tolerated exercise well with and average MET level of 3.07. She is doing well and progressing MET's and laps on the track. She feels good with her progress so far and feels an increase and stamina. She also felt good on her vacation to see her family.    Row Name 02/17/24 1628           Exercise Comments Reviewed METs today with pt. She is averaging around 2.68 METs and tolerating well without s/sx. Pt has gradually increased number of laps on track and was able to increase workload on Nustep today. Discussed with pt taking longer strides while on nustep which resulted in decreased spm, however pt stated it felt better. Will continue to progress workloads as tolerated.          Exercise Goals and Review:   Exercise Goals     Row Name 12/02/23 1316             Exercise Goals   Increase Physical Activity Yes       Intervention Provide advice, education, support and counseling about physical activity/exercise needs.;Develop an individualized exercise prescription for aerobic and resistive training based on initial evaluation findings, risk stratification, comorbidities and participant's personal goals.       Expected Outcomes Short Term: Attend rehab on a regular basis to increase amount of physical activity.;Long Term: Exercising regularly at least 3-5 days a week.;Long Term: Add in home exercise to make exercise part of routine and to increase amount of physical activity.       Increase Strength and Stamina Yes       Intervention Provide advice, education, support and counseling about physical activity/exercise needs.;Develop an individualized exercise prescription for aerobic and resistive training based on initial evaluation findings, risk stratification, comorbidities and participant's personal  goals.       Expected Outcomes Short Term: Perform resistance training exercises routinely during rehab and add in resistance training at home;Short Term: Increase workloads from initial exercise prescription for resistance, speed, and METs.;Long Term: Improve cardiorespiratory fitness, muscular endurance and strength as measured by increased METs and  functional capacity ( )       Able to understand and use rate of perceived exertion (RPE) scale Yes       Intervention Provide education and explanation on how to use RPE scale       Expected Outcomes Short Term: Able to use RPE daily in rehab to express subjective intensity level;Long Term:  Able to use RPE to guide intensity level when exercising independently       Knowledge and understanding of Target Heart Rate Range (THRR) Yes       Intervention Provide education and explanation of THRR including how the numbers were predicted and where they are located for reference       Expected Outcomes Short Term: Able to state/look up THRR;Short Term: Able to use daily as guideline for intensity in rehab;Long Term: Able to use THRR to govern intensity when exercising independently       Understanding of Exercise Prescription Yes       Intervention Provide education, explanation, and written materials on patient's individual exercise prescription       Expected Outcomes Short Term: Able to explain program exercise prescription;Long Term: Able to explain home exercise prescription to exercise independently          Exercise Goals Re-Evaluation :  Exercise Goals Re-Evaluation     Row Name 12/09/23 1627 12/25/23 1632 01/29/24 1631         Exercise Goal Re-Evaluation   Exercise Goals Review Increase Physical Activity;Understanding of Exercise Prescription;Increase Strength and Stamina;Knowledge and understanding of Target Heart Rate Range (THRR);Able to understand and use rate of perceived exertion (RPE) scale Increase Physical Activity;Understanding of  Exercise Prescription;Increase Strength and Stamina;Knowledge and understanding of Target Heart Rate Range (THRR);Able to understand and use rate of perceived exertion (RPE) scale Increase Physical Activity;Understanding of Exercise Prescription;Increase Strength and Stamina;Knowledge and understanding of Target Heart Rate Range (THRR);Able to understand and use rate of perceived exertion (RPE) scale     Comments Pt tolerated exercise well with and average MET level of 2.02. This is an estimate becasue MET's were not reported. Pt is off to a good start and is learning her THRR, RPE and ExRx Reviewed MET's, goals and home ExRx. Pt tolerated exercise well with and average MET level of 2.57. She is doing well and progressing MET's and laps on the track. She is very happt with her progress and is gaining stamina and the ability to walk more. She will continue to exercise on her own by walking, doing aerobic workout videos, stretches and using an exercise ball for 15-30 mins 1-2 days Reviewed MET's and goals. Pt tolerated exercise well with and average MET level of 3.07. She is doing well and progressing MET's and laps on the track. She feels good with her progress so far and feels an increase and stamina. She also felt good on her vacation to see her family.     Expected Outcomes Will continue to monitor pt and progress workloads as tolerated without sign or symptom Will continue to monitor pt and progress workloads as tolerated without sign or symptom Will continue to monitor pt and progress workloads as tolerated without sign or symptom        Discharge Exercise Prescription (Final Exercise Prescription Changes):  Exercise Prescription Changes - 02/17/24 1600       Response to Exercise   Blood Pressure (Admit) 96/50    Blood Pressure (Exit) 98/60    Heart Rate (Admit) 61 bpm    Heart  Rate (Exercise) 93 bpm    Heart Rate (Exit) 66 bpm    Rating of Perceived Exertion (Exercise) 13    Perceived Dyspnea  (Exercise) 0    Symptoms 0    Comments Reviewed METs    Duration Progress to 30 minutes of  aerobic without signs/symptoms of physical distress    Intensity THRR unchanged      Progression   Progression Continue to progress workloads to maintain intensity without signs/symptoms of physical distress.    Average METs 2.68      Resistance Training   Training Prescription Yes    Weight 2    Reps 10-15    Time 10 Minutes      NuStep   Level 3    SPM 74    Minutes 15    METs 1.8      Track   Laps 20    Minutes 15    METs 3.55      Home Exercise Plan   Plans to continue exercise at Home (comment)    Frequency Add 2 additional days to program exercise sessions.    Initial Home Exercises Provided 12/25/23          Nutrition:  Target Goals: Understanding of nutrition guidelines, daily intake of sodium 1500mg , cholesterol 200mg , calories 30% from fat and 7% or less from saturated fats, daily to have 5 or more servings of fruits and vegetables.  Biometrics:  Pre Biometrics - 12/02/23 1315       Pre Biometrics   Waist Circumference 33 inches    Hip Circumference 34 inches    Waist to Hip Ratio 0.97 %    Triceps Skinfold 10 mm    % Body Fat 30.5 %    Grip Strength 16 kg    Flexibility 15 in    Single Leg Stand 17.4 seconds           Nutrition Therapy Plan and Nutrition Goals:  Nutrition Therapy & Goals - 02/14/24 1438       Nutrition Therapy   Diet Heart Healthy Diet    Drug/Food Interactions Statins/Certain Fruits      Personal Nutrition Goals   Nutrition Goal Patient to identify strategies for reducing cardiovascular risk by attending the [facility] education and nutrition series weekly.   goal in progress.   Personal Goal #2 Patient to improve diet quality by using the plate method as a guide for meal planning to include lean protein/plant protein, fruits, vegetables, whole grains, nonfat dairy as part of a well-balanced diet.   goal in progress.   Comments  Goals in progress. Shyvonne has medical history of AV replacement, HTN, CAD, hyperlipidemia. She continues to attend the [facility] education/nutrition series regularly. LDL is well controlled. She reports no nutrition concerns at this time. She continues regular follow-up with GI. She is up 4.8# since starting with our program; BMI remains appropriate for age. Patient will benefit from participation in intensive cardiac rehab for nutrition, exercise, and lifestyle modification.      Intervention Plan   Intervention Prescribe, educate and counsel regarding individualized specific dietary modifications aiming towards targeted core components such as weight, hypertension, lipid management, diabetes, heart failure and other comorbidities.;Nutrition handout(s) given to patient.    Expected Outcomes Short Term Goal: Understand basic principles of dietary content, such as calories, fat, sodium, cholesterol and nutrients.;Long Term Goal: Adherence to prescribed nutrition plan.          Nutrition Assessments:  MEDIFICTS Score Key: >=70 Need  to make dietary changes  40-70 Heart Healthy Diet <= 40 Therapeutic Level Cholesterol Diet    Picture Your Plate Scores: <78 Unhealthy dietary pattern with much room for improvement. 41-50 Dietary pattern unlikely to meet recommendations for good health and room for improvement. 51-60 More healthful dietary pattern, with some room for improvement.  >60 Healthy dietary pattern, although there may be some specific behaviors that could be improved.    Nutrition Goals Re-Evaluation:  Nutrition Goals Re-Evaluation     Row Name 12/09/23 1541 01/10/24 1442 02/14/24 1438         Goals   Current Weight 120 lb 13 oz (54.8 kg) 125 lb 10.6 oz (57 kg) 125 lb 10.6 oz (57 kg)     Comment A1c WNL, LDL 53 A1c WNL, LDL 53 A1c WNL, LDL 53     Expected Outcome Oreatha has medical history of AV replacement, HTN, CAD, hyperlipidemia. LDL is well controlled. She reports no nutrition  concerns at this time. She continues regular follow-up with GI. Patient will benefit from participation in intensive cardiac rehab for nutrition, exercise, and lifestyle modification. Goals in progress. Azalea has medical history of AV replacement, HTN, CAD, hyperlipidemia. LDL is well controlled. She reports no nutrition concerns at this time. She continues regular follow-up with GI. She is up 4.8# since starting with our program; BMI remains appropriate for age. Patient will benefit from participation in intensive cardiac rehab for nutrition, exercise, and lifestyle modification. Goals in progress. Sheretta has medical history of AV replacement, HTN, CAD, hyperlipidemia. She continues to attend the [facility] education/nutrition series regularly. LDL is well controlled. She reports no nutrition concerns at this time. She continues regular follow-up with GI. She is up 4.8# since starting with our program; BMI remains appropriate for age. Patient will benefit from participation in intensive cardiac rehab for nutrition, exercise, and lifestyle modification.        Nutrition Goals Re-Evaluation:  Nutrition Goals Re-Evaluation     Row Name 12/09/23 1541 01/10/24 1442 02/14/24 1438         Goals   Current Weight 120 lb 13 oz (54.8 kg) 125 lb 10.6 oz (57 kg) 125 lb 10.6 oz (57 kg)     Comment A1c WNL, LDL 53 A1c WNL, LDL 53 A1c WNL, LDL 53     Expected Outcome Shamyia has medical history of AV replacement, HTN, CAD, hyperlipidemia. LDL is well controlled. She reports no nutrition concerns at this time. She continues regular follow-up with GI. Patient will benefit from participation in intensive cardiac rehab for nutrition, exercise, and lifestyle modification. Goals in progress. Senora has medical history of AV replacement, HTN, CAD, hyperlipidemia. LDL is well controlled. She reports no nutrition concerns at this time. She continues regular follow-up with GI. She is up 4.8# since starting with our program; BMI  remains appropriate for age. Patient will benefit from participation in intensive cardiac rehab for nutrition, exercise, and lifestyle modification. Goals in progress. Channah has medical history of AV replacement, HTN, CAD, hyperlipidemia. She continues to attend the [facility] education/nutrition series regularly. LDL is well controlled. She reports no nutrition concerns at this time. She continues regular follow-up with GI. She is up 4.8# since starting with our program; BMI remains appropriate for age. Patient will benefit from participation in intensive cardiac rehab for nutrition, exercise, and lifestyle modification.        Nutrition Goals Discharge (Final Nutrition Goals Re-Evaluation):  Nutrition Goals Re-Evaluation - 02/14/24 1438  Goals   Current Weight 125 lb 10.6 oz (57 kg)    Comment A1c WNL, LDL 53    Expected Outcome Goals in progress. Kwana has medical history of AV replacement, HTN, CAD, hyperlipidemia. She continues to attend the [facility] education/nutrition series regularly. LDL is well controlled. She reports no nutrition concerns at this time. She continues regular follow-up with GI. She is up 4.8# since starting with our program; BMI remains appropriate for age. Patient will benefit from participation in intensive cardiac rehab for nutrition, exercise, and lifestyle modification.          Psychosocial: Target Goals: Acknowledge presence or absence of significant depression and/or stress, maximize coping skills, provide positive support system. Participant is able to verbalize types and ability to use techniques and skills needed for reducing stress and depression.  Initial Review & Psychosocial Screening:  Initial Psych Review & Screening - 12/02/23 1425       Initial Review   Current issues with Current Depression;Current Anxiety/Panic;Current Sleep Concerns;Current Stress Concerns    Source of Stress Concerns Chronic Illness    Comments Beata shared that she has  had some feelings of anxiety, depression, and stress since her surgery. She is taking medication which she says is helping, but she has been feeling down since her surgery. She has had trouble sleeping, stating that she has had nightmares and night sweating that sometimes requires her to change clothes 2-3x a night. She feels anxious at times because she lives alone is is worried something may happen. Support offered, Darleene denies any need for additional resources at this time.      Family Dynamics   Good Support System? Yes   Friends in town     Barriers   Psychosocial barriers to participate in program The patient should benefit from training in stress management and relaxation.      Screening Interventions   Interventions Encouraged to exercise;Provide feedback about the scores to participant;To provide support and resources with identified psychosocial needs    Expected Outcomes Long Term goal: The participant improves quality of Life and PHQ9 Scores as seen by post scores and/or verbalization of changes;Short Term goal: Identification and review with participant of any Quality of Life or Depression concerns found by scoring the questionnaire.;Long Term Goal: Stressors or current issues are controlled or eliminated.;Short Term goal: Utilizing psychosocial counselor, staff and physician to assist with identification of specific Stressors or current issues interfering with healing process. Setting desired goal for each stressor or current issue identified.          Quality of Life Scores:  Quality of Life - 12/02/23 1542       Quality of Life   Select Quality of Life      Quality of Life Scores   Health/Function Pre 22 %    Socioeconomic Pre 27.86 %    Psych/Spiritual Pre 20.86 %    Family Pre 26.4 %    GLOBAL Pre 23.62 %         Scores of 19 and below usually indicate a poorer quality of life in these areas.  A difference of  2-3 points is a clinically meaningful difference.  A  difference of 2-3 points in the total score of the Quality of Life Index has been associated with significant improvement in overall quality of life, self-image, physical symptoms, and general health in studies assessing change in quality of life.  PHQ-9: Review Flowsheet       12/02/2023  Depression screen  PHQ 2/9  Decreased Interest 2  Down, Depressed, Hopeless 1  PHQ - 2 Score 3  Altered sleeping 3  Tired, decreased energy 2  Change in appetite 2  Feeling bad or failure about yourself  1  Trouble concentrating 1  Moving slowly or fidgety/restless 0  Suicidal thoughts 0  PHQ-9 Score 12  Difficult doing work/chores Somewhat difficult   Interpretation of Total Score  Total Score Depression Severity:  1-4 = Minimal depression, 5-9 = Mild depression, 10-14 = Moderate depression, 15-19 = Moderately severe depression, 20-27 = Severe depression   Psychosocial Evaluation and Intervention:   Psychosocial Re-Evaluation:  Psychosocial Re-Evaluation     Row Name 01/01/24 0836 01/29/24 1108 02/26/24 1009         Psychosocial Re-Evaluation   Current issues with Current Depression;Current Anxiety/Panic;Current Sleep Concerns;Current Stress Concerns Current Depression;Current Anxiety/Panic;Current Sleep Concerns;Current Stress Concerns Current Depression;Current Anxiety/Panic;Current Sleep Concerns;Current Stress Concerns     Comments PH9 Reviewed. Su reports that she is not feeling as depressed. Nissa reports that she is sleeping better. Khya says that she will reach out for counseling if needed. Taeko has not voiced any increased concerns or stressors during exercise at cardiac rehab. Jamacia continues  not voiced any increased concerns or stressors during exercise at cardiac rehab. Dema will tenatively complete cardiac rehab on 02/26/24     Expected Outcomes Tykeshia will have controlled or decreased stress upon completion of cardiac rehab Candence will have controlled or decreased stress upon  completion of cardiac rehab Azaliah will have controlled or decreased stress upon completion of cardiac rehab     Interventions Stress management education;Encouraged to attend Cardiac Rehabilitation for the exercise;Relaxation education Stress management education;Encouraged to attend Cardiac Rehabilitation for the exercise;Relaxation education Stress management education;Encouraged to attend Cardiac Rehabilitation for the exercise;Relaxation education     Continue Psychosocial Services  Follow up required by staff No Follow up required No Follow up required       Initial Review   Source of Stress Concerns Chronic Illness Chronic Illness Chronic Illness     Comments Will continue to monitor and offer support as needed Will continue to monitor and offer support as needed Will continue to monitor and offer support as needed        Psychosocial Discharge (Final Psychosocial Re-Evaluation):  Psychosocial Re-Evaluation - 02/26/24 1009       Psychosocial Re-Evaluation   Current issues with Current Depression;Current Anxiety/Panic;Current Sleep Concerns;Current Stress Concerns    Comments Ermel continues  not voiced any increased concerns or stressors during exercise at cardiac rehab. Jamielyn will tenatively complete cardiac rehab on 02/26/24    Expected Outcomes Landry will have controlled or decreased stress upon completion of cardiac rehab    Interventions Stress management education;Encouraged to attend Cardiac Rehabilitation for the exercise;Relaxation education    Continue Psychosocial Services  No Follow up required      Initial Review   Source of Stress Concerns Chronic Illness    Comments Will continue to monitor and offer support as needed          Vocational Rehabilitation: Provide vocational rehab assistance to qualifying candidates.   Vocational Rehab Evaluation & Intervention:  Vocational Rehab - 12/02/23 1432       Initial Vocational Rehab Evaluation & Intervention    Assessment shows need for Vocational Rehabilitation No   Retired Runner, broadcasting/film/video         Education: Education Goals: Education classes will be provided on a weekly basis, covering required topics. Participant  will state understanding/return demonstration of topics presented.    Education     Row Name 12/09/23 1400     Education   Cardiac Education Topics Pritikin   Psychologist, forensic Exercise Education   Exercise Education Biomechanial Limitations   Instruction Review Code 1- Verbalizes Understanding   Class Start Time 1400   Class Stop Time 1440   Class Time Calculation (min) 40 min    Row Name 12/11/23 1600     Education   Cardiac Education Topics Pritikin   Customer service manager   Weekly Topic Fast Evening Meals   Instruction Review Code 1- Verbalizes Understanding   Class Start Time 1400   Class Stop Time 1440   Class Time Calculation (min) 40 min    Row Name 12/13/23 1400     Education   Cardiac Education Topics Pritikin   Licensed conveyancer Nutrition   Nutrition Vitamins and Minerals   Instruction Review Code 1- Verbalizes Understanding   Class Start Time 1400   Class Stop Time 1440   Class Time Calculation (min) 40 min    Row Name 12/16/23 1500     Education   Cardiac Education Topics Pritikin   Glass blower/designer Nutrition   Nutrition Workshop Fueling a Forensic psychologist   Instruction Review Code 1- Teaching laboratory technician Start Time 1400   Class Stop Time 1440   Class Time Calculation (min) 40 min    Row Name 12/18/23 1400     Education   Cardiac Education Topics Pritikin   Customer service manager   Weekly Topic International Cuisine- Spotlight on the [employer] Zones   Instruction Review Code 1- Verbalizes  Understanding   Class Start Time 1400   Class Stop Time 1440   Class Time Calculation (min) 40 min    Row Name 12/23/23 1700     Education   Cardiac Education Topics Pritikin   [employer]     Workshops   Educator Exercise Physiologist   Select Psychosocial   Psychosocial Workshop Healthy Sleep for a Healthy Heart   Instruction Review Code 1- Verbalizes Understanding   Class Start Time 1403   Class Stop Time 1457   Class Time Calculation (min) 54 min    Row Name 12/25/23 1400     Education   Cardiac Education Topics Pritikin   Customer service manager   Weekly Topic Simple Sides and Sauces   Instruction Review Code 1- Verbalizes Understanding   Class Start Time 1355   Class Stop Time 1431   Class Time Calculation (min) 36 min    Row Name 12/27/23 1500     Education   Cardiac Education Topics Pritikin   Nurse, children's Exercise Physiologist   Select Psychosocial   Psychosocial How Our Thoughts Can Heal Our Hearts   Instruction Review Code 1- Verbalizes Understanding   Class Start Time 1400   Class Stop Time 1440   Class Time Calculation (min) 40 min    Row Name 12/30/23 1500     Education  Cardiac Education Topics Pritikin   Geographical information systems officer Exercise   Exercise Workshop Managing Heart Disease: Your Path to a Healthier Heart   Instruction Review Code 1- Verbalizes Understanding   Class Start Time 1400   Class Stop Time 1448   Class Time Calculation (min) 48 min    Row Name 01/01/24 1500     Education   Cardiac Education Topics Pritikin   Customer service manager   Weekly Topic Powerhouse Plant-Based Proteins   Instruction Review Code 1- Verbalizes Understanding   Class Start Time 1400   Class Stop Time 1435   Class Time Calculation (min) 35 min    Row Name 01/06/24 1400      Education   Cardiac Education Topics Pritikin   Geographical information systems officer Psychosocial   Psychosocial Workshop From Head to Heart: The Power of a Healthy Outlook   Instruction Review Code 1- Verbalizes Understanding   Class Start Time 1400   Class Stop Time 1449   Class Time Calculation (min) 49 min    Row Name 01/08/24 1600     Education   Cardiac Education Topics Pritikin   Customer service manager   Weekly Topic Tasty Appetizers and Snacks   Instruction Review Code 1- Verbalizes Understanding   Class Start Time 1400   Class Stop Time 1445   Class Time Calculation (min) 45 min    Row Name 01/10/24 1400     Education   Cardiac Education Topics Pritikin   Hospital doctor Education   General Education Heart Disease Risk Reduction   Instruction Review Code 1- Verbalizes Understanding   Class Start Time 1400   Class Stop Time 1445   Class Time Calculation (min) 45 min    Row Name 01/13/24 1600     Education   Cardiac Education Topics Pritikin   Nurse, children's Exercise Physiologist   Select Psychosocial   Psychosocial Healthy Minds, Bodies, Hearts   Instruction Review Code 1- Verbalizes Understanding   Class Start Time 1410   Class Stop Time 1445   Class Time Calculation (min) 35 min    Row Name 01/27/24 1500     Education   Cardiac Education Topics Pritikin   Hospital doctor Education   General Education Metabolic Syndrome and Belly Fat   Instruction Review Code 1- Verbalizes Understanding   Class Start Time 1355   Class Stop Time 1440   Class Time Calculation (min) 45 min    Row Name 01/29/24 1500     Education   Cardiac Education Topics Pritikin   Orthoptist    Educator Dietitian   Weekly Topic Personalizing Your Pritikin Plate   Instruction Review Code 1- Verbalizes Understanding   Class Start Time 1355   Class Stop Time 1430   Class Time Calculation (min) 35 min    Row Name 01/31/24 1500     Education   Cardiac Education Topics Pritikin   Psychologist, sport and exercise  Core Videos   Educator Exercise Physiologist   Select Nutrition   Nutrition Overview of the Pritikin Eating Plan   Instruction Review Code 1- Verbalizes Understanding   Class Start Time 1350   Class Stop Time 1435   Class Time Calculation (min) 45 min    Row Name 02/05/24 1600     Education   Cardiac Education Topics Pritikin   Orthoptist   Educator Dietitian   Weekly Topic Delicious Desserts   Instruction Review Code 1- Verbalizes Understanding   Class Start Time 1400   Class Stop Time 1437   Class Time Calculation (min) 37 min    Row Name 02/07/24 1600     Education   Cardiac Education Topics Pritikin   Licensed conveyancer Nutrition   Nutrition Calorie Density   Instruction Review Code 1- Verbalizes Understanding   Class Start Time 1400   Class Stop Time 1440   Class Time Calculation (min) 40 min    Row Name 02/10/24 1600     Education   Cardiac Education Topics Pritikin   Geographical information systems officer Exercise   Exercise Workshop Exercise Basics: Diplomatic Services operational officer   Instruction Review Code 1- Verbalizes Understanding   Class Start Time 1400   Class Stop Time 1450   Class Time Calculation (min) 50 min    Row Name 02/17/24 1400     Education   Cardiac Education Topics Pritikin   Hospital doctor Education   General Education Hypertension and Heart Disease   Instruction Review Code 1- Verbalizes Understanding   Class Start Time 1355   Class Stop  Time 1443   Class Time Calculation (min) 48 min    Row Name 02/19/24 1500     Education   Cardiac Education Topics Pritikin   Customer service manager   Weekly Topic One-Pot Wonders   Instruction Review Code 1- Verbalizes Understanding   Class Start Time 1400   Class Stop Time 1445   Class Time Calculation (min) 45 min    Row Name 02/21/24 1500     Education   Cardiac Education Topics Pritikin   Glass blower/designer Nutrition   Nutrition Workshop Targeting Your Nutrition Priorities   Instruction Review Code 1- Verbalizes Understanding   Class Start Time 1400   Class Stop Time 1440   Class Time Calculation (min) 40 min      Core Videos: Exercise    Move It!  Clinical staff conducted group or individual video education with verbal and written material and guidebook.  Patient learns the recommended Pritikin exercise program. Exercise with the goal of living a long, healthy life. Some of the health benefits of exercise include controlled diabetes, healthier blood pressure levels, improved cholesterol levels, improved heart and lung capacity, improved sleep, and better body composition. Everyone should speak with their doctor before starting or changing an exercise routine.  Biomechanical Limitations Clinical staff conducted group or individual video education with verbal and written material and guidebook.  Patient learns how biomechanical limitations can impact exercise and how we can mitigate and possibly overcome limitations to have an  impactful and balanced exercise routine.  Body Composition Clinical staff conducted group or individual video education with verbal and written material and guidebook.  Patient learns that body composition (ratio of muscle mass to fat mass) is a key component to assessing overall fitness, rather than body weight alone. Increased fat mass, especially visceral belly  fat, can put us  at increased risk for metabolic syndrome, type 2 diabetes, heart disease, and even death. It is recommended to combine diet and exercise (cardiovascular and resistance training) to improve your body composition. Seek guidance from your physician and exercise physiologist before implementing an exercise routine.  Exercise Action Plan Clinical staff conducted group or individual video education with verbal and written material and guidebook.  Patient learns the recommended strategies to achieve and enjoy long-term exercise adherence, including variety, self-motivation, self-efficacy, and positive decision making. Benefits of exercise include fitness, good health, weight management, more energy, better sleep, less stress, and overall well-being.  Medical   Heart Disease Risk Reduction Clinical staff conducted group or individual video education with verbal and written material and guidebook.  Patient learns our heart is our most vital organ as it circulates oxygen, nutrients, white blood cells, and hormones throughout the entire body, and carries waste away. Data supports a plant-based eating plan like the Pritikin Program for its effectiveness in slowing progression of and reversing heart disease. The video provides a number of recommendations to address heart disease.   Metabolic Syndrome and Belly Fat  Clinical staff conducted group or individual video education with verbal and written material and guidebook.  Patient learns what metabolic syndrome is, how it leads to heart disease, and how one can reverse it and keep it from coming back. You have metabolic syndrome if you have 3 of the following 5 criteria: abdominal obesity, high blood pressure, high triglycerides, low HDL cholesterol, and high blood sugar.  Hypertension and Heart Disease Clinical staff conducted group or individual video education with verbal and written material and guidebook.  Patient learns that high blood  pressure, or hypertension, is very common in the [place] . Hypertension is largely due to excessive salt intake, but other important risk factors include being overweight, physical inactivity, drinking too much alcohol, smoking, and not eating enough potassium from fruits and vegetables. High blood pressure is a leading risk factor for heart attack, stroke, congestive heart failure, dementia, kidney failure, and premature death. Long-term effects of excessive salt intake include stiffening of the arteries and thickening of heart muscle and organ damage. Recommendations include ways to reduce hypertension and the risk of heart disease.  Diseases of Our Time - Focusing on Diabetes Clinical staff conducted group or individual video education with verbal and written material and guidebook.  Patient learns why the best way to stop diseases of our time is prevention, through food and other lifestyle changes. Medicine (such as prescription pills and surgeries) is often only a Band-Aid on the problem, not a long-term solution. Most common diseases of our time include obesity, type 2 diabetes, hypertension, heart disease, and cancer. The Pritikin Program is recommended and has been proven to help reduce, reverse, and/or prevent the damaging effects of metabolic syndrome.  Nutrition   Overview of the Pritikin Eating Plan  Clinical staff conducted group or individual video education with verbal and written material and guidebook.  Patient learns about the Pritikin Eating Plan for disease risk reduction. The Pritikin Eating Plan emphasizes a wide variety of unrefined, minimally-processed carbohydrates, like fruits, vegetables, whole grains, and legumes. Go,  Caution, and Stop food choices are explained. Plant-based and lean animal proteins are emphasized. Rationale provided for low sodium intake for blood pressure control, low added sugars for blood sugar stabilization, and low added fats and oils for coronary  artery disease risk reduction and weight management.  Calorie Density  Clinical staff conducted group or individual video education with verbal and written material and guidebook.  Patient learns about calorie density and how it impacts the Pritikin Eating Plan. Knowing the characteristics of the food you choose will help you decide whether those foods will lead to weight gain or weight loss, and whether you want to consume more or less of them. Weight loss is usually a side effect of the Pritikin Eating Plan because of its focus on low calorie-dense foods.  Label Reading  Clinical staff conducted group or individual video education with verbal and written material and guidebook.  Patient learns about the Pritikin recommended label reading guidelines and corresponding recommendations regarding calorie density, added sugars, sodium content, and whole grains.  Dining Out - Part 1  Clinical staff conducted group or individual video education with verbal and written material and guidebook.  Patient learns that restaurant meals can be sabotaging because they can be so high in calories, fat, sodium, and/or sugar. Patient learns recommended strategies on how to positively address this and avoid unhealthy pitfalls.  Facts on Fats  Clinical staff conducted group or individual video education with verbal and written material and guidebook.  Patient learns that lifestyle modifications can be just as effective, if not more so, as many medications for lowering your risk of heart disease. A Pritikin lifestyle can help to reduce your risk of inflammation and atherosclerosis (cholesterol build-up, or plaque, in the artery walls). Lifestyle interventions such as dietary choices and physical activity address the cause of atherosclerosis. A review of the types of fats and their impact on blood cholesterol levels, along with dietary recommendations to reduce fat intake is also included.  Nutrition Action Plan   Clinical staff conducted group or individual video education with verbal and written material and guidebook.  Patient learns how to incorporate Pritikin recommendations into their lifestyle. Recommendations include planning and keeping personal health goals in mind as an important part of their success.  Healthy Mind-Set    Healthy Minds, Bodies, Hearts  Clinical staff conducted group or individual video education with verbal and written material and guidebook.  Patient learns how to identify when they are stressed. Video will discuss the impact of that stress, as well as the many benefits of stress management. Patient will also be introduced to stress management techniques. The way we think, act, and feel has an impact on our hearts.  How Our Thoughts Can Heal Our Hearts  Clinical staff conducted group or individual video education with verbal and written material and guidebook.  Patient learns that negative thoughts can cause depression and anxiety. This can result in negative lifestyle behavior and serious health problems. Cognitive behavioral therapy is an effective method to help control our thoughts in order to change and improve our emotional outlook.  Additional Videos:  Exercise    Improving Performance  Clinical staff conducted group or individual video education with verbal and written material and guidebook.  Patient learns to use a non-linear approach by alternating intensity levels and lengths of time spent exercising to help burn more calories and lose more body fat. Cardiovascular exercise helps improve heart health, metabolism, hormonal balance, blood sugar control, and recovery from fatigue. Resistance  training improves strength, endurance, balance, coordination, reaction time, metabolism, and muscle mass. Flexibility exercise improves circulation, posture, and balance. Seek guidance from your physician and exercise physiologist before implementing an exercise routine and learn  your capabilities and proper form for all exercise.  Introduction to Yoga  Clinical staff conducted group or individual video education with verbal and written material and guidebook.  Patient learns about yoga, a discipline of the coming together of mind, breath, and body. The benefits of yoga include improved flexibility, improved range of motion, better posture and core strength, increased lung function, weight loss, and positive self-image. Yoga's heart health benefits include lowered blood pressure, healthier heart rate, decreased cholesterol and triglyceride levels, improved immune function, and reduced stress. Seek guidance from your physician and exercise physiologist before implementing an exercise routine and learn your capabilities and proper form for all exercise.  Medical   Aging: Enhancing Your Quality of Life  Clinical staff conducted group or individual video education with verbal and written material and guidebook.  Patient learns key strategies and recommendations to stay in good physical health and enhance quality of life, such as prevention strategies, having an advocate, securing a Health Care Proxy and Power of Attorney, and keeping a list of medications and system for tracking them. It also discusses how to avoid risk for bone loss.  Biology of Weight Control  Clinical staff conducted group or individual video education with verbal and written material and guidebook.  Patient learns that weight gain occurs because we consume more calories than we burn (eating more, moving less). Even if your body weight is normal, you may have higher ratios of fat compared to muscle mass. Too much body fat puts you at increased risk for cardiovascular disease, heart attack, stroke, type 2 diabetes, and obesity-related cancers. In addition to exercise, following the Pritikin Eating Plan can help reduce your risk.  Decoding Lab Results  Clinical staff conducted group or individual video education  with verbal and written material and guidebook.  Patient learns that lab test reflects one measurement whose values change over time and are influenced by many factors, including medication, stress, sleep, exercise, food, hydration, pre-existing medical conditions, and more. It is recommended to use the knowledge from this video to become more involved with your lab results and evaluate your numbers to speak with your doctor.   Diseases of Our Time - Overview  Clinical staff conducted group or individual video education with verbal and written material and guidebook.  Patient learns that according to the CDC, 50% to 70% of chronic diseases (such as obesity, type 2 diabetes, elevated lipids, hypertension, and heart disease) are avoidable through lifestyle improvements including healthier food choices, listening to satiety cues, and increased physical activity.  Sleep Disorders Clinical staff conducted group or individual video education with verbal and written material and guidebook.  Patient learns how good quality and duration of sleep are important to overall health and well-being. Patient also learns about sleep disorders and how they impact health along with recommendations to address them, including discussing with a physician.  Nutrition  Dining Out - Part 2 Clinical staff conducted group or individual video education with verbal and written material and guidebook.  Patient learns how to plan ahead and communicate in order to maximize their dining experience in a healthy and nutritious manner. Included are recommended food choices based on the type of restaurant the patient is visiting.   Fueling a Banker conducted group or individual video  education with verbal and written material and guidebook.  There is a strong connection between our food choices and our health. Diseases like obesity and type 2 diabetes are very prevalent and are in large-part due to lifestyle  choices. The Pritikin Eating Plan provides plenty of food and hunger-curbing satisfaction. It is easy to follow, affordable, and helps reduce health risks.  Menu Workshop  Clinical staff conducted group or individual video education with verbal and written material and guidebook.  Patient learns that restaurant meals can sabotage health goals because they are often packed with calories, fat, sodium, and sugar. Recommendations include strategies to plan ahead and to communicate with the manager, chef, or server to help order a healthier meal.  Planning Your Eating Strategy  Clinical staff conducted group or individual video education with verbal and written material and guidebook.  Patient learns about the Pritikin Eating Plan and its benefit of reducing the risk of disease. The Pritikin Eating Plan does not focus on calories. Instead, it emphasizes high-quality, nutrient-rich foods. By knowing the characteristics of the foods, we choose, we can determine their calorie density and make informed decisions.  Targeting Your Nutrition Priorities  Clinical staff conducted group or individual video education with verbal and written material and guidebook.  Patient learns that lifestyle habits have a tremendous impact on disease risk and progression. This video provides eating and physical activity recommendations based on your personal health goals, such as reducing LDL cholesterol, losing weight, preventing or controlling type 2 diabetes, and reducing high blood pressure.  Vitamins and Minerals  Clinical staff conducted group or individual video education with verbal and written material and guidebook.  Patient learns different ways to obtain key vitamins and minerals, including through a recommended healthy diet. It is important to discuss all supplements you take with your doctor.   Healthy Mind-Set    Smoking Cessation  Clinical staff conducted group or individual video education with verbal and  written material and guidebook.  Patient learns that cigarette smoking and tobacco addiction pose a serious health risk which affects millions of people. Stopping smoking will significantly reduce the risk of heart disease, lung disease, and many forms of cancer. Recommended strategies for quitting are covered, including working with your doctor to develop a successful plan.  Culinary   Becoming a Set designer conducted group or individual video education with verbal and written material and guidebook.  Patient learns that cooking at home can be healthy, cost-effective, quick, and puts them in control. Keys to cooking healthy recipes will include looking at your recipe, assessing your equipment needs, planning ahead, making it simple, choosing cost-effective seasonal ingredients, and limiting the use of added fats, salts, and sugars.  Cooking - Breakfast and Snacks  Clinical staff conducted group or individual video education with verbal and written material and guidebook.  Patient learns how important breakfast is to satiety and nutrition through the entire day. Recommendations include key foods to eat during breakfast to help stabilize blood sugar levels and to prevent overeating at meals later in the day. Planning ahead is also a key component.  Cooking - Educational psychologist conducted group or individual video education with verbal and written material and guidebook.  Patient learns eating strategies to improve overall health, including an approach to cook more at home. Recommendations include thinking of animal protein as a side on your plate rather than center stage and focusing instead on lower calorie dense options like vegetables, fruits, whole  grains, and plant-based proteins, such as beans. Making sauces in large quantities to freeze for later and leaving the skin on your vegetables are also recommended to maximize your experience.  Cooking - Healthy Salads and  Dressing Clinical staff conducted group or individual video education with verbal and written material and guidebook.  Patient learns that vegetables, fruits, whole grains, and legumes are the foundations of the Pritikin Eating Plan. Recommendations include how to incorporate each of these in flavorful and healthy salads, and how to create homemade salad dressings. Proper handling of ingredients is also covered. Cooking - Soups and [employer] - Soups and Desserts Clinical staff conducted group or individual video education with verbal and written material and guidebook.  Patient learns that Pritikin soups and desserts make for easy, nutritious, and delicious snacks and meal components that are low in sodium, fat, sugar, and calorie density, while high in vitamins, minerals, and filling fiber. Recommendations include simple and healthy ideas for soups and desserts.   Overview     The Pritikin Solution Program Overview Clinical staff conducted group or individual video education with verbal and written material and guidebook.  Patient learns that the results of the Pritikin Program have been documented in more than 100 articles published in peer-reviewed journals, and the benefits include reducing risk factors for (and, in some cases, even reversing) high cholesterol, high blood pressure, type 2 diabetes, obesity, and more! An overview of the three key pillars of the Pritikin Program will be covered: eating well, doing regular exercise, and having a healthy mind-set.  WORKSHOPS  Exercise: Exercise Basics: Building Your Action Plan Clinical staff led group instruction and group discussion with PowerPoint presentation and patient guidebook. To enhance the learning environment the use of posters, models and videos may be added. At the conclusion of this workshop, patients will comprehend the difference between physical activity and exercise, as well as the benefits of incorporating both, into  their routine. Patients will understand the FITT (Frequency, Intensity, Time, and Type) principle and how to use it to build an exercise action plan. In addition, safety concerns and other considerations for exercise and cardiac rehab will be addressed by the presenter. The purpose of this lesson is to promote a comprehensive and effective weekly exercise routine in order to improve patients' overall level of fitness.   Managing Heart Disease: Your Path to a Healthier Heart Clinical staff led group instruction and group discussion with PowerPoint presentation and patient guidebook. To enhance the learning environment the use of posters, models and videos may be added.At the conclusion of this workshop, patients will understand the anatomy and physiology of the heart. Additionally, they will understand how Pritikin's three pillars impact the risk factors, the progression, and the management of heart disease.  The purpose of this lesson is to provide a high-level overview of the heart, heart disease, and how the Pritikin lifestyle positively impacts risk factors.  Exercise Biomechanics Clinical staff led group instruction and group discussion with PowerPoint presentation and patient guidebook. To enhance the learning environment the use of posters, models and videos may be added. Patients will learn how the structural parts of their bodies function and how these functions impact their daily activities, movement, and exercise. Patients will learn how to promote a neutral spine, learn how to manage pain, and identify ways to improve their physical movement in order to promote healthy living. The purpose of this lesson is to expose patients to common physical limitations that impact physical activity. Participants  will learn practical ways to adapt and manage aches and pains, and to minimize their effect on regular exercise. Patients will learn how to maintain good posture while sitting, walking, and  lifting.  Balance Training and Fall Prevention  Clinical staff led group instruction and group discussion with PowerPoint presentation and patient guidebook. To enhance the learning environment the use of posters, models and videos may be added. At the conclusion of this workshop, patients will understand the importance of their sensorimotor skills (vision, proprioception, and the vestibular system) in maintaining their ability to balance as they age. Patients will apply a variety of balancing exercises that are appropriate for their current level of function. Patients will understand the common causes for poor balance, possible solutions to these problems, and ways to modify their physical environment in order to minimize their fall risk. The purpose of this lesson is to teach patients about the importance of maintaining balance as they age and ways to minimize their risk of falling.  WORKSHOPS   Nutrition:  Fueling a Ship broker led group instruction and group discussion with PowerPoint presentation and patient guidebook. To enhance the learning environment the use of posters, models and videos may be added. Patients will review the foundational principles of the Pritikin Eating Plan and understand what constitutes a serving size in each of the food groups. Patients will also learn Pritikin-friendly foods that are better choices when away from home and review make-ahead meal and snack options. Calorie density will be reviewed and applied to three nutrition priorities: weight maintenance, weight loss, and weight gain. The purpose of this lesson is to reinforce (in a group setting) the key concepts around what patients are recommended to eat and how to apply these guidelines when away from home by planning and selecting Pritikin-friendly options. Patients will understand how calorie density may be adjusted for different weight management goals.  Mindful Eating  Clinical staff led  group instruction and group discussion with PowerPoint presentation and patient guidebook. To enhance the learning environment the use of posters, models and videos may be added. Patients will briefly review the concepts of the Pritikin Eating Plan and the importance of low-calorie dense foods. The concept of mindful eating will be introduced as well as the importance of paying attention to internal hunger signals. Triggers for non-hunger eating and techniques for dealing with triggers will be explored. The purpose of this lesson is to provide patients with the opportunity to review the basic principles of the Pritikin Eating Plan, discuss the value of eating mindfully and how to measure internal cues of hunger and fullness using the Hunger Scale. Patients will also discuss reasons for non-hunger eating and learn strategies to use for controlling emotional eating.  Targeting Your Nutrition Priorities Clinical staff led group instruction and group discussion with PowerPoint presentation and patient guidebook. To enhance the learning environment the use of posters, models and videos may be added. Patients will learn how to determine their genetic susceptibility to disease by reviewing their family history. Patients will gain insight into the importance of diet as part of an overall healthy lifestyle in mitigating the impact of genetics and other environmental insults. The purpose of this lesson is to provide patients with the opportunity to assess their personal nutrition priorities by looking at their family history, their own health history and current risk factors. Patients will also be able to discuss ways of prioritizing and modifying the Pritikin Eating Plan for their highest risk areas  Menu  Clinical staff led group instruction and group discussion with PowerPoint presentation and patient guidebook. To enhance the learning environment the use of posters, models and videos may be added. Using menus  brought in from [employer], or printed from [employer], patients will apply the Pritikin dining out guidelines that were presented in the [employer] video. Patients will also be able to practice these guidelines in a variety of provided scenarios. The purpose of this lesson is to provide patients with the opportunity to practice hands-on learning of the Pritikin Dining Out guidelines with actual menus and practice scenarios.  Label Reading Clinical staff led group instruction and group discussion with PowerPoint presentation and patient guidebook. To enhance the learning environment the use of posters, models and videos may be added. Patients will review and discuss the Pritikin label reading guidelines presented in Pritikin's Label Reading Educational series video. Using fool labels brought in from local grocery stores and markets, patients will apply the label reading guidelines and determine if the packaged food meet the Pritikin guidelines. The purpose of this lesson is to provide patients with the opportunity to review, discuss, and practice hands-on learning of the Pritikin Label Reading guidelines with actual packaged food labels. Cooking School  Pritikin's [employer] are designed to teach patients ways to prepare quick, simple, and affordable recipes at home. The importance of nutrition's role in chronic disease risk reduction is reflected in its emphasis in the overall Pritikin program. By learning how to prepare essential core Pritikin Eating Plan recipes, patients will increase control over what they eat; be able to customize the flavor of foods without the use of added salt, sugar, or fat; and improve the quality of the food they consume. By learning a set of core recipes which are easily assembled, quickly prepared, and affordable, patients are more likely to prepare more healthy foods at home. These workshops focus on convenient breakfasts, simple  entres, side dishes, and desserts which can be prepared with minimal effort and are consistent with nutrition recommendations for cardiovascular risk reduction. Cooking [employer] are taught by a Armed forces logistics/support/administrative officer (RD) who has been trained by the [employer]. The chef or RD has a clear understanding of the importance of minimizing - if not completely eliminating - added fat, sugar, and sodium in recipes. Throughout the series of Cooking School Workshop sessions, patients will learn about healthy ingredients and efficient methods of cooking to build confidence in their capability to prepare    Cooking School weekly topics:  Adding Flavor- Sodium-Free  Fast and Healthy Breakfasts  Powerhouse Plant-Based Proteins  Satisfying Salads and Dressings  Simple Sides and Sauces  International Cuisine-Spotlight on the [employer] Zones  Delicious Desserts  Savory Soups  [employer] - Meals in a Astronomer Appetizers and Snacks  Comforting Weekend Breakfasts  One-Pot Wonders   Fast Evening Meals  Landscape architect Your Pritikin Plate  WORKSHOPS   Healthy Mindset (Psychosocial):  Focused Goals, Sustainable Changes Clinical staff led group instruction and group discussion with PowerPoint presentation and patient guidebook. To enhance the learning environment the use of posters, models and videos may be added. Patients will be able to apply effective goal setting strategies to establish at least one personal goal, and then take consistent, meaningful action toward that goal. They will learn to identify common barriers to achieving personal goals and develop strategies to overcome them. Patients will also gain an understanding of how our mind-set can  impact our ability to achieve goals and the importance of cultivating a positive and growth-oriented mind-set. The purpose of this lesson is to provide patients with a deeper understanding of how to set and  achieve personal goals, as well as the tools and strategies needed to overcome common obstacles which may arise along the way.  From Head to Heart: The Power of a Healthy Outlook  Clinical staff led group instruction and group discussion with PowerPoint presentation and patient guidebook. To enhance the learning environment the use of posters, models and videos may be added. Patients will be able to recognize and describe the impact of emotions and mood on physical health. They will discover the importance of self-care and explore self-care practices which may work for them. Patients will also learn how to utilize the 4 C's to cultivate a healthier outlook and better manage stress and challenges. The purpose of this lesson is to demonstrate to patients how a healthy outlook is an essential part of maintaining good health, especially as they continue their cardiac rehab journey.  Healthy Sleep for a Healthy Heart Clinical staff led group instruction and group discussion with PowerPoint presentation and patient guidebook. To enhance the learning environment the use of posters, models and videos may be added. At the conclusion of this workshop, patients will be able to demonstrate knowledge of the importance of sleep to overall health, well-being, and quality of life. They will understand the symptoms of, and treatments for, common sleep disorders. Patients will also be able to identify daytime and nighttime behaviors which impact sleep, and they will be able to apply these tools to help manage sleep-related challenges. The purpose of this lesson is to provide patients with a general overview of sleep and outline the importance of quality sleep. Patients will learn about a few of the most common sleep disorders. Patients will also be introduced to the concept of "sleep hygiene," and discover ways to self-manage certain sleeping problems through simple daily behavior changes. Finally, the workshop will motivate  patients by clarifying the links between quality sleep and their goals of heart-healthy living.   Recognizing and Reducing Stress Clinical staff led group instruction and group discussion with PowerPoint presentation and patient guidebook. To enhance the learning environment the use of posters, models and videos may be added. At the conclusion of this workshop, patients will be able to understand the types of stress reactions, differentiate between acute and chronic stress, and recognize the impact that chronic stress has on their health. They will also be able to apply different coping mechanisms, such as reframing negative self-talk. Patients will have the opportunity to practice a variety of stress management techniques, such as deep abdominal breathing, progressive muscle relaxation, and/or guided imagery.  The purpose of this lesson is to educate patients on the role of stress in their lives and to provide healthy techniques for coping with it.  Learning Barriers/Preferences:  Learning Barriers/Preferences - 12/02/23 1432       Learning Barriers/Preferences   Learning Barriers Sight;Hearing   glasses/hearing aids   Learning Preferences Audio;Computer/Internet;Group Instruction;Individual Instruction;Pictoral;Skilled Demonstration;Verbal Instruction;Video;Written Material          Education Topics:  Knowledge Questionnaire Score:  Knowledge Questionnaire Score - 12/02/23 1432       Knowledge Questionnaire Score   Pre Score 22/24          Core Components/Risk Factors/Patient Goals at Admission:  Personal Goals and Risk Factors at Admission - 12/02/23 1432  Core Components/Risk Factors/Patient Goals on Admission    Weight Management Yes    Intervention Weight Management: Develop a combined nutrition and exercise program designed to reach desired caloric intake, while maintaining appropriate intake of nutrient and fiber, sodium and fats, and appropriate energy expenditure  required for the weight goal.;Weight Management: Provide education and appropriate resources to help participant work on and attain dietary goals.    Expected Outcomes Short Term: Continue to assess and modify interventions until short term weight is achieved;Long Term: Adherence to nutrition and physical activity/exercise program aimed toward attainment of established weight goal;Understanding recommendations for meals to include 15-35% energy as protein, 25-35% energy from fat, 35-60% energy from carbohydrates, less than 200mg  of dietary cholesterol, 20-35 gm of total fiber daily;Understanding of distribution of calorie intake throughout the day with the consumption of 4-5 meals/snacks    Hypertension Yes    Intervention Provide education on lifestyle modifcations including regular physical activity/exercise, weight management, moderate sodium restriction and increased consumption of fresh fruit, vegetables, and low fat dairy, alcohol moderation, and smoking cessation.;Monitor prescription use compliance.    Expected Outcomes Short Term: Continued assessment and intervention until BP is < 140/32mm HG in hypertensive participants. < 130/21mm HG in hypertensive participants with diabetes, heart failure or chronic kidney disease.;Long Term: Maintenance of blood pressure at goal levels.    Lipids Yes    Intervention Provide education and support for participant on nutrition & aerobic/resistive exercise along with prescribed medications to achieve LDL 70mg , HDL >40mg .    Expected Outcomes Short Term: Participant states understanding of desired cholesterol values and is compliant with medications prescribed. Participant is following exercise prescription and nutrition guidelines.;Long Term: Cholesterol controlled with medications as prescribed, with individualized exercise RX and with personalized nutrition plan. Value goals: LDL < 70mg , HDL > 40 mg.    Stress Yes    Intervention Offer individual and/or small  group education and counseling on adjustment to heart disease, stress management and health-related lifestyle change. Teach and support self-help strategies.;Refer participants experiencing significant psychosocial distress to appropriate mental health specialists for further evaluation and treatment. When possible, include family members and significant others in education/counseling sessions.    Expected Outcomes Short Term: Participant demonstrates changes in health-related behavior, relaxation and other stress management skills, ability to obtain effective social support, and compliance with psychotropic medications if prescribed.;Long Term: Emotional wellbeing is indicated by absence of clinically significant psychosocial distress or social isolation.          Core Components/Risk Factors/Patient Goals Review:   Goals and Risk Factor Review     Row Name 01/01/24 8657 01/29/24 1112 02/26/24 1011         Core Components/Risk Factors/Patient Goals Review   Personal Goals Review Weight Management/Obesity;Hypertension;Lipids;Stress Weight Management/Obesity;Hypertension;Lipids;Stress Weight Management/Obesity;Hypertension;Lipids;Stress     Review Nadege has been doing well with exercise at cardiac rehab. Vital signs have been stable. Scarlettrose has gained 3 kg since starting the program Khloie continues to do  well with exercise at cardiac rehab. Vital signs remain stable. Tiann has gained 2.2 kg since starting the program Tameia continues to do  well with exercise at cardiac rehab. Vital signs remain stable. Shealyn has gained 2.3 kg since starting the program. Griselda has increased her workloads     Expected Outcomes Nyomie will continue to participate in cardiac rehab for exercise nutrtion and lifestyle modifications [name] will continue to participate in cardiac rehab for exercise nutrtion and lifestyle modifications Florance will continue to participate in cardiac rehab for  exercise nutrtion and lifestyle  modifications        Core Components/Risk Factors/Patient Goals at Discharge (Final Review):   Goals and Risk Factor Review - 02/26/24 1011       Core Components/Risk Factors/Patient Goals Review   Personal Goals Review Weight Management/Obesity;Hypertension;Lipids;Stress    Review Annalyse continues to do  well with exercise at cardiac rehab. Vital signs remain stable. Eisley has gained 2.3 kg since starting the program. 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 Introduction to pritikin education/intensive cardiac rehab. Initial orientation packet reviewed with patient. 30 Day ITP Review. Ashle started cardiac rehab on 12/09/23. Jax did well with exercise. 30 Day ITP Review. Roselyn has good attendance and participation with exercise at cardiac rehab 30 Day ITP Review. Maicy continues to have  good attendance and participation with exercise at cardiac rehab. Ladell was absent from cardiac rehab due to being out of town/ Vacation 30 Day ITP Review. Adelisa continues to have  good attendance and participation with exercise at cardiac rehab. Aunesti will tenatively complete cardiac rehab on 02/26/24      Comments: See ITP comments.Monte Antonio RN BSN

## 2024-03-04 ENCOUNTER — Other Ambulatory Visit: Payer: Self-pay | Admitting: Internal Medicine

## 2024-03-04 DIAGNOSIS — Z1231 Encounter for screening mammogram for malignant neoplasm of breast: Secondary | ICD-10-CM

## 2024-03-05 ENCOUNTER — Ambulatory Visit
Admission: RE | Admit: 2024-03-05 | Discharge: 2024-03-05 | Disposition: A | Discharge status: Home / Self Care | Source: Ambulatory Visit | Attending: Internal Medicine | Admitting: Internal Medicine

## 2024-03-05 DIAGNOSIS — Z1231 Encounter for screening mammogram for malignant neoplasm of breast: Secondary | ICD-10-CM

## 2024-03-12 ENCOUNTER — Telehealth: Payer: Self-pay | Admitting: Cardiology

## 2024-03-12 NOTE — Telephone Encounter (Signed)
 Message sent to Dr.Jordan to ask if patient needs antibiotic before dental work.

## 2024-03-12 NOTE — Telephone Encounter (Signed)
 Pt will be having a dental cleaning and wants to know if she needs to take an antibiotic prior to cleaning

## 2024-03-12 NOTE — Telephone Encounter (Signed)
 Left message for patient to call back

## 2024-03-15 NOTE — Progress Notes (Unsigned)
 Cardiology Office Note    Date:  03/17/2024   ID:  Arielys, Wandersee June 26, 1946, MRN [id number]  PCP:  Stephane Leita VEAR, MD  Cardiologist:  Drequan Ironside [place], MD    History of Present Illness:  SHAHLA BETSILL is a 78 y.o. female seen for follow up  of chronic AI.   She also has a history of aortic stenosis/AI. She had a normal Myoview  study in July 2016. She also had an Echo at that time showing Aortic root enlargement with moderate  AI. This was stable from prior exams. Seen in October 2017 with symptoms of chest pain and palpitations. Echo reported severe AI with dilated aorta of 4.4 cm. EF and LV dimensions were normal. Myoview  study was abnormal with evidence of apical ischemia. This led to a cardiac cath in November 2017 showing minimal nonobstructive CAD. Normal LV function and normal LVEDP. She also wore an event monitor that was normal with no arrhythmia.   Echo in March 2021 showed normal EF with moderate AI.   Echocardiogram obtained on 01/23/2021 showed EF 65 to 70%, grade 1 DD, mild LVH, moderate to severe AI with moderate aortic stenosis, borderline dilated ascending aorta measuring at 39 mm.  CTA of the chest obtained on 03/30/2021 showed a  thoracic aortic aneurysm measuring at 4.5 cm.   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     HIDA scan obtained on 05/22/2021 was normal.  She was seen in September 2022 with complaints of intermittent palpitations. She wore an event monitor which showed some minor NSVT. She did have some episodes of SVT longest lasting 3 minutes. Metoprolol  dose was increased. When seen in November symptoms were improved.   Follow up CT in February showed stable aortic size 4.5 cm. Last Echo in October showed severe AI, mild AS. EF preserved. LV dimension OK.   Right  left heart catheterization showing nonobstructive CAD with severe AI, proximal aortic aneurysm with normal left and right filling pressures preserved cardiac output.  She had AVR 10/30/2023 with  TEE showing stable valve with no perivalvular leaks.  Postoperatively did well but EKG now with new LBBB.  Noted to have postoperative atrial fibrillation on day 2, but no recurrences.  Not discharged on anticoagulation.  Otherwise postoperatively did well with no significant complications.    On follow up today she is doing very well. Denies any chest pain, SOB, palpitations. She has completed cardiac Rehab and states she is doing twice as much as she did before surgery. No swelling. Repeat Echo in April showed normal LV function and good prosthetic AV function.     Past Medical History:  Diagnosis Date   Allergy    Anxiety    Arthritis    Chest pain    a. 03/2015 Lexiscan  MV: EF 59%, no ischemia/infarct.   Colon polyps    COVID-19 virus infection    09-22-2020, 10/2022 fully vax'd   Depression    Family history of adverse reaction to anesthesia    daughter has PONV   GERD (gastroesophageal reflux disease)    Heart murmur    Hemorrhoids    Hyperlipidemia    Hypertension    Moderate aortic insufficiency    a. 03/2015 Echo: EF 55-60%, no rwma, mild LVH, Gr 1 DD, mild AS, mod AI (bicuspid AoV  by report, poorly visualized on this study), Asc Ao diam 44mm, triv MR.    Past Surgical History:  Procedure Laterality Date   AORTIC VALVE REPLACEMENT N/A 10/30/2023   Procedure: AORTIC VALVE REPLACEMENT (AVR) USING INSPIRIS AORTIC VALVE SIZE ; SEPTAL MYECTOMY EXCISION;  Surgeon: Kerrin Elspeth BROCKS, MD;  Location: [facility] OR;  Service: Open Heart Surgery;  Laterality: N/A;   CARDIAC CATHETERIZATION N/A 07/24/2016   Procedure: Left Heart Cath and Coronary Angiography;  Surgeon: Demetress Tift M [place], MD;  Location: [facility] INVASIVE CV LAB;  Service: Cardiovascular;  Laterality: N/A;   COLONOSCOPY     HAND DEBRIDEMENT   07/2022   right hand   NERVE, TENDON AND ARTERY REPAIR Right 08/07/2022   Procedure: NERVE, TENDON AND ARTERY REPAIR;  Surgeon: Murrell Drivers, MD;  Location: [facility] OR;  Service: Orthopedics;  Laterality: Right;   POLYPECTOMY     RIGHT HEART CATH AND CORONARY ANGIOGRAPHY N/A 10/03/2023   Procedure: RIGHT HEART CATH AND CORONARY ANGIOGRAPHY;  Surgeon: [place], Nikelle Malatesta M, MD;  Location: [facility] INVASIVE CV LAB;  Service: Cardiovascular;  Laterality: N/A;   TEE WITHOUT CARDIOVERSION N/A 10/30/2023   Procedure: TRANSESOPHAGEAL ECHOCARDIOGRAM (TEE);  Surgeon: Kerrin Elspeth BROCKS, MD;  Location: [facility] OR;  Service: Open Heart Surgery;  Laterality: N/A;   THORACIC AORTIC ANEURYSM REPAIR N/A 10/30/2023   Procedure: ASCENDING AORTIC ANEURYSM REPAIR (AAA) USING HEMASHIELD PLATINUM GRAFT SIZE ;  Surgeon: Kerrin Elspeth BROCKS, MD;  Location: [facility] OR;  Service: Open Heart Surgery;  Laterality: N/A;   THORACIC AORTOGRAM N/A 10/03/2023   Procedure: THORACIC AORTOGRAM;  Surgeon: [place], Keishia Ground M, MD;  Location: [facility] INVASIVE CV LAB;  Service: Cardiovascular;  Laterality: N/A;   TONSILLECTOMY AND ADENOIDECTOMY     TUBAL LIGATION     WOUND EXPLORATION Right 08/07/2022   Procedure: WOUND EXPLORATION RIGHT WRIST;  Surgeon: Murrell Drivers, MD;  Location: [facility] OR;  Service: Orthopedics;  Laterality: Right;  60 MIN    Current Medications: Outpatient Medications Prior to Visit  Medication Sig Dispense Refill   acetaminophen  (TYLENOL ) 325 MG tablet Take 2 tablets (650 mg total) by mouth every 6 (six) hours as needed.     amoxicillin  (AMOXIL ) 500 MG capsule Take 4 capsules ( 2 grams ) 1 hour before dental work. 4 capsule 3   Bacillus Coagulans-Inulin (PROBIOTIC-PREBIOTIC PO) Take 1 tablet by mouth daily.     buPROPion  (WELLBUTRIN  XL) 150 MG 24 hr tablet Take 300 mg by mouth every morning.  3   carvedilol  (COREG ) 12.5 MG tablet Take 1 tablet (12.5 mg total) by mouth 2 (two) times daily with a meal. 180 tablet 3   Fiber Adult Gummies 2 g  CHEW Chew 3 tablets by mouth daily at 6 (six) AM.     MAGNESIUM  CITRATE PO Take 220 mg by mouth as needed.     mirtazapine  (REMERON  SOL-TAB) 45 MG disintegrating tablet Take 45 mg by mouth at bedtime.     pantoprazole  (PROTONIX ) 40 MG tablet TAKE 1 TABLET(40 MG) BY MOUTH TWICE DAILY BEFORE A MEAL 180 tablet 3   rosuvastatin  (CRESTOR ) 10 MG tablet Take 10 mg by mouth daily.     temazepam (RESTORIL) 15 MG capsule Take 15 mg by mouth at bedtime.     amLODipine  (NORVASC ) 2.5 MG tablet Take 2.5 mg by mouth daily.     amLODipine  (NORVASC ) 5 MG tablet TAKE 1 TABLET(5 MG) BY MOUTH DAILY (Patient taking differently: Take 2.5 mg by mouth daily.) 90 tablet 3   aspirin  EC 325  MG tablet Take 1 tablet (325 mg total) by mouth daily. 100 tablet 3   mirtazapine  (REMERON ) 45 MG tablet Take 45 mg by mouth at bedtime.     temazepam (RESTORIL) 15 MG capsule Take 15 mg by mouth at bedtime as needed for sleep. (Patient taking differently: Take 15 mg by mouth at bedtime.)     No facility-administered medications prior to visit.     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  Tobacco Use   Smoking status: Former    Current packs/day: 0.50    Average packs/day: 0.5 packs/day for 15.0 years (7.5 ttl pk-yrs)    Types: Cigarettes   Smokeless tobacco: Never  Vaping Use   Vaping status: Never Used  Substance and Sexual Activity   Alcohol  use: Yes    Alcohol /week: 0.0 standard drinks of alcohol     Comment: 2-3 per day   Drug use: No   Sexual activity: Not Currently  Other Topics Concern   Not on file  Social History Narrative   Not on file   Social Drivers of Health   Financial Resource Strain: Not on file  Food  Insecurity: No Food Insecurity (10/31/2023)   Hunger Vital Sign    Worried About Running Out of Food in the Last Year: Never true    Ran Out of Food in the Last Year: Never true  Transportation Needs: No Transportation Needs (10/31/2023)   PRAPARE - Administrator, Civil Service (Medical): No    Lack of Transportation (Non-Medical): No  Physical Activity: Not on file  Stress: Not on file  Social Connections: Socially Integrated (10/31/2023)   Social Connection and Isolation Panel    Frequency of Communication with Friends and Family: Never    Frequency of Social Gatherings with Friends and Family: More than three times a week    Attends Religious Services: More than 4 times per year    Active Member of [employer] or Organizations: Yes    Attends Engineer, structural: More than 4 times per year    Marital Status: Married     Family History:  The patient's family history includes Colon cancer in her maternal grandmother; Diverticulosis in her maternal grandfather; HIV in her brother; Heart attack in her father; Heart disease in her father; Stomach cancer in her maternal grandmother; Stroke in her paternal grandfather.   ROS:   Please see the history of present illness.    ROS All other systems reviewed and are negative.   PHYSICAL EXAM:   VS:  BP 110/70 (BP Location: Left Arm, Patient Position: Sitting)   Pulse 73   Ht 5' 3 (1.6 m)   Wt 122 lb 6.4 oz (55.5 kg)   SpO2 97%   BMI 21.68 kg/m    GEN: Well nourished, well developed, in no acute distress  HEENT: normal  Neck: no JVD, carotid bruits, or masses Cardiac: RRR; there is a soft early systolic ejection murmur RUSB. No edema Respiratory:  clear to auscultation bilaterally, normal work of breathing GI: soft, nontender, nondistended, + BS MS: no deformity or atrophy  Skin: warm and dry, no rash Neuro:  Alert and Oriented x 3, Strength and sensation are intact Psych: euthymic mood, full affect  Wt Readings from  Last 3 Encounters:  03/17/24 122 lb 6.4 oz (55.5 kg)  02/26/24 125 lb 10.6 oz (57 kg)  12/02/23 120 lb 13 oz (54.8 kg)      Studies/Labs Reviewed:        Recent Labs: 10/28/2023: ALT 27 10/31/2023: Magnesium  2.0 11/03/2023: BUN 16; Creatinine, Ser 0.73; Hemoglobin 8.4; Platelets 131; Potassium 4.6; Sodium 136   Lipid Panel    Component Value Date/Time   CHOL 190 07/19/2016 1050   TRIG 77 07/19/2016 1050   HDL 86 07/19/2016 1050   CHOLHDL 2.2 07/19/2016 1050   VLDL 15 07/19/2016 1050   LDLCALC 89 07/19/2016 1050  Dated 04/14/20: normal CBC Dated 11/14/20: cholesterol 198, triglycerides 80, HDL 77, LDL 105. CMET and TSH normal Dated 11/16/21: cholesterol 142, triglycerides 61, HDL 60, LDL 70. CMET and TSH normal Dated 09/19/22: normal CBC.  Dated 12/25/22: cholesterol 144, triglycerides 66, HDL 73, LDL 58, CMET and TSH normal.  Dated 07/09/23: CMET and CBC normal.    Additional studies/ records that were reviewed today include:  Myoview  07/13/16:Study Highlights   Nuclear stress EF: 60%. The left ventricular ejection fraction is normal (55-65%). There was no ST segment deviation noted during stress. There is a small defect of mild severity present in the apical lateral and apical location. The defect is reversible and is consistent with a small area of ischemia although shifting breast attenuation artifact. is also a possiblity. This is an intermediate risk study due to apical reversible defect.   Echo: 07/23/16:Study Conclusions   - Left ventricle: The cavity size was normal. There was mild focal   basal hypertrophy of the septum. Systolic function was normal.   The estimated ejection fraction was in the range of 60% to 65%.   Wall motion was normal; there were no regional wall motion   abnormalities. - Aortic valve: Probably trileaflet; moderately thickened,   moderately calcified leaflets. Transvalvular velocity was   minimally increased. There was no stenosis. There was  severe   regurgitation. - Aorta: Ascending aortic diameter: 44 mm (S). - Aortic root: The aortic root was moderately dilated. - Mitral valve: There was mild regurgitation.  Cardiac cath 07/24/16:Conclusion     The left ventricular systolic function is normal. LV end diastolic pressure is normal. The left ventricular ejection fraction is 55-65% by visual estimate. Prox LAD lesion, 20 %stenosed. Prox RCA to Mid RCA lesion, 10 %stenosed.   1. Mild nonobstructive CAD 2. Normal LV function 3. Normal LVEDP   Plan: medical management.   Event monitor November 2017: normal   Echo 11/17/19: IMPRESSIONS     1. Left ventricular ejection fraction, by estimation, is 60 to 65%. The  left ventricle has normal function. The left ventricle has no regional  wall motion abnormalities. There is severe asymmetric left ventricular  hypertrophy of the basal-septal  segment. Left ventricular diastolic parameters are indeterminate.   2. Right ventricular systolic function is normal. The right ventricular  size is normal. Tricuspid regurgitation signal is inadequate for assessing  PA pressure.   3. The mitral valve is normal in structure. Trivial mitral valve  regurgitation.   4. The aortic valve was not well visualized. Aortic valve regurgitation  is moderate. No aortic stenosis is present.   5. Aortic dilatation noted. There is dilatation of the ascending aorta  measuring 44 mm.   6. The inferior vena cava is  normal in size with greater than 50%  respiratory variability, suggesting right atrial pressure of 3 mmHg.   Cardiac monitor 06/2021: Normal sinus rhythm 6 episode of nonsustained VT 4-7 beats Episodes of SVT- appear reentrant with sudden onset and termination. longest lasting 2 minutes and 54 seconds at rate 156 bpm     Patch Wear Time:  14 days and 0 hours (2022-09-29T15:04:27-0400 to 2022-10-13T15:04:27-0400)   Patient had a min HR of 50 bpm, max HR of 188 bpm, and avg HR of 69 bpm.  Predominant underlying rhythm was Sinus Rhythm. 6 Ventricular Tachycardia runs occurred, the run with the fastest interval lasting 4 beats with a max rate of 188 bpm, the longest  lasting 7 beats with an avg rate of 128 bpm. 18 Supraventricular Tachycardia runs occurred, the run with the fastest interval lasting 2 mins 54 secs with a max rate of 156 bpm (avg 143 bpm); the run with the fastest interval was also the longest.  Supraventricular Tachycardia was detected within +/- 45 seconds of symptomatic patient event(s). Isolated SVEs were rare (<1.0%), SVE Couplets were rare (<1.0%), and SVE Triplets were rare (<1.0%). Isolated VEs were rare (<1.0%, 2958), VE Couplets were  rare (<1.0%, 115), and VE Triplets were rare (<1.0%, 7).      Echocardiogram 01/2021: 1. Left ventricular ejection fraction, by estimation, is 65 to 70%. The  left ventricle has normal function. The left ventricle has no regional  wall motion abnormalities. There is mild left ventricular hypertrophy.  Left ventricular diastolic parameters  are consistent with Grade I diastolic dysfunction (impaired relaxation).   2. Right ventricular systolic function is normal. The right ventricular  size is normal.   3. The mitral valve is normal in structure. No evidence of mitral valve  regurgitation. No evidence of mitral stenosis.   4. The aortic valve is normal in structure. Aortic valve regurgitation is  severe. Moderate aortic valve stenosis. Aortic regurgitation PHT measures  415 msec. Aortic valve mean gradient measures 18.0 mmHg. Aortic valve Vmax  measures 3.04 m/s.   5. There is borderline dilatation of the ascending aorta, measuring 39  mm.   6. The inferior vena cava is normal in size with greater than 50%  respiratory variability, suggesting right atrial pressure of 3 mmHg.   Echo 08/01/21: IMPRESSIONS     1. Left ventricular ejection fraction, by estimation, is 60 to 65%. Left  ventricular ejection fraction by 3D  volume is 65 %. The left ventricle has  normal function. The left ventricle has no regional wall motion  abnormalities. There is moderate left  ventricular hypertrophy. Left ventricular diastolic parameters are  consistent with Grade I diastolic dysfunction (impaired relaxation).   2. Right ventricular systolic function is normal. The right ventricular  size is normal. Tricuspid regurgitation signal is inadequate for assessing  PA pressure.   3. Left atrial size was severely dilated.   4. The pericardial effusion is anterior to the right ventricle.   5. The mitral valve is degenerative. Mild mitral valve regurgitation. No  evidence of mitral stenosis.   6. The aortic valve is calcified. Aortic valve regurgitation is moderate  to severe. Moderate aortic valve stenosis. Aortic regurgitation PHT  measures 492 msec. Aortic valve area, by VTI measures 1.00 cm. Aortic  valve mean gradient measures 19.0 mmHg.  Aortic valve Vmax measures 3.01 m/s.   7. Aortic dilatation noted. There is mild dilatation of the aortic root,  measuring 39 mm.   8. The inferior vena  cava is normal in size with greater than 50%  respiratory variability, suggesting right atrial pressure of 3 mmHg.   Comparison(s): EF 65%, moderate AS with severe AI, mean gradient 18 mmHg,  peak 36.39mmHg, Ascend Aor 39mm, AI PHT 415 msec, mild MAC. Compared to  study dated 01/23/2021, The mean AVG has increased from 18 to .  Visually AI appears moderate to severe  but only mild by PHT. Echo images are suboptimol. Consider TEE of the AV  if clinically indicated.     EXAM: CT ANGIOGRAPHY CHEST WITH CONTRAST   TECHNIQUE: Multidetector CT imaging of the chest was performed using the standard protocol during bolus administration of intravenous contrast. Multiplanar CT image reconstructions and MIPs were obtained to evaluate the vascular anatomy.   RADIATION DOSE REDUCTION: This exam was performed according to  the departmental dose-optimization program which includes automated exposure control, adjustment of the mA and/or kV according to patient size and/or use of iterative reconstruction technique.   CONTRAST:  75mL OMNIPAQUE  IOHEXOL  350 MG/ML SOLN   COMPARISON:  CT angiography chest 09/26/2021   FINDINGS: Cardiovascular: Preferential opacification of the pulmonary artery. Stable aneurysmal ascending thoracic aorta measuring up to 4.5 cm in caliber. The descending thoracic aorta is normal in caliber. No dissection. Mild atherosclerotic plaque of the thoracic aorta. At least 2 vessel coronary artery calcifications. Normal heart size. No significant pericardial effusion. The main pulmonary artery is normal in caliber. No pulmonary embolus.   Mediastinum/Nodes: No enlarged mediastinal, hilar, or axillary lymph nodes. Thyroid gland, trachea, and esophagus demonstrate no significant findings.   Lungs/Pleura: Bilateral lower lobe linear atelectasis versus scarring. No focal consolidation. No pulmonary nodule. No pulmonary mass. No pleural effusion. No pneumothorax.   Upper Abdomen: No acute abnormality.   Musculoskeletal:   No chest wall abnormality.   No suspicious lytic or blastic osseous lesions. No acute displaced fracture. Multilevel degenerative changes of the spine.   Review of the MIP images confirms the above findings.   IMPRESSION: 1. Stable aneurysmal ascending thoracic aorta (4.5 cm). Ascending thoracic aortic aneurysm. Recommend semi-annual imaging followup by CTA or MRA and referral to cardiothoracic surgery if not already obtained. This recommendation follows 2010 ACCF/AHA/AATS/ACR/ASA/SCA/SCAI/SIR/STS/SVM Guidelines for the Diagnosis and Management of Patients With Thoracic Aortic Disease. Circulation. 2010; 121: [id number]. Aortic aneurysm NOS (ICD10-I71.9). 2. Aortic Atherosclerosis (ICD10-I70.0) with at least 2 vessel coronary calcifications. 3. No acute  intrapulmonary abnormality.     Electronically Signed   By: Morgane  Naveau M.D.   On: 03/12/2022 15:18  Echo 03/07/22: IMPRESSIONS     1. Left ventricular ejection fraction, by estimation, is 60 to 65%. The  left ventricle has normal function. The left ventricle has no regional  wall motion abnormalities. The left ventricular internal cavity size was  mildly dilated. There is severe  concentric left ventricular hypertrophy of the basal-septal segment. Left  ventricular diastolic parameters are consistent with Grade I diastolic  dysfunction (impaired relaxation).   2. Right ventricular systolic function is normal. The right ventricular  size is normal. There is normal pulmonary artery systolic pressure. The  estimated right ventricular systolic pressure is 19.0 mmHg.   3. The mitral valve is degenerative. Mild mitral valve regurgitation. No  evidence of mitral stenosis.   4. The aortic valve is abnormal. Aortic valve regurgitation is severe.  Aortic valve area, by VTI measures 1.29 cm, Aortic valve mean gradient  measures 28.0 mmHg, Aortic valve Vmax measures 3.83 m/s, DVI is 0.41 but  visually the AV does  not appear  stenotic. 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 Cannot get accurate waveforms to demonstrate LVOT  gradient compared to AV gradient. The AV   has been reported as moderate AS in the past but in the M Mode and  visually on 2D images the leaflets appear to open well with some AV  sclerosis. Suspect that increased AV gradient and peak velocities are due  in part to increased flow across AV from AI  as well as increased velocity in LVOT from severe BSH.   5. Aortic dilatation noted. Aneurysm of the ascending aorta, measuring 45  mm.   6. The inferior vena cava is normal in size with greater than 50%  respiratory variability, suggesting right atrial  pressure of 3 mmHg.   7. Recommend TEE to get planimetry of the AV for accurate measurement of  the AVA as well as assess severity of AI further given LV dilatation.   Echo 08/15/22: IMPRESSIONS     1. Severe AI is present. Gradients across AoV elevated (Vmax 3.7 m/s, MG  31 mmHG) but valve opens well. Suspect gradients are related to  significant AI. Would recommend TEE for clarification. LVIDd is largely  unchanged (49 mm vs 47 mm). The aortic valve   is tricuspid. Aortic valve regurgitation is severe. Mild to moderate  aortic valve stenosis. Aortic valve mean gradient measures 31.0 mmHg.  Aortic valve Vmax measures 3.75 m/s.   2. Left ventricular ejection fraction, by estimation, is 55 to 60%. The  left ventricle has normal function. The left ventricle has no regional  wall motion abnormalities. Left ventricular diastolic parameters are  consistent with Grade I diastolic  dysfunction (impaired relaxation).   3. Right ventricular systolic function is normal. The right ventricular  size is normal. There is normal pulmonary artery systolic pressure. The  estimated right ventricular systolic pressure is 19.2 mmHg.   4. The mitral valve is grossly normal. Mild mitral valve regurgitation.  No evidence of mitral stenosis.   5. There is severe dilatation of the ascending aorta, measuring 43 mm.   6. The inferior vena cava is normal in size with greater than 50%  respiratory variability, suggesting right atrial pressure of 3 mmHg.   Comparison(s): No significant change from prior study.   CT ANGIOGRAPHY CHEST WITH CONTRAST   TECHNIQUE: Multidetector CT imaging of the chest was performed using the standard protocol during bolus administration of intravenous contrast. Multiplanar CT image reconstructions and MIPs were obtained to evaluate the vascular anatomy.   RADIATION DOSE REDUCTION: This exam was performed according to the departmental dose-optimization program which includes  automated exposure control, adjustment of the mA and/or kV according to patient size and/or use of iterative reconstruction technique.   CONTRAST:  75mL ISOVUE -370 IOPAMIDOL  (ISOVUE -370) INJECTION 76%   COMPARISON:  March 12, 2022, September 27, 2022 trauma on March 30, 2021   FINDINGS: Cardiovascular: Cardiomegaly. Predominantly LEFT-sided coronary artery atherosclerotic calcifications. No significant pericardial effusion. Moderate atherosclerotic calcifications of the aorta. ReVisualization of an aneurysm of the ascending thoracic aorta. Ascending thoracic aorta measures approximately 4.5 by 4.6 cm although the evaluation is limited by motion. This is similar in comparison to prior. Sino-tubular junction measures approximately 3.6 cm. The level at the sinuses of Valsalva measure approximately 4 by 4.1 cm.   Mediastinum/Nodes: Visualized thyroid is unremarkable. No axillary adenopathy or mediastinal adenopathy.  Lungs/Pleura: No pleural effusion or pneumothorax. Scattered atelectasis. LEFT lower lobe pulmonary nodule measures 4 mm, stable dating back to July 2022 (series 9, image 277).   Upper Abdomen: Partial visualization of a dilated pancreatic duct and cyst of the LEFT liver. This is better assessed on recent MRI.   Musculoskeletal: Healing LEFT anterolateral rib fractures.   Review of the MIP images confirms the above findings.   IMPRESSION: 1. Similar aneurysmal dilatation of the ascending thoracic aorta measuring up to 4.6 cm. Recommend semi-annual imaging followup by CTA or MRA and referral to cardiothoracic surgery if not already obtained. This recommendation follows 2010 ACCF/AHA/AATS/ACR/ASA/SCA/SCAI/SIR/STS/SVM Guidelines for the Diagnosis and Management of Patients With Thoracic Aortic Disease. Circulation. 2010; 121: Z733-z630. Aortic aneurysm NOS (ICD10-I71.9) 2. Healing LEFT anterolateral rib fractures. 3. Partial visualization of a dilated pancreatic duct and  cyst of the LEFT liver. This is better assessed on recent MRI. 4. Stable 4 mm LEFT lower lobe pulmonary nodule dating back to July 2022. No follow-up needed if patient is low-risk.This recommendation follows the consensus statement: Guidelines for Management of Incidental Pulmonary Nodules Detected on CT Images: From the Fleischner Society 2017; Radiology 2017; 284:228-243.   Aortic Atherosclerosis (ICD10-I70.0).     Electronically Signed  Echo 12/20/23: IMPRESSIONS     1. Left ventricular ejection fraction, by estimation, is 45 to 50%. Left  ventricular ejection fraction by 3D volume is 50 %. The left ventricle has  mildly decreased function. The left ventricle demonstrates global  hypokinesis. There is mild left  ventricular hypertrophy of the basal-septal segment. Left ventricular  diastolic parameters are consistent with Grade I diastolic dysfunction  (impaired relaxation). The average left ventricular global longitudinal  strain is -11.3 %. The global  longitudinal strain is abnormal.   2. Right ventricular systolic function is normal. The right ventricular  size is normal.   3. The mitral valve is degenerative. Mild mitral valve regurgitation. No  evidence of mitral stenosis.   4. The aortic valve has been repaired/replaced. Aortic valve  regurgitation is not visualized. No aortic stenosis is present. There is a  21 mm Inspiris valve present in the aortic position. Aortic valve mean  gradient measures 7.4 mmHg. Aortic valve Vmax  measures 1.79 m/s.   5. Aortic root/ascending aorta has been repaired/replaced. AVA is  underestimated due to smals LVOT dimension.   6. The inferior vena cava is normal in size with greater than 50%  respiratory variability, suggesting right atrial pressure of 3 mmHg.    ASSESSMENT:    1. Aneurysm of ascending aorta without rupture (HCC)   2. Nonrheumatic aortic valve insufficiency   3. S/P AVR (aortic valve replacement)   4. Essential  hypertension         PLAN:  In order of problems listed above:  1. Hypertension: BP is great. Will discontinue amlodipine  and continue Coreg  only.   2. Non-obstructive CAD: Cath in 2017 showed mild nonobstructive CAD (pLAD 20%, p-m RCA 10%). CT chest showed mild atherosclerosis, coronary artery calcification.   Continue Crestor .  3. Severe AI/aortic stenosis: now s/p AVR. Normal prosthetic valve function by Echo in April. Can reduce ASA to 81 mg daily. Instructed on SBE prophylaxis.   4. Ascending aortic aneurysm: s/p Aortic repair/grafting.   5. PSVT/syncope: Outpatient monitor in 06/2021 in the setting of intermittent tachypalpitations showed PSVT, longest episode lasting 2 minutes 34 seconds.  no sustained arrhythmia. Continue Coreg .    6. Hyperlipidemia: LDL was 58.  Continue Crestor .  Medication Adjustments/Labs and Tests Ordered: Current medicines are reviewed at length with the patient today.  Concerns regarding medicines are outlined above.  Medication changes, Labs and Tests ordered today are listed in the Patient Instructions below. There are no Patient Instructions on file for this visit.    Signed, Joselin Crandell [place], MD  03/17/2024 4:20 PM    [facility] [address] [place], [place], [address] [phone]

## 2024-03-16 MED ORDER — AMOXICILLIN 500 MG PO CAPS: ORAL_CAPSULE | ORAL | 3 refills | Status: DC

## 2024-03-16 NOTE — Telephone Encounter (Signed)
 Called patient left message on personal voice mail you will need Amoxicillin  2 grams 1 hour prior to dental work.

## 2024-03-17 ENCOUNTER — Encounter: Payer: Self-pay | Admitting: Cardiology

## 2024-03-17 ENCOUNTER — Ambulatory Visit: Attending: Cardiology | Admitting: Cardiology

## 2024-03-17 VITALS — BP 110/70 | HR 73 | Ht 63.0 in | Wt 122.4 lb

## 2024-03-17 DIAGNOSIS — I351 Nonrheumatic aortic (valve) insufficiency: Secondary | ICD-10-CM | POA: Diagnosis not present

## 2024-03-17 DIAGNOSIS — I10 Essential (primary) hypertension: Secondary | ICD-10-CM

## 2024-03-17 DIAGNOSIS — I7121 Aneurysm of the ascending aorta, without rupture: Secondary | ICD-10-CM

## 2024-03-17 DIAGNOSIS — Z952 Presence of prosthetic heart valve: Secondary | ICD-10-CM

## 2024-03-17 MED ORDER — ASPIRIN 81 MG PO TBEC: 81.0000 mg | DELAYED_RELEASE_TABLET | Freq: Every day | ORAL | Status: AC

## 2024-03-17 NOTE — Patient Instructions (Signed)
 Medication Instructions:  Stop Amlodipine   Decrease Aspirin  to 81 mg daily Continue all other medications *If you need a refill on your cardiac medications before your next appointment, please call your pharmacy*  Lab Work: None ordered  Testing/Procedures: None ordered  Follow-Up: At [facility], you and your health needs are our priority.  As part of our continuing mission to provide you with exceptional heart care, our providers are all part of one team.  This team includes your primary Cardiologist (physician) and Advanced Practice Providers or APPs (Physician Assistants and Nurse Practitioners) who all work together to provide you with the care you need, when you need it.  Your next appointment:  6 months    Call in  Sept to schedule Jan appointment     Provider:  Dr.Jordan   We recommend signing up for the patient portal called MyChart.  Sign up information is provided on this After Visit Summary.  MyChart is used to connect with patients for Virtual Visits (Telemedicine).  Patients are able to view lab/test results, encounter notes, upcoming appointments, etc.  Non-urgent messages can be sent to your provider as well.   To learn more about what you can do with MyChart, go to [email or web address].

## 2024-03-18 ENCOUNTER — Telehealth: Payer: Self-pay | Admitting: *Deleted

## 2024-03-18 NOTE — Telephone Encounter (Signed)
   Patient Name: Erika Wu  DOB: 04/07/1946 MRN: [id number]  Primary Cardiologist: Peter [place], MD  Chart reviewed as part of pre-operative protocol coverage.   Simple dental extractions (i.e. 1-2 teeth), cleanings, are considered low risk procedures per guidelines and generally do not require any specific cardiac clearance. It is also generally accepted that for simple extractions and dental cleanings, there is no need to interrupt blood thinner therapy.   SBE prophylaxis is required for the patient from a cardiac standpoint.  I will route this recommendation to the requesting party via Epic fax function and remove from pre-op pool.  Please call with questions.  Damien JAYSON Braver, NP 03/18/2024, 5:04 PM

## 2024-03-18 NOTE — Telephone Encounter (Addendum)
   Pre-operative Risk Assessment    Patient Name: Erika Wu  DOB: 07-Apr-1946 MRN: [id number]   Date of last office visit: 03/17/24 DR. SWAZILAND Date of next office visit: NONE  Request for Surgical Clearance    Procedure:  DENTAL CLEANING ONLY  Date of Surgery:  Clearance TBD                                Surgeon:  NOT LISTED Surgeon's Group or Practice Name:  [facility] Phone number:  [phone] Fax number:  [phone]   Type of Clearance Requested:  DENTAL OFFICE ASKED IF PT NEEDS ABX - Medical  - Pharmacy:  Hold        Type of Anesthesia:     Additional requests/questions:    Bonney Niels Jest   03/18/2024, 4:57 PM

## 2024-03-23 NOTE — Telephone Encounter (Signed)
 PT is having second thoughts about the EUS scheduled for 8/5. She wants to know is there a less evasive test she can have. Please advise.

## 2024-03-23 NOTE — Telephone Encounter (Signed)
 The pt has an appt on 8/1 to discuss further and if she should keep appt as planned

## 2024-03-25 ENCOUNTER — Telehealth (HOSPITAL_COMMUNITY): Payer: Self-pay

## 2024-03-25 ENCOUNTER — Other Ambulatory Visit (HOSPITAL_COMMUNITY): Payer: Self-pay | Admitting: *Deleted

## 2024-03-25 NOTE — Telephone Encounter (Signed)
 Auth Submission: PENDING Site of care: Site of care: [facility] Payer: [employer] Medicare Medication & CPT/J Code(s) submitted: Prolia  (Denosumab ) [id number] Diagnosis Code: M81.0 Route of submission (phone, fax, portal): portal Phone # Fax # Auth type: Buy/Bill HB Units/visits requested: 60mg  x 2 doses Reference number: [id number] Transaction ID: [id number] Approval from: to

## 2024-03-28 ENCOUNTER — Other Ambulatory Visit: Payer: Self-pay | Admitting: Physician Assistant

## 2024-03-30 ENCOUNTER — Encounter (HOSPITAL_COMMUNITY)
Admission: RE | Admit: 2024-03-30 | Discharge: 2024-03-30 | Disposition: A | Discharge status: Home / Self Care | Source: Ambulatory Visit | Attending: Cardiology | Admitting: Cardiology

## 2024-03-30 DIAGNOSIS — Z48812 Encounter for surgical aftercare following surgery on the circulatory system: Secondary | ICD-10-CM | POA: Insufficient documentation

## 2024-03-30 DIAGNOSIS — Z952 Presence of prosthetic heart valve: Secondary | ICD-10-CM | POA: Insufficient documentation

## 2024-03-30 MED ORDER — DENOSUMAB 60 MG/ML ~~LOC~~ SOSY
60.0000 mg | PREFILLED_SYRINGE | Freq: Once | SUBCUTANEOUS | Status: AC
  Administered 2024-03-30: 60 mg via SUBCUTANEOUS

## 2024-04-07 ENCOUNTER — Encounter (HOSPITAL_COMMUNITY): Payer: Self-pay | Admitting: Gastroenterology

## 2024-04-07 NOTE — Progress Notes (Signed)
 Attempted to obtain medical history for pre op call via telephone, unable to reach at this time. HIPAA compliant voicemail message left requesting return call to pre surgical testing department.

## 2024-04-09 DIAGNOSIS — Z789 Other specified health status: Secondary | ICD-10-CM | POA: Diagnosis not present

## 2024-04-09 DIAGNOSIS — L538 Other specified erythematous conditions: Secondary | ICD-10-CM | POA: Diagnosis not present

## 2024-04-09 DIAGNOSIS — L2989 Other pruritus: Secondary | ICD-10-CM | POA: Diagnosis not present

## 2024-04-09 DIAGNOSIS — L820 Inflamed seborrheic keratosis: Secondary | ICD-10-CM | POA: Diagnosis not present

## 2024-04-09 DIAGNOSIS — R208 Other disturbances of skin sensation: Secondary | ICD-10-CM | POA: Diagnosis not present

## 2024-04-09 DIAGNOSIS — L738 Other specified follicular disorders: Secondary | ICD-10-CM | POA: Diagnosis not present

## 2024-04-09 DIAGNOSIS — L821 Other seborrheic keratosis: Secondary | ICD-10-CM | POA: Diagnosis not present

## 2024-04-10 ENCOUNTER — Encounter: Payer: Self-pay | Admitting: Gastroenterology

## 2024-04-10 ENCOUNTER — Ambulatory Visit: Admitting: Gastroenterology

## 2024-04-10 ENCOUNTER — Telehealth: Payer: Self-pay

## 2024-04-10 ENCOUNTER — Other Ambulatory Visit

## 2024-04-10 ENCOUNTER — Other Ambulatory Visit: Payer: Self-pay

## 2024-04-10 VITALS — BP 126/68 | HR 65 | Ht 63.0 in | Wt 120.0 lb

## 2024-04-10 DIAGNOSIS — R142 Eructation: Secondary | ICD-10-CM

## 2024-04-10 DIAGNOSIS — K862 Cyst of pancreas: Secondary | ICD-10-CM

## 2024-04-10 DIAGNOSIS — K638219 Small intestinal bacterial overgrowth, unspecified: Secondary | ICD-10-CM | POA: Diagnosis not present

## 2024-04-10 NOTE — Patient Instructions (Signed)
 Your provider has requested that you go to the basement level for lab work before leaving today. Press B on the elevator. The lab is located at the first door on the left as you exit the elevator.  You will be contacted by [facility] in the next 2 days to arrange a MRI/MRCP.  The number on your caller ID will be [phone], please answer when they call.  If you have not heard from them in 2 days please call [phone] to schedule.    _______________________________________________________  If your blood pressure at your visit was 140/90 or greater, please contact your primary care physician to follow up on this.  _______________________________________________________  If you are age 77 or older, your body mass index should be between 23-30. Your Body mass index is 21.26 kg/m. If this is out of the aforementioned range listed, please consider follow up with your Primary Care Provider.  If you are age 68 or younger, your body mass index should be between 19-25. Your Body mass index is 21.26 kg/m. If this is out of the aformentioned range listed, please consider follow up with your Primary Care Provider.   ________________________________________________________  The [facility] providers would like to encourage you to use MYCHART to communicate with providers for non-urgent requests or questions.  Due to long hold times on the telephone, sending your provider a message by [facility] may be a faster and more efficient way to get a response.  Please allow 48 business hours for a response.  Please remember that this is for non-urgent requests.  _______________________________________________________  [facility] is using a team-based approach to care.  Your team is made up of your doctor and two to three APPS. Our APPS (Nurse Practitioners and Physician Assistants) work with your physician to ensure care continuity for you. They are fully qualified to address your  health concerns and develop a treatment plan. They communicate directly with your gastroenterologist to care for you. Seeing the Advanced Practice Practitioners on your physician's team can help you by facilitating care more promptly, often allowing for earlier appointments, access to diagnostic testing, procedures, and other specialty referrals.   Thank you for choosing me and [facility].  Dr. Wilhelmenia

## 2024-04-10 NOTE — Telephone Encounter (Signed)
 Procedure:EUS Procedure date: 04/15/24 Procedure location: [facility] Arrival Time: 9:45 Spoke with the patient Y/N: Y Any prep concerns? N  Has the patient obtained the prep from the pharmacy ? N Do you have a care partner and transportation: N Any additional concerns? N   I spoke to patient on the phone where she stated that her procedure was going to be cancelled on 04/10/24 which is the date she had an appointment with Dr. Wilhelmenia.

## 2024-04-10 NOTE — Progress Notes (Signed)
 GASTROENTEROLOGY OUTPATIENT CLINIC VISIT   Primary Care Provider Stephane Leita DEL, MD [address] [place] [address] [phone]  Patient Profile: Erika Wu is a 78 y.o. 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  The patient presents to the [facility] for an evaluation and management of problem(s) noted below:  Problem List 1. Pancreatic cyst   2. Belching   3. Small intestinal bacterial overgrowth (SIBO)    Discussed the use of AI scribe software for clinical note transcription with the patient, who gave verbal consent to proceed.  History of Present Illness Please see prior GI notes for full details of HPI.  Interval History Erika Wu is a 78 year old female who presents for follow-up and evaluation of her pancreatic cyst.  When last seen in clinic an endoscopic ultrasound was scheduled to evaluate the pancreatic cyst more closely, but it was postponed due to her needing a catheterization and subsequent open heart surgery in February 2025.  Since been attending heart rehabilitation, focusing on diet and fiber intake. Post-surgery, she experienced constipation, which has been managed with fiber gummies and dietary changes. She now has regular bowel movements with stools that are formed to semi-formed and no diarrhea.  She was previously treated with Xifaxin for bacterial overgrowth, which was identified through a breath test with what she now states was improvement in her symptoms.  She is not currently on blood thinners other than aspirin .  Her pancreatic cyst was last measured in August 2024 at 1.9 by 1.4 cm.  She is concerned about the cyst and its potential growth but also convened about sedation and anesthesia.   She is not currently taking pancreatic enzyme replacement therapy (PERT) but is not on it now.  She would like to postpone her RUS for imaging if possible.  No changes in her bowel habits otherwise or any blood in stool noted.   GI Review of Systems Positive as above Negative for dysphagia, odynophagia, melena, hematochezia  Review of Systems General: Denies fevers/chills/weight loss unintentionally Cardiovascular: Denies chest pain Pulmonary: Denies shortness of breath Gastroenterological: See HPI Genitourinary: Denies darkened urine Hematological: Denies easy bruising/bleeding Dermatological: Denies jaundice Psychological: Mood is stable   Medications Current Outpatient Medications  Medication Sig Dispense Refill   acetaminophen  (TYLENOL ) 325 MG tablet Take 2 tablets (650 mg total) by mouth every 6 (six) hours as needed.     amoxicillin  (AMOXIL ) 500 MG capsule Take 4 capsules ( 2 grams ) 1 hour before dental work. 4 capsule 3   aspirin  EC 81 MG tablet Take 1 tablet (81 mg total) by mouth daily. Swallow whole.     Bacillus Coagulans-Inulin (PROBIOTIC-PREBIOTIC PO) Take 1 tablet by mouth daily.     buPROPion  (WELLBUTRIN  XL) 150 MG 24 hr tablet Take 300 mg by mouth every morning.  3   carvedilol  (COREG ) 12.5 MG tablet Take 1 tablet (12.5 mg total) by mouth 2 (two) times daily with a meal. 180 tablet 3   cholecalciferol (VITAMIN D3) 25 MCG (1000 UNIT) tablet Take 1,000 Units by mouth daily.     Fiber Adult Gummies 2 g CHEW Chew 3 tablets by mouth daily at 6 (six) AM.     MAGNESIUM  CITRATE PO Take 220 mg by mouth as needed.     mirtazapine  (REMERON   SOL-TAB) 45 MG disintegrating tablet Take 45 mg by mouth at bedtime.     pantoprazole  (PROTONIX ) 40 MG tablet TAKE 1 TABLET(40 MG) BY MOUTH TWICE DAILY BEFORE A MEAL 180 tablet 0   rosuvastatin  (CRESTOR ) 10 MG tablet Take 10 mg by mouth daily.     temazepam (RESTORIL) 15 MG capsule Take 15 mg by mouth at bedtime.     No current  facility-administered medications for this visit.    Allergies Allergies  Allergen Reactions   Latex Itching   Tramadol  Nausea And Vomiting    And made her feel bad    Histories Past Medical History:  Diagnosis Date   Allergy    Anxiety    Arthritis    Chest pain    a. 03/2015 Lexiscan  MV: EF 59%, no ischemia/infarct.   Colon polyps    COVID-19 virus infection    09-22-2020, 10/2022 fully vax'd   Depression    Family history of adverse reaction to anesthesia    daughter has PONV   GERD (gastroesophageal reflux disease)    Heart murmur    Hemorrhoids    Hyperlipidemia    Hypertension    Moderate aortic insufficiency    a. 03/2015 Echo: EF 55-60%, no rwma, mild LVH, Gr 1 DD, mild AS, mod AI (bicuspid AoV by report, poorly visualized on this study), Asc Ao diam 44mm, triv MR.   Past Surgical History:  Procedure Laterality Date   AORTIC VALVE REPLACEMENT N/A 10/30/2023   Procedure: AORTIC VALVE REPLACEMENT (AVR) USING INSPIRIS AORTIC VALVE SIZE ; SEPTAL MYECTOMY EXCISION;  Surgeon: Kerrin Elspeth BROCKS, MD;  Location: [facility] OR;  Service: Open Heart Surgery;  Laterality: N/A;   CARDIAC CATHETERIZATION N/A 07/24/2016   Procedure: Left Heart Cath and Coronary Angiography;  Surgeon: Peter M Swaziland, MD;  Location: [facility] INVASIVE CV LAB;  Service: Cardiovascular;  Laterality: N/A;   COLONOSCOPY     HAND DEBRIDEMENT  07/2022   right hand   NERVE, TENDON AND ARTERY REPAIR Right 08/07/2022   Procedure: NERVE, TENDON AND ARTERY REPAIR;  Surgeon: Murrell Drivers, MD;  Location: [facility] OR;  Service: Orthopedics;  Laterality: Right;   POLYPECTOMY     RIGHT HEART CATH AND CORONARY ANGIOGRAPHY N/A 10/03/2023   Procedure: RIGHT HEART CATH AND CORONARY ANGIOGRAPHY;  Surgeon: Swaziland, Peter M, MD;  Location: [facility] INVASIVE CV LAB;  Service: Cardiovascular;  Laterality: N/A;   TEE WITHOUT CARDIOVERSION N/A 10/30/2023   Procedure: TRANSESOPHAGEAL ECHOCARDIOGRAM (TEE);  Surgeon: Kerrin Elspeth BROCKS, MD;   Location: [facility] OR;  Service: Open Heart Surgery;  Laterality: N/A;   THORACIC AORTIC ANEURYSM REPAIR N/A 10/30/2023   Procedure: ASCENDING AORTIC ANEURYSM REPAIR (AAA) USING HEMASHIELD PLATINUM GRAFT SIZE ;  Surgeon: Kerrin Elspeth BROCKS, MD;  Location: [facility] OR;  Service: Open Heart Surgery;  Laterality: N/A;   THORACIC AORTOGRAM N/A 10/03/2023   Procedure: THORACIC AORTOGRAM;  Surgeon: Swaziland, Peter M, MD;  Location: [facility] INVASIVE CV LAB;  Service: Cardiovascular;  Laterality: N/A;   TONSILLECTOMY AND ADENOIDECTOMY     TUBAL LIGATION     WOUND EXPLORATION Right 08/07/2022   Procedure: WOUND EXPLORATION RIGHT WRIST;  Surgeon: Murrell Drivers, MD;  Location: [facility] OR;  Service: Orthopedics;  Laterality: Right;  60 MIN   Social History   Socioeconomic History   Marital status: Married    Spouse name: Not on file   Number of children: 2   Years of education: Not on file   Highest education level: Not on file  Occupational  History   Occupation: retired Runner, broadcasting/film/video    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  Tobacco Use   Smoking status: Former    Current packs/day: 0.50    Average packs/day: 0.5 packs/day for 15.0 years (7.5 ttl pk-yrs)    Types: Cigarettes   Smokeless tobacco: Never  Vaping Use   Vaping status: Never Used  Substance and Sexual Activity   Alcohol  use: Yes    Alcohol /week: 0.0 standard drinks of alcohol     Comment: 2-3 per day   Drug use: No   Sexual activity: Not Currently  Other Topics Concern   Not on file  Social History Narrative   Not on file   Social Drivers of Health   Financial Resource Strain: Not on file  Food Insecurity: No Food Insecurity (10/31/2023)   Hunger Vital Sign    Worried About Running Out of Food in the Last Year: Never true    Ran Out of Food in the Last Year: Never true  Transportation Needs: No Transportation Needs (10/31/2023)   PRAPARE - Administrator, Civil Service (Medical): No    Lack of Transportation (Non-Medical):  No  Physical Activity: Not on file  Stress: Not on file  Social Connections: Socially Integrated (10/31/2023)   Social Connection and Isolation Panel    Frequency of Communication with Friends and Family: Never    Frequency of Social Gatherings with Friends and Family: More than three times a week    Attends Religious Services: More than 4 times per year    Active Member of [employer] or Organizations: Yes    Attends Engineer, structural: More than 4 times per year    Marital Status: Married  Catering manager Violence: Not At Risk (10/31/2023)   Humiliation, Afraid, Rape, and Kick questionnaire    Fear of Current or Ex-Partner: No    Emotionally Abused: No    Physically Abused: No    Sexually Abused: No   Family History  Problem Relation Age of Onset   Heart attack Father        Died @ 68.  Had CABG in his 62's.   Heart disease Father    HIV Brother    Stomach cancer Maternal Grandmother    Colon cancer Maternal Grandmother    Diverticulosis Maternal Grandfather    Stroke Paternal Grandfather    Hypertension Neg Hx    Esophageal cancer Neg Hx    Rectal cancer Neg Hx    Breast cancer Neg Hx    Inflammatory bowel disease Neg Hx    Liver disease Neg Hx    Pancreatic cancer Neg Hx    I have reviewed her medical, social, and family history in detail and updated the electronic medical record as necessary.    PHYSICAL EXAMINATION  BP 126/68   Pulse 65   Ht 5' 3 (1.6 m)   Wt 120 lb (54.4 kg)   BMI 21.26 kg/m  Wt Readings from Last 3 Encounters:  04/10/24 120 lb (54.4 kg)  03/17/24 122 lb 6.4 oz (55.5 kg)  02/26/24 125 lb 10.6 oz (57 kg)  GEN: NAD, appears stated age, doesn't appear chronically ill, friend accompanying her today PSYCH: Cooperative, without pressured speech EYE: Conjunctivae pink, sclerae anicteric ENT: MMM CV: Nontachycardic RESP: No audible wheezing GI: NABS, soft, NT/ND, without rebound MSK/EXT: No significant LE edema SKIN: No jaundice NEURO:   Alert & Oriented x 3, no focal deficits   REVIEW OF DATA  I reviewed  the following data at the time of this encounter:  GI Procedures and Studies  No new procedures to review  Laboratory Studies  Reviewed those in epic  Imaging Studies  Today we reviewed the August 2024 MRI/MRCP IMPRESSION: 1. Multilobulated cystic lesion in the superior pancreatic head measuring 1.9 x 1.4 x 0.8 cm and communicating with the main pancreatic duct. No solid component or suspicious contrast enhancement. When accounting for slice selection and measuring similarly, this is not significantly changed in size on examinations dating back to 05/05/2021. Pancreatic duct is mildly prominent and there is mild atrophy of the pancreatic head and neck, these findings similar to recent prior examination although progressed compared to prior examinations dated 2022. This is most consistent with an IPMN and almost certainly benign given size smaller than 2 cm and established imaging stability. However, consider EUS/FNA for further assessment given progression of pancreatic atrophy and pancreatic ductal prominence as well as potentially referable signs and symptoms. 2. Cardiomegaly.   ASSESSMENT  Ms. Skyles is a 78 y.o. female.  The patient is seen today for evaluation and management of:  1. Pancreatic cyst   2. Belching   3. Small intestinal bacterial overgrowth (SIBO)    The patient is clinically and hemodynamically stable at this time.  We discussed the indication for follow-up/surveillance of her pancreatic cyst.  When we had last spoken with her we have decided to move forward with upper EUS.  She has concerns in regards to anesthesia risk.  The risks of an EUS including intestinal perforation, bleeding, infection, aspiration, and medication effects were discussed as was the possibility it may not give a definitive diagnosis if a biopsy is performed.  When a biopsy of the pancreas is done as part of the EUS,  there is an additional risk of pancreatitis at the rate of about 1-2%.  It was explained that procedure related pancreatitis is typically mild, although it can be severe and even life threatening, which is why we do not perform random pancreatic biopsies and only biopsy a lesion/area we feel is concerning enough to warrant the risk.  The patient would like to move forward with noninvasive imaging first before undergoing anesthesia again.  We will cancel her upper EUS next week and proceed with MRI/MRCP in the next few weeks.  Patient aware that if her MRI/MRCP shows evidence of significant change from last year, EUS will be rerecommended.  She agrees with this plan of action.  We will repeat fecal elastase testing to see if PERT therapy may be required in the setting of her pancreatic atrophy, though seems to be doing well with her current bowel regimen and fiber intake.  All patient questions were answered to the best of my ability, and the patient agrees to the aforementioned plan of action with follow-up as indicated.   PLAN  Cancel upper EUS for next week MRI/MRCP Based on results of imaging, will determine surveillance follow-up of pancreatic cyst versus upper EUS for aspiration attempt Repeat fecal elastase and fecal fat to evaluate in setting of patient's pancreatic atrophy need for potential PERT therapy Continue current fiber intake   Orders Placed This Encounter  Procedures   MR ABDOMEN MRCP W WO CONTAST   Pancreatic elastase, fecal   Fecal fat, qualitative    New Prescriptions   No medications on file   Modified Medications   No medications on file    Planned Follow Up No follow-ups on file.   Total Time in Face-to-Face and  in Coordination of Care for patient including independent/personal interpretation/review of prior testing, medical history, examination, medication adjustment, communicating results with the patient directly, and documentation within the EHR is 25  minutes.   Aloha Finner, MD  Gastroenterology [facility]oscopy Office # [phone]

## 2024-04-11 ENCOUNTER — Encounter: Payer: Self-pay | Admitting: Gastroenterology

## 2024-04-11 DIAGNOSIS — R142 Eructation: Secondary | ICD-10-CM | POA: Insufficient documentation

## 2024-04-13 ENCOUNTER — Other Ambulatory Visit

## 2024-04-13 DIAGNOSIS — R195 Other fecal abnormalities: Secondary | ICD-10-CM | POA: Diagnosis not present

## 2024-04-13 DIAGNOSIS — R142 Eructation: Secondary | ICD-10-CM | POA: Diagnosis not present

## 2024-04-13 DIAGNOSIS — K862 Cyst of pancreas: Secondary | ICD-10-CM | POA: Diagnosis not present

## 2024-04-14 ENCOUNTER — Ambulatory Visit (HOSPITAL_COMMUNITY): Admission: RE | Admit: 2024-04-14 | Source: Home / Self Care | Admitting: Gastroenterology

## 2024-04-14 ENCOUNTER — Encounter (HOSPITAL_COMMUNITY): Admission: RE | Payer: Self-pay | Source: Home / Self Care

## 2024-04-14 SURGERY — ULTRASOUND, UPPER GI TRACT, ENDOSCOPIC
Anesthesia: Monitor Anesthesia Care

## 2024-04-15 LAB — CALPROTECTIN, FECAL: Calprotectin, Fecal: 88 ug/g (ref 0–120)

## 2024-04-16 ENCOUNTER — Other Ambulatory Visit: Payer: Self-pay

## 2024-04-16 ENCOUNTER — Ambulatory Visit: Payer: Self-pay | Admitting: Gastroenterology

## 2024-04-16 DIAGNOSIS — K8689 Other specified diseases of pancreas: Secondary | ICD-10-CM

## 2024-04-16 DIAGNOSIS — R140 Abdominal distension (gaseous): Secondary | ICD-10-CM

## 2024-04-16 DIAGNOSIS — R195 Other fecal abnormalities: Secondary | ICD-10-CM

## 2024-04-16 LAB — FECAL FAT, QUALITATIVE
Fat Qual Neutral, Stl: NORMAL
Fat Qual Total, Stl: NORMAL

## 2024-04-17 ENCOUNTER — Other Ambulatory Visit: Payer: Self-pay | Admitting: Gastroenterology

## 2024-04-17 ENCOUNTER — Ambulatory Visit (HOSPITAL_COMMUNITY)
Admission: RE | Admit: 2024-04-17 | Discharge: 2024-04-17 | Disposition: A | Discharge status: Home / Self Care | Source: Ambulatory Visit | Attending: Gastroenterology | Admitting: Gastroenterology

## 2024-04-17 DIAGNOSIS — K859 Acute pancreatitis without necrosis or infection, unspecified: Secondary | ICD-10-CM | POA: Diagnosis not present

## 2024-04-17 DIAGNOSIS — R142 Eructation: Secondary | ICD-10-CM | POA: Diagnosis not present

## 2024-04-17 DIAGNOSIS — K8689 Other specified diseases of pancreas: Secondary | ICD-10-CM | POA: Diagnosis not present

## 2024-04-17 DIAGNOSIS — K862 Cyst of pancreas: Secondary | ICD-10-CM

## 2024-04-17 DIAGNOSIS — K838 Other specified diseases of biliary tract: Secondary | ICD-10-CM | POA: Diagnosis not present

## 2024-04-17 MED ORDER — GADOBUTROL 1 MMOL/ML IV SOLN
6.0000 mL | Freq: Once | INTRAVENOUS | Status: AC | PRN
  Administered 2024-04-17: 6 mL via INTRAVENOUS

## 2024-04-20 LAB — PANCREATIC ELASTASE, FECAL: Pancreatic Elastase-1, Stool: >800 ug/g (ref >200)

## 2024-05-18 ENCOUNTER — Telehealth: Payer: Self-pay | Admitting: Cardiology

## 2024-05-18 MED ORDER — AMOXICILLIN 500 MG PO CAPS: ORAL_CAPSULE | ORAL | 11 refills | Status: AC

## 2024-05-18 NOTE — Telephone Encounter (Signed)
 Pt's medication was sent to pt's pharmacy as requested. Confirmation received.

## 2024-05-18 NOTE — Telephone Encounter (Signed)
*  STAT* If patient is at the pharmacy, call can be transferred to refill team.   1. Which medications need to be refilled? (please list name of each medication and dose if known) amoxicillin  (AMOXIL ) 500 MG capsule    2. Would you like to learn more about the convenience, safety, & potential cost savings by using the [facility]? No   3. Are you open to using the [facility] (Type [facility]. No   4. Which pharmacy/location (including street and city if local pharmacy) is medication to be sent to? [id number] - [place], [place] - [address] AT [facility] OF [address]     5. Do they need a 30 day or 90 day supply?

## 2024-06-29 ENCOUNTER — Telehealth: Payer: Self-pay | Admitting: Gastroenterology

## 2024-06-29 ENCOUNTER — Other Ambulatory Visit: Payer: Self-pay | Admitting: Physician Assistant

## 2024-06-29 ENCOUNTER — Other Ambulatory Visit: Payer: Self-pay

## 2024-06-29 MED ORDER — PANTOPRAZOLE SODIUM 40 MG PO TBEC: 40.0000 mg | DELAYED_RELEASE_TABLET | Freq: Two times a day (BID) | ORAL | 2 refills | Status: AC

## 2024-06-29 NOTE — Telephone Encounter (Signed)
Refill for Pantoprazole sent to pharmacy.

## 2024-06-29 NOTE — Telephone Encounter (Signed)
 Inbound call from patient stating she is out of medication pantoprazole , would like a refill to be sent was last seen 04/10/24.  Please advise  Thank you

## 2024-07-02 DIAGNOSIS — R509 Fever, unspecified: Secondary | ICD-10-CM | POA: Diagnosis not present

## 2024-07-02 DIAGNOSIS — R52 Pain, unspecified: Secondary | ICD-10-CM | POA: Diagnosis not present

## 2024-07-02 DIAGNOSIS — R5383 Other fatigue: Secondary | ICD-10-CM | POA: Diagnosis not present

## 2024-07-02 DIAGNOSIS — Z1152 Encounter for screening for COVID-19: Secondary | ICD-10-CM | POA: Diagnosis not present

## 2024-07-02 DIAGNOSIS — R0981 Nasal congestion: Secondary | ICD-10-CM | POA: Diagnosis not present

## 2024-07-02 DIAGNOSIS — J069 Acute upper respiratory infection, unspecified: Secondary | ICD-10-CM | POA: Diagnosis not present

## 2024-07-02 DIAGNOSIS — R051 Acute cough: Secondary | ICD-10-CM | POA: Diagnosis not present

## 2024-08-11 DIAGNOSIS — F331 Major depressive disorder, recurrent, moderate: Secondary | ICD-10-CM | POA: Diagnosis not present

## 2024-09-17 ENCOUNTER — Encounter: Payer: Self-pay | Admitting: Cardiology

## 2024-09-29 ENCOUNTER — Other Ambulatory Visit (HOSPITAL_COMMUNITY): Payer: Self-pay | Admitting: Internal Medicine

## 2024-09-29 DIAGNOSIS — M810 Age-related osteoporosis without current pathological fracture: Secondary | ICD-10-CM | POA: Insufficient documentation

## 2024-09-29 NOTE — Progress Notes (Signed)
 Rceived updated Prolia  referral  Last dose: on 03/30/2024  Next dose is due 09/30/2024  Calcium  on 01/03/2024 wnl. Faxed Dr. Murrel office for more UTD calcium  lab  Sherry Pennant, PharmD, MPH, BCPS, CPP Clinical Pharmacist

## 2024-09-30 ENCOUNTER — Telehealth (HOSPITAL_COMMUNITY): Payer: Self-pay | Admitting: Pharmacy Technician

## 2024-09-30 ENCOUNTER — Other Ambulatory Visit (HOSPITAL_COMMUNITY): Payer: Self-pay | Admitting: Internal Medicine

## 2024-09-30 NOTE — Progress Notes (Signed)
 Received response stating patient has not had calcium  labs since April 2025. Called office to request they coordinate this with patient prior to next Prolia .  Sherry Pennant, PharmD, MPH, BCPS, CPP Clinical Pharmacist

## 2024-09-30 NOTE — Telephone Encounter (Signed)
 NOTE: Per [facility] practice, MD approved biosimilars switch for Prolia  per payor preferences.  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

## 2024-10-09 ENCOUNTER — Encounter: Payer: Self-pay | Admitting: Physician Assistant

## 2024-10-09 ENCOUNTER — Other Ambulatory Visit

## 2024-10-09 ENCOUNTER — Ambulatory Visit: Admitting: Physician Assistant

## 2024-10-09 VITALS — BP 90/56 | HR 60 | Ht 63.0 in | Wt 122.0 lb

## 2024-10-09 DIAGNOSIS — K862 Cyst of pancreas: Secondary | ICD-10-CM | POA: Diagnosis not present

## 2024-10-09 DIAGNOSIS — K5900 Constipation, unspecified: Secondary | ICD-10-CM

## 2024-10-09 NOTE — Patient Instructions (Addendum)
 Your provider has requested that you go to the basement level for lab work before leaving today. Press B on the elevator. The lab is located at the first door on the left as you exit the elevator.  Start Miralax 1 capful daily as needed in 8 ounces of liquid.   _______________________________________________________  If your blood pressure at your visit was 140/90 or greater, please contact your primary care physician to follow up on this.  _______________________________________________________  If you are age 48 or older, your body mass index should be between 23-30. Your Body mass index is 21.61 kg/m. If this is out of the aforementioned range listed, please consider follow up with your Primary Care Provider.  If you are age 20 or younger, your body mass index should be between 19-25. Your Body mass index is 21.61 kg/m. If this is out of the aformentioned range listed, please consider follow up with your Primary Care Provider.   ________________________________________________________  The [facility] providers would like to encourage you to use MYCHART to communicate with providers for non-urgent requests or questions.  Due to long hold times on the telephone, sending your provider a message by [facility] may be a faster and more efficient way to get a response.  Please allow 48 business hours for a response.  Please remember that this is for non-urgent requests.  _______________________________________________________  [facility] is using a team-based approach to care.  Your team is made up of your doctor and two to three APPS. Our APPS (Nurse Practitioners and Physician Assistants) work with your physician to ensure care continuity for you. They are fully qualified to address your health concerns and develop a treatment plan. They communicate directly with your gastroenterologist to care for you. Seeing the Advanced Practice Practitioners on your physician's team can help you  by facilitating care more promptly, often allowing for earlier appointments, access to diagnostic testing, procedures, and other specialty referrals.

## 2024-10-10 NOTE — Progress Notes (Signed)
 Attending Physician's Attestation   I have reviewed the chart.   I agree with the Advanced Practitioner's note, impression, and recommendations with any updates as below.    Corliss Parish, MD [facility] Office # [phone]

## 2024-10-15 NOTE — Progress Notes (Signed)
 Left another VM with [facility] for labs.  Sherry Pennant, PharmD, MPH, BCPS, CPP Clinical Pharmacist
